# Patient Record
Sex: Female | Born: 1935
Health system: Southern US, Community
[De-identification: ages and names within clinical notes are randomized; demographics above are authoritative.]

## PROBLEM LIST (undated history)

## (undated) DIAGNOSIS — I219 Acute myocardial infarction, unspecified: Secondary | ICD-10-CM

## (undated) DIAGNOSIS — M810 Age-related osteoporosis without current pathological fracture: Secondary | ICD-10-CM

## (undated) DIAGNOSIS — J449 Chronic obstructive pulmonary disease, unspecified: Secondary | ICD-10-CM

## (undated) DIAGNOSIS — E550 Rickets, active: Secondary | ICD-10-CM

## (undated) DIAGNOSIS — R259 Unspecified abnormal involuntary movements: Secondary | ICD-10-CM

## (undated) DIAGNOSIS — G252 Other specified forms of tremor: Secondary | ICD-10-CM

## (undated) DIAGNOSIS — I10 Essential (primary) hypertension: Secondary | ICD-10-CM

## (undated) DIAGNOSIS — K222 Esophageal obstruction: Secondary | ICD-10-CM

## (undated) DIAGNOSIS — I2510 Atherosclerotic heart disease of native coronary artery without angina pectoris: Secondary | ICD-10-CM

## (undated) DIAGNOSIS — E782 Mixed hyperlipidemia: Secondary | ICD-10-CM

## (undated) DIAGNOSIS — S329XXA Fracture of unspecified parts of lumbosacral spine and pelvis, initial encounter for closed fracture: Secondary | ICD-10-CM

## (undated) DIAGNOSIS — F419 Anxiety disorder, unspecified: Secondary | ICD-10-CM

## (undated) DIAGNOSIS — K219 Gastro-esophageal reflux disease without esophagitis: Secondary | ICD-10-CM

## (undated) HISTORY — DX: Esophageal obstruction: K22.2

## (undated) HISTORY — PX: VEIN LIGATION AND STRIPPING: SHX2653

## (undated) HISTORY — DX: Chronic obstructive pulmonary disease, unspecified: J44.9

## (undated) HISTORY — PX: CORONARY ANGIOPLASTY WITH STENT PLACEMENT: SHX49

## (undated) HISTORY — DX: Essential (primary) hypertension: I10

## (undated) HISTORY — DX: Unspecified abnormal involuntary movements: R25.9

## (undated) HISTORY — DX: Fracture of unspecified parts of lumbosacral spine and pelvis, initial encounter for closed fracture: S32.9XXA

## (undated) HISTORY — DX: Atherosclerotic heart disease of native coronary artery without angina pectoris: I25.10

## (undated) HISTORY — PX: OTHER SURGICAL HISTORY: SHX169

## (undated) HISTORY — DX: Anxiety disorder, unspecified: F41.9

## (undated) HISTORY — PX: CARDIAC CATHETERIZATION: SHX172

## (undated) HISTORY — DX: Age-related osteoporosis without current pathological fracture: M81.0

## (undated) HISTORY — DX: Other specified forms of tremor: G25.2

## (undated) HISTORY — PX: APPENDECTOMY: SHX54

## (undated) HISTORY — DX: Gastro-esophageal reflux disease without esophagitis: K21.9

## (undated) HISTORY — DX: Mixed hyperlipidemia: E78.2

## (undated) HISTORY — PX: CHOLECYSTECTOMY: SHX55

---

## 1998-06-29 ENCOUNTER — Other Ambulatory Visit: Admission: RE | Admit: 1998-06-29 | Discharge: 1998-06-29 | Payer: Self-pay | Admitting: Family Medicine

## 1999-10-03 ENCOUNTER — Other Ambulatory Visit: Admission: RE | Admit: 1999-10-03 | Discharge: 1999-10-03 | Payer: Self-pay | Admitting: Family Medicine

## 1999-10-31 ENCOUNTER — Other Ambulatory Visit: Admission: RE | Admit: 1999-10-31 | Discharge: 1999-10-31 | Payer: Self-pay | Admitting: Gastroenterology

## 1999-10-31 ENCOUNTER — Encounter (INDEPENDENT_AMBULATORY_CARE_PROVIDER_SITE_OTHER): Payer: Self-pay | Admitting: Specialist

## 2001-02-16 ENCOUNTER — Other Ambulatory Visit: Admission: RE | Admit: 2001-02-16 | Discharge: 2001-02-16 | Payer: Self-pay | Admitting: Family Medicine

## 2002-02-22 ENCOUNTER — Other Ambulatory Visit: Admission: RE | Admit: 2002-02-22 | Discharge: 2002-02-22 | Payer: Self-pay | Admitting: Family Medicine

## 2002-05-09 LAB — HM MAMMOGRAPHY: HM Mammogram: 0

## 2003-03-14 ENCOUNTER — Other Ambulatory Visit: Admission: RE | Admit: 2003-03-14 | Discharge: 2003-03-14 | Payer: Self-pay | Admitting: Family Medicine

## 2004-08-13 ENCOUNTER — Other Ambulatory Visit: Admission: RE | Admit: 2004-08-13 | Discharge: 2004-08-13 | Payer: Self-pay | Admitting: Family Medicine

## 2005-02-10 ENCOUNTER — Ambulatory Visit: Payer: Self-pay | Admitting: Gastroenterology

## 2005-03-24 LAB — HM DEXA SCAN

## 2005-03-31 ENCOUNTER — Ambulatory Visit: Payer: Self-pay | Admitting: Gastroenterology

## 2005-04-22 ENCOUNTER — Ambulatory Visit: Payer: Self-pay | Admitting: Gastroenterology

## 2005-11-18 ENCOUNTER — Ambulatory Visit: Payer: Self-pay | Admitting: Gastroenterology

## 2005-12-08 ENCOUNTER — Ambulatory Visit (HOSPITAL_COMMUNITY): Admission: RE | Admit: 2005-12-08 | Discharge: 2005-12-08 | Payer: Self-pay | Admitting: Gastroenterology

## 2005-12-22 ENCOUNTER — Ambulatory Visit (HOSPITAL_COMMUNITY): Admission: RE | Admit: 2005-12-22 | Discharge: 2005-12-22 | Payer: Self-pay | Admitting: Gastroenterology

## 2006-01-12 ENCOUNTER — Ambulatory Visit: Payer: Self-pay | Admitting: Gastroenterology

## 2006-02-02 ENCOUNTER — Ambulatory Visit: Payer: Self-pay | Admitting: Gastroenterology

## 2006-09-01 DIAGNOSIS — I219 Acute myocardial infarction, unspecified: Secondary | ICD-10-CM

## 2006-09-01 HISTORY — DX: Acute myocardial infarction, unspecified: I21.9

## 2007-04-03 ENCOUNTER — Ambulatory Visit: Payer: Self-pay | Admitting: Cardiology

## 2007-04-04 ENCOUNTER — Ambulatory Visit: Payer: Self-pay | Admitting: Cardiology

## 2007-04-04 ENCOUNTER — Inpatient Hospital Stay (HOSPITAL_COMMUNITY): Admission: AD | Admit: 2007-04-04 | Discharge: 2007-04-06 | Payer: Self-pay | Admitting: Cardiology

## 2007-04-12 ENCOUNTER — Ambulatory Visit: Payer: Self-pay | Admitting: Cardiology

## 2007-04-19 ENCOUNTER — Ambulatory Visit: Payer: Self-pay | Admitting: Cardiology

## 2007-08-02 ENCOUNTER — Ambulatory Visit: Payer: Self-pay | Admitting: Cardiology

## 2007-12-08 ENCOUNTER — Ambulatory Visit: Payer: Self-pay | Admitting: Cardiology

## 2007-12-09 ENCOUNTER — Inpatient Hospital Stay (HOSPITAL_COMMUNITY): Admission: AD | Admit: 2007-12-09 | Discharge: 2007-12-10 | Payer: Self-pay | Admitting: Cardiology

## 2007-12-09 ENCOUNTER — Ambulatory Visit: Payer: Self-pay | Admitting: Cardiology

## 2007-12-31 ENCOUNTER — Ambulatory Visit: Payer: Self-pay | Admitting: Cardiology

## 2007-12-31 ENCOUNTER — Encounter: Payer: Self-pay | Admitting: Physician Assistant

## 2008-07-25 ENCOUNTER — Ambulatory Visit: Payer: Self-pay | Admitting: Cardiology

## 2008-07-25 DIAGNOSIS — I2510 Atherosclerotic heart disease of native coronary artery without angina pectoris: Secondary | ICD-10-CM

## 2008-07-25 DIAGNOSIS — E785 Hyperlipidemia, unspecified: Secondary | ICD-10-CM | POA: Insufficient documentation

## 2008-11-06 ENCOUNTER — Encounter: Payer: Self-pay | Admitting: Cardiology

## 2008-12-17 ENCOUNTER — Encounter: Payer: Self-pay | Admitting: Cardiology

## 2008-12-19 ENCOUNTER — Encounter: Payer: Self-pay | Admitting: Cardiology

## 2008-12-21 ENCOUNTER — Encounter: Payer: Self-pay | Admitting: Cardiology

## 2009-01-01 ENCOUNTER — Ambulatory Visit: Payer: Self-pay | Admitting: Cardiology

## 2009-01-12 ENCOUNTER — Encounter: Payer: Self-pay | Admitting: Cardiology

## 2009-01-21 ENCOUNTER — Encounter: Payer: Self-pay | Admitting: Cardiology

## 2009-01-22 ENCOUNTER — Encounter: Payer: Self-pay | Admitting: Cardiology

## 2009-01-24 ENCOUNTER — Encounter: Payer: Self-pay | Admitting: Cardiology

## 2009-05-26 ENCOUNTER — Encounter: Payer: Self-pay | Admitting: Cardiology

## 2009-05-27 ENCOUNTER — Encounter: Payer: Self-pay | Admitting: Cardiology

## 2009-05-28 ENCOUNTER — Encounter: Payer: Self-pay | Admitting: Cardiology

## 2009-05-30 ENCOUNTER — Encounter: Payer: Self-pay | Admitting: Cardiology

## 2009-07-12 ENCOUNTER — Ambulatory Visit: Payer: Self-pay | Admitting: Cardiology

## 2009-07-12 DIAGNOSIS — I10 Essential (primary) hypertension: Secondary | ICD-10-CM | POA: Insufficient documentation

## 2009-09-03 ENCOUNTER — Encounter: Payer: Self-pay | Admitting: Cardiology

## 2009-10-09 ENCOUNTER — Encounter (INDEPENDENT_AMBULATORY_CARE_PROVIDER_SITE_OTHER): Payer: Self-pay | Admitting: *Deleted

## 2009-11-05 ENCOUNTER — Encounter: Payer: Self-pay | Admitting: Cardiology

## 2009-11-09 ENCOUNTER — Encounter: Payer: Self-pay | Admitting: Cardiology

## 2010-01-03 ENCOUNTER — Encounter: Payer: Self-pay | Admitting: Cardiology

## 2010-01-04 ENCOUNTER — Encounter (INDEPENDENT_AMBULATORY_CARE_PROVIDER_SITE_OTHER): Payer: Self-pay | Admitting: *Deleted

## 2010-01-17 ENCOUNTER — Encounter (INDEPENDENT_AMBULATORY_CARE_PROVIDER_SITE_OTHER): Payer: Self-pay | Admitting: *Deleted

## 2010-01-17 ENCOUNTER — Ambulatory Visit: Payer: Self-pay | Admitting: Cardiology

## 2010-01-21 ENCOUNTER — Telehealth (INDEPENDENT_AMBULATORY_CARE_PROVIDER_SITE_OTHER): Payer: Self-pay | Admitting: *Deleted

## 2010-01-25 ENCOUNTER — Ambulatory Visit: Payer: Self-pay | Admitting: Cardiology

## 2010-02-01 ENCOUNTER — Encounter (INDEPENDENT_AMBULATORY_CARE_PROVIDER_SITE_OTHER): Payer: Self-pay | Admitting: *Deleted

## 2010-02-01 ENCOUNTER — Encounter: Payer: Self-pay | Admitting: Cardiology

## 2010-02-01 ENCOUNTER — Ambulatory Visit: Payer: Self-pay | Admitting: Physician Assistant

## 2010-02-01 DIAGNOSIS — R9430 Abnormal result of cardiovascular function study, unspecified: Secondary | ICD-10-CM | POA: Insufficient documentation

## 2010-02-05 ENCOUNTER — Encounter (INDEPENDENT_AMBULATORY_CARE_PROVIDER_SITE_OTHER): Payer: Self-pay | Admitting: *Deleted

## 2010-02-07 ENCOUNTER — Inpatient Hospital Stay (HOSPITAL_BASED_OUTPATIENT_CLINIC_OR_DEPARTMENT_OTHER): Admission: RE | Admit: 2010-02-07 | Discharge: 2010-02-07 | Payer: Self-pay | Admitting: Cardiovascular Disease

## 2010-02-07 ENCOUNTER — Telehealth (INDEPENDENT_AMBULATORY_CARE_PROVIDER_SITE_OTHER): Payer: Self-pay | Admitting: *Deleted

## 2010-02-07 ENCOUNTER — Ambulatory Visit: Payer: Self-pay | Admitting: Cardiovascular Disease

## 2010-02-27 ENCOUNTER — Ambulatory Visit: Payer: Self-pay | Admitting: Cardiology

## 2010-02-27 DIAGNOSIS — K219 Gastro-esophageal reflux disease without esophagitis: Secondary | ICD-10-CM

## 2010-04-03 ENCOUNTER — Telehealth (INDEPENDENT_AMBULATORY_CARE_PROVIDER_SITE_OTHER): Payer: Self-pay | Admitting: *Deleted

## 2010-05-16 ENCOUNTER — Encounter: Payer: Self-pay | Admitting: Cardiology

## 2010-05-17 ENCOUNTER — Encounter: Payer: Self-pay | Admitting: Cardiology

## 2010-05-20 ENCOUNTER — Ambulatory Visit: Payer: Self-pay | Admitting: Cardiology

## 2010-06-19 ENCOUNTER — Telehealth: Payer: Self-pay | Admitting: Gastroenterology

## 2010-06-24 ENCOUNTER — Encounter (INDEPENDENT_AMBULATORY_CARE_PROVIDER_SITE_OTHER): Payer: Self-pay | Admitting: *Deleted

## 2010-09-22 ENCOUNTER — Encounter: Payer: Self-pay | Admitting: Gastroenterology

## 2010-10-01 NOTE — Progress Notes (Signed)
Summary: PHONE:  BLOOD WORK  Phone Note Call from Patient Call back at Home Phone [phone]   Caller: self Summary of Call: Patient has appt. with Dr. Diona Browner on Sept. 19th and she is wanting to know if she needs blood work before exam. States that she normally gets it when she goes to [place] but they did not order any on her last visit. Initial call taken by: Zachary George,  April 03, 2010 2:23 PM  Follow-up for Phone Call        There is no mention in last OV note and no reminder regarding labs due before office visit. Will assess at ov with Dr. Diona Browner. Follow-up by: Cyril Loosen, RN, BSN,  April 03, 2010 4:20 PM

## 2010-10-01 NOTE — Letter (Signed)
Summary: [facility] D/C DR. VASIREDDY  [facility] D/C DR. VASIREDDY   Imported By: Zachary George 05/20/2010 10:54:23  _____________________________________________________________________  External Attachment:    Type:   Image     Comment:   External Document

## 2010-10-01 NOTE — Letter (Signed)
Summary: Cardiac Cath Instructions - [facility]  [facility] [address]   [place], [place] [address]   Phone: [phone]  Fax: [phone]     02/01/2010 MRN: [id number]  [facility] [address] [place], [place]  [address]  Dear [name],   You are scheduled for a Cardiac Catheterization on THURSDAY, February 07, 2010 with Dr. Clifton James.  Please arrive to the 1st floor of the Heart and Vascular Center at [facility] at 8:30 am on the day of your procedure. Please do not arrive before 6:30 a.m. Call the Heart and Vascular Center at [phone] if you are unable to make your appointmnet. The Code to get into the parking garage under the building is 0100. Take the elevators to the 1st floor. You must have someone to drive you home. Someone must be with you for the first 24 hours after you arrive home. Please wear clothes that are easy to get on and off and wear slip-on shoes. Do not eat or drink after midnight except water with your medications that morning. Bring all your medications and current insurance cards with you.  _X__ DO NOT take these medications before your procedure: Hold Lisinopril the morning of the test.  __X_ Make sure you take your aspirin and Plavix.  _X__ You may take all of your remaining medications with water that morning.  ___ DO NOT take ANY medications before your procedure.  ___ Pre-med instructions:  ________________________________________________________________________  The usual length of stay after your procedure is 2 to 3 hours. This can vary.  If you have any questions, please call the office at the number listed above.  Cyril Loosen, RN, BSN           Directions to the [facility]  Please Note : Park in [place] under the building not the parking deck.  From [employer]: Turn onto [address] Left onto [place] (1st stoplight) Right at the brick  entrance to the hospital (Main circle [address]) Bear to the right and you will see a blue sign "Heart and Vascular Center" Parking garage is a sharp right'to get through the gate out in the code _0100______. Once you park, take the elevator to the first floor. Please do not arrive before 0630am. The building will be dark before that time.   From [address] Turn onto [address] Turn left into the brick entrance to the hospital (Main circle drive) Bear to the right and you will see a blue sign "Heart and Vascular Center" Parking garage is a sharp right, to get thru the gate put in the code _0100___. Once you park, take the elevator to the first floor. Please do not arrive before 0630am. The building will be dark before that time

## 2010-10-01 NOTE — Letter (Signed)
Summary: External Correspondence/ FAXED PRE-CATH ORDER  External Correspondence/ FAXED PRE-CATH ORDER   Imported By: Dorise Hiss 02/11/2010 12:35:47  _____________________________________________________________________  External Attachment:    Type:   Image     Comment:   External Document

## 2010-10-01 NOTE — Assessment & Plan Note (Signed)
Summary: 6 MO FO PER MAY REMINDER-SRS   Visit Type:  Follow-up Primary Provider:  [facility]   History of Present Illness: 77 year old woman presents for followup.  She was hospitalized at [facility] back in March with a complicated urinary tract infection. She reports having problems with recurrent infection, and has been on at least 2 courses of antibiotics. She states that she is due to see her primary care physician later today.  Followup labs from March revealed BUN 15, creatinine 1.1, potassium 4.5, AST 16, ALT 8, total cholesterol 148, triglycerides 97, HDL of 61, LDL 68.  From a cardiac perspective, she does state that she has had to use a nitroglycerin recently. She describes a vague discomfort in her chest, not always exertional, but definitely more present for the last few months. Symptoms generally fairly mild, somewhat moderate at times. She reports NYHA class II dyspnea, no palpitations or syncope.  Current Medications (verified): 1)  Isosorbide Mononitrate Cr 30 Mg Xr24h-Tab (Isosorbide Mononitrate) .... Take 1 Tablet By Mouth Once A Day 2)  Lisinopril 10 Mg Tabs (Lisinopril) .... Take 1 Tablet By Mouth Once A Day 3)  Nitrostat 0.4 Mg Subl (Nitroglycerin) .... Place 1 Tablet Under Tongue As Directed 4)  Simvastatin 40 Mg Tabs (Simvastatin) .... Take 1 Tablet By Mouth At Bedtime 5)  Metoprolol Succinate 25 Mg Xr24h-Tab (Metoprolol Succinate) .... Take 1 Tablet By Mouth Once A Day 6)  Plavix 75 Mg Tabs (Clopidogrel Bisulfate) .... Take 1 Tablet By Mouth Once A Day 7)  Aspirin 325 Mg Tabs (Aspirin) .... Take 1 Tablet By Mouth Once A Day 8)  Alprazolam 0.25 Mg Tabs (Alprazolam) .... As Needed 9)  Prilosec 20 Mg Cpdr (Omeprazole) .... As Needed  Allergies (verified): 1)  ! Pcn 2)  ! Codeine  Comments:  Nurse/Medical Assistant: The patient's medications and allergies were reviewed with the patient and were updated in the Medication and Allergy Lists.  Bottle reviewed.  Past History:  Social History: Last updated: 01/17/2010 Widowed  Tobacco Use - Former Alcohol Use - no  Past Medical History: Coronary artery disease - DES LAD and BMS LAD 8/08 Dyslipidemia.  Gastroesophageal reflux disease Hypertension Anxiety disorder Resting tremor  Social History: Widowed  Tobacco Use - Former Alcohol Use - no  Review of Systems       The patient complains of chest pain.  The patient denies anorexia, fever, vision loss, syncope, dyspnea on exertion, peripheral edema, prolonged cough, headaches, hemoptysis, melena, and hematochezia.         Otherwise reviewed and negative except as outlined above.  Vital Signs:  Patient profile:   77 year old female Height:      63 inches Weight:      166 pounds Pulse rate:   52 / minute BP sitting:   150 / 72  (left arm) Cuff size:   regular  Vitals Entered By: Carlye Grippe (Jan 17, 2010 10:16 AM)  Physical Exam  Additional Exam:  Overweight woman in no acute distress. HEENT: Conjunctiva and lids normal, oropharynx clear. Neck: Supple, no elevated sugar venous pressure, no bruits. Lungs: Clear to auscultation, nonlabored. Cardiac: Regular rate and rhythm, no S3 gallop. Abdomen: Soft, nontender, no bruits. Skin: Warm and dry. Extremities: No pitting edema, distal pulses one plus. Musculoskeletal: No kyphosis. Neuropsychiatric: Alert and oriented x3, resting tremor, affect normal.   EKG  Procedure date:  01/17/2010  Findings:      Sinus bradycardia at 50 beats per minute.  Impression & Recommendations:  Problem # 1:  CORONARY ATHEROSCLEROSIS NATIVE CORONARY ARTERY (ICD-414.01)  Patient is reporting some new symptoms, specifically chest discomfort, and occasional use of nitroglycerin which is new over the last year. She is status post intervention to the LAD back in August 2008 as noted above. She was given refill for nitroglycerin, started on Imdur at 30 mg daily, and will be  scheduled for a Lexiscan Cardiolite on medical therapy to assess for residual ischemia. I will see her back in the office to discuss the results.  Her updated medication list for this problem includes:    Isosorbide Mononitrate Cr 30 Mg Xr24h-tab (Isosorbide mononitrate) .Marland Kitchen... Take 1 tablet by mouth once a day    Lisinopril 10 Mg Tabs (Lisinopril) .Marland Kitchen... Take 1 tablet by mouth once a day    Nitrostat 0.4 Mg Subl (Nitroglycerin) .Marland Kitchen... Place 1 tablet under tongue as directed    Metoprolol Succinate 25 Mg Xr24h-tab (Metoprolol succinate) .Marland Kitchen... Take 1 tablet by mouth once a day    Plavix 75 Mg Tabs (Clopidogrel bisulfate) .Marland Kitchen... Take 1 tablet by mouth once a day    Aspirin 325 Mg Tabs (Aspirin) .Marland Kitchen... Take 1 tablet by mouth once a day  Orders: EKG w/ Interpretation (93000) Nuclear Med (Nuc Med)  Problem # 2:  HYPERLIPIDEMIA (ICD-272.4)  Lipid numbers look good on present regimen.  Her updated medication list for this problem includes:    Simvastatin 40 Mg Tabs (Simvastatin) .Marland Kitchen... Take 1 tablet by mouth at bedtime  Problem # 3:  ESSENTIAL HYPERTENSION, BENIGN (ICD-401.1)  Blood pressure up some today. I asked her to keep an eye on sodium intake.  Her updated medication list for this problem includes:    Lisinopril 10 Mg Tabs (Lisinopril) .Marland Kitchen... Take 1 tablet by mouth once a day    Metoprolol Succinate 25 Mg Xr24h-tab (Metoprolol succinate) .Marland Kitchen... Take 1 tablet by mouth once a day    Aspirin 325 Mg Tabs (Aspirin) .Marland Kitchen... Take 1 tablet by mouth once a day  Patient Instructions: 1)  Follow up appt with Dr. Diona Browner on Wednesday, February 27, 2010 at 9:10am. 2)  Start Imdur (isosorbide) 30mg  by mouth once daily. 3)  Your physician has requested that you have an Best boy.  For further information please visit [email or web address]  Please follow instruction sheet, as given. Prescriptions: NITROSTAT 0.4 MG SUBL (NITROGLYCERIN) Place 1 tablet under tongue as directed  #25 x 3   Entered by:    Cyril Loosen, RN, BSN   Authorized by:   Loreli Slot, MD, [facility]   Signed by:   Cyril Loosen, RN, BSN on 01/17/2010   Method used:   Electronically to        [facility]* (retail)       [address]       [place], [place]  [address]       Ph: [phone]       Fax: [phone]   RxID:   [id number] ISOSORBIDE MONONITRATE CR 30 MG XR24H-TAB (ISOSORBIDE MONONITRATE) Take 1 tablet by mouth once a day  #30 x 6   Entered by:   Cyril Loosen, RN, BSN   Authorized by:   Loreli Slot, MD, [facility]   Signed by:   Cyril Loosen, RN, BSN on 01/17/2010   Method used:   Electronically to        [facility]* (retail)       [address]  [place], [place]  [address]       Ph: [phone]       Fax: [phone]   RxID:   [id number]

## 2010-10-01 NOTE — Progress Notes (Signed)
Summary: Left message for nurse to call  Phone Note Call from Patient Call back at Home Phone [phone]   Summary of Call: Pt left message on my voicemail at [phone] to call regarding a question for Dr. Diona Browner. Attempted to reach pt. Left message to call back on machine. Initial call taken by: Cyril Loosen, RN, BSN,  Jan 21, 2010 4:29 PM  Follow-up for Phone Call        Pt states she's on Cipro for a UTI and wants to know if this is okay to take with the stress test. Notified pt it should not interfer with her test. Pt verbalized understanding.  Follow-up by: Cyril Loosen, RN, BSN,  Jan 22, 2010 8:12 AM

## 2010-10-01 NOTE — Letter (Signed)
Summary: Lexiscan or Dobutamine Pharmacist, community at [facility]  [address]   [place], [place] [address]   Phone: [phone]  Fax: [phone]      [facility] Cardi[facility] Services  Lexiscan or Dobutamine Cardiolite Strss Test    [name]  Appointment Date:_  Appointment Time:_  Your doctor has ordered a CARDIOLITE STRESS TEST using a medication to stimulate exercise so that you will not have to walk on the treadmill to determine the condition of your heart during stress. If you take blood pressure medication, ask your doctor if you should take it the day of your test. You should not have anything to eat or drink at least 4 hours before your test is scheduled, and no caffeine, including decaffeinated tea and coffee, chocolate, and soft drinks for 24 hours before your test.  You will need to register at the Outpatient/Main Entrance at the hospital 15 minutes before your appointment time. It is a good idea to bring a copy of your order with you. They will direct you to the Diagnostic Imaging (Radiology) Department.  You will be asked to undress from the waist up and given a hospital gown to wear, so dress comfortably from the waist down for example: Sweat pants, shorts, or skirt Rubber soled lace up shoes (tennis shoes)  Plan on about three hours from registration to release from the hospital  You may take all of your medications with a sip of water the morning of your test.

## 2010-10-01 NOTE — Letter (Signed)
Summary: Colonoscopy Letter  [facility]  [address] [place], [place] [address]   Phone: [phone]  Fax: [phone]      October 09, 2009 MRN: [id number]   [facility] [address] [place], [place]  [address]   Dear Ms. Dinger,   According to your medical record, it is time for you to schedule a Colonoscopy. The American Cancer Society recommends this procedure as a method to detect early colon cancer. Patients with a family history of colon cancer, or a personal history of colon polyps or inflammatory bowel disease are at increased risk.  This letter has beeen generated based on the recommendations made at the time of your procedure. If you feel that in your particular situation this may no longer apply, please contact our office.  Please call our office at [phone] to schedule this appointment or to update your records at your earliest convenience.  Thank you for cooperating with [place] to provide you with the very best care possible.   Sincerely,  Judie Petit T. Russella Dar, M.D.  [facility] Gastroenterology Division [phone]  Appended Document: Colonoscopy Letter mailed.

## 2010-10-01 NOTE — Assessment & Plan Note (Signed)
Summary: 1 MO F/U PER 5/19 OV-JM   Visit Type:  Follow-up Primary Provider:  [facility]   History of Present Illness: 77 year old woman presents for followup. She was recently referred for a followup cardiac catheterization secondary to chest discomfort concerning for angina. Results are outlined below, and fortunately were reassuring, demonstrating no major obstructive disease and continued stent patency. Reviewed this with her today.  She continues to have occasional chest "tightness", sometimes after walking in the heat, and sometimes at night. She also has concurrent reflux symptoms. We discussed some medication adjustments to see if we can get a better handle on her symptoms.  She states that she was very pleased with her experience an outpatient cardiac catheterization lab.  Preventive Screening-Counseling & Management  Alcohol-Tobacco     Smoking Status: quit     Year Quit: 1990  Current Medications (verified): 1)  Isosorbide Mononitrate Cr 30 Mg Xr24h-Tab (Isosorbide Mononitrate) .... Take 1 Tablet By Mouth Once A Day 2)  Lisinopril 10 Mg Tabs (Lisinopril) .... Take 1 Tablet By Mouth Once A Day 3)  Nitrostat 0.4 Mg Subl (Nitroglycerin) .... Place 1 Tablet Under Tongue As Directed 4)  Simvastatin 40 Mg Tabs (Simvastatin) .... Take 1 Tablet By Mouth At Bedtime 5)  Metoprolol Succinate 25 Mg Xr24h-Tab (Metoprolol Succinate) .... Take 1 Tablet By Mouth Once A Day 6)  Plavix 75 Mg Tabs (Clopidogrel Bisulfate) .... Take 1 Tablet By Mouth Once A Day 7)  Aspirin 325 Mg Tabs (Aspirin) .... Take 1 Tablet By Mouth Once A Day 8)  Alprazolam 0.25 Mg Tabs (Alprazolam) .... As Needed 9)  Prilosec 20 Mg Cpdr (Omeprazole) .... As Needed  Allergies (verified): 1)  ! Pcn 2)  ! Codeine  Comments:  Nurse/Medical Assistant: The patient's medication list and allergies were reviewed with the patient and were updated in the Medication and Allergy Lists.  Past  History:  Past Medical History: Last updated: 01/17/2010 Coronary artery disease - DES LAD and BMS LAD 8/08 Dyslipidemia.  Gastroesophageal reflux disease Hypertension Anxiety disorder Resting tremor  Social History: Last updated: 01/17/2010 Widowed  Tobacco Use - Former Alcohol Use - no  Review of Systems       The patient complains of chest pain.  The patient denies anorexia, fever, syncope, dyspnea on exertion, prolonged cough, hemoptysis, abdominal pain, melena, hematochezia, and severe indigestion/heartburn.         Otherwise reviewed and negative except as outlined.  Vital Signs:  Patient profile:   77 year old female Height:      63 inches Weight:      169 pounds Pulse rate:   64 / minute BP sitting:   125 / 74  (left arm) Cuff size:   large  Vitals Entered By: Carlye Grippe (February 27, 2010 8:52 AM)  Physical Exam  Additional Exam:  Overweight woman in no acute distress. HEENT: Conjunctiva and lids normal, oropharynx clear. Neck: Supple, no elevated sugar venous pressure, no bruits. Lungs: Clear to auscultation, nonlabored. Cardiac: Regular rate and rhythm, no S3 gallop. Abdomen: Soft, nontender, no bruits. Skin: Warm and dry. Extremities: No pitting edema, distal pulses one plus. Musculoskeletal: No kyphosis. Neuropsychiatric: Alert and oriented x3, resting tremor, affect normal.   Cardiac Cath  Procedure date:  02/07/2010  Findings:      HEMODYNAMIC FINDINGS:  Central aortic pressure 140/55.  Left ventricular   pressure 136/8.  Left ventricular end-diastolic pressure 19.      ANGIOGRAPHIC FINDINGS:   1. The  left main coronary artery had no evidence of disease.   2. The left anterior descending was a large vessel that coursed to the       apex.  There was a stent in the proximal to midportion of the       vessel that was widely patent and the distal portion of the vessel       that was widely patent.  There was a moderate-sized diagonal branch        that appears to have 40% stenosis.  There were no flow-limiting       lesions in this vessel.   3. The circumflex artery was a moderate-sized vessel that gives off 2       very small obtuse marginal branches and a moderate-sized third       obtuse marginal branch.  There are several areas in the mid vessel       that have 40% stenosis.   4. The right coronary artery was a large dominant vessel that appears       to have 40% stenosis in the midportion of vessel.  There were no       flow-limiting lesions in this vessel.   5. Left ventricular angiogram was performed in the RAO projection.  It       showed normal left ventricular systolic function with ejection       fraction of 55-60%.  Impression & Recommendations:  Problem # 1:  CORONARY ATHEROSCLEROSIS NATIVE CORONARY ARTERY (ICD-414.01)  Recent cardiac catheterization reviewed, and reassuring, with stent patency, and no recurrent major obstructive stenoses. No indication for revascularization. In light of symptoms of probable angina, will continue to modify medical therapy. Imdur will be increased to b.i.d. dosing. She will continue on dual antiplatelet therapy. I will see her back over the next 3 months.  Her updated medication list for this problem includes:    Isosorbide Mononitrate Cr 30 Mg Xr24h-tab (Isosorbide mononitrate) .Marland Kitchen... Take 1 tablet by mouth two times a day    Lisinopril 10 Mg Tabs (Lisinopril) .Marland Kitchen... Take 1 tablet by mouth once a day    Nitrostat 0.4 Mg Subl (Nitroglycerin) .Marland Kitchen... Place 1 tablet under tongue as directed    Metoprolol Succinate 25 Mg Xr24h-tab (Metoprolol succinate) .Marland Kitchen... Take 1 tablet by mouth once a day    Plavix 75 Mg Tabs (Clopidogrel bisulfate) .Marland Kitchen... Take 1 tablet by mouth once a day    Aspirin 325 Mg Tabs (Aspirin) .Marland Kitchen... Take 1 tablet by mouth once a day  Problem # 2:  ESSENTIAL HYPERTENSION, BENIGN (ICD-401.1)  Blood pressure well controlled.  Her updated medication list for this problem  includes:    Lisinopril 10 Mg Tabs (Lisinopril) .Marland Kitchen... Take 1 tablet by mouth once a day    Metoprolol Succinate 25 Mg Xr24h-tab (Metoprolol succinate) .Marland Kitchen... Take 1 tablet by mouth once a day    Aspirin 325 Mg Tabs (Aspirin) .Marland Kitchen... Take 1 tablet by mouth once a day  Problem # 3:  GERD (ICD-530.81)  May also be contributing to some of her symptoms. I encouraged her to try her proton pump inhibitor more regularly.  Her updated medication list for this problem includes:    Prilosec 20 Mg Cpdr (Omeprazole) .Marland Kitchen... As needed  Patient Instructions: 1)  Your physician wants you to follow-up in: 3 months. You will receive a reminder letter in the mail one-two months in advance. If you don't receive a letter, please call our office to schedule the follow-up appointment.  2)  Increase Imdur (isosorbide mononitrate) to 30mg  by mouth two times a day. A new prescription was sen to your pharmacy to reflect this change.  Prescriptions: ISOSORBIDE MONONITRATE CR 30 MG XR24H-TAB (ISOSORBIDE MONONITRATE) Take 1 tablet by mouth two times a day  #60 x 6   Entered by:   Cyril Loosen, RN, BSN   Authorized by:   Loreli Slot, MD, [facility]   Signed by:   Cyril Loosen, RN, BSN on 02/27/2010   Method used:   Electronically to        [facility]* (retail)       [address]       [place], [place]  [address]       Ph: [phone]       Fax: [phone]   RxID:   [id number]

## 2010-10-01 NOTE — Letter (Signed)
Summary: New Patient letter  [facility]  [address] [place], [place] [address]   Phone: [phone]  Fax: [phone]       06/24/2010 MRN: [id number]  [facility] [address] [place], [place]  [address]  Dear Ms. Ohnemus,  Welcome to the Gastroenterology Division at [facility].    You are scheduled to see Dr. Russella Dar on 08/05/2010 at 2:30PM on the 3rd floor at [facility], [address]  We ask that you try to arrive at our office 15 minutes prior to your appointment time to allow for check-in.  We would like you to complete the enclosed self-administered evaluation form prior to your visit and bring it with you on the day of your appointment.  We will review it with you.  Also, please bring a complete list of all your medications or, if you prefer, bring the medication bottles and we will list them.  Please bring your insurance card so that we may make a copy of it.  If your insurance requires a referral to see a specialist, please bring your referral form from your primary care physician.  Co-payments are due at the time of your visit and may be paid by cash, check or credit card.     Your office visit will consist of a consult with your physician (includes a physical exam), any laboratory testing he/she may order, scheduling of any necessary diagnostic testing (e.g. x-ray, ultrasound, CT-scan), and scheduling of a procedure (e.g. Endoscopy, Colonoscopy) if required.  Please allow enough time on your schedule to allow for any/all of these possibilities.    If you cannot keep your appointment, please call [phone] to cancel or reschedule prior to your appointment date.  This allows [place] the opportunity to schedule an appointment for another patient in need of care.  If you do not cancel or reschedule by 5 p.m. the business day prior to your appointment date, you will be charged a $50.00 late cancellation/no-show fee.    Thank you for choosing   Gastroenterology for your medical needs.  We appreciate the opportunity to care for you.  Please visit [place] at our website  to learn more about our practice.                     Sincerely,                                                             The Gastroenterology Division

## 2010-10-01 NOTE — Assessment & Plan Note (Signed)
Summary: FOLLOW UP CARDIOLITE, CHEST TIGHTNESS-JM   Visit Type:  Follow-up Primary Provider:  [facility]   History of Present Illness: 77 year old woman presents for followup. I referred her for a Cardiolite study following our last visit in mid May secondary to increased anginal chest pain. She was placed on empiric Imdur in addition to her regular medications. The Cardiolite study is reviewed below.  She denies any improvement in her chest pain symptoms.  She does use sublingual nitroglycerin with improvement. Her blood pressure and heart rate are well controlled on the present antianginal regimen, not leaving much room for further up titration.  I reviewed the stress test results with the patient and her granddaughter present. The implication is that she has had progressive atherosclerosis within the LAD distribution most likely, based on her history and prior stent placement in the LAD back in 2008.  We discussed options for management, including a repeat diagnostic cardiac catheterization to assess for potential revascularization options. After reviewing the risks and benefits, she is in agreement to proceed with this next week.  Preventive Screening-Counseling & Management  Alcohol-Tobacco     Smoking Status: quit     Year Quit: 1990  Current Medications (verified): 1)  Isosorbide Mononitrate Cr 30 Mg Xr24h-Tab (Isosorbide Mononitrate) .... Take 1 Tablet By Mouth Once A Day 2)  Lisinopril 10 Mg Tabs (Lisinopril) .... Take 1 Tablet By Mouth Once A Day 3)  Nitrostat 0.4 Mg Subl (Nitroglycerin) .... Place 1 Tablet Under Tongue As Directed 4)  Simvastatin 40 Mg Tabs (Simvastatin) .... Take 1 Tablet By Mouth At Bedtime 5)  Metoprolol Succinate 25 Mg Xr24h-Tab (Metoprolol Succinate) .... Take 1 Tablet By Mouth Once A Day 6)  Plavix 75 Mg Tabs (Clopidogrel Bisulfate) .... Take 1 Tablet By Mouth Once A Day 7)  Aspirin 325 Mg Tabs (Aspirin) .... Take 1 Tablet By  Mouth Once A Day 8)  Alprazolam 0.25 Mg Tabs (Alprazolam) .... As Needed 9)  Prilosec 20 Mg Cpdr (Omeprazole) .... As Needed  Allergies (verified): 1)  ! Pcn 2)  ! Codeine  Comments:  Nurse/Medical Assistant: The patient's medications list and allergies were reviewed with the patient and were updated in the Medication and Allergy Lists.  Past History:  Past Medical History: Last updated: 01/17/2010 Coronary artery disease - DES LAD and BMS LAD 8/08 Dyslipidemia.  Gastroesophageal reflux disease Hypertension Anxiety disorder Resting tremor  Past Surgical History: Last updated: 07/25/2008 Appendectomy Cholecystectomy Vein stripping  Family History: Last updated: 07/25/2008 No premature CAD  Social History: Last updated: 01/17/2010 Widowed  Tobacco Use - Former Alcohol Use - no   Review of Systems       The patient complains of chest pain and dyspnea on exertion.  The patient denies anorexia, fever, weight loss, syncope, peripheral edema, prolonged cough, headaches, melena, hematochezia, and severe indigestion/heartburn.         Otherwise reviewed and negative except as outlined.  Vital Signs:  Patient profile:   77 year old female Height:      63 inches Weight:      170 pounds Pulse rate:   53 / minute BP sitting:   114 / 59  (left arm) Cuff size:   large  Vitals Entered By: Carlye Grippe (February 01, 2010 2:34 PM)  Physical Exam  Additional Exam:  Overweight woman in no acute distress. HEENT: Conjunctiva and lids normal, oropharynx clear. Neck: Supple, no elevated sugar venous pressure, no bruits. Lungs: Clear to auscultation, nonlabored.  Cardiac: Regular rate and rhythm, no S3 gallop. Abdomen: Soft, nontender, no bruits. Skin: Warm and dry. Extremities: No pitting edema, distal pulses one plus. Musculoskeletal: No kyphosis. Neuropsychiatric: Alert and oriented x3, resting tremor, affect normal.   Nuclear Study  Procedure date:   01/25/2010  Findings:      Lexa scan Cardiolite with no diagnostic ST segment changes or chest pain. Mild anteroapical ischemia is possible in the setting of variable breast attenuation. LVEF 68% with TID ratio 1.12.  Impression & Recommendations:  Problem # 1:  CORONARY ATHEROSCLEROSIS NATIVE CORONARY ARTERY (ICD-414.01)  History of CAD status post drug-eluting stent placement and bare-metal stent placement to the LAD back in August 2008. Patient is describing progressive angina on good medical therapy, with good control of heart rate and blood pressure. Recent followup stress testing was abnormal, demonstrating possible anteroapical ischemia, and therefore potential progression of atherosclerosis or in-stent restenosis within the LAD. After discussing the risks and benefits, we are planning a diagnostic cardiac catheterization to best define her coronary anatomy, and to assess for potential revascularization options. This will be scheduled next week on Thursday at her request, preceded by baseline labs. If symptoms escalate in the meanwhile, she should be evaluated more urgently.  Her updated medication list for this problem includes:    Isosorbide Mononitrate Cr 30 Mg Xr24h-tab (Isosorbide mononitrate) .Marland Kitchen... Take 1 tablet by mouth once a day    Lisinopril 10 Mg Tabs (Lisinopril) .Marland Kitchen... Take 1 tablet by mouth once a day    Nitrostat 0.4 Mg Subl (Nitroglycerin) .Marland Kitchen... Place 1 tablet under tongue as directed    Metoprolol Succinate 25 Mg Xr24h-tab (Metoprolol succinate) .Marland Kitchen... Take 1 tablet by mouth once a day    Plavix 75 Mg Tabs (Clopidogrel bisulfate) .Marland Kitchen... Take 1 tablet by mouth once a day    Aspirin 325 Mg Tabs (Aspirin) .Marland Kitchen... Take 1 tablet by mouth once a day  Orders: Cardiac Catheterization (Cardiac Cath) T-Basic Metabolic Panel ([id number]) T-CBC No Diff ([id number]) T-Protime, Auto ([id number]) T-PTT ([id number])  Problem # 2:  CARDIOVASCULAR FUNCTION STUDY, ABNORMAL  (ICD-794.30)  As noted above, recent Lexiscan Cardiolite was abnormal in the setting of variable breast attenuation, revealing possible anteroapical ischemia, although preserved LVEF. This will be assessed further as described.  Problem # 3:  ESSENTIAL HYPERTENSION, BENIGN (ICD-401.1)  Blood pressure well controlled.  Her updated medication list for this problem includes:    Lisinopril 10 Mg Tabs (Lisinopril) .Marland Kitchen... Take 1 tablet by mouth once a day    Metoprolol Succinate 25 Mg Xr24h-tab (Metoprolol succinate) .Marland Kitchen... Take 1 tablet by mouth once a day    Aspirin 325 Mg Tabs (Aspirin) .Marland Kitchen... Take 1 tablet by mouth once a day  Orders: T-Basic Metabolic Panel [id number]) T-CBC No Diff ([id number]) T-Protime, Auto ([id number]) T-PTT ([id number])  Problem # 4:  HYPERLIPIDEMIA (ICD-272.4)  Patient continues on statin therapy.  Her updated medication list for this problem includes:    Simvastatin 40 Mg Tabs (Simvastatin) .Marland Kitchen... Take 1 tablet by mouth at bedtime  Patient Instructions: 1)  Your physician has requested that you have a cardiac catheterization.  Cardiac catheterization is used to diagnose and/or treat various heart conditions. Doctors may recommend this procedure for a number of different reasons. The most common reason is to evaluate chest pain. Chest pain can be a symptom of coronary artery disease (CAD), and cardiac catheterization can show whether plaque is narrowing or blocking your heart's arteries. This procedure is also used to evaluate the valves, as well  as measure the blood flow and oxygen levels in different parts of your heart.  For further information please visit [email or web address]  Please follow instruction sheet, as given. 2)  Your physician recommends that you go to the [facility] for lab work: DO TODAY, IF POSSIBLE.

## 2010-10-01 NOTE — Progress Notes (Signed)
Summary: Schedule Colonoscopy  Phone Note Outgoing Call Call back at Home Phone [phone]   Call placed by: Harlow Mares CMA Duncan Dull),  June 19, 2010 2:29 PM Call placed to: Patient Summary of Call: patient is aware that she is due for her colonoscopy but she would like our office to remail her the recall letter from feb and she will call back to schedule later. I have remailed the letter. Initial call taken by: Harlow Mares CMA (AAMA),  June 19, 2010 2:30 PM

## 2010-10-01 NOTE — Letter (Signed)
Summary: Engineer, materials at [facility]  [address]   [place], [place] [address]   Phone: [phone]  Fax: [phone]        February 05, 2010 MRN: [id number]    [facility] [address] [place], [place]  [address]    Dear [name],  Your test ordered by Selena Batten has been reviewed by your physician (or physician assistant) and was found to be normal or stable. Your physician (or physician assistant) felt no changes were needed at this time.  ____ Echocardiogram  ____ Cardiac Stress Test  __X__ Lab Work  ____ Peripheral vascular study of arms, legs or neck  ____ CT scan or X-ray  ____ Lung or Breathing test  ____ Other:   Thank you.   Cyril Loosen, RN, BSN    Duane Boston, M.D., F.A.C.C. Thressa Sheller, M.D., F.A.C.C. Oneal Grout, M.D., F.A.C.C. Cheree Ditto, M.D., F.A.C.C. Daiva Nakayama, M.D., F.A.C.C. Kenney Houseman, M.D., F.A.C.C. Jeanne Ivan, PA-C

## 2010-10-01 NOTE — Progress Notes (Signed)
Summary: question regarding med  Phone Note Call from Patient Call back at [facility] Phone [phone]   Summary of Call: Pt states she had cath done today and they didn't find anything wrong. She states they were the nicest people in the cath lab. Pt wants to know if she can take Lisinopril that she was told to hold the day of cath. Notified pt she can take this medication. Initial call taken by: Cyril Loosen, RN, BSN,  February 07, 2010 5:00 PM

## 2010-10-01 NOTE — Assessment & Plan Note (Signed)
Summary: 3 month fu-recv reminder   Visit Type:  Follow-up Primary [name]:  [name]   History of Present Illness: 77 year old woman presents for followup. She was seen back in June in followup of a cardiac catheterization.  Records indicate that the patient was admitted to [facility] last week with atypical chest pain. She ruled out for myocardial infarction with peak troponin I level of 0.01. Discharge summary indicates possible musculoskeletal etiology and also gastroesophageal reflux disease. She was treated with a combination of Protonix, Naprosyn, and Flexeril.  She describes feelings of "bloating" as well as "burning" in her throat at times associated with "belching" and left to posterior thoracic discomfort. Very atypical for angina. She has been on the new medicines for only a few days.  Reports no scheduled followup with her primary care [name].    Clinical Review Panels:  Cardiac Imaging Cardiac Cath Findings HEMODYNAMIC FINDINGS:  Central aortic pressure 140/55.  Left ventricular   pressure 136/8.  Left ventricular end-diastolic pressure 19.      ANGIOGRAPHIC FINDINGS:   1. The left main coronary artery had no evidence of disease.   2. The left anterior descending was a large vessel that coursed to the       apex.  There was a stent in the proximal to midportion of the       vessel that was widely patent and the distal portion of the vessel       that was widely patent.  There was a moderate-sized diagonal branch       that appears to have 40% stenosis.  There were no flow-limiting       lesions in this vessel.   3. The circumflex artery was a moderate-sized vessel that gives off 2       very small obtuse marginal branches and a moderate-sized third       obtuse marginal branch.  There are several areas in the mid vessel       that have 40% stenosis.   4. The right coronary artery was a large dominant vessel that appears       to have 40% stenosis in the midportion of vessel.  There were no       flow-limiting lesions in this vessel.   5. Left ventricular angiogram was performed in the RAO projection.  It       showed normal left ventricular systolic function with ejection       fraction of 55-60%. (02/07/2010)    Preventive Screening-Counseling & Management  Alcohol-Tobacco     Smoking Status: quit     Year Quit: 1980  Current Medications (verified): 1)  Isosorbide Mononitrate Cr 30 Mg Xr24h-Tab (Isosorbide Mononitrate) .... Take 1 Tablet By Mouth Two Times A Day 2)  Lisinopril 10 Mg Tabs (Lisinopril) .... Take 1 Tablet By Mouth Once A Day 3)  Nitrostat 0.4 Mg Subl (Nitroglycerin) .... Place 1 Tablet Under Tongue As Directed 4)  Simvastatin 40 Mg Tabs (Simvastatin) .... Take 1 Tablet By Mouth At Bedtime 5)  Metoprolol Succinate 25 Mg Xr24h-Tab (Metoprolol Succinate) .... Take 1 Tablet By Mouth Once A Day 6)  Plavix 75 Mg Tabs (Clopidogrel Bisulfate) .... Take 1 Tablet By Mouth Once A Day 7)  Aspirin 325 Mg Tabs (Aspirin) .... Take 1 Tablet By Mouth Once A Day 8)  Alprazolam 0.25 Mg Tabs (Alprazolam) .... As Needed 9)  Pantoprazole Sodium 40 Mg Tbec (Pantoprazole Sodium) .... Take 1 Tablet By Mouth  Two Times A Day X's 2 Weeks 10)  Naproxen 500 Mg Tabs (Naproxen) .... Take 1 Tablet By Mouth Two Times A Day For 5 Days 11)  Cyclobenzaprine Hcl 10 Mg Tabs (Cyclobenzaprine Hcl) .... Take 1 Tablet By Mouth Three Times A Day  Allergies: 1)  ! Pcn 2)  ! Codeine  Comments:  Nurse/Medical Assistant: The patient's medication bottles and allergies were reviewed with the patient and were updated in the Medication and Allergy Lists.  Past History:  Past Medical History: Last updated: 01/17/2010 Coronary artery disease - DES LAD and BMS LAD 8/08 Dyslipidemia.  Gastroesophageal reflux disease Hypertension Anxiety disorder Resting tremor  Past Surgical History: Last updated:  07/25/2008 Appendectomy Cholecystectomy Vein stripping  Social History: Last updated: 01/17/2010 Widowed  Tobacco Use - Former Alcohol Use - no  Review of Systems  The patient denies anorexia, fever, syncope, dyspnea on exertion, peripheral edema, prolonged cough, melena, and hematochezia.         Otherwise reviewed and negative except as outlined above.  Vital Signs:  Patient profile:   77 year old female Height:      63 inches Weight:      171 pounds BMI:     30.40 Pulse rate:   85 / minute BP sitting:   104 / 68  (left arm) Cuff size:   regular  Vitals Entered By: Carlye Grippe (May 20, 2010 1:00 PM)  Nutrition Counseling: Patient's BMI is greater than 25 and therefore counseled on weight management options.  Physical Exam  Additional Exam:  Overweight woman in no acute distress. HEENT: Conjunctiva and lids normal, oropharynx clear. Neck: Supple, no elevated jugular venous pressure, no bruits. Lungs: Clear to auscultation, nonlabored. Cardiac: Regular rate and rhythm, no S3 gallop. Abdomen: Soft, nontender, no bruits. Skin: Warm and dry. Extremities: No pitting edema, distal pulses one plus. Musculoskeletal: No kyphosis. Neuropsychiatric: Alert and oriented x3, resting tremor, affect normal.   EKG  Procedure date:  05/16/2010  Findings:      Normal sinus rhythm at 64 beats per minute without acute ST-T wave changes.  Impression & Recommendations:  Problem # 1:  CORONARY ATHEROSCLEROSIS NATIVE CORONARY ARTERY (ICD-414.01)  Stable status post recent cardiac catheterization as detailed above. Anticipate continued medical therapy and observation. Followup visit in 6 months.  Her updated medication list for this problem includes:    Isosorbide Mononitrate Cr 30 Mg Xr24h-tab (Isosorbide mononitrate) .Marland Kitchen... Take 1 tablet by mouth two times a day    Lisinopril 10 Mg Tabs (Lisinopril) .Marland Kitchen... Take 1 tablet by mouth once a day    Nitrostat 0.4 Mg Subl  (Nitroglycerin) .Marland Kitchen... Place 1 tablet under tongue as directed    Metoprolol Succinate 25 Mg Xr24h-tab (Metoprolol succinate) .Marland Kitchen... Take 1 tablet by mouth once a day    Plavix 75 Mg Tabs (Clopidogrel bisulfate) .Marland Kitchen... Take 1 tablet by mouth once a day    Aspirin 325 Mg Tabs (Aspirin) .Marland Kitchen... Take 1 tablet by mouth once a day  Problem # 2:  GERD (ICD-530.81)  Presumptive diagnosis based on recent hospital observation at [facility]. Records were reviewed. She was placed on a proton pump inhibitor. Also given medicines for possible musculoskeletal thoracic discomfort. I had our nursing contact [facility] to arrange a followup visit within the next 2 weeks. She may need further evaluation.  Her updated medication list for this problem includes:    Pantoprazole Sodium 40 Mg Tbec (Pantoprazole sodium) .Marland Kitchen... Take 1 tablet by mouth two times a  day x's 2 weeks  Problem # 3:  ESSENTIAL HYPERTENSION, BENIGN (ICD-401.1)  Blood pressure well controlled.  Her updated medication list for this problem includes:    Lisinopril 10 Mg Tabs (Lisinopril) .Marland Kitchen... Take 1 tablet by mouth once a day    Metoprolol Succinate 25 Mg Xr24h-tab (Metoprolol succinate) .Marland Kitchen... Take 1 tablet by mouth once a day    Aspirin 325 Mg Tabs (Aspirin) .Marland Kitchen... Take 1 tablet by mouth once a day  Patient Instructions: 1)  Your physician recommends that you continue on your current medications as directed. Please refer to the Current Medication list given to you today. 2)  PMD - Wednesday, 9/21 at 9:30 Helene Kelp, PA) 3)  Follow up in  6 months

## 2010-10-01 NOTE — Letter (Signed)
Summary: Engineer, materials at [facility]  [address]   [place], [place] [address]   Phone: [phone]  Fax: [phone]        Jan 04, 2010 MRN: [id number]    [facility] [address] [place], [place]  [address]    Dear Ms. Sitton,  Your test ordered by Selena Batten has been reviewed by your physician (or physician assistant) and was found to be normal or stable. Your physician (or physician assistant) felt no changes were needed at this time.  ____ Echocardiogram  ____ Cardiac Stress Test  __X__ Lab Work Per Dr. Diona Browner- Lipids (cholesterol) and liver function look good overall. Continue current regimen.  ____ Peripheral vascular study of arms, legs or neck  ____ CT scan or X-ray  ____ Lung or Breathing test  ____ Other:   Thank you.   Cyril Loosen, RN, BSN    Duane Boston, M.D., F.A.C.C. Thressa Sheller, M.D., F.A.C.C. Oneal Grout, M.D., F.A.C.C. Cheree Ditto, M.D., F.A.C.C. Daiva Nakayama, M.D., F.A.C.C. Kenney Houseman, M.D., F.A.C.C. Jeanne Ivan, PA-C

## 2010-10-01 NOTE — Letter (Signed)
Summary: [facility] D/C DR. Orvan Falconer  [facility] D[facility]C DR. Orvan Falconer   Imported By: Zachary George 01/16/2010 16:55:22  _____________________________________________________________________  External Attachment:    Type:   Image     Comment:   External Document

## 2010-11-05 ENCOUNTER — Encounter: Payer: Self-pay | Admitting: Cardiology

## 2010-11-06 ENCOUNTER — Encounter: Payer: Self-pay | Admitting: Cardiology

## 2010-11-08 ENCOUNTER — Encounter: Payer: Self-pay | Admitting: Cardiology

## 2010-11-08 ENCOUNTER — Ambulatory Visit: Payer: Self-pay | Admitting: Cardiology

## 2010-11-18 ENCOUNTER — Encounter: Payer: Self-pay | Admitting: Cardiology

## 2010-11-18 ENCOUNTER — Ambulatory Visit (INDEPENDENT_AMBULATORY_CARE_PROVIDER_SITE_OTHER): Payer: MEDICARE | Admitting: Cardiology

## 2010-11-18 DIAGNOSIS — K219 Gastro-esophageal reflux disease without esophagitis: Secondary | ICD-10-CM

## 2010-11-18 DIAGNOSIS — I2510 Atherosclerotic heart disease of native coronary artery without angina pectoris: Secondary | ICD-10-CM

## 2010-11-19 NOTE — Medication Information (Signed)
Summary: [facility] D/C MEDICATION SHEET ORDER  [facility] D/C MEDICATION SHEET ORDER   Imported By: Zachary George 11/15/2010 16:53:32  _____________________________________________________________________  External Attachment:    Type:   Image     Comment:   External Document

## 2010-11-25 ENCOUNTER — Telehealth: Payer: Self-pay | Admitting: *Deleted

## 2010-11-25 ENCOUNTER — Encounter: Payer: Self-pay | Admitting: Cardiology

## 2010-11-25 NOTE — Telephone Encounter (Signed)
Pt called stating she was d/c'd from [facility] on Pantoprazole. She feels this medication is making her really shaky. She wants to know if she should continue to take this med. She states she has an appt w/her primary MD today. She will hold this medication until she can discuss this further w/him. Pt verbalized understanding.

## 2010-11-28 NOTE — Assessment & Plan Note (Signed)
Summary: 6 MO FU [facility] 3/9   Visit Type:  Follow-up Primary Provider:  [facility]   History of Present Illness: 77 year old woman presents for followup. She was seen in September 2011. Recently she was admitted to [facility] with pyelonephritis. She states that she still feels "drained" and weak at times, also occasional "indigestion." She described a "burning" in her chest, has taken nitroglycerin for this on occasion however. She is not on any antacids.  Fax copy of ECG from 7 March showed sinus rhythm, no acute ST segment changes. Recent lab work shows BUN 14, creatinine 0.9, sodium 139, potassium 3.7, normal troponin I levels.  She reports a followup visit pending later in the week with her primary care provider.  Preventive Screening-Counseling & Management  Alcohol-Tobacco     Smoking Status: quit     Year Quit: 1980  Current Medications (verified): 1)  Isosorbide Mononitrate Cr 30 Mg Xr24h-Tab (Isosorbide Mononitrate) .... Take 1 Tablet By Mouth Two Times A Day 2)  Lisinopril 10 Mg Tabs (Lisinopril) .... Take 1 Tablet By Mouth Once A Day 3)  Nitrostat 0.4 Mg Subl (Nitroglycerin) .... Place 1 Tablet Under Tongue As Directed 4)  Simvastatin 40 Mg Tabs (Simvastatin) .... Take 1 Tablet By Mouth At Bedtime 5)  Metoprolol Succinate 25 Mg Xr24h-Tab (Metoprolol Succinate) .... Take 1 Tablet By Mouth Once A Day 6)  Plavix 75 Mg Tabs (Clopidogrel Bisulfate) .... Take 1 Tablet By Mouth Once A Day 7)  Aspirin 325 Mg Tabs (Aspirin) .... Take 1 Tablet By Mouth Once A Day 8)  Alprazolam 0.25 Mg Tabs (Alprazolam) .... As Needed  Allergies (verified): 1)  ! Pcn 2)  ! Codeine  Comments:  Nurse/Medical Assistant: The patient's medication bottles and allergies were reviewed with the patient and were updated in the Medication and Allergy Lists.  Past History:  Past Medical History: Last updated: 01/17/2010 Coronary artery disease - DES LAD and BMS LAD  8/08 Dyslipidemia.  Gastroesophageal reflux disease Hypertension Anxiety disorder Resting tremor  Social History: Last updated: 01/17/2010 Widowed  Tobacco Use - Former Alcohol Use - no  Review of Systems       The patient complains of chest pain.  The patient denies anorexia, fever, weight gain, syncope, dyspnea on exertion, peripheral edema, abdominal pain, melena, and hematochezia.         Otherwise reviewed and negative except as outlined.  Vital Signs:  Patient profile:   76 year old female Height:      63 inches Weight:      158 pounds O2 Sat:      98 % on Room air Pulse rate:   56 / minute BP sitting:   120 / 76  (left arm) Cuff size:   regular  Vitals Entered By: Carlye Grippe (November 18, 2010 10:22 AM)  O2 Flow:  Room air  Physical Exam  Additional Exam:  Overweight woman in no acute distress. HEENT: Conjunctiva and lids normal, oropharynx clear. Neck: Supple, no elevated jugular venous pressure, no bruits. Lungs: Clear to auscultation, nonlabored. Cardiac: Regular rate and rhythm, no S3 gallop. Abdomen: Soft, nontender, no bruits. Skin: Warm and dry. Extremities: No pitting edema, distal pulses one plus. Musculoskeletal: No kyphosis. Neuropsychiatric: Alert and oriented x3, resting tremor, affect normal.   Impression & Recommendations:  Problem # 1:  GERD (ICD-530.81)  Patient reporting chest pain, some features suggestive of indigestion. This has been a problem for her in the past. We will provide a prescription  for a proton pump inhibitor to see if this helps with her symptoms.  Her updated medication list for this problem includes:    Protonix 40 Mg Tbec (Pantoprazole sodium) .Marland Kitchen... Take 1 tablet by mouth once a day  Problem # 2:  CORONARY ATHEROSCLEROSIS NATIVE CORONARY ARTERY (ICD-414.01)  Plan to continue medical therapy and observation at this point, unless symptoms worsen. Recent hospitalization with pyelonephritis was reviewed, including  reassuring ECG and cardiac markers. Visit scheduled for the next 6 weeks.  Her updated medication list for this problem includes:    Isosorbide Mononitrate Cr 30 Mg Xr24h-tab (Isosorbide mononitrate) .Marland Kitchen... Take 1 tablet by mouth two times a day    Lisinopril 10 Mg Tabs (Lisinopril) .Marland Kitchen... Take 1 tablet by mouth once a day    Nitrostat 0.4 Mg Subl (Nitroglycerin) .Marland Kitchen... Place 1 tablet under tongue as directed    Metoprolol Succinate 25 Mg Xr24h-tab (Metoprolol succinate) .Marland Kitchen... Take 1 tablet by mouth once a day    Plavix 75 Mg Tabs (Clopidogrel bisulfate) .Marland Kitchen... Take 1 tablet by mouth once a day    Aspirin 325 Mg Tabs (Aspirin) .Marland Kitchen... Take 1 tablet by mouth once a day  Patient Instructions: 1)  Follow up with Dr. Diona Browner on  Tuesday, Dec 31, 2010 at 9:20am. 2)  Start Protonix 40mg  by mouth once daily. Prescriptions: NITROSTAT 0.4 MG SUBL (NITROGLYCERIN) Place 1 tablet under tongue as directed  #25 x 3   Entered by:   Cyril Loosen, RN, BSN   Authorized by:   Loreli Slot, MD, [facility]   Signed by:   Cyril Loosen, RN, BSN on 11/18/2010   Method used:   Electronically to        [facility]* (retail)       [address]       [place], [place]  [address]       Ph: [phone]       Fax: [phone]   RxID:   [id number] PROTONIX 40 MG TBEC (PANTOPRAZOLE SODIUM) Take 1 tablet by mouth once a day  #30 x 3   Entered by:   Cyril Loosen, RN, BSN   Authorized by:   Loreli Slot, MD, [facility]   Signed by:   Cyril Loosen, RN, BSN on 11/18/2010   Method used:   Electronically to        [facility]* (retail)       [address]       [place], [place]  [address]       Ph: [phone]       Fax: [phone]   RxID:   [id number]   Handout requested.

## 2010-11-28 NOTE — Letter (Signed)
Summary: [facility] D/C   [facility] D/C   Imported By: Zachary George 11/18/2010 08:34:41  _____________________________________________________________________  External Attachment:    Type:   Image     Comment:   External Document

## 2010-12-02 ENCOUNTER — Other Ambulatory Visit: Payer: Self-pay | Admitting: *Deleted

## 2010-12-02 MED ORDER — ISOSORBIDE MONONITRATE ER 30 MG PO TB24: 30.0000 mg | ORAL_TABLET | Freq: Two times a day (BID) | ORAL | Status: DC

## 2010-12-19 ENCOUNTER — Other Ambulatory Visit: Payer: Self-pay | Admitting: Cardiology

## 2010-12-19 NOTE — Telephone Encounter (Signed)
[place] pt. 

## 2010-12-31 ENCOUNTER — Encounter: Payer: Self-pay | Admitting: *Deleted

## 2010-12-31 ENCOUNTER — Ambulatory Visit (INDEPENDENT_AMBULATORY_CARE_PROVIDER_SITE_OTHER): Payer: Medicaid Other | Admitting: Cardiology

## 2010-12-31 ENCOUNTER — Encounter: Payer: Self-pay | Admitting: Cardiology

## 2010-12-31 VITALS — BP 115/76 | HR 69 | Ht 63.0 in | Wt 156.0 lb

## 2010-12-31 DIAGNOSIS — E785 Hyperlipidemia, unspecified: Secondary | ICD-10-CM

## 2010-12-31 DIAGNOSIS — I2510 Atherosclerotic heart disease of native coronary artery without angina pectoris: Secondary | ICD-10-CM

## 2010-12-31 DIAGNOSIS — I10 Essential (primary) hypertension: Secondary | ICD-10-CM

## 2010-12-31 NOTE — Assessment & Plan Note (Signed)
Blood pressure well controlled. No medication changes made 

## 2010-12-31 NOTE — Assessment & Plan Note (Signed)
Symptomatically stable on medical therapy. Medications reviewed. Continue observation and followup at this point.

## 2010-12-31 NOTE — Assessment & Plan Note (Signed)
Continue present therapy and followup fasting lipid profile with liver function tests for her next visit.
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
## 2010-12-31 NOTE — Patient Instructions (Signed)
Your physician wants you to follow-up in: 4 months. You will receive a reminder letter in the mail one-two months in advance. If you don't receive a letter, please call our office to schedule the follow-up appointment. Your physician recommends that you go to the [facility] for a FASTING lipid profile and liver function labs. Do not eat or drink after midnight. Do in 4 months before your office visit. Your physician recommends that you continue on your current medications as directed. Please refer to the Current Medication list given to you today.

## 2011-01-06 ENCOUNTER — Encounter: Payer: Self-pay | Admitting: Physician Assistant

## 2011-01-14 NOTE — Assessment & Plan Note (Signed)
[facility]                          [facility] OFFICE NOTE   NAME:[name], Suzanne NAWABI                    MRN:          [id number]  DATE:08/02/2007                            DOB:          [birth year]    PRIMARY CARE:  Suzanne Picket Long, [name].   REASON FOR VISIT:  Routine followup.   HISTORY OF PRESENT ILLNESS:  [name] is doing well without any active  angina.  She states that she has been walking approximately a mile a day  with her dog.  I reviewed her medications, which are outlined below.  She does report some improvement in previous nausea on a proton pump  inhibitor.  Although she has lost some weight since her last visit.  She  does report eating, some days better than others.  No emesis or  abdominal pain noted.  She states that she is to undergo cataract  surgery and it was requested that she stop her Plavix.  We talked about  continuing this at least for 6 months given her drug-eluting stent  placement back in early August of this year.  Otherwise, no bleeding  problems.   ALLERGIES:  PENICILLIN, CODEINE.   PRESENT MEDICATIONS:  1. Aspirin 325 mg p.o. daily.  2. Lipitor 10 mg p.o. daily.  3. Metoprolol XL 25 mg p.o. daily.  4. Plavix 75 mg p.o. daily.  5. Lisinopril 10 mg p.o. daily.  6. Hydrochlorothiazide 25 mg p.o. daily.  7. Imdur 30 mg p.o. daily.  8. Protonix 40 mg p.o. daily.   REVIEW OF SYSTEMS:  As described in the history of present illness.   PHYSICAL EXAMINATION:  Blood pressure 121/74, heart rate 60, weight 159  pounds.  The patient is comfortable in no acute distress.  Examination of the neck reveals no elevated jugular venous pressure.  No  loud bruits.  LUNGS:  Clear without labored breathing.  CARDIAC:  Reveals a regular rate and rhythm.  No S3 gallop.  EXTREMITIES:  No pitting edema.   IMPRESSION/RECOMMENDATIONS:  1. Coronary artery disease status post interventions to the left      anterior descending  including most recently a drug-eluting stent in      August of 2006.  Otherwise, she has moderate residual disease in      the circumflex and right coronary arteries.  I have recommended      that she continue her present regimen and not to discontinue      Plavix, at least until following a total of 6 months from the time      that a drug-eluting stent was placed in August.  Otherwise, she is      symptomatically stable, and I would plan to see her back in 6      months.  2. Continue to follow up with [name].  The patient reports      she is due for blood work.  Would suggest a followup lipid profile      with liver function tests.  Her last LDL was 84 in August.  Goal      should be  around 70.     Suzanne Sidle, MD  Electronically Signed    SGM/MedQ  DD: 08/02/2007  DT: 08/02/2007  Job #: [id number]   cc:   Suzanne Pascal, [name]

## 2011-01-14 NOTE — Assessment & Plan Note (Signed)
[facility]                                 ON-CALL NOTE   NAME:BIBEEChareese, Sergent                    MRN:          [id number]  DATE:04/09/2007                            DOB:          [birth year]    PRIMARY CARDIOLOGIST:  Dr. Diona Browner in [place].   [name] is a 77 year old female that was recently hospitalized from  August 3 to April 06, 2007, at [facility] after she was transferred  down from [facility] with chest discomfort.  Catheterization was  performed and she underwent bare metal and drug eluting stents to the  LAD without difficulty.  She states that she felt fine yesterday but  this morning her legs feel cold.  She is not having any problems with  chest discomfort.  She checked her temperature and she is afebrile.  She  also checked her blood pressure and she states it was 100-something over  80s.  She cannot remember specifically what it was.  She is not sure  what to do.   On counseling her and reviewing E-chart and reviewing her symptoms, I  explained to her that I did not feel she was having a cardiac event.  I  suggested that she contact her other medical physician.  If she did not  want to do that she could present to [facility] where the  emergency room physician could check her over and call him if  appropriate to treat her chill.  She states her  catheterization site is  intact and her feet are warm.  She states that she will do this.      Joellyn Rued, PA-C  Electronically Signed      Madolyn Frieze. Jens Som, MD, [facility]  Electronically Signed   EW/MedQ  DD: 04/10/2007  DT: 04/10/2007  Job #: [id number]

## 2011-01-14 NOTE — Assessment & Plan Note (Signed)
[facility]                          [facility] NOTE   NAME:[name], [name]                MRN:          [id number]  DATE:01/01/2009                            DOB:          1931-05-15    PRIMARY CARE Iyari Hagner:  Lindaann Pascal, PAC at [facility].   REASON FOR VISIT:  Scheduled followup.   HISTORY OF PRESENT ILLNESS:  I saw Ms. Donovan back in November prior to  planned cataract surgery.  She states that she underwent bilateral  cataract surgery without any specific problems and overall has done well  without any angina or dyspnea.  I note that she was recently admitted to  [facility] with dizziness resulting in a fall.  She was  ultimately diagnosed with acute labyrinthitis and a urinary tract  infection and was found to have a fracture of her right 9th rib.  Cardiac monitoring did not reveal any specific dysrhythmias and her  electrocardiogram showed sinus bradycardia with no acute ST-T wave  changes.  Labs obtained during her hospital stay showed an LDL of 56,  HDL 50, total cholesterol 409, and triglycerides of 94.  We were not  asked to see her in consultation at that time.   ALLERGIES:  PENICILLIN and CODEINE.   PRESENT MEDICATIONS:  1. Aspirin 325 mg p.o. daily.  2. Metoprolol XL 25 mg p.o. daily.  3. Plavix 75 mg p.o. daily.  4. Lisinopril 10 mg p.o. daily.  5. Imdur 30 mg p.o. daily.  6. Simvastatin 40 mg p.o. nightly.  7. Sublingual nitroglycerin 0.4 mg p.r.n.  8. Xanax p.r.n.  9. Prilosec OTC p.r.n.   REVIEW OF SYSTEMS:  Outlined above.  She still has some residual right-  sided thoracic pain and is due to see Lorin Picket Long tomorrow in followup of  her rib fracture.  She states that Tylenol has been the most helpful for  this.  Otherwise reviewed negative.   PHYSICAL EXAMINATION:  VITAL SIGNS:  Blood pressure 150/76, heart rate  is 60, weight is 165 pounds, respirations 18  nonlabored.  GENERAL:  The patient is comfortable in no acute distress.  HEENT:  Conjunctiva is normal.  Oropharynx clear.  NECK:  Supple.  No elevated jugular venous pressure.  No loud bruits.  No thyromegaly is noted.  LUNGS:  Clear without labored breathing at rest.  CARDIAC:  Regular rate and rhythm.  No S3 gallop or pericardial rub.  ABDOMEN:  Soft, nontender.  Normoactive bowel sounds.  EXTREMITIES:  Exhibit no frank pitting edema.  Distal pulse are 2+.  SKIN:  Warm and dry.  MUSCULOSKELETAL:  No kyphosis noted.   IMPRESSION AND RECOMMENDATIONS:  1. Cardiovascular disease, status post stent placement of the proximal      and distal left anterior descending in August 2008 using a      combination of drug-eluting and bare metal stents.  These were      noted to be patent at angiography in April of last year and she has      been stable without any active angina symptoms.  Recent  electrocardiogram is stable.  We will plan to continue medical      therapy and I will see her back over the next 6 months.  2. Hyperlipidemia, well-controlled on simvastatin at present dose.     Jonelle Sidle, MD  Electronically Signed    SGM/MedQ  DD: 01/01/2009  DT: 01/02/2009  Job #: [id number]   cc:   Lindaann Pascal, PA

## 2011-01-14 NOTE — Assessment & Plan Note (Signed)
[facility]                          [facility]   NAME:[name], [name]                    MRN:          [id number]  DATE:04/12/2007                            DOB:          09/12/1930    PRIMARY CARE PHYSICIAN:  Lindaann Pascal, PA-C.[name].   REASON FOR VISIT:  Post-hospitalization followup.   HISTORY OF PRESENT ILLNESS:  [name] is a pleasant 77 year old woman  seen in consultation by the cardiology fellow here at [facility] on the third of August.  She presented at that time with chest  discomfort and was subsequently evaluated with a Myoview, showing an  ejection fraction of 59% with moderate anteroapical ischemia.  She was  subsequently transferred to [facility] and underwent a cardiac  catheterization by Dr. Riley Kill on August 4th.  The study revealed  significant left anterior descending disease, up to 70% to 80%  proximally, followed by 95% in the mid-portion.  She underwent placement  of a non-drug eluting and drug-eluting stent within the left anterior  descending and tolerated this well.   MEDICATIONS:  Are outlined below.   Symptomatically, [name] is not having any significant chest pain.  She has been trying to do some walking around her home and does plan [place]  [place].  She has complained of a  general feeling of occasional cold that she describes as a sensation  of cold fluid in her veins that occur sporadically and very briefly.  She has also noticed nausea and some change in her appetite, and I  wonder if she has had some gastritis on her recent aspirin and Plavix  regimen.  She is not reporting any changes in her stools.  Her  electrocardiogram today shows sinus bradycardia at 58 beats per minute.   ALLERGIES:  PENICILLIN AND CODEINE.   PRESENT MEDICATIONS:  1. Aspirin 325 mg p.o. q.d.  2. Lipitor 10 mg p.o. q.d.  3. Metoprolol ER 25 mg p.o.  q.d.  4. Plavix 75 mg p.o. q.d.  5. Lisinopril 10 mg p.o. q.d.  6. Hydrochlorothiazide 25 mg p.o. q.d.  7. Imdur 30 mg p.o. q.d.  8. Nitroglycerin p.r.n.  9. Sublingual nitroglycerin p.r.n.   REVIEW OF SYSTEMS:  As described in history of present illness.   EXAMINATION:  VITAL SIGNS:  Blood pressure is 113/71, heart rate is 71,  weight is 170.4 pounds.  GENERAL:  The patient is comfortable and in no acute distress.  HEENT:  Conjunctivae are normal.  Oropharynx clear.  NECK:  Supple.  No elevated jugular venous pressure.  No loud bruits.  LUNGS:  Clear, non-labored, beating at rest.  CARDIAC:  Reveals a regular rate and rhythm.  No loud murmur or S3  gallop.  ABDOMEN:  Soft, nontender.  Right groin site is stable.  There is a very  small area of resolving ecchymosis.  The patient had left her dressing  on from Angio-Seal placement, and I took this off today.  No loud bruit  or discomfort noted.  The site looks to be healing well.  EXTREMITIES:  Exhibit no pitting edema.  Distal pulses are 2+.  SKIN:  Warm and dry.   IMPRESSION:  1. Coronary artery disease, predominantly involving the left anterior      descending, with mild to moderate residual leads in the circumflex      and right coronary artery, based on recent cardiac catheterization.      Ejection fraction was 65%.  The patient is status post placement of      both non-drug eluting and drug-eluting stents in the left anterior      descending and has tolerated this well.  I plan to continue her      present medications, with the addition of a proton pump inhibitor,      in case she is experiencing some gastritis.  I have encouraged her      to proceed on to enroll in cardiac rehabilitation, and also we will      see her back in approximately 3 months.  2. Continue regular followup with Suzanne Long, PA-C.     Jonelle Sidle, MD  Electronically Signed    SGM/MedQ  DD: 04/12/2007  DT: 04/13/2007  Job #: [id number]   cc:    Lindaann Pascal, PA-C

## 2011-01-14 NOTE — Discharge Summary (Signed)
[name], [name]             ACCOUNT NO.:  [id number]   MEDICAL RECORD NO.:  [id number]          PATIENT TYPE:  INP   LOCATION:  6529                         FACILITY:  [facility]   PHYSICIAN:  Arturo Morton. Riley Kill, MD, FACCDATE OF BIRTH:  Jun 17, 1931   DATE OF ADMISSION:  04/04/2007  DATE OF DISCHARGE:                         DISCHARGE SUMMARY - REFERRING   DISCHARGE DIAGNOSES:  1. Unstable angina with transfer from [facility].  2. Coronary artery disease with bare metal and drug-eluting stents to      the left anterior descending.  3. Hyperlipidemia with continued elevated triglycerides.  4. Hyperglycemia without history of diabetes.  5. History as noted per past medical history.   PROCEDURE PERFORMED:  Cardiac catheterization with bare metal and drug-  eluting stents performed to the LAD by Dr. Bonnee Quin on April 05, 2007.  Noted transcriptions please.   SUMMARY OF HISTORY:  [name] is a 77 year old white female who was  admitted to [facility] on April 03, 2007, with a one week  history of exertional chest tightness that she described as a quivering  feeling.  This would particularly occur when she was walking to and from  the mailbox.  It is associated with CT and generalized weakness and  would resolve within less than 10 minutes of rest.  She denied any  associated symptoms or radiation.   PAST MEDICAL HISTORY:  1. Hypertension.  2. Hyperlipidemia.  3. GERD.  4. Cholecystectomy.  5. Appendectomy.  6. Bilateral vein stripping in the 60s.  7. Chronic back pain.   LABORATORY:  H&H on April 06, 2007, prior to discharge, was 12.3 and  35.8, normal indices, platelets 240, WBCs 8.7.  Prior to discharge,  sodium was 141, potassium 42, BUN 14, creatinine 1.02, glucose 107.  Fasting lipids showed a total cholesterol 162, triglycerides 159, HDL  46, LDL 84.  PTT on admission was 33, PT 13.3.   EKG showed baseline artifact, sinus bradycardia, normal sinus  rhythm,  normal axis, nonspecific ST-T wave changes.   HOSPITAL COURSE:  The patient was transferred from [facility] for  cardiac catheterization.  She did not have any further chest discomfort.  Dr. Diona Browner spoke with the patient's family as well as reviewing the  procedure risks and benefits of a cardiac catheterization.  Catheterization performed on April 05, 2007, by Dr. Riley Kill, showed  normal ejection fraction.  It showed 30-40% proximal/mid RCA, 40-50% mid  circumflex, showed a proximal LAD lesion of 60-70% and another LAD  lesion of 99%.  A drug-eluting stent was placed to the proximal LAD and  the bare metal stent was placed to the distal LAD by Dr. Riley Kill.  He  did note a residual 70-80% very small diagonal and recommended continued  medical treatment.  Post procedure, catheterization site was intact and  she was ambulating without difficulty.  By April 06, 2007, it was felt  that the patient could be discharged home.  Cardiac rehab assisted with  ambulation and education.   DISPOSITION:  The patient is discharged home, asked to maintain low-  sodium heart-healthy diet.  Wound care  and activities are per  supplemental sheet.   DISCHARGE MEDICATIONS:  She received new prescriptions for  1. Plavix 75 mg daily for at least one year.  2. Toprol XL 25 mg daily.  3. Nitroglycerin 0.4 as needed.  4. Imdur 30 mg nightly.  She is asked to continue  1. Aspirin 325 mg daily.  2. Lisinopril 10 mg daily.  3. Lipitor 10 mg nightly.  4. Omeprazole 20 mg daily.  5. Hydrochlorothiazide 25 mg daily.  She was asked to bring all medications to all appointments.   She will follow up with Dr. Diona Browner in the [place], [place], office  on April 12, 2007, at 12:30 and Dr. Loretha Stapler as needed.   DISCHARGE TIME:  35 minutes.      Joellyn Rued, PA-C      Arturo Morton. Riley Kill, MD, [facility]  Electronically Signed    EW/MEDQ  D:  04/06/2007  T:  04/06/2007  Job:  [id number]   cc:    Dr. Loretha Stapler

## 2011-01-14 NOTE — Assessment & Plan Note (Signed)
[facility]                          [facility]ICE NOTE   NAME:[name], [name]                MRN:          [id number]  DATE:07/25/2008                            DOB:          [birth year]    PRIMARY CARE PHYSICIAN:  Lindaann Pascal, PA-C with [facility].   REASON FOR VISIT:  Preoperative followup.   HISTORY OF PRESENT ILLNESS:  [name] was seen most recently in May of  2009.  She has known coronary artery disease status post previous stent  placement to the proximal and distal left anterior descending in August  2008 using both drug-eluting and bare-metal stents.  These areas were  noted to be widely patent at angiography in April of 2009, and  symptomatically [name] has been stable without progressive angina or  limiting breathlessness.  She states that she was walking up to 2 miles  a day before she had a fall at night while ambulating to the bathroom.  She has had pain in her right leg and foot since then.  She is pending  both cataract surgery and also what sounds like a epidural injection, in  the near future.  She continues on dual antiplatelet therapy at this  point.   ALLERGIES:  PENICILLIN and CODEINE.   MEDICATIONS:  1. Aspirin 325 mg p.o. daily.  2. Metoprolol 25 mg p.o. daily.  3. Plavix 75 mg p.o. daily.  4. Lisinopril 10 mg p.o. daily.  5. Hydrochlorothiazide 25 mg p.o. daily.  6. Imdur 30 mg p.o. daily.  7. Pantoprazole 40 mg p.o. daily.  8. Potassium 10 mEq p.o. daily.  9. Simvastatin 40 mg p.o. nightly.  10.Sublingual nitroglycerin 0.4 mg p.r.n.   REVIEW OF SYSTEMS:  As outlined above.  Otherwise negative.   PHYSICAL EXAMINATION:  VITAL SIGNS:  Blood pressure is 131/71, heart  rate is 59, and weight is 160 pounds.  GENERAL:  The patient is comfortable, and in no acute distress.  NECK:  Reveals no elevated jugular venous pressure, no loud bruits, no  thyromegaly is noted.  LUNGS:   Clear without labored breathing at rest.  CARDIAC:  Reveals a regular rate and rhythm.  No loud murmur or S3  gallop.  No pericardial rub.  EXTREMITIES:  Exhibit no significant pitting edema.   IMPRESSION AND RECOMMENDATIONS:  1. Known cardiovascular disease status post previous stent placement      to the proximal and distal left anterior descending in August 2008      using a combination of drug-eluting and bare-metal stents.  These      areas were patent at angiography in April 2009 and otherwise there      was nonobstructive atherosclerosis noted within the left circumflex      and right coronary arteries, with normal left ventricular systolic      function.  She is symptomatically stable and I think that she      should be able to proceed both with planned cataract surgery and      ultimately an epidural injection.  She should be able to      temporarily  discontinue aspirin and Plavix as needed for these      procedures.  We will plan to see her back in followup over the next      6 months.  2. Hyperlipidemia, on simvastatin 40 mg daily.  She is due for      followup lipid profile and liver function tests, which will be      arranged.     Jonelle Sidle, MD  Electronically Signed    SGM/MedQ  DD: 07/25/2008  DT: 07/25/2008  Job #: [id number]   cc:   Lindaann Pascal, PA-C  Richard D. Ethelene Hal, M.D.  Susa Simmonds, M.D.

## 2011-01-14 NOTE — Discharge Summary (Signed)
[name], [name]             ACCOUNT NO.:  [id number]   MEDICAL RECORD NO.:  [id number]          PATIENT TYPE:  INP   LOCATION:  3735                         FACILITY:  [facility]   PHYSICIAN:  Bettey Mare. [place], NPDATE OF BIRTH:  08-Mar-1931   DATE OF ADMISSION:  12/09/2007  DATE OF DISCHARGE:  12/10/2007                               DISCHARGE SUMMARY   PROCEDURE PERFORMED DURING HOSPITALIZATION:  Cardiac catheterization  performed by Dr. Arvilla Meres on December 10, 2007.   FINAL DISCHARGE DIAGNOSES:  1. Coronary artery disease with patent LAD stents and nonobstructive      disease with normal left ventricular function.  2. Hypertension.  3. Hypercholesterolemia.   HOSPITAL COURSE:  This is a 77 year old female patient of Dr. Simona Huh who resides in [place] who presented to [facility]  secondary to chest pain intermittently over the last 2 days prior to  admission.  She described this as tight hurting feeling.  Nitroglycerin did give relief.  The patient was seen and examined in  [place] by Dr. Peyton Bottoms and the patient was transferred to [facility] for cardiac catheterization with known history of coronary  artery disease.  She was diagnosed with unstable angina and had a  history of LAD disease with bare metal stent placed in 2008.  Her  troponins were found to be negative.  She was started on Lovenox and  heparin and the patient was transferred to [facility] on December 09, 2007.   The patient was seen and examined by Dr. Arvilla Meres and cardiac  catheterization was planned for definitive evaluation of coronary artery  disease and need for reintervention.  Catheterization was completed  revealing patent LAD stents, otherwise nonobstructive disease with a  normal LV function of 70%.  The patient tolerated the procedure well  without any further difficulties.  Right groin site was found to be  clean and dry without evidence of hematoma,  bleeding, or infection.  The  patient will be discharged back to home in [place] where she will  follow up with Dr. Simona Huh in the [place] office.  She will continue  current medication regimen she was on prior to admission, and a new  prescription for nitroglycerin has been provided.  The patient is to  follow up with her primary care physician for continued medical  management, and we will make further recommendations based upon the  patient's symptoms post recovery.   Vital signs on discharge, blood pressure 105/61, heart rate 68,  respirations 18, temperature 97.7.   Cholesterol 149, triglycerides 106, HDL 50, LDL 78.  Sodium 138,  potassium 3.5, chloride 102, CO2 27, glucose 100, BUN 13, and creatinine  1.12.   DISCHARGE MEDICATIONS:  1. Potassium 10 mEq daily.  2. Lipitor 10 mg daily.  3. Aspirin 325 mg daily.  4. Metoprolol 25 mg daily.  5. Plavix 75 mg daily.  6. Protonix 40 mg daily.  7. Isosorbide mono ER 30 mg daily.  8. Lisinopril 10 mg daily.  9. HCTZ 25 mg daily.  10.Xanax 0.5 mg 1/2 tablet p.o. at bedtime  p.r.n.   ALLERGIES:  PENICILLIN, CODEINE and CIPRO.   FOLLOWUP PLANS AND APPOINTMENT:  1. The patient will follow up with Dr. Simona Huh in the [place]      office.  Our office will call as this is a holiday weekend.  2. The patient is to follow up with her primary care physician for      continued medical management.  3. The patient has been given post cardiac catheterization      instructions with particular emphasis on the right groin site for      evidence of bleeding,  hematoma, or signs of infection.  No new      prescriptions were provided.   Time spent with the patient to include physician time 30 minutes.      Bettey Mare. Lyman Bishop, NP     KML/MEDQ  D:  12/10/2007  T:  12/11/2007  Job:  [id number]

## 2011-01-14 NOTE — Cardiovascular Report (Signed)
NAMEJENNAFER, [name]             ACCOUNT NO.:  [id number]   MEDICAL RECORD NO.:  [id number]          PATIENT TYPE:  INP   LOCATION:  6529                         FACILITY:  [facility]   PHYSICIAN:  Arturo Morton. Riley Kill, MD, FACCDATE OF BIRTH:  06-03-1931   DATE OF PROCEDURE:  04/05/2007  DATE OF DISCHARGE:  04/06/2007                            CARDIAC CATHETERIZATION   INDICATIONS:  [name] is a 77 year old who presents with Class II to  III angina pectoris.  The current study was done to assess coronary  anatomy.   PROCEDURES:  1. Left heart catheterization.  2. Selective coronary arteriography.  3. Selective left ventriculography.  4. Percutaneous transluminal cardiac angioplasty and stenting of the      left anterior descending artery with a 2 mm non-drug-eluting-stent      platform.  5. Stenting of the proximal left anterior descending artery with a      2.75 x 12 TAXUS drug-eluting platform.  6. Femoral closure with Angio-Seal.   DESCRIPTION OF PROCEDURE:  The patient was brought to the  catheterization laboratory, prepped and draped in usual fashion. Through  an anterior puncture, the femoral artery was entered.  A 6-French sheath  was placed.  Views of left and right coronary arteries obtained in  multiple angiographic projections.  Central aortic and left ventricular  pressures were measured with a pigtail. Ventriculography was performed  in the RAO projection.  Following this, we discussed the various items  given the patient's history.  We elected to recommend percutaneous  stenting of the left anterior descending artery.  The patient did have  diagonal disease, but the mid LAD appeared to be the culprit.  There was  also a proximal stenosis prior to the bifurcation of the diagonal.  We  discussed the options and elected to recommend proceeding on with  percutaneous intervention.   Following this, the patient was given bivalirudin, according to  protocol, and 600 mg of  oral clopidogrel.  A 6.6 French 3.5 runway guide  was then utilized.  Following this, the vessel was initially dilated  using a 1.5 and subsequently 2 mm Maverick balloon.  Following this, a  2.5 x 15 Multilink Vision stent was placed.  A 2 mm Quantum Maverick was  then placed in the lesion and deployed at 13 atmospheres throughout the  course of the stent for appropriate post dilatation.  We were concerned  about the lesion leading into the LAD-diagonal bifurcation, and is a  result, this was then dilated using a 2.5 x 9 mm monorail Maverick 2  balloon.  Following this, 2.75 x 12 TAXUS drug-eluting Express II stent  was deployed at 14 atmospheres.  Post dilatation was done with a 3.25 mm  Quantum Maverick balloon up to 14 atmospheres.  There was marked  improvement in the appearance of the artery.  We considered treating the  diagonal, but the diagonal is relatively small in size and would require  a small, non-drug-eluting platform and did not appear to be critical.   Following this, the patient was reprepped carefully and gloves  exchanged. An Angio-Seal closure device was  utilized to close the  femoral artery without complication.  She was then taken to the holding  area in satisfactory clinical condition.   HEMODYNAMIC DATA.:  1. Central aortic pressure was 127/53, mean 82.  2. Left ventricular pressure 131/6.  3. There is no gradient on pullback across aortic valve.   ANGIOGRAPHIC DATA.:  1. Ventriculography in the RAO projection revealed vigorous global      systolic function on a post PVC beat.  The EF appeared to be in      excess of 65%.  2. On plain fluoroscopy, there was pretty extensive calcification of      the left anterior descending artery particularly.  The LAD is a      diffusely diseased vessel.  3. The left main coronary artery is free of critical narrowing despite      some modest calcification.  4. The LAD courses to the apex.  It is a heavily calcified  artery.      The LAD has about a 70% area of hypodense narrowing just prior to      the bifurcation of the diagonal and the LAD proper.  The diagonal      itself has probably 70-80% disease; one view appears to be worse      than others, but the majority of views appear to be about 75-80%      with reasonable runoff into an otherwise 2 mm vessel.  The LAD      proper has 95% stenosis in the mid portion in what is otherwise a      heavily calcified vessel.  There is TIMI-3 flow, but the entire      lumen of the lesion is not completely seen.  Following stenting, a      heavily calcified distal lesion was reduced to 0% residual luminal      narrowing with an excellent angiographic response.  The vessel, as      mentioned, is diffusely diseased.  The proximal lesion was about      70% prior to the bifurcation of the diagonal and LAD and following      the procedure is 0% residual luminal narrowing with an excellent      angiographic appearance. The diagonal appears to be unchanged at      the completion of the procedure.  It potentially could be treatable      but is small in caliber, as noted, but not nearly as tight as the      LAD.  5. The circumflex has some mild irregularity proximally with perhaps      20-30% narrowing and then has a 40-50% area in the obtuse marginal      branch that is irregular.  It does not appear to be critical.  It      does provide a large marginal branch.  6. The right coronary artery has diffuse 30-40% segmental plaquing      throughout the proximal and distal portions of the mid vessel.  The      vessel then opens up into a fairly large-caliber distal right      coronary that provides a posterior descending and a somewhat      smaller posterolateral system.  Both of these are intact.   CONCLUSIONS:  1. Preserved left ventricular function.  2. Critical stenosis of the left anterior descending artery distally      with successful stenting using a small  non-drug-eluting stent  platform of 2 mm.  3. Moderately high-grade stenosis prior to the left anterior      descending-diagonal bifurcation with successful stenting using a      TAXUS drug-eluting platform.  4. Other residual disease as noted above.   DISPOSITION:  The patient will be treated medically.  She will need  aspirin and Plavix. Aspirin and Plavix should be given for minimum of 1  year, and continuation of Plavix will be at the discretion of the  primary cardiologist.      Arturo Morton. Riley Kill, MD, [facility]  Electronically Signed     TDS/MEDQ  D:  04/10/2007  T:  04/11/2007  Job:  [id number]   cc:   CV Laboratory  Jonelle Sidle, MD

## 2011-01-14 NOTE — Cardiovascular Report (Signed)
NAMEGWENEVERE, GOGA             ACCOUNT NO.:  [id number]   MEDICAL RECORD NO.:  [id number]          PATIENT TYPE:  INP   LOCATION:  3735                         FACILITY:  [facility]   PHYSICIAN:  Bevelyn Buckles. Bensimhon, MDDATE OF BIRTH:  08-25-1931   DATE OF PROCEDURE:  DATE OF DISCHARGE:  12/10/2007                            CARDIAC CATHETERIZATION   CARDIOLOGIST:  Dr. Jonelle Sidle.   INDICATIONS:  [name] is a very pleasant 77 year old woman who  underwent PTCA and stenting of the LAD in August of 2008 with a 2-mm non-  drug-eluting stent to the mid distal LAD and a 2.75 x 12 mm Taxus drug-  eluting stent to the proximal LAD.  She has been experiencing some  recurrent chest pain that is somewhat reminiscent of her previous angina  but not completely so.  She was admitted.  She ruled out from myocardial  infarction on serial cardiac markers.  She is transferred for  catheterization.   PROCEDURES PERFORMED:  1. Selective coronary angiography.  2. Left heart cath.  3. Left ventriculogram.   DESCRIPTION OF PROCEDURE:  The risks and indication of the  catheterization were explained.  Consent was signed and placed on the  chart.  A 5-French arterial sheath was placed in the right femoral  artery using a modified Seldinger technique.  Standard catheters  including JL-4, JR-4, and angled pigtail were used for the procedure.  During the procedure,  she did have some oozing around the sheath and  developed somewhat of a hematoma.  Pressure was held throughout the  case.  The groin was soft at the end of the case.  There is no evidence  of ongoing bleeding.  During the ventriculogram,  there was minimal  amount of staining of the myocardium.  With the pigtail catheter, this  resolved promptly.  On followup,  there is no evidence of complication.   Central aortic pressure was 119/50 with mean of 77.  LV pressure was  141/10 with no aortic stenosis.   Left main was normal.   LAD  was a long vessel coursing to the apex.  Gave off 2 diagonals.  There was a stent in the proximal portion of the LAD which was widely  patent.  At the mid section of the LAD, there was a 30% to 40% stenosis.  Then the stent in the mid section of the LAD was also widely patent.  There is some mild diffuse irregularities throughout the distal LAD.  In  the moderate-sized diagonal, there was 70% to 80% focal lesion.   Left circumflex made up mostly of a marginal branch.  There was a 30%  proximal lesion in the left circumflex and a 40% to 50% lesion in the  marginal branch.   Right coronary artery was a moderate-sized dominant vessel and gave off  PDA and posterolateral.  There is a 20% lesion in the proximal RCA and a  30% lesion in the mid section.   Left ventriculogram done in the RAO position showed an EF of 70%.  No  regional wall motion abnormalities.  No mitral regurgitation.  ASSESSMENT:  1. Coronary artery disease with patent left anterior descending      stents.  2. Nonobstructive disease in the left circumflex and right coronary      artery.  3. Normal left ventricular function.   PLAN/DISCUSSION:  Ms. Hitsman stents were widely patent.  She may have  some ischemia from the diagonal branch but this is a very narrow caliber  vessel and nonamenable to angioplasty.  She will need medical therapy.  She will be possibly discharged home later today if groin remains  stable.      Bevelyn Buckles. Bensimhon, MD  Electronically Signed     DRB/MEDQ  D:  12/10/2007  T:  12/11/2007  Job:  [id number]

## 2011-01-14 NOTE — Assessment & Plan Note (Signed)
[facility]                          [facility] OFFICE NOTE   NAME:BIBEEAllisha, Harter                MRN:          [id number]  DATE:12/31/2007                            DOB:          05-18-1931    PRIMARY CARDIOLOGIST:  Dr. Simona Huh   REASON FOR VISIT:  Post hospital follow-up.   HISTORY OF PRESENT ILLNESS:  [name] returns to our clinic today,  following recent transfer from [facility] to [facility] for further  evaluation of symptoms worrisome for unstable angina pectoris.  The  patient was referred to [place] in consultation, ruled out for myocardial  function with all serial cardiac markers within normal limits, but  presented with chest pain relieved by nitroglycerin.  She had known CAD  with a prior stenting of the LAD in August 2008, employing both drug and  non-drug-eluting stents.   Cardiac catheterization revealed widely patent stents of the LAD, with  nonobstructive CAD  of the CFX and RCA.  Left ventricular function was  normal.  Dr. Gala Romney did refer to the possibility of ischemia arising  from the diagonal branch.  However, he noted that this was a very narrow  vessel and, thus, not amenable to percutaneous intervention.  Continued  medical therapy was recommended.  The patient was discharged on same  home medication regimen.   The patient presents today reporting only one single episode of chest  pain since her recent catheterization.  Of note, she has resumed walking  a mile a day every morning, with no associated chest discomfort.   The patient reports no significant complications of the right groin  incision site.   Electrocardiogram today reveals sinus bradycardia 50 bpm with normal  axis and no ischemic changes.   RECENT LIPID PROFILE:  Total cholesterol 149, triglyceride 109, HDL 15,  LDL 78.   MEDICATIONS:  1. Plavix.  2. Full dose aspirin.  3. Lipitor 10 daily.  4. Lisinopril 10 daily.  5.  Hydrochlorothiazide 25 daily.  6. Imdur 30 daily.  7. KCl 10 daily.  8. Pantoprazole.   PHYSICAL EXAMINATION:  VITAL SIGNS:  Blood pressure 123/67, pulse 55,  regular, weight 158.  GENERAL:  A 77 year old female, sitting upright, no distress.  HEENT:  Normocephalic, atraumatic.  NECK:  Palpable carotid pulses without bruits; no JVD.  LUNGS:  Clear to auscultation in all fields.  HEART:  Regular rate and rhythm (S1, S2).  No significant murmurs.  No  rubs.  ABDOMEN:  Soft, nontender, intact bowel sounds.  EXTREMITIES:  Palpable right femoral pulse, no bruit auscultation, no  hematoma or ecchymosis.  Minimally palpable peripheral pulses.  No  significant edema.  NEUROLOGICAL:  No focal deficit.   IMPRESSION:  1. Noncardiac chest pain.      a.     Widely patent LAD stents by recent cardiac catheterization.      b.     Residual nonobstructive CAD.      c.     Hyperdynamic LV (EF 70%); no WMAs .      d.     Status post Taxus stenting proximal LAD/bare metal stenting  distal LAD, August 2008.  2. Dyslipidemia.  3. Gastroesophageal reflux disease.  4. Remote tobacco.  5. Hypertension, well-controlled.  6. Anxiety disorder.   PLAN:  1. Up titrate Lipitor to 20 mg daily for more aggressive lipid      management.  Most recent lipid profile reveals an LDL of 78.  We      will try to obtain a level of 70, or less, if at all possible.  2. Follow-up lipid profile in 12 weeks.  3. Return clinic follow-up with myself and Dr. Diona Browner in 6 months.      Rozell Searing, PA-C  Electronically Signed      Jonelle Sidle, MD  Electronically Signed   GS/MedQ  DD: 12/31/2007  DT: 12/31/2007  Job #: [id number]   cc:   [facility] [place], [place]

## 2011-01-17 NOTE — Op Note (Signed)
[name], [name]             ACCOUNT NO.:  [id number]   MEDICAL RECORD NO.:  [id number]          PATIENT TYPE:  AMB   LOCATION:  ENDO                         FACILITY:  [facility]   PHYSICIAN:  Malcolm T. Russella Dar, M.D. [facility] OF BIRTH:  Nov 06, 1930   DATE OF PROCEDURE:  12/22/2005  DATE OF DISCHARGE:  12/22/2005                                 OPERATIVE REPORT   INDICATIONS FOR PROCEDURE:  A 77 year old female with regurgitation,  dysphagia and coughing.  The patient was nervous and the examination was  difficult.   RESULTS:  Upper esophageal sphincter showed normal pressures and normal  relaxation.  There was a slightly elevated and nonspecific increase in the  duration of the relaxation.  Overall the upper esophageal sphincter study  appeared to be normal.   Esophageal body 73% peristaltic contractions with 9% nontransmitted and 18%  retrograde low peristaltic  wave amplitude was noted in the two proximal  channels, the two distal channels appeared to have normal amplitudes.   Lower esophageal sphincter study showed a low resting pressure at 8.5 mmHg  with normal relaxation to 1 mmHg measuring at 84% relaxation.   IMPRESSION:  Abnormal manometry with a hypotensive lower esophageal  sphincter and nonspecific low amplitude waves in the proximal portion of the  lower esophageal body.   PLAN:  Reviewed chart.  Maintain treatment for GERD and mildly abnormal and  nonspecific motility disturbance.      Venita Lick. Russella Dar, M.D. [facility]  Electronically Signed     MTS/MEDQ  D:  01/02/2006  T:  01/02/2006  Job:  [id number]

## 2011-01-20 ENCOUNTER — Other Ambulatory Visit: Payer: Self-pay | Admitting: *Deleted

## 2011-01-20 MED ORDER — METOPROLOL SUCCINATE ER 25 MG PO TB24: 25.0000 mg | ORAL_TABLET | Freq: Every day | ORAL | Status: DC

## 2011-02-17 ENCOUNTER — Emergency Department (HOSPITAL_COMMUNITY)
Admission: EM | Admit: 2011-02-17 | Discharge: 2011-02-18 | Disposition: A | Discharge status: Home / Self Care | Payer: Medicare Other | Attending: Emergency Medicine | Admitting: Emergency Medicine

## 2011-02-17 DIAGNOSIS — Z79899 Other long term (current) drug therapy: Secondary | ICD-10-CM | POA: Insufficient documentation

## 2011-02-17 DIAGNOSIS — M549 Dorsalgia, unspecified: Secondary | ICD-10-CM | POA: Insufficient documentation

## 2011-02-17 DIAGNOSIS — Z7902 Long term (current) use of antithrombotics/antiplatelets: Secondary | ICD-10-CM | POA: Insufficient documentation

## 2011-02-17 DIAGNOSIS — N390 Urinary tract infection, site not specified: Secondary | ICD-10-CM | POA: Insufficient documentation

## 2011-02-17 DIAGNOSIS — Z7982 Long term (current) use of aspirin: Secondary | ICD-10-CM | POA: Insufficient documentation

## 2011-02-17 DIAGNOSIS — I10 Essential (primary) hypertension: Secondary | ICD-10-CM | POA: Insufficient documentation

## 2011-02-18 ENCOUNTER — Emergency Department (HOSPITAL_COMMUNITY): Payer: Medicare Other

## 2011-02-18 LAB — BASIC METABOLIC PANEL
CO2: 27 mEq/L (ref 19–32)
Calcium: 10.6 mg/dL — ABNORMAL HIGH (ref 8.4–10.5)
Chloride: 101 mEq/L (ref 96–112)
Glucose, Bld: 126 mg/dL — ABNORMAL HIGH (ref 70–99)
Sodium: 137 mEq/L (ref 135–145)

## 2011-02-18 LAB — CBC
Hemoglobin: 12.6 g/dL (ref 12.0–15.0)
MCH: 31 pg (ref 26.0–34.0)
RBC: 4.07 MIL/uL (ref 3.87–5.11)
WBC: 8.6 10*3/uL (ref 4.0–10.5)

## 2011-02-18 LAB — DIFFERENTIAL
Basophils Relative: 0 % (ref 0–1)
Lymphs Abs: 0.4 10*3/uL — ABNORMAL LOW (ref 0.7–4.0)
Monocytes Relative: 5 % (ref 3–12)
Neutro Abs: 7.8 10*3/uL — ABNORMAL HIGH (ref 1.7–7.7)
Neutrophils Relative %: 90 % — ABNORMAL HIGH (ref 43–77)

## 2011-03-13 ENCOUNTER — Other Ambulatory Visit: Payer: Self-pay | Admitting: *Deleted

## 2011-03-13 DIAGNOSIS — E782 Mixed hyperlipidemia: Secondary | ICD-10-CM

## 2011-03-13 DIAGNOSIS — Z79899 Other long term (current) drug therapy: Secondary | ICD-10-CM

## 2011-04-23 ENCOUNTER — Telehealth: Payer: Self-pay | Admitting: *Deleted

## 2011-04-23 NOTE — Telephone Encounter (Signed)
Pt called regarding FLP/LFT that needs to be done before appt next week with Dr. Diona Browner. Pt would like to have labs done when she goes to [facility]. Pt notified she this is okay. She will have them send [place] a copy of results.

## 2011-05-01 ENCOUNTER — Encounter: Payer: Self-pay | Admitting: Cardiology

## 2011-05-02 ENCOUNTER — Encounter: Payer: Self-pay | Admitting: Cardiology

## 2011-05-02 ENCOUNTER — Ambulatory Visit (INDEPENDENT_AMBULATORY_CARE_PROVIDER_SITE_OTHER): Payer: Medicare Other | Admitting: Cardiology

## 2011-05-02 VITALS — BP 107/72 | HR 60 | Ht 63.0 in | Wt 145.0 lb

## 2011-05-02 DIAGNOSIS — E785 Hyperlipidemia, unspecified: Secondary | ICD-10-CM

## 2011-05-02 DIAGNOSIS — I2510 Atherosclerotic heart disease of native coronary artery without angina pectoris: Secondary | ICD-10-CM

## 2011-05-02 DIAGNOSIS — I10 Essential (primary) hypertension: Secondary | ICD-10-CM

## 2011-05-02 NOTE — Progress Notes (Signed)
Clinical Summary [name] is a 77 y.o.female presenting for followup. She was seen in May. Records indicate hospital admission in late July following a prolonged episode of chest pain. Cardiac markers were normal with nonspecific ECG, and she had no recurrent symptoms during observation. We were not asked to see the patient formally in consultation, however recommended office followup to Dr. Doyne Keel.  She reports no subsequent chest pain on present medications. ECG today is normal. She continues to be limited by back pain. Also has had trouble with recurrent urinary tract infections.  Recent lab work shows AST 15, ALT 7, cholesterol 174, triglycerides 159, HDL 58, LDL 74.  Allergies  Allergen Reactions  . Codeine     REACTION: shakiness  . Penicillins     REACTION: shakiness    Medication list reviewed.  Past Medical History  Diagnosis Date  . Coronary atherosclerosis of native coronary artery     DES LAD and BMS LAD 8/08  . Mixed hyperlipidemia   . GERD (gastroesophageal reflux disease)   . Essential hypertension, benign   . Anxiety   . Resting tremor   . COPD (chronic obstructive pulmonary disease)     Past Surgical History  Procedure Date  . Appendectomy   . Cholecystectomy   . Bilateral cataract surgery   . Vein ligation and stripping     Family History  Problem Relation Age of Onset  . Hypertension      Social History [name] reports that she quit smoking about 32 years ago. Her smoking use included Cigarettes. She has a 6.9 pack-year smoking history. She has never used smokeless tobacco. [name] reports that she does not drink alcohol.  Review of Systems As outlined above, otherwise negative.  Physical Examination Filed Vitals:   05/02/11 1538  BP: 107/72  Pulse: 60  Overweight woman in no acute distress.  HEENT: Conjunctiva and lids normal, oropharynx clear.  Neck: Supple, no elevated jugular venous pressure, no bruits.  Lungs: Clear to  auscultation, nonlabored.  Cardiac: Regular rate and rhythm, no S3 gallop.  Abdomen: Soft, nontender, no bruits.  Skin: Warm and dry.  Extremities: No pitting edema, distal pulses one plus.  Musculoskeletal: No kyphosis.  Neuropsychiatric: Alert and oriented x3, resting tremor, affect normal.    ECG Reviewed in EMR.   Problem List and Plan

## 2011-05-02 NOTE — Assessment & Plan Note (Signed)
LDL at goal 

## 2011-05-02 NOTE — Patient Instructions (Signed)
Follow up as scheduled. Your physician recommends that you continue on your current medications as directed. Please refer to the Current Medication list given to you today. 

## 2011-05-02 NOTE — Assessment & Plan Note (Signed)
Blood pressure is well-controlled today. 

## 2011-05-02 NOTE — Assessment & Plan Note (Signed)
No recurrent chest pain since hospital observation in July. For now continue observation on medical therapy. Followup ECG is normal.

## 2011-05-21 DIAGNOSIS — R0602 Shortness of breath: Secondary | ICD-10-CM

## 2011-05-21 DIAGNOSIS — R079 Chest pain, unspecified: Secondary | ICD-10-CM

## 2011-05-27 LAB — BASIC METABOLIC PANEL
CO2: 27
Calcium: 9.1
Chloride: 102
Glucose, Bld: 100 — ABNORMAL HIGH
Potassium: 3.5
Sodium: 138

## 2011-05-27 LAB — LIPID PANEL
HDL: 50
LDL Cholesterol: 78
Total CHOL/HDL Ratio: 3
Triglycerides: 106
VLDL: 21

## 2011-05-28 ENCOUNTER — Encounter (INDEPENDENT_AMBULATORY_CARE_PROVIDER_SITE_OTHER): Payer: Medicare Other | Admitting: *Deleted

## 2011-05-28 DIAGNOSIS — I4589 Other specified conduction disorders: Secondary | ICD-10-CM

## 2011-06-03 ENCOUNTER — Other Ambulatory Visit: Payer: Self-pay | Admitting: Cardiology

## 2011-06-12 ENCOUNTER — Encounter: Payer: Self-pay | Admitting: Cardiology

## 2011-06-13 ENCOUNTER — Encounter: Payer: Self-pay | Admitting: Cardiology

## 2011-06-13 ENCOUNTER — Ambulatory Visit (INDEPENDENT_AMBULATORY_CARE_PROVIDER_SITE_OTHER): Payer: Medicare Other | Admitting: Cardiology

## 2011-06-13 VITALS — BP 121/69 | HR 70 | Resp 16 | Ht 64.0 in | Wt 146.0 lb

## 2011-06-13 DIAGNOSIS — R5381 Other malaise: Secondary | ICD-10-CM

## 2011-06-13 DIAGNOSIS — E785 Hyperlipidemia, unspecified: Secondary | ICD-10-CM

## 2011-06-13 DIAGNOSIS — R5383 Other fatigue: Secondary | ICD-10-CM

## 2011-06-13 DIAGNOSIS — R531 Weakness: Secondary | ICD-10-CM | POA: Insufficient documentation

## 2011-06-13 DIAGNOSIS — I10 Essential (primary) hypertension: Secondary | ICD-10-CM

## 2011-06-13 DIAGNOSIS — I2510 Atherosclerotic heart disease of native coronary artery without angina pectoris: Secondary | ICD-10-CM

## 2011-06-13 NOTE — Assessment & Plan Note (Signed)
Blood pressure well-controlled today. 

## 2011-06-13 NOTE — Assessment & Plan Note (Signed)
Recent stress echocardiogram did not demonstrate any active ischemia. Continue medical therapy and observation for CAD. Patient is on dual antiplatelet therapy with previous history of stent placement in 2008.

## 2011-06-13 NOTE — Assessment & Plan Note (Signed)
Most likely multifactorial. She is now off beta blocker therapy, resting heart rate in the 70s. Her Holter monitor did not demonstrate any pauses, symptomatic bradycardia, or sustained arrhythmias. Recent ischemic testing was also reassuring. We will continue observation from a cardiac perspective.

## 2011-06-13 NOTE — Patient Instructions (Signed)
Your physician recommends that you continue on your current medications as directed. Please refer to the Current Medication list given to you today.  Your physician recommends that you schedule a follow-up appointment in: 3 months  

## 2011-06-13 NOTE — Assessment & Plan Note (Signed)
Continue on statin therapy. 

## 2011-06-13 NOTE — Progress Notes (Signed)
Clinical Summary [name] is a 77 y.o.female presenting for followup. She was seen in August. Interval history includes admission to [facility] in September with chest pain and weakness. She was seen in consultation by Dr. Andee [name], had an echocardiogram showing LVEF of 55-60%, and also underwent a stress echocardiogram that was overall negative for ischemia. No definite evidence of chronotropic incompetence off beta blocker, and she was scheduled also for a 48-hour Holter monitor with followup today in the office.  Holter monitor showed sinus rhythm with PVCs and PACs, no pauses, no prolonged episodes of bradycardia with a mean heart rate of 62 beats per minute. She reports no angina. She still complains of being fatigued, but mainly complains of lower back pain and leg pain. She has follow up with Dr. Channing [name], and also is apparently to see Dr. Teena [name] with history of GI bleeding, for a colonoscopy. It could be that anemia is also playing a role in her symptoms.   Allergies  Allergen Reactions  . Codeine     REACTION: shakiness  . Penicillins     REACTION: shakiness    Medication list reviewed.  Past Medical History  Diagnosis Date  . Coronary atherosclerosis of native coronary artery     DES LAD and BMS LAD 8/08  . Mixed hyperlipidemia   . GERD (gastroesophageal reflux disease)   . Essential hypertension, benign   . Anxiety   . Resting tremor   . COPD (chronic obstructive pulmonary disease)     Past Surgical History  Procedure Date  . Appendectomy   . Cholecystectomy   . Bilateral cataract surgery   . Vein ligation and stripping     Family History  Problem Relation Age of Onset  . Hypertension      Social History [name] reports that she quit smoking about 32 years ago. Her smoking use included Cigarettes. She has a 6.9 pack-year smoking history. She has never used smokeless tobacco. [name] reports that she does not drink alcohol.  Review of Systems As outlined above. No  palpitations or syncope. Otherwise negative.  Physical Examination Filed Vitals:   06/13/11 1515  BP: 121/69  Pulse: 70  Resp: 16    Overweight woman in no acute distress.  HEENT: Conjunctiva and lids normal, oropharynx clear.  Neck: Supple, no elevated jugular venous pressure, no bruits.  Lungs: Clear to auscultation, nonlabored.  Cardiac: Regular rate and rhythm, no S3 gallop.  Abdomen: Soft, nontender, no bruits.  Skin: Warm and dry.  Extremities: No pitting edema, distal pulses one plus.  Musculoskeletal: No kyphosis.  Neuropsychiatric: Alert and oriented x3, resting tremor, affect normal.    Problem List and Plan

## 2011-06-16 LAB — BASIC METABOLIC PANEL
BUN: 20
BUN: 22
CO2: 25
Calcium: 9
Chloride: 103
Chloride: 106
Creatinine, Ser: 0.8
Creatinine, Ser: 1.02
GFR calc Af Amer: >60
GFR calc Af Amer: >60
GFR calc Af Amer: >60
GFR calc non Af Amer: >60
GFR calc non Af Amer: >60
Potassium: 4
Potassium: 4.2

## 2011-06-16 LAB — CBC
HCT: 35.2 — ABNORMAL LOW
HCT: 35.8 — ABNORMAL LOW
MCHC: 34.4
MCV: 92
MCV: 93.2
Platelets: 219
RBC: 3.78 — ABNORMAL LOW
RBC: 3.89
WBC: 6.7
WBC: 8.7

## 2011-06-16 LAB — PROTIME-INR
INR: 1
Prothrombin Time: 13.3

## 2011-06-16 LAB — LIPID PANEL
Total CHOL/HDL Ratio: 3.5
VLDL: 32

## 2011-06-19 ENCOUNTER — Inpatient Hospital Stay (HOSPITAL_COMMUNITY)
Admission: RE | Admit: 2011-06-19 | Discharge: 2011-06-20 | DRG: 287 | Disposition: A | Discharge status: Home / Self Care | Payer: Medicare Other | Source: Other Acute Inpatient Hospital | Attending: Cardiovascular Disease | Admitting: Cardiovascular Disease

## 2011-06-19 DIAGNOSIS — R072 Precordial pain: Secondary | ICD-10-CM

## 2011-06-19 DIAGNOSIS — E785 Hyperlipidemia, unspecified: Secondary | ICD-10-CM | POA: Diagnosis present

## 2011-06-19 DIAGNOSIS — K219 Gastro-esophageal reflux disease without esophagitis: Secondary | ICD-10-CM | POA: Diagnosis present

## 2011-06-19 DIAGNOSIS — Z7982 Long term (current) use of aspirin: Secondary | ICD-10-CM

## 2011-06-19 DIAGNOSIS — Z79899 Other long term (current) drug therapy: Secondary | ICD-10-CM

## 2011-06-19 DIAGNOSIS — Z9861 Coronary angioplasty status: Secondary | ICD-10-CM

## 2011-06-19 DIAGNOSIS — I10 Essential (primary) hypertension: Secondary | ICD-10-CM | POA: Diagnosis present

## 2011-06-19 DIAGNOSIS — I252 Old myocardial infarction: Secondary | ICD-10-CM

## 2011-06-19 DIAGNOSIS — I2510 Atherosclerotic heart disease of native coronary artery without angina pectoris: Principal | ICD-10-CM | POA: Diagnosis present

## 2011-06-20 DIAGNOSIS — I2510 Atherosclerotic heart disease of native coronary artery without angina pectoris: Secondary | ICD-10-CM

## 2011-06-20 LAB — CBC
HCT: 35.3 % — ABNORMAL LOW (ref 36.0–46.0)
MCH: 31.2 pg (ref 26.0–34.0)
MCHC: 33.7 g/dL (ref 30.0–36.0)
MCV: 92.4 fL (ref 78.0–100.0)
Platelets: 213 10*3/uL (ref 150–400)
RDW: 13 % (ref 11.5–15.5)

## 2011-06-20 LAB — BASIC METABOLIC PANEL
CO2: 23 mEq/L (ref 19–32)
Calcium: 9.1 mg/dL (ref 8.4–10.5)
Creatinine, Ser: 0.76 mg/dL (ref 0.50–1.10)
GFR calc non Af Amer: 79 mL/min — ABNORMAL LOW (ref >90)

## 2011-06-20 LAB — LIPID PANEL
Cholesterol: 184 mg/dL (ref 0–200)
LDL Cholesterol: 101 mg/dL — ABNORMAL HIGH (ref 0–99)
VLDL: 17 mg/dL (ref 0–40)

## 2011-06-22 ENCOUNTER — Encounter (HOSPITAL_COMMUNITY): Payer: Self-pay | Admitting: Emergency Medicine

## 2011-06-22 ENCOUNTER — Other Ambulatory Visit: Payer: Self-pay

## 2011-06-22 ENCOUNTER — Emergency Department (HOSPITAL_COMMUNITY)
Admission: EM | Admit: 2011-06-22 | Discharge: 2011-06-22 | Disposition: A | Discharge status: Home / Self Care | Payer: Medicare Other | Attending: Emergency Medicine | Admitting: Emergency Medicine

## 2011-06-22 DIAGNOSIS — J449 Chronic obstructive pulmonary disease, unspecified: Secondary | ICD-10-CM | POA: Insufficient documentation

## 2011-06-22 DIAGNOSIS — F419 Anxiety disorder, unspecified: Secondary | ICD-10-CM

## 2011-06-22 DIAGNOSIS — X58XXXA Exposure to other specified factors, initial encounter: Secondary | ICD-10-CM | POA: Insufficient documentation

## 2011-06-22 DIAGNOSIS — S60219A Contusion of unspecified wrist, initial encounter: Secondary | ICD-10-CM | POA: Insufficient documentation

## 2011-06-22 DIAGNOSIS — J4489 Other specified chronic obstructive pulmonary disease: Secondary | ICD-10-CM | POA: Insufficient documentation

## 2011-06-22 DIAGNOSIS — Z7982 Long term (current) use of aspirin: Secondary | ICD-10-CM | POA: Insufficient documentation

## 2011-06-22 DIAGNOSIS — I2510 Atherosclerotic heart disease of native coronary artery without angina pectoris: Secondary | ICD-10-CM | POA: Insufficient documentation

## 2011-06-22 DIAGNOSIS — K219 Gastro-esophageal reflux disease without esophagitis: Secondary | ICD-10-CM | POA: Insufficient documentation

## 2011-06-22 DIAGNOSIS — R259 Unspecified abnormal involuntary movements: Secondary | ICD-10-CM | POA: Insufficient documentation

## 2011-06-22 DIAGNOSIS — Z87891 Personal history of nicotine dependence: Secondary | ICD-10-CM | POA: Insufficient documentation

## 2011-06-22 DIAGNOSIS — R251 Tremor, unspecified: Secondary | ICD-10-CM

## 2011-06-22 DIAGNOSIS — E782 Mixed hyperlipidemia: Secondary | ICD-10-CM | POA: Insufficient documentation

## 2011-06-22 DIAGNOSIS — I10 Essential (primary) hypertension: Secondary | ICD-10-CM | POA: Insufficient documentation

## 2011-06-22 DIAGNOSIS — F411 Generalized anxiety disorder: Secondary | ICD-10-CM | POA: Insufficient documentation

## 2011-06-22 HISTORY — DX: Rickets, active: E55.0

## 2011-06-22 LAB — URINE MICROSCOPIC-ADD ON

## 2011-06-22 LAB — URINALYSIS, ROUTINE W REFLEX MICROSCOPIC
Nitrite: NEGATIVE
Specific Gravity, Urine: 1.015 (ref 1.005–1.030)
Urobilinogen, UA: 0.2 mg/dL (ref 0.0–1.0)
pH: 5.5 (ref 5.0–8.0)

## 2011-06-22 MED ORDER — ALPRAZOLAM 0.25 MG PO TABS: 0.2500 mg | ORAL_TABLET | Freq: Three times a day (TID) | ORAL | Status: AC | PRN

## 2011-06-22 NOTE — ED Provider Notes (Addendum)
History     CSN: [id number] Arrival date & time: 06/22/2011  5:46 PM   First MD Initiated Contact with Patient 06/22/11 1806      Chief Complaint  Patient presents with  . Tremors  . Weakness    (Consider location/radiation/quality/duration/timing/severity/associated sxs/prior treatment) The history is provided by the patient.   patient states that she has had a tremor in both of her hands for a while now. She states it comes and goes. She states that she has had this previously she was on Xanax for. She states she is out of Xanax now. She was recently admitted to [facility] and then transferred to [facility] for heart catheterization. This showed no acute coronary disease. No fevers no localizing weakness no cough no dysuria. No pain in her cath site. Nothing worsens or tremors. Tremors are only in her hands.  Past Medical History  Diagnosis Date  . Coronary atherosclerosis of native coronary artery     DES LAD and BMS LAD 8/08  . Mixed hyperlipidemia   . GERD (gastroesophageal reflux disease)   . Essential hypertension, benign   . Anxiety   . Resting tremor   . COPD (chronic obstructive pulmonary disease)   . Rickets     Past Surgical History  Procedure Date  . Appendectomy   . Cholecystectomy   . Bilateral cataract surgery   . Vein ligation and stripping   . Cardiac catheterization   . Coronary angioplasty with stent placement     Family History  Problem Relation Age of Onset  . Hypertension      History  Substance Use Topics  . Smoking status: Former Smoker -- 0.3 packs/day for 23 years    Types: Cigarettes    Quit date: 09/01/1973  . Smokeless tobacco: Never Used  . Alcohol Use: No    OB History    Grav Para Term Preterm Abortions TAB SAB Ect Mult Living   4 4 4       4       Review of Systems  Constitutional: Negative for activity change and appetite change.  HENT: Negative for neck stiffness.   Eyes: Negative for pain.  Respiratory: Positive for  chest tightness. Negative for shortness of breath.   Cardiovascular: Negative for chest pain and leg swelling.  Gastrointestinal: Negative for nausea, vomiting, abdominal pain and diarrhea.  Genitourinary: Negative for flank pain.  Musculoskeletal: Negative for back pain.  Skin: Negative for rash.  Neurological: Positive for tremors. Negative for weakness, numbness and headaches.  Psychiatric/Behavioral: Negative for behavioral problems.       Anxiety    Allergies  Codeine and Penicillins  Home Medications   Current Outpatient Rx  Name Route Sig Dispense Refill  . ALPRAZOLAM 0.25 MG PO TABS Oral Take 0.25 mg by mouth 3 (three) times daily as needed.     . ASPIRIN 81 MG PO TABS Oral Take 81 mg by mouth daily.      Marland Kitchen CLOPIDOGREL BISULFATE 75 MG PO TABS  TAKE ONE TABLET BY MOUTH DAILY EMERGENCY REFILL FAXED DR 30 tablet 6    Generic NWG:NFAOZH    75MG  (TAB) patient needs ref ...  . ISOSORBIDE MONONITRATE ER 30 MG PO TB24 Oral Take 30 mg by mouth daily.      Marland Kitchen LISINOPRIL 10 MG PO TABS Oral Take 1 tablet (10 mg total) by mouth daily. 30 tablet 6    Generic YQM:VHQIONG   10MG  NEED REFILLS GAVE ERMER ...  . NITROGLYCERIN 0.4  MG SL SUBL Sublingual Place 0.4 mg under the tongue every 5 (five) minutes as needed.      Marland Kitchen SIMVASTATIN 40 MG PO TABS  TAKE 1 TABLET BY MOUTH AT BEDTIME EMERGENCY REFILL FAXED DR 30 tablet 6    Generic ZOX:WRUEA    40MG  patient needs refills, t ...    BP 150/42  Pulse 65  Temp(Src) 98.6 F (37 C) (Oral)  Resp 19  SpO2 99%  Physical Exam  Nursing note and vitals reviewed. Constitutional: She is oriented to person, place, and time. She appears well-developed and well-nourished.  HENT:  Head: Normocephalic and atraumatic.  Eyes: EOM are normal. Pupils are equal, round, and reactive to light.  Neck: Normal range of motion. Neck supple.  Cardiovascular: Normal rate, regular rhythm and normal heart sounds.   No murmur heard. Pulmonary/Chest: Effort normal and  breath sounds normal. No respiratory distress. She has no wheezes. She has no rales.  Abdominal: Soft. Bowel sounds are normal. She exhibits no distension. There is no tenderness. There is no rebound and no guarding.  Musculoskeletal: Normal range of motion.       Bruising at right radial artery at wrist  Neurological: She is alert and oriented to person, place, and time. No cranial nerve deficit.  Skin: Skin is warm and dry.  Psychiatric: She has a normal mood and affect. Her speech is normal.    ED Course  Procedures (including critical care time)   Labs Reviewed  URINALYSIS, ROUTINE W REFLEX MICROSCOPIC   No results found.   No diagnosis found.   Date: 06/22/2011  Rate: 64  Rhythm: normal sinus rhythm  QRS Axis: normal  Intervals: normal  ST/T Wave abnormalities: normal  Conduction Disutrbances:none  Narrative Interpretation:   Old EKG Reviewed: unchanged    MDM  Patient came in after tremors to her hands. States it's been coming on and off. She's had episodes of this before were treated with Xanax. She's recently admitted hospital and had heart catheterization done. 2 no new intervention. She's been attempting to get the Xanax refilled, but wouldn't be refilled without a visit to Dr. Her EKG is normal. Recent blood work yesterday. Urinalysis is pending at this point.   U/A showed WBC only. Culture sent. Will not treat.     Juliet Rude. Rubin Payor, MD 06/22/11 2128  Juliet Rude. Rubin Payor, MD 06/22/11 2133

## 2011-06-22 NOTE — ED Notes (Addendum)
Patient c/o chest pain and tremors since Wednesday. Patient seen at [facility] on Wednesday and admitted, sent to [facility] on Thursday by cardiologist, had Cath done on Friday. Nothing found with cardiac cath per son. Per son could be anxiety related. Patient denies any chest pain at this time but reports having weakness.

## 2011-06-22 NOTE — ED Notes (Signed)
Pt ambulatory with steady gait discharged with family. No complaints noted

## 2011-06-23 NOTE — Discharge Summary (Addendum)
[name], Suzanne NO.:  [id number]  MEDICAL RECORD NO.:  [id number]  LOCATION:  3741                         FACILITY:  [facility]  PHYSICIAN:  Peter M. Swaziland, M.D.  DATE OF BIRTH:  1931-04-29  DATE OF ADMISSION:  06/19/2011 DATE OF DISCHARGE:  06/20/2011                              DISCHARGE SUMMARY   DISCHARGE DIAGNOSES: 1. Coronary artery disease. 2. a.  Cardiac catheterization on June 20, 2011 showing single-     vessel obstructive coronary artery disease with moderate disease     and involving a small diagonal branch, with patent prior stent, no     significant change since prior cardiac catheterization and     continued medical therapy recommended. 3. b.  Prior stenting in the proximal and mid to distal LAD in 2008. 4. c.  Normal left ventricular function by catheterization June 20, 2011. 5. Gastroesophageal reflux disease. 6. Hypertension. 7. Hyperlipidemia. 8. Anxiety.  HOSPITAL COURSE:  [name] is a 77 year old lady with a history of prior stenting in 2008.  She presented with symptoms of acute chest pain considering for unstable angina initially to [facility].  She was seen in consultation by Cardiology and her symptoms felt to warrant cardiac catheterization, so she was sent over to [facility] for further evaluation.  Cardiac catheterization this morning demonstrated the above findings, stable with no significant change from prior catheterization.  Medical therapy was recommended.  She was initiated on statin.  She has baseline bradycardia with heart rates in the 60s at rest, so no beta-blocker was initiated this admission.  Dr. Swaziland has seen and examined her today and felt she is stable for discharge following an observation period from her catheterization.  LABORATORY DATA:  WBC 5.9, hemoglobin 11.9, hematocrit 35.3, platelet count 213.  INR 1.03.  Sodium 135, potassium 3.9, chloride 101 CO2 of 23, glucose  147, BUN 18, creatinine 0.76.  Total cholesterol 184, LDL 101, HDL 66, triglycerides 84.  STUDIES:  Cardiac catheterization on June 20, 2011, please see full report for details as well as HPI for summary.  DISCHARGE MEDICATIONS: 1. Nitroglycerin 0.4 mg sublingual every 5 minutes as needed up to 3     doses for chest pain. 2. Zocor 20 mg at bedtime. 3. Aspirin 81 mg daily. 4. Clopidogrel 75 mg daily. 5. Imdur 30 mg daily. 6. Lisinopril 10 mg daily.  DISPOSITION:  [name] will be discharged in stable condition to home. She is not to lift anything over 5 pounds for 1 week, drive for 2 days, or participate in sexual activity for 1 week.  She is to follow a low- sodium, heart- healthy diet and to call or return for pain, swelling, bleeding or pus at her cath site.  She will follow up with Dr. Diona Browner in our [place] office and our office will call her and schedule this appointment.  DURATION OF DISCHARGE ENCOUNTER:  Greater than 30 minutes including physician and PA time.     Ronie Spies, P.A.C.   ______________________________ Peter M. Swaziland, M.D.    DD/MEDQ  D:  06/20/2011  T:  06/21/2011  Job:  [id number]  cc:  Jonelle Sidle, MD  Electronically Signed by PETER Swaziland M.D. on 06/23/2011 02:57:08 PM Electronically Signed by Ronie Spies  on 07/01/2011 01:57:37 PM

## 2011-06-23 NOTE — Cardiovascular Report (Signed)
  [name], BRAZEL NO.:  [id number]  MEDICAL RECORD NO.:  [id number]  LOCATION:  3741                         FACILITY:  [facility]  PHYSICIAN:  Peter M. Swaziland, M.D.  DATE OF BIRTH:  03-Oct-1930  DATE OF PROCEDURE:  06/20/2011 DATE OF DISCHARGE:                           CARDIAC CATHETERIZATION   INDICATION FOR PROCEDURE:  A 77 year old white female with history of coronary artery disease with prior stenting of the proximal and mid to distal LAD in 2008.  She presents with symptoms of acute chest pain concerning for unstable angina.  She has ruled out for myocardial infarction.  She had a previous normal stress echo in August of this year.  Her last cardiac catheterization in 2011 showed stent patency.  PROCEDURE:  Left heart catheterization, coronary and left ventricular angiography.  ACCESS:  We initially attempted a right radial access.  We were able to access the artery with a needle but were unable to pass wire.  With removal of the needle, there was persistent pulsatile bleeding, so a TR band was applied.  We then accessed the right femoral artery using standard Seldinger technique without complications.  EQUIPMENT:  A 5-French 4 cm right and left Judkins catheter, 5-French pigtail catheter, 5-French arterial sheath.  MEDICATIONS:  Local anesthesia, 1% Xylocaine, Versed 1 mg IV, contrast 90 mL of Omnipaque.  HEMODYNAMIC DATA:  Aortic pressure is 139/60 with a mean of 91 mmHg. Left ventricular pressure is 137 with EDP of 10 mmHg.  ANGIOGRAPHIC DATA:  The left coronary artery arises and distributes normally.  The left main coronary artery is normal.  The left anterior descending artery demonstrates that the proximal and mid to distal vessel stents are widely patent.  Between the stents in the mid vessel, there is diffuse 30% narrowing.  There is a small first diagonal branch that has a focal 70% stenosis.  This is less than 1.5 mm in  diameter.  Left circumflex coronary artery distributes normally.  It gives rise to 3 marginal branches.  In the midvessel, there is diffuse 40% disease in a segmental fashion.  The right coronary arises and distributes normally.  There is segmental 30% disease in the midvessel.  The left ventricular angiography performed in the RAO view demonstrates normal left ventricular size and contractility with normal ejection fraction of 60%.  FINAL IMPRESSION: 1. Single-vessel obstructive coronary artery disease with a modest     lesion involving a small diagonal branch.  The stents in the     proximal and mid to distal left anterior descending are still     patent.  Compared to her prior cardiac     catheterization, there is no significant change 2. Normal left ventricular function.  PLAN:  We would recommend continued medical therapy.          ______________________________ Peter M. Swaziland, M.D.     PMJ/MEDQ  D:  06/20/2011  T:  06/20/2011  Job:  [id number]  cc:   Jonelle Sidle, MD Ernestina Penna, M.D.  Electronically Signed by PETER Swaziland M.D. on 06/23/2011 02:57:03 PM

## 2011-06-24 LAB — URINE CULTURE

## 2011-07-01 ENCOUNTER — Encounter: Payer: Self-pay | Admitting: *Deleted

## 2011-07-17 ENCOUNTER — Encounter: Payer: Self-pay | Admitting: Cardiology

## 2011-07-23 ENCOUNTER — Telehealth: Payer: Self-pay | Admitting: *Deleted

## 2011-07-23 ENCOUNTER — Ambulatory Visit (INDEPENDENT_AMBULATORY_CARE_PROVIDER_SITE_OTHER): Payer: Medicare Other | Admitting: Physician Assistant

## 2011-07-23 ENCOUNTER — Encounter: Payer: Self-pay | Admitting: Cardiology

## 2011-07-23 DIAGNOSIS — J984 Other disorders of lung: Secondary | ICD-10-CM

## 2011-07-23 DIAGNOSIS — E785 Hyperlipidemia, unspecified: Secondary | ICD-10-CM

## 2011-07-23 DIAGNOSIS — I2510 Atherosclerotic heart disease of native coronary artery without angina pectoris: Secondary | ICD-10-CM

## 2011-07-23 DIAGNOSIS — R918 Other nonspecific abnormal finding of lung field: Secondary | ICD-10-CM

## 2011-07-23 DIAGNOSIS — R911 Solitary pulmonary nodule: Secondary | ICD-10-CM

## 2011-07-23 DIAGNOSIS — I10 Essential (primary) hypertension: Secondary | ICD-10-CM

## 2011-07-23 NOTE — Assessment & Plan Note (Signed)
We'll order a followup chest CT scan, as recommended, in light of recent abnormal CXR suggestive of possible RUL nodule. She reports remote history of tobacco smoking.

## 2011-07-23 NOTE — Assessment & Plan Note (Signed)
No further workup currently indicated. Patient had recent diagnostic coronary angiogram revealing moderate, nonobstructive CAD with normal LVF. She has not had any recurrent CP. Therefore, continue current medication regimen, and schedule return followup with Dr. Diona Browner 6 months.

## 2011-07-23 NOTE — Assessment & Plan Note (Signed)
Followed by primary care provider, with recent down titration of Lipitor. Recommend LDL target 70 or less, if feasible.

## 2011-07-23 NOTE — Progress Notes (Signed)
Clinical Summary Ms. Hodgkin is a 77 y.o.female presenting for followup. I saw her in the office back in October. Following this she presented with acute chest pain requiring hospitalization at [facility]. She ruled out for myocardial infarction and underwent a cardiac catheterization demonstrating moderate disease within a small first diagonal branch, patent stents sites in the LAD, otherwise nonobstructive disease. Of note, she had had a negative stress echocardiogram in September at [place].  Clinically, patient reports no recurrent upper chest discomfort, which precipitated her most recent hospitalization. She does have some abdominal "tightness". She also had recent downtitration of her statin, by her primary care provider.  She denies any complications of the right groin incision site.   Allergies  Allergen Reactions  . Anti-Inflammatory Enzyme   . Ciprofloxacin   . Codeine     REACTION: shakiness  . Contrast Media (Iodinated Diagnostic Agents)   . Nsaids   . Penicillins     REACTION: shakiness    Medication list reviewed.  Past Medical History  Diagnosis Date  . Coronary atherosclerosis of native coronary artery     DES LAD and BMS LAD 8/08  . Mixed hyperlipidemia   . GERD (gastroesophageal reflux disease)   . Essential hypertension, benign   . Anxiety   . Resting tremor   . COPD (chronic obstructive pulmonary disease)   . Rickets     Past Surgical History  Procedure Date  . Appendectomy   . Cholecystectomy   . Bilateral cataract surgery   . Vein ligation and stripping   . Cardiac catheterization   . Coronary angioplasty with stent placement     Family History  Problem Relation Age of Onset  . Hypertension      Social History Ms. Gauthier reports that she quit smoking about 37 years ago. Her smoking use included Cigarettes. She has a 6.9 pack-year smoking history. She has never used smokeless tobacco. Ms. Reichow reports that she does not drink alcohol.  Review  of Systems Negative for exertional chest pain, DOE, orthopnea, PND, lower extremity edema, palpitations, presyncope/syncope, claudication, reflux, hematuria, hematochezia, or melena. Remaining systems reviewed, and are negative.  Physical Examination Filed Vitals:   07/23/11 1509  BP: 128/80  Pulse: 42    A 77 year old female, overweight, sitting upright, in no acute distress.  HEENT: Conjunctiva and lids normal, oropharynx clear.  Neck: Supple, no elevated jugular venous pressure, no bruits.  Lungs: Clear to auscultation, nonlabored.  Cardiac: Regular rate and rhythm, no S3 gallop.  Abdomen: Soft, nontender, no bruits.  Skin: Warm and dry.  Extremities: Right groin stable, palpable pulse with no hematoma or bruit; no significant peripheral edema Musculoskeletal: No kyphosis.  Neuropsychiatric: Alert and oriented x3, resting tremor, affect normal.   ECG   Studies   Problem List and Plan

## 2011-07-23 NOTE — Patient Instructions (Signed)
Your physician wants you to follow-up in: 6 months. You will receive a reminder letter in the mail one-two months in advance. If you don't receive a letter, please call our office to schedule the follow-up appointment. Your physician recommends that you continue on your current medications as directed. Please refer to the Current Medication list given to you today. CT of Chest. If the results of your test are normal or stable, you will receive a letter. If they are abnormal, the nurse will contact you by phone.

## 2011-07-23 NOTE — Telephone Encounter (Signed)
CT Chest Wo Contrast  Scheduled for 08-01-2011 @ [facility] Checking percert

## 2011-07-23 NOTE — Assessment & Plan Note (Signed)
Well-controlled on current medication regimen 

## 2011-07-25 NOTE — Telephone Encounter (Signed)
AUTH FOR ZO:XWRU MCR COMPLETE EAVW#[id number] EXP 09-08-11

## 2011-08-04 ENCOUNTER — Telehealth: Payer: Self-pay | Admitting: *Deleted

## 2011-08-04 DIAGNOSIS — R918 Other nonspecific abnormal finding of lung field: Secondary | ICD-10-CM

## 2011-08-04 NOTE — Telephone Encounter (Signed)
Called to Dr. Donnamarie Poag office to schedule appointment:Friday 08/29/11 @2 :15pm. Fax #[phone] ([address] They will mail you a packet to review and fill out before appointment. Patient informed. Office note, Ct scan faxed to their office.

## 2011-08-04 NOTE — Telephone Encounter (Signed)
Message copied by Eustace Moore on Mon Aug 04, 2011  1:44 PM ------      Message from: Eustace Moore      Created: Fri Aug 01, 2011  4:12 PM                   ----- Message -----         From: Rozell Searing, PA         Sent: 08/01/2011  11:30 AM           To: Hoover Brunette, LPN            CT scan results reviewed. Please refer pt to Dr Cherie Ouch for evaluation of pulmonary nodules.

## 2011-08-04 NOTE — Telephone Encounter (Signed)
Addended by: Eustace Moore on: 08/04/2011 02:08 PM   Modules accepted: Orders

## 2011-08-05 ENCOUNTER — Ambulatory Visit: Payer: Medicare Other | Admitting: Cardiology

## 2011-08-06 ENCOUNTER — Telehealth: Payer: Self-pay | Admitting: *Deleted

## 2011-08-06 NOTE — Telephone Encounter (Signed)
Left message for patient to call office.  

## 2011-08-06 NOTE — Telephone Encounter (Signed)
[name] called wanting to know why she is being referred to Dr. Orson Aloe. States that noone has called And gave her test results.

## 2011-08-06 NOTE — Telephone Encounter (Signed)
Patient informed again that she was seeing Pulmonologist for the pulmonary nodules found on her CT scan.

## 2011-08-07 ENCOUNTER — Encounter: Payer: Self-pay | Admitting: Cardiology

## 2011-08-11 ENCOUNTER — Telehealth: Payer: Self-pay | Admitting: *Deleted

## 2011-08-11 NOTE — Telephone Encounter (Signed)
Son informed again that the reason patient is going to Dr. Orson Aloe is to have pulmonary nodules evaluated that were found on her CT scan.

## 2011-08-13 ENCOUNTER — Other Ambulatory Visit: Payer: Self-pay | Admitting: Cardiology

## 2011-08-19 ENCOUNTER — Encounter: Payer: Self-pay | Admitting: Cardiology

## 2011-08-19 ENCOUNTER — Ambulatory Visit (INDEPENDENT_AMBULATORY_CARE_PROVIDER_SITE_OTHER): Payer: Medicare Other | Admitting: Cardiology

## 2011-08-19 DIAGNOSIS — I10 Essential (primary) hypertension: Secondary | ICD-10-CM

## 2011-08-19 DIAGNOSIS — R918 Other nonspecific abnormal finding of lung field: Secondary | ICD-10-CM

## 2011-08-19 DIAGNOSIS — I2510 Atherosclerotic heart disease of native coronary artery without angina pectoris: Secondary | ICD-10-CM

## 2011-08-19 NOTE — Assessment & Plan Note (Signed)
Noted by CT scan of the chest in followup of an abnormal chest x-ray. May simply reflect evidence of granulomatous disease, however cannot exclude lymphadenopathy that might require further evaluation. She is already scheduled to see Dr. Orson Aloe next week. I reviewed this with her.

## 2011-08-19 NOTE — Progress Notes (Signed)
Clinical Summary [name] is a 77 y.o.female presenting for followup. She was seen in November Following recent cardiac catheterization that demonstrated patent stent sites, and plan for medical therapy. At that time she was scheduled to have a followup CT scan of the chest to investigate possibility of pulmonary nodules in light of abnormal chest x-ray.   Chest CT from 11/29 reported small bilateral pulmonary nodules, some consistent with calcified granulomas, some possibly noncalcified granulomas or small intrapulmonary lymph nodes. This was reviewed by Mr. Serpe, and the patient is scheduled to see Dr. Orson Aloe for further evaluation.  She presents to the office today, presumably to discuss the chest CT results, as she was to see me in 6 months. She denies any angina. Does have intermittent cough, no fevers or chills. I reviewed the results of the chest CT scan with her in detail. Findings may simply reflect evidence of prior granulomatous disease, however formal evaluation by Pulmonary Medicine is indicated. She states that she will keep her visit with Dr. Orson Aloe.   Allergies  Allergen Reactions  . Anti-Inflammatory Enzyme   . Ciprofloxacin   . Codeine     REACTION: shakiness  . Contrast Media (Iodinated Diagnostic Agents)   . Nsaids   . Penicillins     REACTION: shakiness    Medication list reviewed.  Past Medical History  Diagnosis Date  . Coronary atherosclerosis of native coronary artery     DES LAD and BMS LAD 8/08  . Mixed hyperlipidemia   . GERD (gastroesophageal reflux disease)   . Essential hypertension, benign   . Anxiety   . Resting tremor   . COPD (chronic obstructive pulmonary disease)   . Rickets     Social History [name] reports that she quit smoking about 37 years ago. Her smoking use included Cigarettes. She has a 6.9 pack-year smoking history. She has never used smokeless tobacco. [name] reports that she does not drink alcohol.  Review of  Systems Chronic problems with back pain. No palpitations. No syncope. Otherwise as outlined above.  Physical Examination Filed Vitals:   08/19/11 1312  BP: 106/69  Pulse: 64    HEENT: Conjunctiva and lids normal, oropharynx clear.  Neck: Supple, no elevated jugular venous pressure, no bruits.  Lungs: Clear to auscultation, nonlabored.  Cardiac: Regular rate and rhythm, no S3 gallop.  Abdomen: Soft, nontender, no bruits.  Skin: Warm and dry.  Extremities: No significant peripheral edema     Problem List and Plan

## 2011-08-19 NOTE — Assessment & Plan Note (Signed)
Blood pressure well-controlled today. 

## 2011-08-19 NOTE — Patient Instructions (Signed)
Your physician wants you to follow-up in: 6 months. You will receive a reminder letter in the mail one-two months in advance. If you don't receive a letter, please call our office to schedule the follow-up appointment. Your physician recommends that you continue on your current medications as directed. Please refer to the Current Medication list given to you today. Keep appointment with Dr. Orson Aloe.

## 2011-08-19 NOTE — Assessment & Plan Note (Signed)
Symptomatically stable on medical therapy. Recent cardiac catheterization demonstrated patent stent sites. Continue observation.

## 2011-10-15 ENCOUNTER — Other Ambulatory Visit: Payer: Self-pay | Admitting: *Deleted

## 2011-10-15 MED ORDER — ISOSORBIDE MONONITRATE ER 30 MG PO TB24: 30.0000 mg | ORAL_TABLET | Freq: Every day | ORAL | Status: DC

## 2011-11-27 HISTORY — PX: OTHER SURGICAL HISTORY: SHX169

## 2011-12-17 ENCOUNTER — Encounter: Payer: Self-pay | Admitting: Cardiology

## 2012-01-02 ENCOUNTER — Other Ambulatory Visit: Payer: Self-pay | Admitting: *Deleted

## 2012-01-02 MED ORDER — CLOPIDOGREL BISULFATE 75 MG PO TABS: 75.0000 mg | ORAL_TABLET | Freq: Every day | ORAL | Status: DC

## 2012-01-07 ENCOUNTER — Encounter: Payer: Self-pay | Admitting: Emergency Medicine

## 2012-01-07 ENCOUNTER — Other Ambulatory Visit: Payer: Self-pay | Admitting: Emergency Medicine

## 2012-01-27 ENCOUNTER — Encounter: Payer: Self-pay | Admitting: Emergency Medicine

## 2012-01-28 ENCOUNTER — Other Ambulatory Visit: Payer: Self-pay | Admitting: *Deleted

## 2012-01-28 ENCOUNTER — Encounter: Payer: Self-pay | Admitting: Cardiology

## 2012-01-28 ENCOUNTER — Telehealth: Payer: Self-pay | Admitting: *Deleted

## 2012-01-28 ENCOUNTER — Other Ambulatory Visit: Payer: Self-pay | Admitting: Cardiology

## 2012-01-28 ENCOUNTER — Ambulatory Visit (INDEPENDENT_AMBULATORY_CARE_PROVIDER_SITE_OTHER): Payer: Medicare Other | Admitting: Cardiology

## 2012-01-28 VITALS — BP 161/66 | HR 66 | Ht 64.0 in | Wt 144.0 lb

## 2012-01-28 DIAGNOSIS — E785 Hyperlipidemia, unspecified: Secondary | ICD-10-CM

## 2012-01-28 DIAGNOSIS — I10 Essential (primary) hypertension: Secondary | ICD-10-CM

## 2012-01-28 DIAGNOSIS — I2510 Atherosclerotic heart disease of native coronary artery without angina pectoris: Secondary | ICD-10-CM

## 2012-01-28 DIAGNOSIS — R918 Other nonspecific abnormal finding of lung field: Secondary | ICD-10-CM

## 2012-01-28 DIAGNOSIS — Z79899 Other long term (current) drug therapy: Secondary | ICD-10-CM

## 2012-01-28 MED ORDER — CLOPIDOGREL BISULFATE 75 MG PO TABS: 75.0000 mg | ORAL_TABLET | Freq: Every day | ORAL | Status: DC

## 2012-01-28 NOTE — Assessment & Plan Note (Signed)
Symptomatically stable on medical therapy. No changes made today. Continue observation. 

## 2012-01-28 NOTE — Assessment & Plan Note (Signed)
Likely granulomatous disease. This is being followed by Dr. Orson Aloe.

## 2012-01-28 NOTE — Progress Notes (Signed)
Clinical Summary Ms. Jolley is a 78 y.o.female presenting for followup. She was seen in December 2012. She reports no progressive angina symptoms, has had intermittent chest pain that is atypical in description. She has had no progressive shortness of breath.  She reports compliance with her medications, has assistance from her son. She indicates interval problems with an ear infection, also continued anxiety. She states that she is on an anxiolytic. She has also had followup with Dr. Orson Aloe for pulmonary nodules, reports that she has been cut back to a one-year followup with stable findings.  She has not had recent lipid profile.   Allergies  Allergen Reactions  . Ciprofloxacin   . Codeine     REACTION: shakiness  . Contrast Media (Iodinated Diagnostic Agents)   . Nsaids   . Nutritional Supplements   . Penicillins     REACTION: shakiness    Current Outpatient Prescriptions  Medication Sig Dispense Refill  . acetaminophen (TYLENOL) 500 MG tablet Take 500 mg by mouth as needed.        Marland Kitchen aspirin 81 MG tablet Take 81 mg by mouth daily.        Marland Kitchen atorvastatin (LIPITOR) 20 MG tablet Take 20 mg by mouth at bedtime.        . isosorbide mononitrate (IMDUR) 30 MG 24 hr tablet Take 1 tablet (30 mg total) by mouth daily.  30 tablet  6  . lisinopril (PRINIVIL,ZESTRIL) 10 MG tablet Take 1 tablet (10 mg total) by mouth daily.  30 tablet  6  . nitroGLYCERIN (NITROSTAT) 0.4 MG SL tablet Place 0.4 mg under the tongue every 5 (five) minutes as needed.        . clopidogrel (PLAVIX) 75 MG tablet Take 1 tablet (75 mg total) by mouth daily.  30 tablet  6    Past Medical History  Diagnosis Date  . Coronary atherosclerosis of native coronary artery     DES LAD and BMS LAD 8/08  . Mixed hyperlipidemia   . GERD (gastroesophageal reflux disease)   . Essential hypertension, benign   . Anxiety   . Resting tremor   . COPD (chronic obstructive pulmonary disease)   . Rickets     Social History Ms.  Toda reports that she quit smoking about 38 years ago. Her smoking use included Cigarettes. She has a 6.9 pack-year smoking history. She has never used smokeless tobacco. Ms. Latif reports that she does not drink alcohol.  Review of Systems No palpitations. No falls. Reports reasonable sleep, no change in appetite. No reported bleeding problems. Otherwise negative.  Physical Examination Filed Vitals:   01/28/12 1253  BP: 161/66  Pulse: 66    HEENT: Conjunctiva and lids normal, oropharynx clear.  Neck: Supple, no elevated jugular venous pressure, no bruits.  Lungs: Clear to auscultation, nonlabored.  Cardiac: Regular rate and rhythm, no S3 gallop.  Abdomen: Soft, nontender, no bruits.  Skin: Warm and dry.  Extremities: No significant peripheral edema  Musculoskeletal: Mild kyphosis. Neuropsychiatric: Alert and oriented x3, resting tremor.   Problem List and Plan   CORONARY ATHEROSCLEROSIS NATIVE CORONARY ARTERY Symptomatically stable on medical therapy. No changes made today. Continue observation.  ESSENTIAL HYPERTENSION, BENIGN Blood pressure elevated today. Reinforced compliance with sodium restriction and diet. She states that she is taking her medications as directed.  HYPERLIPIDEMIA Due for followup FLP and LFT on statin therapy. This will be arranged.  Pulmonary nodules Likely granulomatous disease. This is being followed by Dr. Orson Aloe.  Satira Sark, M.D., F.A.C.C.

## 2012-01-28 NOTE — Assessment & Plan Note (Signed)
Due for followup FLP and LFT on statin therapy. This will be arranged.

## 2012-01-28 NOTE — Telephone Encounter (Signed)
Medication list updated per patient request.

## 2012-01-28 NOTE — Assessment & Plan Note (Signed)
Blood pressure elevated today. Reinforced compliance with sodium restriction and diet. She states that she is taking her medications as directed.

## 2012-01-28 NOTE — Patient Instructions (Signed)
Your physician recommends that you schedule a follow-up appointment in: 6 months. You will receive a reminder letter in the mail in about 4 months reminding you to call and schedule your appointment. If you don't receive this letter, please contact our office.   Your physician recommends that you continue on your current medications as directed. Please refer to the Current Medication list given to you today.   Your physician recommends that you return for a FASTING lipid/liver profile: on 01/29/12 at [facility].

## 2012-01-29 ENCOUNTER — Telehealth: Payer: Self-pay | Admitting: *Deleted

## 2012-01-29 NOTE — Telephone Encounter (Signed)
Message copied by Eustace Moore on Thu Jan 29, 2012  4:45 PM ------      Message from: MCDOWELL, Illene Bolus      Created: Thu Jan 29, 2012 11:23 AM       Reviewed report. LFTs are normal. LDL looks quite good at 61.

## 2012-01-29 NOTE — Telephone Encounter (Signed)
Patient informed. 

## 2012-03-15 ENCOUNTER — Other Ambulatory Visit: Payer: Self-pay | Admitting: Cardiology

## 2012-03-15 MED ORDER — LISINOPRIL 10 MG PO TABS: 10.0000 mg | ORAL_TABLET | Freq: Every day | ORAL | Status: DC

## 2012-05-19 ENCOUNTER — Telehealth: Payer: Self-pay | Admitting: Cardiology

## 2012-05-19 NOTE — Telephone Encounter (Signed)
Would like refills called into [facility] for [facility] 5mg 

## 2012-05-20 NOTE — Telephone Encounter (Signed)
Patient informed that she would need to get this prescription from her PCP.

## 2012-05-30 DIAGNOSIS — M6281 Muscle weakness (generalized): Secondary | ICD-10-CM

## 2012-06-05 ENCOUNTER — Observation Stay (HOSPITAL_COMMUNITY)
Admission: EM | Admit: 2012-06-05 | Discharge: 2012-06-07 | Disposition: A | Discharge status: Home / Self Care | Payer: Medicare Other | Attending: Internal Medicine | Admitting: Internal Medicine

## 2012-06-05 ENCOUNTER — Emergency Department (HOSPITAL_COMMUNITY): Payer: Medicare Other

## 2012-06-05 ENCOUNTER — Encounter (HOSPITAL_COMMUNITY): Payer: Self-pay | Admitting: *Deleted

## 2012-06-05 DIAGNOSIS — J449 Chronic obstructive pulmonary disease, unspecified: Secondary | ICD-10-CM | POA: Insufficient documentation

## 2012-06-05 DIAGNOSIS — R001 Bradycardia, unspecified: Secondary | ICD-10-CM | POA: Diagnosis present

## 2012-06-05 DIAGNOSIS — E785 Hyperlipidemia, unspecified: Secondary | ICD-10-CM | POA: Diagnosis present

## 2012-06-05 DIAGNOSIS — F419 Anxiety disorder, unspecified: Secondary | ICD-10-CM

## 2012-06-05 DIAGNOSIS — J4489 Other specified chronic obstructive pulmonary disease: Secondary | ICD-10-CM | POA: Insufficient documentation

## 2012-06-05 DIAGNOSIS — R5383 Other fatigue: Secondary | ICD-10-CM

## 2012-06-05 DIAGNOSIS — I498 Other specified cardiac arrhythmias: Secondary | ICD-10-CM | POA: Insufficient documentation

## 2012-06-05 DIAGNOSIS — I2510 Atherosclerotic heart disease of native coronary artery without angina pectoris: Secondary | ICD-10-CM | POA: Diagnosis present

## 2012-06-05 DIAGNOSIS — R079 Chest pain, unspecified: Principal | ICD-10-CM | POA: Insufficient documentation

## 2012-06-05 DIAGNOSIS — R918 Other nonspecific abnormal finding of lung field: Secondary | ICD-10-CM

## 2012-06-05 DIAGNOSIS — I200 Unstable angina: Secondary | ICD-10-CM | POA: Diagnosis present

## 2012-06-05 DIAGNOSIS — D649 Anemia, unspecified: Secondary | ICD-10-CM | POA: Diagnosis not present

## 2012-06-05 DIAGNOSIS — K219 Gastro-esophageal reflux disease without esophagitis: Secondary | ICD-10-CM | POA: Diagnosis present

## 2012-06-05 DIAGNOSIS — I10 Essential (primary) hypertension: Secondary | ICD-10-CM | POA: Diagnosis present

## 2012-06-05 HISTORY — DX: Acute myocardial infarction, unspecified: I21.9

## 2012-06-05 LAB — COMPREHENSIVE METABOLIC PANEL
Albumin: 3.7 g/dL (ref 3.5–5.2)
Alkaline Phosphatase: 66 U/L (ref 39–117)
BUN: 14 mg/dL (ref 6–23)
Chloride: 101 mEq/L (ref 96–112)
GFR calc Af Amer: >90 mL/min (ref >90)
Glucose, Bld: 115 mg/dL — ABNORMAL HIGH (ref 70–99)
Potassium: 3.8 mEq/L (ref 3.5–5.1)
Total Bilirubin: 0.4 mg/dL (ref 0.3–1.2)

## 2012-06-05 LAB — CBC WITH DIFFERENTIAL/PLATELET
Hemoglobin: 12.4 g/dL (ref 12.0–15.0)
Lymphocytes Relative: 29 % (ref 12–46)
Lymphs Abs: 2.1 10*3/uL (ref 0.7–4.0)
Monocytes Relative: 6 % (ref 3–12)
Neutro Abs: 4.4 10*3/uL (ref 1.7–7.7)
Neutrophils Relative %: 62 % (ref 43–77)
RBC: 3.93 MIL/uL (ref 3.87–5.11)

## 2012-06-05 LAB — TROPONIN I: Troponin I: <0.3 ng/mL (ref <0.30)

## 2012-06-05 NOTE — ED Provider Notes (Signed)
History     CSN: [id number]  Arrival date & time 06/05/12  2203   First MD Initiated Contact with Patient 06/05/12 2259      Chief Complaint  Patient presents with  . Chest Pain    (Consider location/radiation/quality/duration/timing/severity/associated sxs/prior treatment) HPI Comments: 78 year old female with a history of coronary artery disease status post stenting in the past who presents with a complaint of recurrent chest pain. The patient states that she had 2 discrete episodes of a heaviness and pressure in her chest this evening which was just left of the sternum, lasted 10 minutes for the first episode, 20 minutes the second episode and resolved after taking nitroglycerin. She does get short of breath with these painful episodes but denies nausea vomiting diaphoresis. There is no radiation of the pain. She has been seen by cardiology in the past, had a heart catheterization approximately 1 year ago showing patent stents and no other significant obstructive coronary disease. The patient admits that she has been compliant with her medications. She is chest pain-free at this time  Patient is a 78 y.o. female presenting with chest pain. The history is provided by the patient, a relative and medical records.  Chest Pain     Past Medical History  Diagnosis Date  . Coronary atherosclerosis of native coronary artery     DES LAD and BMS LAD 8/08  . Mixed hyperlipidemia   . GERD (gastroesophageal reflux disease)   . Essential hypertension, benign   . Anxiety   . Resting tremor   . COPD (chronic obstructive pulmonary disease)   . Rickets   . MI (myocardial infarction) 2008    Past Surgical History  Procedure Date  . Appendectomy   . Cholecystectomy   . Bilateral cataract surgery   . Vein ligation and stripping   . Cardiac catheterization   . Coronary angioplasty with stent placement   . Left trochanteric bursa injection 11/27/11    DR. O'TOOLE  . Left sacroiliac joint  injection 11/27/11    DR. O'TOOLE    Family History  Problem Relation Age of Onset  . Hypertension      History  Substance Use Topics  . Smoking status: Former Smoker -- 0.3 packs/day for 23 years    Types: Cigarettes    Quit date: 09/01/1973  . Smokeless tobacco: Never Used  . Alcohol Use: No    OB History    Grav Para Term Preterm Abortions TAB SAB Ect Mult Living   4 4 4       4       Review of Systems  Cardiovascular: Positive for chest pain.  All other systems reviewed and are negative.    Allergies  Ciprofloxacin; Codeine; Contrast media; Nsaids; Nutritional supplements; and Penicillins  Home Medications   Current Outpatient Rx  Name Route Sig Dispense Refill  . ACETAMINOPHEN 500 MG PO TABS Oral Take 500 mg by mouth every 6 (six) hours as needed. pain    . ATORVASTATIN CALCIUM 20 MG PO TABS Oral Take 20 mg by mouth at bedtime.      . CLOPIDOGREL BISULFATE 75 MG PO TABS Oral Take 1 tablet (75 mg total) by mouth daily. 30 tablet 6    Generic WUJ:WJXBJY    75MG  (TAB)  01/28/2012 9:54: ...  . ISOSORBIDE MONONITRATE ER 30 MG PO TB24 Oral Take 1 tablet (30 mg total) by mouth daily. 30 tablet 6  . LISINOPRIL 10 MG PO TABS Oral Take 1 tablet (10 mg  total) by mouth daily. 30 tablet 6    Generic GNF:AOZHYQM   10MG   . NITROGLYCERIN 0.4 MG SL SUBL Sublingual Place 0.4 mg under the tongue every 5 (five) minutes as needed.        BP 149/59  Pulse 67  Temp 98.1 F (36.7 C) (Oral)  Resp 20  SpO2 96%  Physical Exam  Nursing note and vitals reviewed. Constitutional: She appears well-developed and well-nourished. No distress.  HENT:  Head: Normocephalic and atraumatic.  Mouth/Throat: Oropharynx is clear and moist. No oropharyngeal exudate.  Eyes: Conjunctivae normal and EOM are normal. Pupils are equal, round, and reactive to light. Right eye exhibits no discharge. Left eye exhibits no discharge. No scleral icterus.  Neck: Normal range of motion. Neck supple. No JVD  present. No thyromegaly present.  Cardiovascular: Normal rate, regular rhythm, normal heart sounds and intact distal pulses.  Exam reveals no gallop and no friction rub.   No murmur heard. Pulmonary/Chest: Effort normal and breath sounds normal. No respiratory distress. She has no wheezes. She has no rales.  Abdominal: Soft. Bowel sounds are normal. She exhibits no distension and no mass. There is no tenderness.  Musculoskeletal: Normal range of motion. She exhibits no edema and no tenderness.  Lymphadenopathy:    She has no cervical adenopathy.  Neurological: She is alert. Coordination normal.  Skin: Skin is warm and dry. No rash noted. No erythema.  Psychiatric: She has a normal mood and affect. Her behavior is normal.    ED Course  Procedures (including critical care time)  Labs Reviewed  COMPREHENSIVE METABOLIC PANEL - Abnormal; Notable for the following:    Glucose, Bld 115 (*)     GFR calc non Af Amer 78 (*)     All other components within normal limits  CBC WITH DIFFERENTIAL  TROPONIN I  URINALYSIS, ROUTINE W REFLEX MICROSCOPIC  URINE CULTURE   Dg Chest Port 1 View  06/05/2012  *RADIOLOGY REPORT*  Clinical Data: Chest tightness.  PORTABLE CHEST - 1 VIEW  Comparison: 01/07/2012.  Findings: Normal sized heart.  Stable linear scarring at the right lung base.  Otherwise, clear lungs.  Diffuse osteopenia and mild scoliosis.  IMPRESSION: No acute abnormality.   Original Report Authenticated By: Darrol Angel, M.D.      1. Unstable angina       MDM  At this time the patient is well-appearing with no symptoms, no abnormal signs on her exam, clear heart and lung sounds and a normal EKG without any signs of ischemia, will rule out acute coronary syndrome with a troponin, chest x-ray, laboratory data.  ED ECG REPORT  I personally interpreted this EKG   Date: 06/06/2012   Rate: 68  Rhythm: normal sinus rhythm  QRS Axis: normal  Intervals: normal  ST/T Wave abnormalities:  normal  Conduction Disutrbances:none  Narrative Interpretation:   Old EKG Reviewed: compared with 06/22/2011, no significant changes   I believe that since the patient has had increased frequency and intensity of her chest pain this evening it qualifies as unstable angina. Despite having no acute findings on her EKG she could have coronary lesions requiring further evaluation thus I have consult the hospitalist Dr. Orvan Falconer to admit the patient to the hospital. He is in agreement and has requested holding orders.  CRITICAL CARE Performed by: Vida Roller   Total critical care time: 30  Critical care time was exclusive of separately billable procedures and treating other patients.  Critical care was necessary to  treat or prevent imminent or life-threatening deterioration.  Critical care was time spent personally by me on the following activities: development of treatment plan with patient and/or surrogate as well as nursing, discussions with consultants, evaluation of patient's response to treatment, examination of patient, obtaining history from patient or surrogate, ordering and performing treatments and interventions, ordering and review of laboratory studies, ordering and review of radiographic studies, pulse oximetry and re-evaluation of patient's condition.   Vida Roller, MD 06/06/12 [phone]

## 2012-06-05 NOTE — ED Notes (Addendum)
Pt c/o chest tightness. Pt states she was getting ready for bed and sx started. Pt took 1 nitro at home with relief.

## 2012-06-06 ENCOUNTER — Encounter (HOSPITAL_COMMUNITY): Payer: Self-pay

## 2012-06-06 DIAGNOSIS — I200 Unstable angina: Secondary | ICD-10-CM

## 2012-06-06 DIAGNOSIS — F411 Generalized anxiety disorder: Secondary | ICD-10-CM

## 2012-06-06 DIAGNOSIS — D649 Anemia, unspecified: Secondary | ICD-10-CM | POA: Diagnosis not present

## 2012-06-06 DIAGNOSIS — I2510 Atherosclerotic heart disease of native coronary artery without angina pectoris: Secondary | ICD-10-CM

## 2012-06-06 DIAGNOSIS — R001 Bradycardia, unspecified: Secondary | ICD-10-CM | POA: Diagnosis present

## 2012-06-06 DIAGNOSIS — I10 Essential (primary) hypertension: Secondary | ICD-10-CM

## 2012-06-06 DIAGNOSIS — F419 Anxiety disorder, unspecified: Secondary | ICD-10-CM | POA: Diagnosis present

## 2012-06-06 LAB — COMPREHENSIVE METABOLIC PANEL
ALT: 8 U/L (ref 0–35)
BUN: 11 mg/dL (ref 6–23)
Calcium: 8.5 mg/dL (ref 8.4–10.5)
GFR calc Af Amer: >90 mL/min (ref >90)
Glucose, Bld: 90 mg/dL (ref 70–99)
Sodium: 139 mEq/L (ref 135–145)
Total Protein: 5.7 g/dL — ABNORMAL LOW (ref 6.0–8.3)

## 2012-06-06 LAB — TROPONIN I
Troponin I: <0.3 ng/mL (ref <0.30)
Troponin I: <0.3 ng/mL (ref <0.30)

## 2012-06-06 LAB — URINALYSIS, ROUTINE W REFLEX MICROSCOPIC
Bilirubin Urine: NEGATIVE
Ketones, ur: NEGATIVE mg/dL
Nitrite: NEGATIVE
pH: 6.5 (ref 5.0–8.0)

## 2012-06-06 LAB — HEPARIN LEVEL (UNFRACTIONATED): Heparin Unfractionated: 1 IU/mL — ABNORMAL HIGH (ref 0.30–0.70)

## 2012-06-06 LAB — CBC
Hemoglobin: 11.6 g/dL — ABNORMAL LOW (ref 12.0–15.0)
MCH: 31.1 pg (ref 26.0–34.0)
MCHC: 32.7 g/dL (ref 30.0–36.0)

## 2012-06-06 LAB — HEMOGLOBIN A1C: Hgb A1c MFr Bld: 5.8 % — ABNORMAL HIGH (ref <5.7)

## 2012-06-06 MED ORDER — ENOXAPARIN SODIUM 30 MG/0.3ML ~~LOC~~ SOLN
30.0000 mg | SUBCUTANEOUS | Status: DC
  Administered 2012-06-06: 30 mg via SUBCUTANEOUS
  Filled 2012-06-06: qty 0.3

## 2012-06-06 MED ORDER — LISINOPRIL 10 MG PO TABS
10.0000 mg | ORAL_TABLET | Freq: Every day | ORAL | Status: DC
  Administered 2012-06-06 – 2012-06-07 (×2): 10 mg via ORAL
  Filled 2012-06-06 (×2): qty 1

## 2012-06-06 MED ORDER — ASPIRIN 81 MG PO CHEW
324.0000 mg | CHEWABLE_TABLET | Freq: Once | ORAL | Status: AC
  Administered 2012-06-06: 324 mg via ORAL
  Filled 2012-06-06: qty 4

## 2012-06-06 MED ORDER — ALPRAZOLAM 0.25 MG PO TABS
0.2500 mg | ORAL_TABLET | Freq: Three times a day (TID) | ORAL | Status: DC | PRN
  Administered 2012-06-06: 0.25 mg via ORAL
  Filled 2012-06-06: qty 1

## 2012-06-06 MED ORDER — POLYETHYLENE GLYCOL 3350 17 G PO PACK: 17.0000 g | PACK | Freq: Every day | ORAL | Status: DC | PRN

## 2012-06-06 MED ORDER — SODIUM CHLORIDE 0.9 % IJ SOLN
INTRAMUSCULAR | Status: AC
  Filled 2012-06-06: qty 3

## 2012-06-06 MED ORDER — PANTOPRAZOLE SODIUM 40 MG PO TBEC
40.0000 mg | DELAYED_RELEASE_TABLET | Freq: Every day | ORAL | Status: DC
  Administered 2012-06-06: 40 mg via ORAL
  Filled 2012-06-06: qty 1

## 2012-06-06 MED ORDER — POTASSIUM CHLORIDE IN NACL 20-0.9 MEQ/L-% IV SOLN
INTRAVENOUS | Status: DC
  Administered 2012-06-06: 03:00:00 via INTRAVENOUS

## 2012-06-06 MED ORDER — ONDANSETRON HCL 4 MG/2ML IJ SOLN: 4.0000 mg | Freq: Four times a day (QID) | INTRAMUSCULAR | Status: DC | PRN

## 2012-06-06 MED ORDER — TRAZODONE HCL 50 MG PO TABS: 25.0000 mg | ORAL_TABLET | Freq: Every evening | ORAL | Status: DC | PRN

## 2012-06-06 MED ORDER — HYDROMORPHONE HCL PF 1 MG/ML IJ SOLN: 0.5000 mg | INTRAMUSCULAR | Status: DC | PRN

## 2012-06-06 MED ORDER — BISACODYL 10 MG RE SUPP: 10.0000 mg | Freq: Every day | RECTAL | Status: DC | PRN

## 2012-06-06 MED ORDER — HEPARIN BOLUS VIA INFUSION
3000.0000 [IU] | Freq: Once | INTRAVENOUS | Status: AC
  Administered 2012-06-06: 3000 [IU] via INTRAVENOUS
  Filled 2012-06-06: qty 3000

## 2012-06-06 MED ORDER — SODIUM CHLORIDE 0.9 % IJ SOLN
3.0000 mL | Freq: Two times a day (BID) | INTRAMUSCULAR | Status: DC
  Administered 2012-06-06 – 2012-06-07 (×2): 3 mL via INTRAVENOUS
  Filled 2012-06-06 (×2): qty 3

## 2012-06-06 MED ORDER — HEPARIN (PORCINE) IN NACL 100-0.45 UNIT/ML-% IJ SOLN
550.0000 [IU]/h | INTRAMUSCULAR | Status: DC
  Administered 2012-06-06: 800 [IU]/h via INTRAVENOUS
  Filled 2012-06-06: qty 250

## 2012-06-06 MED ORDER — ACETAMINOPHEN 325 MG PO TABS: 650.0000 mg | ORAL_TABLET | ORAL | Status: DC | PRN

## 2012-06-06 MED ORDER — CLOPIDOGREL BISULFATE 75 MG PO TABS
75.0000 mg | ORAL_TABLET | Freq: Every day | ORAL | Status: DC
  Administered 2012-06-06 – 2012-06-07 (×2): 75 mg via ORAL
  Filled 2012-06-06 (×2): qty 1

## 2012-06-06 MED ORDER — ISOSORBIDE MONONITRATE ER 60 MG PO TB24
30.0000 mg | ORAL_TABLET | Freq: Every day | ORAL | Status: DC
  Administered 2012-06-06 – 2012-06-07 (×2): 30 mg via ORAL
  Filled 2012-06-06 (×2): qty 1

## 2012-06-06 MED ORDER — ONDANSETRON HCL 4 MG/2ML IJ SOLN: 4.0000 mg | Freq: Three times a day (TID) | INTRAMUSCULAR | Status: DC | PRN

## 2012-06-06 MED ORDER — NITROGLYCERIN 0.4 MG SL SUBL: 0.4000 mg | SUBLINGUAL_TABLET | SUBLINGUAL | Status: DC | PRN

## 2012-06-06 MED ORDER — ALUM & MAG HYDROXIDE-SIMETH 200-200-20 MG/5ML PO SUSP: 15.0000 mL | Freq: Four times a day (QID) | ORAL | Status: DC | PRN

## 2012-06-06 MED ORDER — ATORVASTATIN CALCIUM 20 MG PO TABS
20.0000 mg | ORAL_TABLET | Freq: Every day | ORAL | Status: DC
  Administered 2012-06-06: 20 mg via ORAL
  Filled 2012-06-06: qty 1

## 2012-06-06 MED ORDER — FLEET ENEMA 7-19 GM/118ML RE ENEM: 1.0000 | ENEMA | Freq: Once | RECTAL | Status: AC | PRN

## 2012-06-06 NOTE — H&P (Signed)
[facility] History and Physical  KETTY BITTON  AVW:[id number]  DOB: Jan 28, 1931   DOA: 06/06/2012   PCP:   Rudi Heap, MD   Chief Complaint:  Episode of chest pain this evening  HPI: [name] is an 78 y.o. female.   Caucasian lady with a history of coronary artery disease, and infrequent episodic chest discomfort, was brought to the emergency room by her son with whom she lives, complaining of sudden onset of severe central chest tightness while at rest in bed, relieved by nitroglycerin.. pain was apparently at a 7/10 and after nitroglycerin became a 3/10.; By the time she arrived to the emergency room the pain subsided subsided substantially but she was still aware of its presence.  The pain was not associated with nausea or vomiting or diaphoresis; she felt weak but not lightheaded; she feels she may have been slightly short of breath. She denies any similar pains but she became very panicky with the onset of the  In the emergency room patient's a cardiac enzymes and EKG were unremarkable, but because of her cardiac history the hospitalist service was called to assist with management  She denies fever cough or cold, denies passage of bloody or black stool  Rewiew of Systems:   All systems negative except as marked bold or noted in the HPI;  Constitutional: Negative for malaise, fever and chills. ;  Eyes: Negative for eye pain, redness and discharge. ;  ENMT: Negative for ear pain, hoarseness, nasal congestion, sinus pressure and sore throat. ;  Cardiovascular: Negative for chest pain, palpitations, diaphoresis, dyspnea and peripheral edema. ;  Respiratory: Negative for cough, hemoptysis, wheezing and stridor. ;  Gastrointestinal: Negative for nausea, vomiting, diarrhea, constipation, abdominal pain, melena, blood in stool, hematemesis, jaundice and rectal bleeding. unusual weight loss..   Genitourinary: Negative for frequency, dysuria, incontinence,flank pain and  hematuria; Musculoskeletal: Negative for back pain and neck pain. Negative for swelling and trauma.;  Skin: . Negative for pruritus, rash, abrasions, bruising and skin lesion.; ulcerations Neuro: Negative for headache, lightheadedness and neck stiffness. Negative for weakness, altered level of consciousness , altered mental status, extremity weakness, burning feet, involuntary movement, seizure and syncope.  Psych: negative for  depression, insomnia, tearfulness, panic attacks, hallucinations, paranoia, suicidal or homicidal ideation    Past Medical History  Diagnosis Date  . Coronary atherosclerosis of native coronary artery     DES LAD and BMS LAD 8/08  . Mixed hyperlipidemia   . GERD (gastroesophageal reflux disease)   . Essential hypertension, benign   . Anxiety   . Resting tremor   . COPD (chronic obstructive pulmonary disease)   . Rickets   . MI (myocardial infarction) 2008    Past Surgical History  Procedure Date  . Appendectomy   . Cholecystectomy   . Bilateral cataract surgery   . Vein ligation and stripping   . Cardiac catheterization   . Coronary angioplasty with stent placement   . Left trochanteric bursa injection 11/27/11    DR. O'TOOLE  . Left sacroiliac joint injection 11/27/11    DR. O'TOOLE    Medications:  HOME MEDS: Prior to Admission medications   Medication Sig Start Date End Date Taking? Authorizing Provider  acetaminophen (TYLENOL) 500 MG tablet Take 500 mg by mouth every 6 (six) hours as needed. pain   Yes Historical Provider, MD  atorvastatin (LIPITOR) 20 MG tablet Take 20 mg by mouth at bedtime.     Yes Historical Provider, MD  clopidogrel (PLAVIX)  75 MG tablet Take 1 tablet (75 mg total) by mouth daily. 01/28/12  Yes Jonelle Sidle, MD  isosorbide mononitrate (IMDUR) 30 MG 24 hr tablet Take 1 tablet (30 mg total) by mouth daily. 10/15/11  Yes Jonelle Sidle, MD  lisinopril (PRINIVIL,ZESTRIL) 10 MG tablet Take 1 tablet (10 mg total) by mouth  daily. 03/15/12  Yes Jonelle Sidle, MD  nitroGLYCERIN (NITROSTAT) 0.4 MG SL tablet Place 0.4 mg under the tongue every 5 (five) minutes as needed.     Yes Historical Provider, MD     Allergies:  Allergies  Allergen Reactions  . Ciprofloxacin   . Codeine     REACTION: shakiness  . Contrast Media (Iodinated Diagnostic Agents)   . Nsaids   . Nutritional Supplements   . Penicillins     REACTION: shakiness    Social History:   reports that she quit smoking about 38 years ago. Her smoking use included Cigarettes. She has a 6.9 pack-year smoking history. She has never used smokeless tobacco. She reports that she does not drink alcohol or use illicit drugs.  Family History: Family History  Problem Relation Age of Onset  . Hypertension       Physical Exam: Filed Vitals:   06/06/12 0014 06/06/12 0100 06/06/12 0155 06/06/12 0156  BP: 131/55 144/59  156/81  Pulse: 64 62  57  Temp:    97.9 F (36.6 C)  TempSrc:    Oral  Resp: 18 15  16   Height:   5\' 4"  (1.626 m)   Weight:   65.5 kg (144 lb 6.4 oz)   SpO2: 97% 96%  97%   Blood pressure 156/81, pulse 57, temperature 97.9 F (36.6 C), temperature source Oral, resp. rate 16, height 5\' 4"  (1.626 m), weight 65.5 kg (144 lb 6.4 oz), SpO2 97.00%.  GEN:  Pleasant but apprehensive elderly Caucasian lady lying in the stretcher in no acute distress; cooperative with exam PSYCH:  alert and oriented x4;; appears very anxious; affect is appropriate. HEENT: Mucous membranes pink and anicteric; PERRLA; EOM intact; no cervical lymphadenopathy nor thyromegaly or carotid bruit; no JVD; Breasts:: Not examined CHEST WALL: No tenderness CHEST: Normal respiration, clear to auscultation bilaterally HEART: Regular rate and rhythm; no murmurs rubs or gallops BACK: Moderate kyphosis or scoliosis; no CVA tenderness ABDOMEN: Obese, soft non-tender; no masses, no organomegaly, normal abdominal bowel sounds;  no intertriginous candida. Rectal Exam: Not  done EXTREMITIES: ; age-appropriate arthropathy of the hands and knees; no edema; no ulcerations. Genitalia: not examined PULSES: 2+ and symmetric SKIN: Normal hydration no rash or ulceration CNS: Cranial nerves 2-12 grossly intact no focal lateralizing neurologic deficit   Labs on Admission:  Basic Metabolic Panel:  Lab 06/05/12 1610  NA 136  K 3.8  CL 101  CO2 26  GLUCOSE 115*  BUN 14  CREATININE 0.76  CALCIUM 9.2  MG --  PHOS --   Liver Function Tests:  Lab 06/05/12 2221  AST 14  ALT 9  ALKPHOS 66  BILITOT 0.4  PROT 6.6  ALBUMIN 3.7   No results found for this basename: LIPASE:5,AMYLASE:5 in the last 168 hours No results found for this basename: AMMONIA:5 in the last 168 hours CBC:  Lab 06/05/12 2221  WBC 7.1  NEUTROABS 4.4  HGB 12.4  HCT 37.1  MCV 94.4  PLT 215   Cardiac Enzymes:  Lab 06/05/12 2221  CKTOTAL --  CKMB --  CKMBINDEX --  TROPONINI <0.30   BNP: No components  found with this basename: POCBNP:5 D-dimer: No components found with this basename: D-DIMER:5 CBG: No results found for this basename: GLUCAP:5 in the last 168 hours  Radiological Exams on Admission: Dg Chest Port 1 View  06/05/2012  *RADIOLOGY REPORT*  Clinical Data: Chest tightness.  PORTABLE CHEST - 1 VIEW  Comparison: 01/07/2012.  Findings: Normal sized heart.  Stable linear scarring at the right lung base.  Otherwise, clear lungs.  Diffuse osteopenia and mild scoliosis.  IMPRESSION: No acute abnormality.   Original Report Authenticated By: Darrol Angel, M.D.     EKG: Independently reviewed. Normal sinus rhythm with no ST segment abnormalities   Assessment/Plan Present on Admission:  .Unstable angina .Anxiety .HYPERLIPIDEMIA .Essential hypertension, benign .CORONARY ATHEROSCLEROSIS NATIVE CORONARY ARTERY .GERD    PLAN:  Bring this lady in on observation to rule out acute coronary syndrome; if she continues to remain symptom free EKG remains normal and cardiac  enzymes remain negative, she will likely benefit from an outpatient cardiology followup Other plans as per orders.  Code Status: FULL CODE  Family Communication: Care discussed with patient; her son is the same home number is her; Disposition Plan: Home within 24-48 hours if cardiac evaluation remains   Sophy Mesler Nocturnist [facility] Pager [phone]   06/06/2012, 2:16 AM

## 2012-06-06 NOTE — ED Notes (Signed)
Patient walk to bathroom with assist.

## 2012-06-06 NOTE — Progress Notes (Signed)
The patient is a 78 year old woman with a history significant for coronary artery disease, status post stenting in 2012, who was admitted this morning by Dr. Orvan Falconer for chest pain. She was diagnosed with unstable angina. Currently, she is chest pain-free. She was briefly seen. Her chart, vitals signs, laboratory studies were reviewed. In the setting of possible unstable angina, will add intravenous heparin. We'll continue her chronic medications. We'll hold off on beta blocker therapy due to to her bradycardia. We'll add Protonix empirically. We'll continue with cardiac markers as ordered. We will order a followup EKG in the morning. We'll order fasting lipid profile in the morning. Will consult cardiology in the morning.

## 2012-06-06 NOTE — Progress Notes (Signed)
ANTICOAGULATION CONSULT NOTE - Initial Consult  Pharmacy Consult for Heparin Indication: Unstable angina  Allergies  Allergen Reactions  . Ciprofloxacin   . Codeine     REACTION: shakiness  . Contrast Media (Iodinated Diagnostic Agents)   . Nsaids   . Nutritional Supplements   . Penicillins     REACTION: shakiness    Patient Measurements: Height: 5\' 4"  (162.6 cm) Weight: 147 lb 8 oz (66.906 kg) IBW/kg (Calculated) : 54.7  Heparin Dosing Weight: 66.9 kg  Vital Signs: Temp: 98 F (36.7 C) (10/06 1331) Temp src: Oral (10/06 1331) BP: 133/67 mmHg (10/06 1331) Pulse Rate: 65  (10/06 1331)  Labs:  Basename 06/06/12 1946 06/06/12 1750 06/06/12 1021 06/06/12 0450 06/05/12 2221  HGB -- -- -- 11.6* 12.4  HCT -- -- -- 35.5* 37.1  PLT -- -- -- 195 215  APTT -- -- -- -- --  LABPROT -- -- -- -- --  INR -- -- -- -- --  HEPARINUNFRC 1.00* -- -- -- --  CREATININE -- -- -- 0.71 0.76  CKTOTAL -- -- -- -- --  CKMB -- -- -- -- --  TROPONINI -- <0.30 <0.30 <0.30 --    Estimated Creatinine Clearance: 53.7 ml/min (by C-G formula based on Cr of 0.71).   Medical History: Past Medical History  Diagnosis Date  . Coronary atherosclerosis of native coronary artery     DES LAD and BMS LAD 8/08  . Mixed hyperlipidemia   . GERD (gastroesophageal reflux disease)   . Essential hypertension, benign   . Anxiety   . Resting tremor   . COPD (chronic obstructive pulmonary disease)   . Rickets   . MI (myocardial infarction) 2008    Medications:  Scheduled:     . aspirin  324 mg Oral Once  . atorvastatin  20 mg Oral QHS  . clopidogrel  75 mg Oral Daily  . enoxaparin (LOVENOX) injection  30 mg Subcutaneous Q24H  . heparin  3,000 Units Intravenous Once  . isosorbide mononitrate  30 mg Oral Daily  . lisinopril  10 mg Oral Daily  . pantoprazole  40 mg Oral Q1200  . sodium chloride  3 mL Intravenous Q12H  . sodium chloride      . sodium chloride        Assessment: Heparin level  above goal History of coronary artery disease Status post stent 2012 Unstable angina Cardiac markers ordered  Goal of Therapy:  Heparin level 0.3-0.7 units/ml Monitor platelets by anticoagulation protocol: Yes   Plan: Reduce Heparin rate to 550 units/hr Heparin level at 5 AM and then daily Labs per protocol Monitor platelets  [name], [name] 06/06/2012,8:33 PM

## 2012-06-06 NOTE — Progress Notes (Signed)
ANTICOAGULATION CONSULT NOTE - Initial Consult  Pharmacy Consult for Heparin Indication: Unstable angina  Allergies  Allergen Reactions  . Ciprofloxacin   . Codeine     REACTION: shakiness  . Contrast Media (Iodinated Diagnostic Agents)   . Nsaids   . Nutritional Supplements   . Penicillins     REACTION: shakiness    Patient Measurements: Height: 5\' 4"  (162.6 cm) Weight: 147 lb 8 oz (66.906 kg) IBW/kg (Calculated) : 54.7  Heparin Dosing Weight: 66.9 kg  Vital Signs: Temp: 97.9 F (36.6 C) (10/06 0455) Temp src: Oral (10/06 0455) BP: 132/77 mmHg (10/06 0455) Pulse Rate: 55  (10/06 0455)  Labs:  Basename 06/06/12 0450 06/05/12 2221  HGB 11.6* 12.4  HCT 35.5* 37.1  PLT 195 215  APTT -- --  LABPROT -- --  INR -- --  HEPARINUNFRC -- --  CREATININE 0.71 0.76  CKTOTAL -- --  CKMB -- --  TROPONINI <0.30 <0.30    Estimated Creatinine Clearance: 53.7 ml/min (by C-G formula based on Cr of 0.71).   Medical History: Past Medical History  Diagnosis Date  . Coronary atherosclerosis of native coronary artery     DES LAD and BMS LAD 8/08  . Mixed hyperlipidemia   . GERD (gastroesophageal reflux disease)   . Essential hypertension, benign   . Anxiety   . Resting tremor   . COPD (chronic obstructive pulmonary disease)   . Rickets   . MI (myocardial infarction) 2008    Medications:  Scheduled:    . aspirin  324 mg Oral Once  . atorvastatin  20 mg Oral QHS  . clopidogrel  75 mg Oral Daily  . enoxaparin (LOVENOX) injection  30 mg Subcutaneous Q24H  . heparin  3,000 Units Intravenous Once  . isosorbide mononitrate  30 mg Oral Daily  . lisinopril  10 mg Oral Daily  . pantoprazole  40 mg Oral Q1200  . sodium chloride  3 mL Intravenous Q12H  . sodium chloride      . sodium chloride        Assessment: History of coronary artery disease Status post stent 2012 Unstable angina Cardiac markers ordered  Goal of Therapy:  Heparin level 0.3-0.7 units/ml Monitor  platelets by anticoagulation protocol: Yes   Plan:  Heparin 3000 Unit IV bolus, then 12 units/kg/hr (800 Units/hr) infusion Heparin level 630 PM today and then daily Labs per protocol Monitor platelets  Raquel James, Srinivas Lippman Bennett 06/06/2012,9:39 AM

## 2012-06-07 DIAGNOSIS — I498 Other specified cardiac arrhythmias: Secondary | ICD-10-CM

## 2012-06-07 DIAGNOSIS — D649 Anemia, unspecified: Secondary | ICD-10-CM

## 2012-06-07 LAB — CBC
HCT: 36.1 % (ref 36.0–46.0)
Hemoglobin: 12 g/dL (ref 12.0–15.0)
MCH: 31.8 pg (ref 26.0–34.0)
MCHC: 33.2 g/dL (ref 30.0–36.0)
RDW: 13.8 % (ref 11.5–15.5)

## 2012-06-07 LAB — BASIC METABOLIC PANEL
BUN: 11 mg/dL (ref 6–23)
Calcium: 8.5 mg/dL (ref 8.4–10.5)
GFR calc non Af Amer: 79 mL/min — ABNORMAL LOW (ref >90)
Glucose, Bld: 89 mg/dL (ref 70–99)

## 2012-06-07 MED ORDER — PANTOPRAZOLE SODIUM 40 MG PO TBEC: 40.0000 mg | DELAYED_RELEASE_TABLET | Freq: Every day | ORAL | Status: DC

## 2012-06-07 NOTE — Discharge Summary (Signed)
Physician Discharge Summary  [name]:[id number] DOB: April 19, 1931 DOA: 06/05/2012  PCP: Rudi Heap, MD  Admit date: 06/05/2012 Discharge date: 06/07/2012  Recommendations for Outpatient Follow-up:  1. The patient was discharged to home in improved and stable condition. She will followup with her primary care provider Ms. Steadman and cardiologist, Dr. Diona Browner as scheduled.  Discharge Diagnoses:  1. Chest pain, possibly secondary to unstable angina. 2. Underlying coronary artery disease. 3. Mild bradycardia with a heart rate ranging from 55 beats per minute to 60 beats per minute. The patient's TSH was within normal limits at 2.7. 4. Gastroesophageal reflux disease. 5. Anemia, likely dilutional. 6. Chronic anxiety. 7. Hypertension. 8. Hyperlipidemia. The patient's fasting lipid profile revealed total cholesterol 135, triglycerides of 87, HDL cholesterol 63, and LDL cholesterol of 85.  Discharge Condition:  Improved.  Diet recommendation: Heart healthy.  Filed Weights   06/06/12 0155 06/06/12 0455  Weight: 65.5 kg (144 lb 6.4 oz) 66.906 kg (147 lb 8 oz)    History of present illness:  The patient is a 78 year old woman with a history significant for hypertension and coronary artery disease, who presented to the emergency department on 06/05/2012 with a chief complaint of chest pain. Her chest pain was relieved with one sublingual nitroglycerin at home. In the emergency department, she was afebrile and hemodynamically stable. Her lab data were significant for a negative troponin I., mildly elevated glucose of 115, and normal LFTs. Her EKG revealed normal sinus rhythm without ST or T wave abnormalities. Her chest x-ray revealed no acute abnormalities. She was admitted for further evaluation and management.  Hospital Course:  The patient was started on treatment for presumed unstable angina with IV heparin, when necessary sublingual nitroglycerin, and all of her other chronic  cardiac medications. Gentle IV fluids were started. Additional analgesic medications were ordered as needed. Antiemetic therapy was ordered as needed. Proton pump inhibitor therapy was added given her history of gastroesophageal reflux disease. Because of her chronic anxiety, as needed Xanax was added. Beta blockade therapy was considered but was not started because the patient was bradycardic with a heart rate ranging in low to mid 50s.    For further evaluation, a number of studies were ordered. Her troponin I was negative x4. Her hemoglobin A1c was within normal limits at 5.8. Her TSH was within normal limits at 2.7. Her urinalysis was essentially negative. The results of her fasting lipid profile were dictated above.  Cardiology was consulted. Dr. Tenny Craw provided the cardiology assessment and recommendations. Per her assessment, she was not convinced that the patient's chest pain was representative of unstable angina. She noted that the patient ruled out for a myocardial infarction. She questioned if the patient's chest pain was  GI in origin given that the patient had recently been started on antibiotic for treatment of a urinary tract infection. She recommended discontinuing the heparin and IV fluids. She instructed the patient to ambulate  with the nursing staff. If she did not have any chest pain, she could be discharged to home with close followup with cardiology. The patient did ambulate in the hallway and had no complaints of chest pain.  The patient remained afebrile and hemodynamically stable. She was chest pain-free during the hospitalization. She was discharged in improved condition.   Procedures:  None   Consultations:  Cardiologist, Huston Foley M.D.   Discharge Exam: Filed Vitals:   06/06/12 1331 06/06/12 2146 06/07/12 0245 06/07/12 0450  BP: 133/67 126/74 126/71 153/73  Pulse: 65  62 60 59  Temp: 98 F (36.7 C) 97.3 F (36.3 C) 97.6 F (36.4 C) 97.8 F (36.6 C)  TempSrc: Oral  Oral Oral Oral  Resp: 18 19 17 18   Height:      Weight:      SpO2: 97% 96% 95% 96%    General: Pleasant 78 year old Caucasian woman sitting up in bed, in no acute distress.  Cardiovascular:S1, S2, with no murmurs rubs or gallops.  Respiratory: Clear to auscultation bilaterally.   Discharge Instructions  Discharge Orders    Future Appointments: Provider: Department: Dept Phone: Center:   07/26/2012 11:00 AM Jonelle Sidle, MD [facility] [phone] [facility]     Future Orders Please Complete By Expires   Diet - low sodium heart healthy      Increase activity slowly          Medication List     As of 06/07/2012 12:46 PM    TAKE these medications         acetaminophen 500 MG tablet   Commonly known as: TYLENOL   Take 500 mg by mouth every 6 (six) hours as needed. pain      atorvastatin 20 MG tablet   Commonly known as: LIPITOR   Take 20 mg by mouth at bedtime.      clopidogrel 75 MG tablet   Commonly known as: PLAVIX   Take 1 tablet (75 mg total) by mouth daily.      isosorbide mononitrate 30 MG 24 hr tablet   Commonly known as: IMDUR   Take 1 tablet (30 mg total) by mouth daily.      lisinopril 10 MG tablet   Commonly known as: PRINIVIL,ZESTRIL   Take 1 tablet (10 mg total) by mouth daily.      nitroGLYCERIN 0.4 MG SL tablet   Commonly known as: NITROSTAT   Place 0.4 mg under the tongue every 5 (five) minutes as needed.      pantoprazole 40 MG tablet   Commonly known as: PROTONIX   Take 1 tablet (40 mg total) by mouth daily at 12 noon.           Follow-up Information    Follow up with Allie Dimmer, OTR. (Followup as scheduled.)    Contact information:   [address] [place] [address] [phone]       Follow up with Nona Dell, MD. (Followup as scheduled.)    Contact information:   I           The results of significant diagnostics from this hospitalization (including imaging, microbiology,  ancillary and laboratory) are listed below for reference.    Significant Diagnostic Studies: Dg Chest Port 1 View  06/05/2012  *RADIOLOGY REPORT*  Clinical Data: Chest tightness.  PORTABLE CHEST - 1 VIEW  Comparison: 01/07/2012.  Findings: Normal sized heart.  Stable linear scarring at the right lung base.  Otherwise, clear lungs.  Diffuse osteopenia and mild scoliosis.  IMPRESSION: No acute abnormality.   Original Report Authenticated By: Darrol Angel, M.D.     Microbiology: No results found for this or any previous visit (from the past 240 hour(s)).   Labs: Basic Metabolic Panel:  Lab 06/07/12 2355 06/06/12 0450 06/05/12 2221  NA 140 139 136  K 3.9 3.7 3.8  CL 108 107 101  CO2 23 25 26   GLUCOSE 89 90 115*  BUN 11 11 14   CREATININE 0.74 0.71 0.76  CALCIUM 8.5 8.5 9.2  MG -- -- --  PHOS -- -- --   Liver Function Tests:  Lab 06/06/12 0450 06/05/12 2221  AST 12 14  ALT 8 9  ALKPHOS 54 66  BILITOT 0.4 0.4  PROT 5.7* 6.6  ALBUMIN 3.1* 3.7   No results found for this basename: LIPASE:5,AMYLASE:5 in the last 168 hours No results found for this basename: AMMONIA:5 in the last 168 hours CBC:  Lab 06/07/12 0528 06/06/12 0450 06/05/12 2221  WBC 5.7 6.0 7.1  NEUTROABS -- -- 4.4  HGB 12.0 11.6* 12.4  HCT 36.1 35.5* 37.1  MCV 95.8 95.2 94.4  PLT 198 195 215   Cardiac Enzymes:  Lab 06/06/12 1750 06/06/12 1021 06/06/12 0450 06/05/12 2221  CKTOTAL -- -- -- --  CKMB -- -- -- --  CKMBINDEX -- -- -- --  TROPONINI <0.30 <0.30 <0.30 <0.30   BNP: BNP (last 3 results) No results found for this basename: PROBNP:3 in the last 8760 hours CBG: No results found for this basename: GLUCAP:5 in the last 168 hours  Time coordinating discharge:  greater than 30 minutes  Signed:  Carissa Musick  [facility] 06/07/2012, 12:46 PM

## 2012-06-07 NOTE — Progress Notes (Signed)
UR Chart Review Completed  

## 2012-06-07 NOTE — Progress Notes (Signed)
D/c instructions reviewed with patient and son.  Verbalized understanding.  Pt dc'd to home with son.  Schonewitz, Candelaria Stagers 06/07/2012

## 2012-06-07 NOTE — Consult Note (Signed)
Patient ID: KAILEE GARNO MRN: [id number], DOB/AGE: 04-04-1931   Admit date: 06/05/2012 Date of Consult: @TODAY @  Primary Physician: Rudi Heap, MD Primary Cardiologist: Diona Browner   Problem List: Past Medical History  Diagnosis Date  . Coronary atherosclerosis of native coronary artery     DES LAD and BMS LAD 8/08  . Mixed hyperlipidemia   . GERD (gastroesophageal reflux disease)   . Essential hypertension, benign   . Anxiety   . Resting tremor   . COPD (chronic obstructive pulmonary disease)   . Rickets   . MI (myocardial infarction) 2008    Past Surgical History  Procedure Date  . Appendectomy   . Cholecystectomy   . Bilateral cataract surgery   . Vein ligation and stripping   . Cardiac catheterization   . Coronary angioplasty with stent placement   . Left trochanteric bursa injection 11/27/11    DR. O'TOOLE  . Left sacroiliac joint injection 11/27/11    DR. O'TOOLE     Allergies:  Allergies  Allergen Reactions  . Ciprofloxacin   . Codeine     REACTION: shakiness  . Contrast Media (Iodinated Diagnostic Agents)   . Nsaids   . Nutritional Supplements   . Penicillins     REACTION: shakiness    HPI:  Patient is a 41 yar old with a history of HTN, CAD, Hyperlipidemia.  She is followed by [facility] in clinic, last seen in May 2013.  At that time had only occasional chest pains.  Last cath was in October 2012.  Found to have moderate disease of small D1, patent stents to LAD.  Otherwise nonobstructive CAD  Negative stress echo in Sept 2012. She was admitted on 10.6 with CP.  At home while in bed developped 7/10 PC  Took NTG  Went to 3/10.   Almost gone when arrived at ER.   On talking to patient pain is somewhat different than previous but somewhat alike.  No SOB when had spell.  Patient notes not change in her intermittent chest pains before this.   Does note some fatgiue recently but no SOB  NO PND Just went to [place] on Sat.  Put on new ABX for UTI  Took  [place].    Inpatient Medications:    . atorvastatin  20 mg Oral QHS  . clopidogrel  75 mg Oral Daily  . heparin  3,000 Units Intravenous Once  . isosorbide mononitrate  30 mg Oral Daily  . lisinopril  10 mg Oral Daily  . pantoprazole  40 mg Oral Q1200  . sodium chloride  3 mL Intravenous Q12H  . sodium chloride      . sodium chloride      . DISCONTD: enoxaparin (LOVENOX) injection  30 mg Subcutaneous Q24H    Family History  Problem Relation Age of Onset  . Hypertension       History   Social History  . Marital Status: Widowed    Spouse Name: N/A    Number of Children: N/A  . Years of Education: N/A   Occupational History  . Not on file.   Social History Main Topics  . Smoking status: Former Smoker -- 0.3 packs/day for 23 years    Types: Cigarettes    Quit date: 09/01/1973  . Smokeless tobacco: Never Used  . Alcohol Use: No  . Drug Use: No  . Sexually Active: No   Other Topics Concern  . Not on file   Social History Narrative  . No  narrative on file     Review of Systems: All other systems reviewed and are otherwise negative except as noted above.  Physical Exam: Filed Vitals:   06/07/12 0450  BP: 153/73  Pulse: 59  Temp: 97.8 F (36.6 C)  Resp: 18    Intake/Output Summary (Last 24 hours) at 06/07/12 1610 Last data filed at 06/07/12 0600  Gross per 24 hour  Intake 1681.36 ml  Output    800 ml  Net 881.36 ml    General:  Patient is a thin 78 year old  in no acute distress. Head: Normocephalic, atraumatic, sclera non-icteric Neck: Negative for carotid bruits. JVP not elevated. Lungs: Clear bilaterally to auscultation without wheezes, rales, or rhonchi. Breathing is unlabored. Heart: RRR with S1 S2. No murmurs, rubs, or gallops appreciated. Abdomen: Soft, non-tender, non-distended with normoactive bowel sounds. No hepatomegaly. No rebound/guarding. No obvious abdominal masses. Msk:  Strength and tone appears normal for age. Extremities: No  clubbing, cyanosis or edema.  Distal pedal pulses are 2+ and equal bilaterally. Neuro: Alert and oriented X 3. Moves all extremities spontaneously. Psych:  Responds to questions appropriately with a normal affect.  Labs: Results for orders placed during the hospital encounter of 06/05/12 (from the past 24 hour(s))  TROPONIN I     Status: Normal   Collection Time   06/06/12 10:21 AM      Component Value Range   Troponin I <0.30  <0.30 ng/mL  TROPONIN I     Status: Normal   Collection Time   06/06/12  5:50 PM      Component Value Range   Troponin I <0.30  <0.30 ng/mL  HEPARIN LEVEL (UNFRACTIONATED)     Status: Abnormal   Collection Time   06/06/12  7:46 PM      Component Value Range   Heparin Unfractionated 1.00 (*) 0.30 - 0.70 IU/mL  LIPID PANEL     Status: Normal   Collection Time   06/07/12  5:28 AM      Component Value Range   Cholesterol 135  0 - 200 mg/dL   Triglycerides 87  <960 mg/dL   HDL 63  >45 mg/dL   Total CHOL/HDL Ratio 2.1     VLDL 17  0 - 40 mg/dL   LDL Cholesterol 55  0 - 99 mg/dL  CBC     Status: Abnormal   Collection Time   06/07/12  5:28 AM      Component Value Range   WBC 5.7  4.0 - 10.5 K/uL   RBC 3.77 (*) 3.87 - 5.11 MIL/uL   Hemoglobin 12.0  12.0 - 15.0 g/dL   HCT 40.9  81.1 - 91.4 %   MCV 95.8  78.0 - 100.0 fL   MCH 31.8  26.0 - 34.0 pg   MCHC 33.2  30.0 - 36.0 g/dL   RDW 78.2  95.6 - 21.3 %   Platelets 198  150 - 400 K/uL  BASIC METABOLIC PANEL     Status: Abnormal   Collection Time   06/07/12  5:28 AM      Component Value Range   Sodium 140  135 - 145 mEq/L   Potassium 3.9  3.5 - 5.1 mEq/L   Chloride 108  96 - 112 mEq/L   CO2 23  19 - 32 mEq/L   Glucose, Bld 89  70 - 99 mg/dL   BUN 11  6 - 23 mg/dL   Creatinine, Ser 0.86  0.50 - 1.10 mg/dL  Calcium 8.5  8.4 - 10.5 mg/dL   GFR calc non Af Amer 79 (*) >90 mL/min   GFR calc Af Amer >90  >90 mL/min  HEPARIN LEVEL (UNFRACTIONATED)     Status: Normal   Collection Time   06/07/12  5:28 AM       Component Value Range   Heparin Unfractionated 0.57  0.30 - 0.70 IU/mL    Radiology/Studies: Dg Chest Port 1 View  06/05/2012  *RADIOLOGY REPORT*  Clinical Data: Chest tightness.  PORTABLE CHEST - 1 VIEW  Comparison: 01/07/2012.  Findings: Normal sized heart.  Stable linear scarring at the right lung base.  Otherwise, clear lungs.  Diffuse osteopenia and mild scoliosis.  IMPRESSION: No acute abnormality.   Original Report Authenticated By: Darrol Angel, M.D.     EKG:  SR  64 bpm  ASSESSMENT AND PLAN:   Patient is a 78 year old with known CAD.  PResents with 1 episode of chest pressure.  Relieved with NTG  Occurred at rest.  None since.  Patient vague but says it is somewhat different than angina in past. ON exam, no evidence of CHF.  EKG negative  She has r/o for MI I am not convinced current spell represents unstable angina.  She just started a PO abx for UTI on Sat.  Quesiton if related and more GI. Rec:  I would d/c heparin and IVF.  Ambulate.  If OK I would d/c with close outpatient f/u.   Patient notes no increase in intermitt chest pain spells at home.  Does note some fatigue which may be associated with UTI.  2.  HL  Very good control  3.  UTI  Rx   Signed, Dietrich Pates 06/07/2012, 9:23 AM

## 2012-06-07 NOTE — Progress Notes (Signed)
ANTICOAGULATION CONSULT NOTE   Pharmacy Consult for Heparin Indication: Unstable angina  Allergies  Allergen Reactions  . Ciprofloxacin   . Codeine     REACTION: shakiness  . Contrast Media (Iodinated Diagnostic Agents)   . Nsaids   . Nutritional Supplements   . Penicillins     REACTION: shakiness   Patient Measurements: Height: 5\' 4"  (162.6 cm) Weight: 147 lb 8 oz (66.906 kg) IBW/kg (Calculated) : 54.7  Heparin Dosing Weight: 66.9 kg  Vital Signs: Temp: 97.8 F (36.6 C) (10/07 0450) Temp src: Oral (10/07 0450) BP: 153/73 mmHg (10/07 0450) Pulse Rate: 59  (10/07 0450)  Labs:  Basename 06/07/12 0528 06/06/12 1946 06/06/12 1750 06/06/12 1021 06/06/12 0450 06/05/12 2221  HGB 12.0 -- -- -- 11.6* --  HCT 36.1 -- -- -- 35.5* 37.1  PLT 198 -- -- -- 195 215  APTT -- -- -- -- -- --  LABPROT -- -- -- -- -- --  INR -- -- -- -- -- --  HEPARINUNFRC 0.57 1.00* -- -- -- --  CREATININE 0.74 -- -- -- 0.71 0.76  CKTOTAL -- -- -- -- -- --  CKMB -- -- -- -- -- --  TROPONINI -- -- <0.30 <0.30 <0.30 --   Estimated Creatinine Clearance: 53.7 ml/min (by C-G formula based on Cr of 0.74).  Medical History: Past Medical History  Diagnosis Date  . Coronary atherosclerosis of native coronary artery     DES LAD and BMS LAD 8/08  . Mixed hyperlipidemia   . GERD (gastroesophageal reflux disease)   . Essential hypertension, benign   . Anxiety   . Resting tremor   . COPD (chronic obstructive pulmonary disease)   . Rickets   . MI (myocardial infarction) 2008   Medications:  Scheduled:     . atorvastatin  20 mg Oral QHS  . clopidogrel  75 mg Oral Daily  . heparin  3,000 Units Intravenous Once  . isosorbide mononitrate  30 mg Oral Daily  . lisinopril  10 mg Oral Daily  . pantoprazole  40 mg Oral Q1200  . sodium chloride  3 mL Intravenous Q12H  . sodium chloride      . sodium chloride      . DISCONTD: enoxaparin (LOVENOX) injection  30 mg Subcutaneous Q24H   Assessment: Heparin  level therapeutic  History of coronary artery disease Status post stent 2012 Unstable angina  Goal of Therapy:  Heparin level 0.3-0.7 units/ml Monitor platelets by anticoagulation protocol: Yes   Plan: continue Heparin at 550 units/hr Heparin level daily Labs per protocol Monitor platelets  Margo Aye, Bradie Sangiovanni A 06/07/2012,8:05 AM

## 2012-06-08 LAB — URINE CULTURE
Colony Count: NO GROWTH
Culture: NO GROWTH

## 2012-06-22 ENCOUNTER — Encounter (HOSPITAL_COMMUNITY): Payer: Self-pay | Admitting: *Deleted

## 2012-06-22 ENCOUNTER — Emergency Department (HOSPITAL_COMMUNITY): Payer: Medicare Other

## 2012-06-22 ENCOUNTER — Emergency Department (HOSPITAL_COMMUNITY)
Admission: EM | Admit: 2012-06-22 | Discharge: 2012-06-22 | Disposition: A | Discharge status: Home / Self Care | Payer: Medicare Other | Attending: Emergency Medicine | Admitting: Emergency Medicine

## 2012-06-22 DIAGNOSIS — J4489 Other specified chronic obstructive pulmonary disease: Secondary | ICD-10-CM | POA: Insufficient documentation

## 2012-06-22 DIAGNOSIS — J449 Chronic obstructive pulmonary disease, unspecified: Secondary | ICD-10-CM | POA: Insufficient documentation

## 2012-06-22 DIAGNOSIS — Z79899 Other long term (current) drug therapy: Secondary | ICD-10-CM | POA: Insufficient documentation

## 2012-06-22 DIAGNOSIS — E782 Mixed hyperlipidemia: Secondary | ICD-10-CM | POA: Insufficient documentation

## 2012-06-22 DIAGNOSIS — I252 Old myocardial infarction: Secondary | ICD-10-CM | POA: Insufficient documentation

## 2012-06-22 DIAGNOSIS — Z87891 Personal history of nicotine dependence: Secondary | ICD-10-CM | POA: Insufficient documentation

## 2012-06-22 DIAGNOSIS — I10 Essential (primary) hypertension: Secondary | ICD-10-CM | POA: Insufficient documentation

## 2012-06-22 DIAGNOSIS — Z7982 Long term (current) use of aspirin: Secondary | ICD-10-CM | POA: Insufficient documentation

## 2012-06-22 DIAGNOSIS — K219 Gastro-esophageal reflux disease without esophagitis: Secondary | ICD-10-CM | POA: Insufficient documentation

## 2012-06-22 DIAGNOSIS — I2510 Atherosclerotic heart disease of native coronary artery without angina pectoris: Secondary | ICD-10-CM | POA: Insufficient documentation

## 2012-06-22 DIAGNOSIS — Z7901 Long term (current) use of anticoagulants: Secondary | ICD-10-CM | POA: Insufficient documentation

## 2012-06-22 DIAGNOSIS — R531 Weakness: Secondary | ICD-10-CM

## 2012-06-22 DIAGNOSIS — R5381 Other malaise: Secondary | ICD-10-CM | POA: Insufficient documentation

## 2012-06-22 LAB — URINE MICROSCOPIC-ADD ON

## 2012-06-22 LAB — CBC WITH DIFFERENTIAL/PLATELET
Basophils Relative: 1 % (ref 0–1)
Eosinophils Absolute: 0.1 10*3/uL (ref 0.0–0.7)
Hemoglobin: 13.1 g/dL (ref 12.0–15.0)
MCH: 31.8 pg (ref 26.0–34.0)
MCHC: 33.6 g/dL (ref 30.0–36.0)
Monocytes Relative: 5 % (ref 3–12)
Neutrophils Relative %: 78 % — ABNORMAL HIGH (ref 43–77)

## 2012-06-22 LAB — URINALYSIS, ROUTINE W REFLEX MICROSCOPIC
Bilirubin Urine: NEGATIVE
Glucose, UA: NEGATIVE mg/dL
Ketones, ur: NEGATIVE mg/dL
Protein, ur: NEGATIVE mg/dL

## 2012-06-22 LAB — COMPREHENSIVE METABOLIC PANEL
Albumin: 3.8 g/dL (ref 3.5–5.2)
Alkaline Phosphatase: 60 U/L (ref 39–117)
BUN: 9 mg/dL (ref 6–23)
Creatinine, Ser: 0.78 mg/dL (ref 0.50–1.10)
Potassium: 4.3 mEq/L (ref 3.5–5.1)
Total Protein: 6.6 g/dL (ref 6.0–8.3)

## 2012-06-22 LAB — TROPONIN I: Troponin I: <0.3 ng/mL (ref <0.30)

## 2012-06-22 LAB — PROTIME-INR
INR: 0.96 (ref 0.00–1.49)
Prothrombin Time: 12.7 seconds (ref 11.6–15.2)

## 2012-06-22 NOTE — ED Notes (Signed)
Pt states she is unable to void at present 

## 2012-06-22 NOTE — ED Notes (Signed)
Pt reporting frequency and urgency, also reporting some burning with urination.  Reports symptoms began about 3 days ago.  Reporting generalized weakness as well.

## 2012-06-22 NOTE — ED Provider Notes (Signed)
History  This chart was scribed for Shelda Jakes, MD by Ladona Ridgel Day. This patient was seen in room APA12/APA12 and the patient's care was started at 0609.  CSN: [id number]  Arrival date & time 06/22/12  0609   First MD Initiated Contact with Patient 06/22/12 [phone]      Chief Complaint  Patient presents with  . Dysuria   Patient is a 78 y.o. female presenting with weakness. The history is provided by the patient. No language interpreter was used.  Weakness Primary symptoms do not include headaches, syncope, loss of consciousness, fever, nausea or vomiting. The symptoms began 3 to 5 days ago. The symptoms are unchanged. The neurological symptoms are diffuse. Context: no specific time or cause.  Additional symptoms include weakness.   [name] is a 78 y.o. female who presents to the Emergency Department complaining of dysuria and generalized weakness for the past 3 days. She states had episode of incontinence this AM but she did drink moderate amt of water before bed and maybe had a full bladder. She states that she felt fine yesterday and denies frequency for urgency. She denies abdominal pain, CP, SOB, HA, fever.   She was also admitted to hospital a couple weeks ago for unstable angina but denies any similar symptoms or any CP today.  Her PCP is Dr. Christell Constant  Past Medical History  Diagnosis Date  . Coronary atherosclerosis of native coronary artery     DES LAD and BMS LAD 8/08  . Mixed hyperlipidemia   . GERD (gastroesophageal reflux disease)   . Essential hypertension, benign   . Anxiety   . Resting tremor   . COPD (chronic obstructive pulmonary disease)   . Rickets   . MI (myocardial infarction) 2008    Past Surgical History  Procedure Date  . Appendectomy   . Cholecystectomy   . Bilateral cataract surgery   . Vein ligation and stripping   . Cardiac catheterization   . Coronary angioplasty with stent placement   . Left trochanteric bursa injection 11/27/11   DR. O'TOOLE  . Left sacroiliac joint injection 11/27/11    DR. O'TOOLE    Family History  Problem Relation Age of Onset  . Hypertension      History  Substance Use Topics  . Smoking status: Former Smoker -- 0.3 packs/day for 23 years    Types: Cigarettes    Quit date: 09/01/1973  . Smokeless tobacco: Never Used  . Alcohol Use: No    OB History    Grav Para Term Preterm Abortions TAB SAB Ect Mult Living   4 4 4       4       Review of Systems  Constitutional: Negative for fever and chills.  Respiratory: Negative for shortness of breath.   Cardiovascular: Negative for chest pain and syncope.  Gastrointestinal: Negative for nausea, vomiting and abdominal pain.  Genitourinary: Positive for dysuria.       Incontinence   Neurological: Positive for weakness. Negative for loss of consciousness and headaches.  All other systems reviewed and are negative.    Allergies  Ciprofloxacin; Codeine; Contrast media; Nsaids; Nutritional supplements; and Penicillins  Home Medications   Current Outpatient Rx  Name Route Sig Dispense Refill  . ASPIRIN 81 MG PO TABS Oral Take 81 mg by mouth daily.    Marland Kitchen LORAZEPAM 0.5 MG PO TABS Oral Take 0.5 mg by mouth every 8 (eight) hours.    . ACETAMINOPHEN 500 MG PO TABS Oral  Take 500 mg by mouth every 6 (six) hours as needed. pain    . ATORVASTATIN CALCIUM 20 MG PO TABS Oral Take 20 mg by mouth at bedtime.      . CLOPIDOGREL BISULFATE 75 MG PO TABS Oral Take 1 tablet (75 mg total) by mouth daily. 30 tablet 6    Generic GEX:BMWUXL    75MG  (TAB)  01/28/2012 9:54: ...  . ISOSORBIDE MONONITRATE ER 30 MG PO TB24 Oral Take 1 tablet (30 mg total) by mouth daily. 30 tablet 6  . LISINOPRIL 10 MG PO TABS Oral Take 1 tablet (10 mg total) by mouth daily. 30 tablet 6    Generic KGM:WNUUVOZ   10MG   . NITROGLYCERIN 0.4 MG SL SUBL Sublingual Place 0.4 mg under the tongue every 5 (five) minutes as needed.      Marland Kitchen PANTOPRAZOLE SODIUM 40 MG PO TBEC Oral Take 1 tablet  (40 mg total) by mouth daily at 12 noon. 30 tablet 2    Triage Vitals: BP 109/65  Pulse 91  Temp 98.1 F (36.7 C) (Oral)  Resp 14  Ht 5\' 4"  (1.626 m)  Wt 135 lb (61.236 kg)  BMI 23.17 kg/m2  SpO2 100%  Physical Exam  Nursing note and vitals reviewed. Constitutional: She is oriented to person, place, and time. She appears well-developed and well-nourished. No distress.  HENT:  Head: Normocephalic and atraumatic.  Eyes: EOM are normal.  Neck: Neck supple. No tracheal deviation present.  Cardiovascular: Normal rate, regular rhythm and normal heart sounds.   No murmur heard. Pulmonary/Chest: Effort normal and breath sounds normal. No respiratory distress. She has no wheezes. She has no rales.  Abdominal: Soft. Bowel sounds are normal. She exhibits no distension. There is no tenderness. There is no rebound and no guarding.  Musculoskeletal: Normal range of motion. She exhibits no edema and no tenderness.  Lymphadenopathy:    She has no cervical adenopathy.  Neurological: She is alert and oriented to person, place, and time. No cranial nerve deficit. Coordination normal.       Cranial nerves 2-12 intact,   Skin: Skin is warm and dry.  Psychiatric: She has a normal mood and affect. Her behavior is normal.    ED Course  Procedures (including critical care time) DIAGNOSTIC STUDIES: Oxygen Saturation is 98% on room air, normal by my interpretation.    COORDINATION OF CARE: At 725 AM Discussed treatment plan with patient which includes UA, blood work, CXR, cardiac marker. Patient agrees.   Labs Reviewed  URINALYSIS, ROUTINE W REFLEX MICROSCOPIC - Abnormal; Notable for the following:    Hgb urine dipstick TRACE (*)     Leukocytes, UA SMALL (*)     All other components within normal limits  CBC WITH DIFFERENTIAL - Abnormal; Notable for the following:    Neutrophils Relative 78 (*)     All other components within normal limits  COMPREHENSIVE METABOLIC PANEL - Abnormal; Notable for  the following:    Glucose, Bld 110 (*)     GFR calc non Af Amer 77 (*)     GFR calc Af Amer 90 (*)     All other components within normal limits  URINE MICROSCOPIC-ADD ON  TROPONIN I  PROTIME-INR  URINE CULTURE   Dg Chest 2 View  06/22/2012  *RADIOLOGY REPORT*  Clinical Data: Dysuria  CHEST - 2 VIEW  Comparison: Chest radiograph 06/05/2012  Findings: Normal cardiac silhouette.  Lungs are hyperinflated.  No effusion, infiltrate, or pneumothorax.  There is  a compression fracture of L2 vertebral body which is unchanged from comparison CT of 11/07/2011. Irregularity of the right lateral sixth rib is unchanged from comparison exams.  IMPRESSION:  1.  No acute cardiopulmonary findings. 2.  Hyperinflated lungs.  3.  Compression fracture of the L1 body is chronic.   Original Report Authenticated By: Genevive Bi, M.D.      Date: 06/22/2012  Rate: 58  Rhythm: sinus bradycardia  QRS Axis: normal  Intervals: normal  ST/T Wave abnormalities: normal  Conduction Disutrbances:none  Narrative Interpretation:   Old EKG Reviewed: unchanged From 06/06/12   Results for orders placed during the hospital encounter of 06/22/12  URINALYSIS, ROUTINE W REFLEX MICROSCOPIC      Component Value Range   Color, Urine YELLOW  YELLOW   APPearance CLEAR  CLEAR   Specific Gravity, Urine 1.010  1.005 - 1.030   pH 6.0  5.0 - 8.0   Glucose, UA NEGATIVE  NEGATIVE mg/dL   Hgb urine dipstick TRACE (*) NEGATIVE   Bilirubin Urine NEGATIVE  NEGATIVE   Ketones, ur NEGATIVE  NEGATIVE mg/dL   Protein, ur NEGATIVE  NEGATIVE mg/dL   Urobilinogen, UA 0.2  0.0 - 1.0 mg/dL   Nitrite NEGATIVE  NEGATIVE   Leukocytes, UA SMALL (*) NEGATIVE  URINE MICROSCOPIC-ADD ON      Component Value Range   Squamous Epithelial / LPF RARE  RARE   WBC, UA 0-2  <3 WBC/hpf   RBC / HPF 0-2  <3 RBC/hpf  CBC WITH DIFFERENTIAL      Component Value Range   WBC 6.8  4.0 - 10.5 K/uL   RBC 4.12  3.87 - 5.11 MIL/uL   Hemoglobin 13.1  12.0 -  15.0 g/dL   HCT 16.1  09.6 - 04.5 %   MCV 94.7  78.0 - 100.0 fL   MCH 31.8  26.0 - 34.0 pg   MCHC 33.6  30.0 - 36.0 g/dL   RDW 40.9  81.1 - 91.4 %   Platelets 177  150 - 400 K/uL   Neutrophils Relative 78 (*) 43 - 77 %   Neutro Abs 5.3  1.7 - 7.7 K/uL   Lymphocytes Relative 15  12 - 46 %   Lymphs Abs 1.1  0.7 - 4.0 K/uL   Monocytes Relative 5  3 - 12 %   Monocytes Absolute 0.3  0.1 - 1.0 K/uL   Eosinophils Relative 1  0 - 5 %   Eosinophils Absolute 0.1  0.0 - 0.7 K/uL   Basophils Relative 1  0 - 1 %   Basophils Absolute 0.0  0.0 - 0.1 K/uL  COMPREHENSIVE METABOLIC PANEL      Component Value Range   Sodium 137  135 - 145 mEq/L   Potassium 4.3  3.5 - 5.1 mEq/L   Chloride 102  96 - 112 mEq/L   CO2 24  19 - 32 mEq/L   Glucose, Bld 110 (*) 70 - 99 mg/dL   BUN 9  6 - 23 mg/dL   Creatinine, Ser 7.82  0.50 - 1.10 mg/dL   Calcium 9.1  8.4 - 95.6 mg/dL   Total Protein 6.6  6.0 - 8.3 g/dL   Albumin 3.8  3.5 - 5.2 g/dL   AST 18  0 - 37 U/L   ALT 9  0 - 35 U/L   Alkaline Phosphatase 60  39 - 117 U/L   Total Bilirubin 0.8  0.3 - 1.2 mg/dL   GFR calc non Af  Amer 77 (*) >90 mL/min   GFR calc Af Amer 90 (*) >90 mL/min  TROPONIN I      Component Value Range   Troponin I <0.30  <0.30 ng/mL  PROTIME-INR      Component Value Range   Prothrombin Time 12.7  11.6 - 15.2 seconds   INR 0.96  0.00 - 1.49       1. Weakness       MDM  Workup in the emergency department negative for any specific cause of the weakness. Cardiac markers negative not consistent with a acute coronary event. EKG without arrhythmias or abnormalities. Basic labs without abnormalities. Patient states she is on Coumadin but her last several INRs have been normal range so and she does not have Coumadin listed as one of her medications but she is confused about that. Patient spinal signs are very stable patient is nontoxic no acute distress can be discharged home and followup with her primary care Dr. In the next few  days. In addition definitely no evidence of urinary tract infection.        I personally performed the services described in this documentation, which was scribed in my presence. The recorded information has been reviewed and considered.           Shelda Jakes, MD 06/22/12 [phone]

## 2012-06-23 LAB — URINE CULTURE
Colony Count: NO GROWTH
Culture: NO GROWTH

## 2012-07-14 ENCOUNTER — Other Ambulatory Visit: Payer: Self-pay | Admitting: Cardiology

## 2012-07-26 ENCOUNTER — Ambulatory Visit: Payer: Medicare Other | Admitting: Cardiology

## 2012-08-23 ENCOUNTER — Encounter (HOSPITAL_COMMUNITY): Payer: Self-pay | Admitting: Pharmacy Technician

## 2012-09-06 ENCOUNTER — Encounter (HOSPITAL_COMMUNITY): Payer: Self-pay | Admitting: *Deleted

## 2012-09-06 ENCOUNTER — Ambulatory Visit (HOSPITAL_COMMUNITY)
Admission: RE | Admit: 2012-09-06 | Discharge: 2012-09-06 | Disposition: A | Discharge status: Home / Self Care | Payer: Medicare Other | Source: Ambulatory Visit | Attending: Ophthalmology | Admitting: Ophthalmology

## 2012-09-06 ENCOUNTER — Encounter (HOSPITAL_COMMUNITY)
Admission: RE | Disposition: A | Discharge status: Home / Self Care | Payer: Self-pay | Source: Ambulatory Visit | Attending: Ophthalmology

## 2012-09-06 DIAGNOSIS — J449 Chronic obstructive pulmonary disease, unspecified: Secondary | ICD-10-CM | POA: Insufficient documentation

## 2012-09-06 DIAGNOSIS — H26499 Other secondary cataract, unspecified eye: Secondary | ICD-10-CM | POA: Insufficient documentation

## 2012-09-06 DIAGNOSIS — I10 Essential (primary) hypertension: Secondary | ICD-10-CM | POA: Insufficient documentation

## 2012-09-06 DIAGNOSIS — J4489 Other specified chronic obstructive pulmonary disease: Secondary | ICD-10-CM | POA: Insufficient documentation

## 2012-09-06 HISTORY — PX: YAG LASER APPLICATION: SHX6189

## 2012-09-06 SURGERY — TREATMENT, USING YAG LASER
Anesthesia: LOCAL | Laterality: Left

## 2012-09-06 SURGERY — TREATMENT, USING YAG LASER
Anesthesia: General | Laterality: Left

## 2012-09-06 MED ORDER — CYCLOPENTOLATE-PHENYLEPHRINE 0.2-1 % OP SOLN
1.0000 [drp] | OPHTHALMIC | Status: AC
  Administered 2012-09-06 (×3): 1 [drp] via OPHTHALMIC

## 2012-09-06 MED ORDER — TETRACAINE HCL 0.5 % OP SOLN
1.0000 [drp] | OPHTHALMIC | Status: AC
  Administered 2012-09-06 (×2): 1 [drp] via OPHTHALMIC

## 2012-09-06 MED ORDER — APRACLONIDINE HCL 1 % OP SOLN
OPHTHALMIC | Status: AC
  Filled 2012-09-06: qty 0.1

## 2012-09-06 MED ORDER — APRACLONIDINE HCL 1 % OP SOLN
1.0000 [drp] | OPHTHALMIC | Status: AC
  Administered 2012-09-06 (×3): 1 [drp] via OPHTHALMIC

## 2012-09-06 MED ORDER — CYCLOPENTOLATE-PHENYLEPHRINE 0.2-1 % OP SOLN
OPHTHALMIC | Status: AC
  Filled 2012-09-06: qty 2

## 2012-09-06 MED ORDER — TETRACAINE HCL 0.5 % OP SOLN
OPHTHALMIC | Status: AC
  Filled 2012-09-06: qty 2

## 2012-09-06 NOTE — H&P (Signed)
I have reviewed the pre printed H&P, the patient was re-examined, and I have identified no significant interval changes in the patient's medical condition.  There is no change in the plan of care since the history and physical of record. 

## 2012-09-06 NOTE — Procedures (Signed)
RUBYANN LINGLE 09/06/2012  Susa Simmonds, MD  Yag Laser Self Test Completedyes. Procedure: Posterior Capsulotomy, left eye.  Eye Protection Worn by Staff yes. Laser In Use Sign on Door yes.  Laser: Nd:YAG Spot Size: Fixed Burst Mode: III Power Setting: 1.8 mJ/burst Position treated:  Posterior capsule Number of shots: 27 Total energy delivered: 48.2 mJ  The patient tolerated the procedure without difficulty. No complications were encountered.  Tenometer reading immediately after procedure: 9 mmHg.  Patient verbalizes understanding of discharge instructions yes.   Notes:partially thickened posterior capsule noted

## 2012-09-06 NOTE — Op Note (Signed)
This was a procedure, no op note is required.  A procedure note was done and signed

## 2012-09-07 ENCOUNTER — Encounter (HOSPITAL_COMMUNITY): Payer: Self-pay | Admitting: Ophthalmology

## 2012-09-10 ENCOUNTER — Ambulatory Visit (INDEPENDENT_AMBULATORY_CARE_PROVIDER_SITE_OTHER): Payer: Medicare Other | Admitting: Cardiology

## 2012-09-10 ENCOUNTER — Encounter: Payer: Self-pay | Admitting: Cardiology

## 2012-09-10 VITALS — BP 123/71 | HR 69 | Ht 60.0 in | Wt 139.0 lb

## 2012-09-10 DIAGNOSIS — I10 Essential (primary) hypertension: Secondary | ICD-10-CM

## 2012-09-10 DIAGNOSIS — K219 Gastro-esophageal reflux disease without esophagitis: Secondary | ICD-10-CM

## 2012-09-10 DIAGNOSIS — E785 Hyperlipidemia, unspecified: Secondary | ICD-10-CM

## 2012-09-10 DIAGNOSIS — I2510 Atherosclerotic heart disease of native coronary artery without angina pectoris: Secondary | ICD-10-CM

## 2012-09-10 MED ORDER — NITROGLYCERIN 0.4 MG SL SUBL: 0.4000 mg | SUBLINGUAL_TABLET | SUBLINGUAL | Status: DC | PRN

## 2012-09-10 MED ORDER — PANTOPRAZOLE SODIUM 40 MG PO TBEC: 40.0000 mg | DELAYED_RELEASE_TABLET | Freq: Every day | ORAL | Status: DC

## 2012-09-10 NOTE — Assessment & Plan Note (Signed)
Continue statin therapy.

## 2012-09-10 NOTE — Assessment & Plan Note (Signed)
No change in current blood pressure control regimen.

## 2012-09-10 NOTE — Progress Notes (Signed)
Clinical Summary [name] is a 79 y.o.female presenting for followup. She was seen in May 2013. Interval history reviewed including hospitalization at [facility] in October 2013 with chest pain/angina symptoms. She ruled out for myocardial infarction and was treated medically. Last cardiac catheterization in October 2012 demonstrated patent stent sites in the LAD as well as a 70% stenosis in a small first diagonal branch less than 1.5 mm that was managed medically. Interestingly, she states that she has done much better on protonix.  She reports no recurrent chest pain symptoms. Denies any bleeding problems on DAPT. Just recently underwent eye surgery.   Allergies  Allergen Reactions  . Ciprofloxacin   . Codeine     REACTION: shakiness  . Contrast Media (Iodinated Diagnostic Agents)   . Nsaids   . Nutritional Supplements   . Penicillins     REACTION: shakiness    Current Outpatient Prescriptions  Medication Sig Dispense Refill  . acetaminophen (TYLENOL) 500 MG tablet Take 500 mg by mouth every 6 (six) hours as needed. pain      . aspirin 81 MG tablet Take 81 mg by mouth daily.      Marland Kitchen atorvastatin (LIPITOR) 20 MG tablet Take 20 mg by mouth at bedtime.        . clopidogrel (PLAVIX) 75 MG tablet Take 1 tablet (75 mg total) by mouth daily.  30 tablet  6  . isosorbide mononitrate (IMDUR) 30 MG 24 hr tablet TAKE 1 TABLET BY MOUTH EVERY DAY  30 tablet  6  . lisinopril (PRINIVIL,ZESTRIL) 10 MG tablet Take 1 tablet (10 mg total) by mouth daily.  30 tablet  6  . LORazepam (ATIVAN) 0.5 MG tablet Take 0.5 mg by mouth every 8 (eight) hours.      . nitroGLYCERIN (NITROSTAT) 0.4 MG SL tablet Place 1 tablet (0.4 mg total) under the tongue every 5 (five) minutes as needed.  25 tablet  3  . pantoprazole (PROTONIX) 40 MG tablet Take 1 tablet (40 mg total) by mouth daily at 12 noon.  30 tablet  6    Past Medical History  Diagnosis Date  . Coronary atherosclerosis of native coronary artery     DES  LAD and BMS LAD 8/08  . Mixed hyperlipidemia   . GERD (gastroesophageal reflux disease)   . Essential hypertension, benign   . Anxiety   . Resting tremor   . COPD (chronic obstructive pulmonary disease)   . Rickets   . MI (myocardial infarction) 2008    Social History Ms. Miltenberger reports that she quit smoking about 39 years ago. Her smoking use included Cigarettes. She has a 6.9 pack-year smoking history. She has never used smokeless tobacco. Ms. Renk reports that she does not drink alcohol.  Review of Systems Negative except as outlined above.  Physical Examination Filed Vitals:   09/10/12 1446  BP: 123/71  Pulse: 69   Filed Weights   09/10/12 1446  Weight: 139 lb (63.05 kg)    HEENT: Conjunctiva and lids normal, oropharynx clear.  Neck: Supple, no elevated jugular venous pressure, no bruits.  Lungs: Clear to auscultation, nonlabored.  Cardiac: Regular rate and rhythm, no S3 gallop.  Abdomen: Soft, nontender, no bruits.  Skin: Warm and dry.  Extremities: No significant peripheral edema  Musculoskeletal: Mild kyphosis.  Neuropsychiatric: Alert and oriented x3, resting tremor.   Problem List and Plan   CORONARY ATHEROSCLEROSIS NATIVE CORONARY ARTERY Symptomatically stable on medical therapy. Last catheterization in October 2012 demonstrated patent  LAD stent sites and a medically managed diagonal lesion, relatively small vessel. No change in current regimen. Refill given for nitroglycerin.  Essential hypertension, benign No change in current blood pressure control regimen.  GERD She is out of protonix, refill given.  HYPERLIPIDEMIA Continue statin therapy.    Jonelle Sidle, M.D., F.A.C.C.

## 2012-09-10 NOTE — Assessment & Plan Note (Signed)
She is out of protonix, refill given.

## 2012-09-10 NOTE — Assessment & Plan Note (Signed)
Symptomatically stable on medical therapy. Last catheterization in October 2012 demonstrated patent LAD stent sites and a medically managed diagonal lesion, relatively small vessel. No change in current regimen. Refill given for nitroglycerin.

## 2012-09-10 NOTE — Patient Instructions (Addendum)

## 2012-10-29 ENCOUNTER — Other Ambulatory Visit: Payer: Self-pay | Admitting: Cardiology

## 2012-11-16 ENCOUNTER — Other Ambulatory Visit: Payer: Self-pay | Admitting: *Deleted

## 2012-11-16 ENCOUNTER — Telehealth: Payer: Self-pay | Admitting: Family Medicine

## 2012-11-16 DIAGNOSIS — Z780 Asymptomatic menopausal state: Secondary | ICD-10-CM

## 2012-11-16 NOTE — Telephone Encounter (Signed)
PLEASE CALL ABOUT BLADDER Spoke with patient --not taking antibiotics because no more symptoms but c/o frequency.pt using otc azo -standard.  Call [facility] if new meds

## 2012-11-16 NOTE — Telephone Encounter (Signed)
Please call has a question about what talked about this morning

## 2012-11-16 NOTE — Telephone Encounter (Signed)
PLEASE CALL ABOUT BLADDER.

## 2012-11-16 NOTE — Telephone Encounter (Signed)
Per dr Modesto Charon stop the azo standard and come in 1 week for repeat urine. Pt verbalized understanding

## 2012-11-26 ENCOUNTER — Telehealth: Payer: Self-pay | Admitting: Family Medicine

## 2012-11-26 NOTE — Telephone Encounter (Signed)
Pt already has appt on mon- will address then per son

## 2012-11-29 ENCOUNTER — Encounter: Payer: Self-pay | Admitting: Family Medicine

## 2012-11-29 ENCOUNTER — Ambulatory Visit (INDEPENDENT_AMBULATORY_CARE_PROVIDER_SITE_OTHER): Payer: Medicare Other | Admitting: Family Medicine

## 2012-11-29 VITALS — BP 119/54 | HR 60 | Temp 97.6°F | Ht 60.0 in | Wt 146.8 lb

## 2012-11-29 DIAGNOSIS — N8111 Cystocele, midline: Secondary | ICD-10-CM

## 2012-11-29 DIAGNOSIS — IMO0002 Reserved for concepts with insufficient information to code with codable children: Secondary | ICD-10-CM

## 2012-11-29 DIAGNOSIS — R32 Unspecified urinary incontinence: Secondary | ICD-10-CM

## 2012-11-29 DIAGNOSIS — E785 Hyperlipidemia, unspecified: Secondary | ICD-10-CM

## 2012-11-29 DIAGNOSIS — R300 Dysuria: Secondary | ICD-10-CM

## 2012-11-29 DIAGNOSIS — I10 Essential (primary) hypertension: Secondary | ICD-10-CM

## 2012-11-29 DIAGNOSIS — D72829 Elevated white blood cell count, unspecified: Secondary | ICD-10-CM

## 2012-11-29 DIAGNOSIS — I2510 Atherosclerotic heart disease of native coronary artery without angina pectoris: Secondary | ICD-10-CM

## 2012-11-29 LAB — POCT CBC
Granulocyte percent: 67.3 %G (ref 37–80)
HCT, POC: 33.4 % — AB (ref 37.7–47.9)
Hemoglobin: 12.1 g/dL — AB (ref 12.2–16.2)
Lymph, poc: 1.7 (ref 0.6–3.4)
MCH, POC: 33.3 pg — AB (ref 27–31.2)
MCHC: 36.3 g/dL — AB (ref 31.8–35.4)
MCV: 91.7 fL (ref 80–97)
MPV: 6.9 fL (ref 0–99.8)
POC Granulocyte: 67.3 — AB (ref 2–6.9)
POC LYMPH PERCENT: 28.1 %L (ref 10–50)
Platelet Count, POC: 222 10*3/uL (ref 142–424)
RBC: 3.6 M/uL — AB (ref 4.04–5.48)
RDW, POC: 14.2 %
WBC: 5.9 10*3/uL (ref 4.6–10.2)

## 2012-11-29 LAB — POCT UA - MICROSCOPIC ONLY
Bacteria, U Microscopic: NEGATIVE
Casts, Ur, LPF, POC: NEGATIVE
Crystals, Ur, HPF, POC: NEGATIVE
Mucus, UA: NEGATIVE
Yeast, UA: NEGATIVE

## 2012-11-29 NOTE — Progress Notes (Signed)
Patient ID: [name], female   DOB: 12-07-1930, 79 y.o.   MRN: [id number] SUBJECTIVE:   HPI: Patient has had persistent symptoms of burning with urination and persistent frequency and incontinence. She has to wear pads. Has been treated for UTI frequently for UTI but has problem really of incontinence and  Difficulty holding her urine. She was recently treated at [facility] for a drug reaction to her antibiotics. She was told to drinl\k a lot of fluids but that just makes her urinate more.  PMH/PSH:reviewed/updated in Epic SH/FH:reviewed/updated in Epic Meds:reviewed/updated in Epic Allergies:reviewed/updated in Epic Immunizations:reviewed/updated in Epic  ROS: as above in HPI. Other systems are negative or stable today.  OBJECTIVE:   Patient in no acute distress.elderly. Mild kyphosis. BP 119/54  Pulse 60  Temp(Src) 97.6 F (36.4 C) (Oral)  Ht 5' (1.524 m)  Wt 146 lb 12.8 oz (66.588 kg)  BMI 28.67 kg/m2  VS: SKIN: warm and  Dry without overt rashes. HEAD and Neck: without JVD, Normal LUNGS: Clear CVS: Regular rhythm, normal sounds, and absence of murmurs, rubs or gallops. ABDOMEN: Benign,, no organomegaly, no masses, no Abdominal Aortic enlargement. EXTREMETIES: nonedematous. NEUROLOGIC: oriented to time,place and person; nonfocal GYN: External.: Atrophic. Vagina: atrophic No intrapelvic organs felt. Mild cystocele. Stress incontinence with a strong cough.  ASSESSMENT:  Essential hypertension, benign Stable. Saw cardiologist. Doing well no symptoms. Plan no change in meds.  CORONARY ATHEROSCLEROSIS NATIVE CORONARY ARTERY Followed by Dr Diona Browner. Seen recently and is doing very well.  Dysuria Suspect due to atrophic vaginitis and a small cystocele on exam. And worsened by urinary incontinence. Will check another UA and UCx.  Urinary incontinence Will refer to Urology for evaluation   Leokocytosis: ? etiology  PLAN:  Handout on Urinary  incontinence in AVS. Discussed with patient and son that her main prolem is due to Atrophic vaginitis , menopause and a small cystocele Orders Placed This Encounter  Procedures  . Urine culture  . Ambulatory referral to Urology    Referral Priority:  Routine    Referral Type:  Consultation    Referral Reason:  Specialty Services Required    Requested Specialty:  Urology    Number of Visits Requested:  1  . POCT UA - Microscopic Only  . POCT CBC   Results for orders placed in visit on 11/29/12 (from the past 24 hour(s))  POCT UA - MICROSCOPIC ONLY     Status: Abnormal   Collection Time    11/29/12  9:31 AM      Result Value Range   WBC, Ur, HPF, POC 10-15     RBC, urine, microscopic 10-15     Bacteria, U Microscopic neg     Mucus, UA neg     Epithelial cells, urine per micros occ     Crystals, Ur, HPF, POC neg     Casts, Ur, LPF, POC neg     Yeast, UA neg    POCT CBC     Status: Abnormal   Collection Time    11/29/12  9:31 AM      Result Value Range   WBC 5.9  4.6 - 10.2 K/uL   Lymph, poc 1.7  0.6 - 3.4   POC LYMPH PERCENT 28.1  10 - 50 %L   MID (cbc)    0 - 0.9   POC MID %    0 - 12 %M   POC Granulocyte 67.3 (*) 2 - 6.9   Granulocyte percent 67.3  37 - 80 %G   RBC 3.6 (*) 4.04 - 5.48 M/uL   Hemoglobin 12.1 (*) 12.2 - 16.2 g/dL   HCT, POC 78.2 (*) 95.6 - 47.9 %   MCV 91.7  80 - 97 fL   MCH, POC 33.3 (*) 27 - 31.2 pg   MCHC 36.3 (*) 31.8 - 35.4 g/dL   RDW, POC 21.3     Platelet Count, POC 222.0  142 - 424 K/uL   MPV 6.9  0 - 99.8 fL    Faaris Arizpe P. Modesto Charon, M.D.

## 2012-11-29 NOTE — Assessment & Plan Note (Signed)
Followed by Dr Diona Browner. Seen recently and is doing very well.

## 2012-11-29 NOTE — Assessment & Plan Note (Signed)
Suspect due to atrophic vaginitis and a small cystocele on exam. And worsened by urinary incontinence. Will check another UA and UCx.

## 2012-11-29 NOTE — Assessment & Plan Note (Signed)
Stable. Saw cardiologist. Doing well no symptoms. Plan no change in meds.

## 2012-11-29 NOTE — Assessment & Plan Note (Signed)
Will refer to Urology for evaluation

## 2012-11-29 NOTE — Patient Instructions (Signed)
Urinary Incontinence Your doctor wants you to have this information about urinary incontinence. This is the inability to keep urine in your body until you decide to release it. CAUSES  Prostate gland enlargement is a common cause of urinary incontinence. But there are many different causes for losing urinary control. They include:  Medicines.  Infections.  Prostate problems.  Surgery.  Neurological diseases.  Emotional factors. DIAGNOSIS  Evaluating the cause of incontinence is important in choosing the best treatment. This may require:  An ultrasound exam.  Kidney and bladder X-rays.  Cystoscopy. This is an exam of the bladder using a narrow scope. TREATMENT  For incontinent patients, normal daily hygiene and using changing pads or adult diapers regularly will prevent offensive odors and skin damage from the moisture. Changing your medicines may help control incontinence. Your caregiver may prescribe some medicines to help you regain control. Avoid caffeine. It can over-stimulate the bladder. Use the bathroom regularly. Try about every 2 to 3 hours even if you do not feel the need. Take time to empty your bladder completely. After urinating, wait a minute. Then try to urinate again. External devices used to catch urine or an indwelling urine catheter (Foley catheter) may be needed as well. Some prostate gland problems require surgery to correct. Call your caregiver for more information. Document Released: 09/25/2004 Document Revised: 11/10/2011 Document Reviewed: 09/20/2008 Exit[employer] Patient Information 2013 [employer].  

## 2012-11-30 LAB — URINE CULTURE
Colony Count: NO GROWTH
Organism ID, Bacteria: NO GROWTH

## 2012-12-01 NOTE — Progress Notes (Signed)
Quick Note:  Call patient. Labs normal.no infection in urine. CBC is okay. No change in plan.referral to urology for urinary incontinence. ______

## 2012-12-06 ENCOUNTER — Telehealth: Payer: Self-pay | Admitting: Family Medicine

## 2012-12-06 NOTE — Telephone Encounter (Signed)
notiifed pt and appt times given to her   no further questions

## 2012-12-06 NOTE — Telephone Encounter (Signed)
Spoke with pt wanted results of urinalysis and wants to know if referral been sch states been since march 31

## 2012-12-27 ENCOUNTER — Telehealth: Payer: Self-pay | Admitting: Family Medicine

## 2012-12-27 NOTE — Telephone Encounter (Signed)
Pt was in hospital last week and is very weak today and son wants her to be seen today by Modesto Charon

## 2012-12-27 NOTE — Telephone Encounter (Signed)
Spoke with son thinks may have uti --appt 12-28-12   At 9;40

## 2012-12-28 ENCOUNTER — Ambulatory Visit (INDEPENDENT_AMBULATORY_CARE_PROVIDER_SITE_OTHER): Payer: Medicare Other | Admitting: Family Medicine

## 2012-12-28 ENCOUNTER — Encounter: Payer: Self-pay | Admitting: Family Medicine

## 2012-12-28 VITALS — BP 131/68 | HR 66 | Temp 97.1°F | Ht 62.0 in | Wt 147.8 lb

## 2012-12-28 DIAGNOSIS — I2510 Atherosclerotic heart disease of native coronary artery without angina pectoris: Secondary | ICD-10-CM

## 2012-12-28 DIAGNOSIS — R300 Dysuria: Secondary | ICD-10-CM | POA: Insufficient documentation

## 2012-12-28 DIAGNOSIS — E785 Hyperlipidemia, unspecified: Secondary | ICD-10-CM

## 2012-12-28 DIAGNOSIS — IMO0002 Reserved for concepts with insufficient information to code with codable children: Secondary | ICD-10-CM

## 2012-12-28 DIAGNOSIS — N8111 Cystocele, midline: Secondary | ICD-10-CM

## 2012-12-28 DIAGNOSIS — I10 Essential (primary) hypertension: Secondary | ICD-10-CM

## 2012-12-28 DIAGNOSIS — R32 Unspecified urinary incontinence: Secondary | ICD-10-CM

## 2012-12-28 LAB — POCT URINALYSIS DIPSTICK
Bilirubin, UA: NEGATIVE
Glucose, UA: NEGATIVE
Ketones, UA: NEGATIVE
Nitrite, UA: NEGATIVE
Protein, UA: NEGATIVE
Spec Grav, UA: 1.02
Urobilinogen, UA: NEGATIVE
pH, UA: 5

## 2012-12-28 LAB — POCT UA - MICROSCOPIC ONLY
Casts, Ur, LPF, POC: NEGATIVE
Crystals, Ur, HPF, POC: NEGATIVE
Yeast, UA: NEGATIVE

## 2012-12-28 MED ORDER — CEPHALEXIN 500 MG PO TABS: 500.0000 mg | ORAL_TABLET | Freq: Four times a day (QID) | ORAL | Status: DC

## 2012-12-28 NOTE — Progress Notes (Signed)
Patient ID: [name], female   DOB: 10-Jan-1931, 79 y.o.   MRN: [id number] SUBJECTIVE: HPI: Came with her son for follow up. Was hospitalized at [facility] because she wasn't feeling well and she was r/o MI. The problem she has is incontinence of urine and recurrent UTI. Has an appointment in early June with Dr Annabell Howells.for evaluation. Having urinary symptoms. Not having any other problems.   PMH/PSH: reviewed/updated in Epic  SH/FH: reviewed/updated in Epic  Allergies: reviewed/updated in Epic  Medications: reviewed/updated in Epic  Immunizations: reviewed/updated in Epic  ROS: As above in the HPI. All other systems are stable or negative.  OBJECTIVE: APPEARANCE:   Elderly White female.  Patient in no acute distress.The patient appeared well nourished and normally developed. Acyanotic. Waist: VITAL SIGNS:BP 131/68  Pulse 66  Temp(Src) 97.1 F (36.2 C) (Oral)  Ht 5\' 2"  (1.575 m)  Wt 147 lb 12.8 oz (67.042 kg)  BMI 27.03 kg/m2   SKIN: warm and  Dry without overt rashes, tattoos and scars  HEAD and Neck: without JVD, Head and scalp: normal Eyes:No scleral icterus. Fundi normal, eye movements normal. Ears: Auricle normal, canal normal, Tympanic membranes normal, insufflation normal. Nose: normal Throat: normal Neck & thyroid: normal  CHEST & LUNGS: Chest wall: normal Lungs: Clear  CVS: Reveals the PMI to be normally located. Regular rhythm, First and Second Heart sounds are normal,  absence of murmurs, rubs or gallops. Peripheral vasculature: Radial pulses: normal Dorsal pedis pulses: normal Posterior pulses: normal  ABDOMEN:  Appearance: normal Benign,, no organomegaly, no masses, no Abdominal Aortic enlargement. No Guarding , no rebound. No Bruits. Bowel sounds: normal    NEUROLOGIC: oriented to time,place and person; nonfocal.  ASSESSMENT: Burning with urination - Plan: POCT urinalysis dipstick, POCT UA - Microscopic Only, Urine  culture, Cephalexin 500 MG tablet  Cystocele  Essential hypertension, benign  CORONARY ATHEROSCLEROSIS NATIVE CORONARY ARTERY  HYPERLIPIDEMIA  Urinary incontinence  PLAN: Orders Placed This Encounter  Procedures  . Urine culture  . POCT urinalysis dipstick  . POCT UA - Microscopic Only   Meds ordered this encounter  Medications  . Vitamin D, Ergocalciferol, (DRISDOL) 50000 UNITS CAPS    Sig: Take 50,000 Units by mouth daily.  . Cephalexin 500 MG tablet    Sig: Take 1 tablet (500 mg total) by mouth 4 (four) times daily.    Dispense:  40 tablet    Refill:  0   incraese cranberry juice in diet .  Await Urology evaluation.  RTc 2 months. Await UCx. Consider suppressive therapy.   Ilithyia Titzer P. Modesto Charon, M.D.

## 2012-12-29 LAB — URINE CULTURE
Colony Count: NO GROWTH
Organism ID, Bacteria: NO GROWTH

## 2012-12-30 ENCOUNTER — Telehealth: Payer: Self-pay | Admitting: Family Medicine

## 2012-12-30 NOTE — Telephone Encounter (Signed)
Reduce antibiotic to twice daily with food If it still bothers her discontinue it and call us back

## 2012-12-30 NOTE — Telephone Encounter (Signed)
Spoke with son and verbalized understanding and instructed to call prn

## 2013-01-11 ENCOUNTER — Telehealth: Payer: Self-pay

## 2013-01-11 NOTE — Telephone Encounter (Signed)
Spoke to [name] and ;pt will need to wait the week before refilling the lorazepam . Too early for refill

## 2013-01-11 NOTE — Telephone Encounter (Signed)
[name] from [facility] called stating pt is requesting refill on lorazepam which is 9 days early. Please advise

## 2013-01-11 NOTE — Telephone Encounter (Signed)
Got to wait for 1 week for  Refill. Thanks.

## 2013-01-12 ENCOUNTER — Ambulatory Visit: Payer: Self-pay

## 2013-01-12 ENCOUNTER — Other Ambulatory Visit: Payer: Self-pay

## 2013-01-14 ENCOUNTER — Encounter: Payer: Self-pay | Admitting: Family Medicine

## 2013-01-14 ENCOUNTER — Ambulatory Visit (INDEPENDENT_AMBULATORY_CARE_PROVIDER_SITE_OTHER): Payer: Medicaid Other | Admitting: Family Medicine

## 2013-01-14 VITALS — BP 124/69 | HR 66 | Temp 98.0°F | Wt 146.0 lb

## 2013-01-14 DIAGNOSIS — R259 Unspecified abnormal involuntary movements: Secondary | ICD-10-CM

## 2013-01-14 DIAGNOSIS — IMO0002 Reserved for concepts with insufficient information to code with codable children: Secondary | ICD-10-CM

## 2013-01-14 DIAGNOSIS — R350 Frequency of micturition: Secondary | ICD-10-CM

## 2013-01-14 DIAGNOSIS — R251 Tremor, unspecified: Secondary | ICD-10-CM

## 2013-01-14 DIAGNOSIS — N8111 Cystocele, midline: Secondary | ICD-10-CM

## 2013-01-14 DIAGNOSIS — R32 Unspecified urinary incontinence: Secondary | ICD-10-CM

## 2013-01-14 DIAGNOSIS — R300 Dysuria: Secondary | ICD-10-CM

## 2013-01-14 LAB — POCT URINALYSIS DIPSTICK
Bilirubin, UA: NEGATIVE
Glucose, UA: NEGATIVE
Ketones, UA: NEGATIVE
Leukocytes, UA: NEGATIVE
Nitrite, UA: NEGATIVE
Spec Grav, UA: 1.01
Urobilinogen, UA: NEGATIVE
pH, UA: 5

## 2013-01-14 LAB — POCT UA - MICROSCOPIC ONLY
Casts, Ur, LPF, POC: NEGATIVE
Crystals, Ur, HPF, POC: NEGATIVE
Yeast, UA: NEGATIVE

## 2013-01-14 MED ORDER — ALPRAZOLAM 0.5 MG PO TBDP: 0.5000 mg | ORAL_TABLET | Freq: Every evening | ORAL | Status: DC | PRN

## 2013-01-14 MED ORDER — PHENAZOPYRIDINE HCL 100 MG PO TABS: 100.0000 mg | ORAL_TABLET | Freq: Three times a day (TID) | ORAL | Status: DC | PRN

## 2013-01-14 NOTE — Progress Notes (Signed)
Patient ID: [name], female   DOB: 1931-08-05, 79 y.o.   MRN: [id number] SUBJECTIVE: HPI: Follow up on Urinary symptoms.continue to have dysuria recurrently UCx has been negative. Has a cystocele and urinary incontinence. No fever, no back pain. Brought by son because she burns to urinate.  Had an appointment with Urology Dr Annabell Howells, but he had an Emergency and had to have her resceduled for July.   PMH/PSH: reviewed/updated in Epic  SH/FH: reviewed/updated in Epic  Allergies: reviewed/updated in Epic  Medications: reviewed/updated in Epic  Immunizations: reviewed/updated in Epic  ROS: As above in the HPI. All other systems are stable or negative.  OBJECTIVE: APPEARANCE:  Patient in no acute distress.The patient appeared well nourished and normally developed. Acyanotic. Waist: VITAL SIGNS:BP 124/69  Pulse 66  Temp(Src) 98 F (36.7 C) (Oral)  Wt 146 lb (66.225 kg)  BMI 26.7 kg/m2 Elderly WF   SKIN: warm and  Dry without overt rashes, tattoos and scars  HEAD and Neck: without JVD, Head and scalp: normal Eyes:No scleral icterus. Fundi normal, eye movements normal. Ears: Auricle normal, canal normal, Tympanic membranes normal, insufflation normal. Nose: normal Throat: normal Neck & thyroid: normal  CHEST & LUNGS: Chest wall: normal Lungs: Clear  CVS: Reveals the PMI to be normally located. Regular rhythm, First and Second Heart sounds are normal,  absence of murmurs, rubs or gallops. Peripheral vasculature: Radial pulses: normal Dorsal pedis pulses: normal Posterior pulses: normal  ABDOMEN:  Appearance:obese Benign,, no organomegaly, no masses, no Abdominal Aortic enlargement. No Guarding , no rebound. No Bruits. Bowel sounds: normal  RECTAL: N/A GU: N/A  EXTREMETIES: nonedematous.tremors: intentional.  ASSESSMENT: Urinary frequency - Plan: POCT urinalysis dipstick, POCT UA - Microscopic Only  Dysuria - Plan: Urine culture, phenazopyridine  (PYRIDIUM) 100 MG tablet  Cystocele - Plan: phenazopyridine (PYRIDIUM) 100 MG tablet  Urinary incontinence  Tremor - Plan: ALPRAZolam (NIRAVAM) 0.5 MG dissolvable tablet   PLAN:  Orders Placed This Encounter  Procedures  . Urine culture  . POCT urinalysis dipstick  . POCT UA - Microscopic Only   Meds ordered this encounter  Medications  . phenazopyridine (PYRIDIUM) 100 MG tablet    Sig: Take 1 tablet (100 mg total) by mouth 3 (three) times daily as needed for pain.    Dispense:  10 tablet    Refill:  0  . ALPRAZolam (NIRAVAM) 0.5 MG dissolvable tablet    Sig: Take 1 tablet (0.5 mg total) by mouth at bedtime as needed for anxiety.    Dispense:  20 tablet    Refill:  0   Discussed limited options and that we need to have patient evaluated by the Urologist.  RTc in  2 months as originally planned.  Kaiven Vester P. Modesto Charon, M.D.

## 2013-01-15 LAB — URINE CULTURE

## 2013-01-17 NOTE — Progress Notes (Signed)
Quick Note:  Labs abnormal.not a clean catch. Ask patient how is dysuria. Can she repeat urine and give A clean catch ? ______

## 2013-01-18 ENCOUNTER — Telehealth: Payer: Self-pay | Admitting: *Deleted

## 2013-01-18 NOTE — Telephone Encounter (Signed)
Patient states that she urinates much more at night and still having dysuria. Will try to get another specimen at home and drop it off here at office.

## 2013-01-24 ENCOUNTER — Other Ambulatory Visit: Payer: Self-pay | Admitting: Cardiology

## 2013-01-25 ENCOUNTER — Other Ambulatory Visit: Payer: Self-pay | Admitting: *Deleted

## 2013-01-25 MED ORDER — ISOSORBIDE MONONITRATE ER 30 MG PO TB24: 30.0000 mg | ORAL_TABLET | Freq: Every day | ORAL | Status: DC

## 2013-01-27 ENCOUNTER — Other Ambulatory Visit: Payer: Self-pay | Admitting: *Deleted

## 2013-01-27 MED ORDER — VITAMIN D (ERGOCALCIFEROL) 1.25 MG (50000 UNIT) PO CAPS: ORAL_CAPSULE | ORAL | Status: DC

## 2013-01-27 NOTE — Telephone Encounter (Signed)
LAST VIT D 3/12. VIT D 18.

## 2013-02-15 ENCOUNTER — Ambulatory Visit (INDEPENDENT_AMBULATORY_CARE_PROVIDER_SITE_OTHER): Payer: Medicare Other | Admitting: General Practice

## 2013-02-15 ENCOUNTER — Encounter: Payer: Self-pay | Admitting: General Practice

## 2013-02-15 ENCOUNTER — Ambulatory Visit: Payer: Self-pay | Admitting: Family Medicine

## 2013-02-15 VITALS — BP 139/64 | HR 66 | Temp 97.8°F | Ht 60.0 in | Wt 145.0 lb

## 2013-02-15 DIAGNOSIS — N23 Unspecified renal colic: Secondary | ICD-10-CM

## 2013-02-15 DIAGNOSIS — N390 Urinary tract infection, site not specified: Secondary | ICD-10-CM

## 2013-02-15 DIAGNOSIS — R309 Painful micturition, unspecified: Secondary | ICD-10-CM

## 2013-02-15 LAB — POCT UA - MICROSCOPIC ONLY: Casts, Ur, LPF, POC: NEGATIVE

## 2013-02-15 LAB — POCT URINALYSIS DIPSTICK
Bilirubin, UA: NEGATIVE
Glucose, UA: NEGATIVE
Ketones, UA: NEGATIVE
Nitrite, UA: NEGATIVE

## 2013-02-15 MED ORDER — AZITHROMYCIN 250 MG PO TABS: ORAL_TABLET | ORAL | Status: DC

## 2013-02-15 NOTE — Patient Instructions (Signed)
Urinary Tract Infection  Urinary tract infections (UTIs) can develop anywhere along your urinary tract. Your urinary tract is your body's drainage system for removing wastes and extra water. Your urinary tract includes two kidneys, two ureters, a bladder, and a urethra. Your kidneys are a pair of bean-shaped organs. Each kidney is about the size of your fist. They are located below your ribs, one on each side of your spine.  CAUSES  Infections are caused by microbes, which are microscopic organisms, including fungi, viruses, and bacteria. These organisms are so small that they can only be seen through a microscope. Bacteria are the microbes that most commonly cause UTIs.  SYMPTOMS   Symptoms of UTIs may vary by age and gender of the patient and by the location of the infection. Symptoms in young women typically include a frequent and intense urge to urinate and a painful, burning feeling in the bladder or urethra during urination. Older women and men are more likely to be tired, shaky, and weak and have muscle aches and abdominal pain. A fever may mean the infection is in your kidneys. Other symptoms of a kidney infection include pain in your back or sides below the ribs, nausea, and vomiting.  DIAGNOSIS  To diagnose a UTI, your caregiver will ask you about your symptoms. Your caregiver also will ask to provide a urine sample. The urine sample will be tested for bacteria and white blood cells. White blood cells are made by your body to help fight infection.  TREATMENT   Typically, UTIs can be treated with medication. Because most UTIs are caused by a bacterial infection, they usually can be treated with the use of antibiotics. The choice of antibiotic and length of treatment depend on your symptoms and the type of bacteria causing your infection.  HOME CARE INSTRUCTIONS   If you were prescribed antibiotics, take them exactly as your caregiver instructs you. Finish the medication even if you feel better after you  have only taken some of the medication.   Drink enough water and fluids to keep your urine clear or pale yellow.   Avoid caffeine, tea, and carbonated beverages. They tend to irritate your bladder.   Empty your bladder often. Avoid holding urine for long periods of time.   Empty your bladder before and after sexual intercourse.   After a bowel movement, women should cleanse from front to back. Use each tissue only once.  SEEK MEDICAL CARE IF:    You have back pain.   You develop a fever.   Your symptoms do not begin to resolve within 3 days.  SEEK IMMEDIATE MEDICAL CARE IF:    You have severe back pain or lower abdominal pain.   You develop chills.   You have nausea or vomiting.   You have continued burning or discomfort with urination.  MAKE SURE YOU:    Understand these instructions.   Will watch your condition.   Will get help right away if you are not doing well or get worse.  Document Released: 05/28/2005 Document Revised: 02/17/2012 Document Reviewed: 09/26/2011  Exit[employer] Patient Information 2014 [employer].

## 2013-02-15 NOTE — Progress Notes (Signed)
  Subjective:    Patient ID: [name], female    DOB: May 06, 1931, 79 y.o.   MRN: [id number]  HPI Patient presents today for complaints of a burning sensation. She was seen at [facility] (Sunday) for same symptoms and received keflex. Reports she is allergic to this medication and begins shaking. Reports CT scan was negative for kidney stones. Patient and son verbalized patient has multiple allergies (to antibiotics). Reports having an appointment with Dr. Annabell Howells on March 25, 2013.  Reports last bowel movement was two days ago and normal pattern is every couple days.     Review of Systems  Constitutional: Negative for fever and chills.  Respiratory: Negative for choking and shortness of breath.   Cardiovascular: Negative for chest pain and palpitations.  Gastrointestinal: Negative for abdominal pain and blood in stool.  Genitourinary: Positive for dysuria, urgency and frequency. Negative for hematuria and difficulty urinating.  Neurological: Negative for dizziness, weakness and headaches.       Objective:   Physical Exam  Constitutional: She is oriented to person, place, and time. She appears well-developed and well-nourished.  HENT:  Head: Normocephalic and atraumatic.  Cardiovascular: Normal rate, regular rhythm and normal heart sounds.   Pulmonary/Chest: Effort normal and breath sounds normal. No respiratory distress. She exhibits no tenderness.  Abdominal: Soft. Bowel sounds are normal. She exhibits no distension. There is tenderness in the suprapubic area. There is no CVA tenderness.  Neurological: She is alert and oriented to person, place, and time.  Skin: Skin is warm and dry.  Psychiatric: She has a normal mood and affect.   Results for orders placed in visit on 02/15/13  POCT URINALYSIS DIPSTICK      Result Value Range   Color, UA yellow     Clarity, UA clear     Glucose, UA neg     Bilirubin, UA neg     Ketones, UA neg     Spec Grav, UA 1.020     Blood,  UA mod     pH, UA 5.0     Protein, UA neg     Urobilinogen, UA negative     Nitrite, UA neg     Leukocytes, UA Trace    POCT UA - MICROSCOPIC ONLY      Result Value Range   WBC, Ur, HPF, POC 3-10     RBC, urine, microscopic 30-45     Bacteria, U Microscopic few     Mucus, UA mod     Epithelial cells, urine per micros mod     Crystals, Ur, HPF, POC neg     Casts, Ur, LPF, POC neg     Yeast, UA neg           Assessment & Plan:  1. Pain with urination - POCT urinalysis dipstick - POCT UA - Microscopic Only  2. UTI (urinary tract infection) - azithromycin (ZITHROMAX) 250 MG tablet; Take as directed  Dispense: 6 tablet; Refill: 0 -Increase fluid intake -Frequent voiding -Proper perineal hygiene -RTO if symptoms worsen and in one week -Culture pending -Patient and caregiver (sone) verbalized understanding -Coralie Keens, FNP-C

## 2013-02-24 ENCOUNTER — Other Ambulatory Visit: Payer: Self-pay

## 2013-02-24 MED ORDER — LORAZEPAM 0.5 MG PO TABS: 0.5000 mg | ORAL_TABLET | Freq: Every day | ORAL | Status: DC | PRN

## 2013-02-24 NOTE — Telephone Encounter (Signed)
Patient has been taking ativan 0.5mg  once daily, prn anxiety

## 2013-02-24 NOTE — Telephone Encounter (Signed)
Last seen 02/15/13   If approved call in and have nurse notify patient

## 2013-03-01 DIAGNOSIS — R079 Chest pain, unspecified: Secondary | ICD-10-CM

## 2013-03-04 ENCOUNTER — Encounter (HOSPITAL_COMMUNITY): Payer: Self-pay

## 2013-03-04 ENCOUNTER — Emergency Department (HOSPITAL_COMMUNITY)
Admission: EM | Admit: 2013-03-04 | Discharge: 2013-03-04 | Disposition: A | Discharge status: Home / Self Care | Payer: Medicare Other | Attending: Emergency Medicine | Admitting: Emergency Medicine

## 2013-03-04 DIAGNOSIS — J449 Chronic obstructive pulmonary disease, unspecified: Secondary | ICD-10-CM | POA: Insufficient documentation

## 2013-03-04 DIAGNOSIS — Z87891 Personal history of nicotine dependence: Secondary | ICD-10-CM | POA: Insufficient documentation

## 2013-03-04 DIAGNOSIS — I252 Old myocardial infarction: Secondary | ICD-10-CM | POA: Insufficient documentation

## 2013-03-04 DIAGNOSIS — J4489 Other specified chronic obstructive pulmonary disease: Secondary | ICD-10-CM | POA: Insufficient documentation

## 2013-03-04 DIAGNOSIS — E782 Mixed hyperlipidemia: Secondary | ICD-10-CM | POA: Insufficient documentation

## 2013-03-04 DIAGNOSIS — Z79899 Other long term (current) drug therapy: Secondary | ICD-10-CM | POA: Insufficient documentation

## 2013-03-04 DIAGNOSIS — Z9089 Acquired absence of other organs: Secondary | ICD-10-CM | POA: Insufficient documentation

## 2013-03-04 DIAGNOSIS — Z8669 Personal history of other diseases of the nervous system and sense organs: Secondary | ICD-10-CM | POA: Insufficient documentation

## 2013-03-04 DIAGNOSIS — R300 Dysuria: Secondary | ICD-10-CM | POA: Insufficient documentation

## 2013-03-04 DIAGNOSIS — K219 Gastro-esophageal reflux disease without esophagitis: Secondary | ICD-10-CM | POA: Insufficient documentation

## 2013-03-04 DIAGNOSIS — I10 Essential (primary) hypertension: Secondary | ICD-10-CM | POA: Insufficient documentation

## 2013-03-04 DIAGNOSIS — Z91040 Latex allergy status: Secondary | ICD-10-CM | POA: Insufficient documentation

## 2013-03-04 DIAGNOSIS — Z7982 Long term (current) use of aspirin: Secondary | ICD-10-CM | POA: Insufficient documentation

## 2013-03-04 DIAGNOSIS — F411 Generalized anxiety disorder: Secondary | ICD-10-CM | POA: Insufficient documentation

## 2013-03-04 DIAGNOSIS — Z880 Allergy status to penicillin: Secondary | ICD-10-CM | POA: Insufficient documentation

## 2013-03-04 DIAGNOSIS — I2510 Atherosclerotic heart disease of native coronary artery without angina pectoris: Secondary | ICD-10-CM | POA: Insufficient documentation

## 2013-03-04 DIAGNOSIS — Z9861 Coronary angioplasty status: Secondary | ICD-10-CM | POA: Insufficient documentation

## 2013-03-04 DIAGNOSIS — Z9889 Other specified postprocedural states: Secondary | ICD-10-CM | POA: Insufficient documentation

## 2013-03-04 LAB — URINALYSIS, ROUTINE W REFLEX MICROSCOPIC
Ketones, ur: NEGATIVE mg/dL
Leukocytes, UA: NEGATIVE
Nitrite: NEGATIVE
Protein, ur: NEGATIVE mg/dL
Urobilinogen, UA: 0.2 mg/dL (ref 0.0–1.0)
pH: 5.5 (ref 5.0–8.0)

## 2013-03-04 MED ORDER — PHENAZOPYRIDINE HCL 200 MG PO TABS: 200.0000 mg | ORAL_TABLET | Freq: Three times a day (TID) | ORAL | Status: DC | PRN

## 2013-03-04 NOTE — ED Notes (Signed)
Patient with no complaints at this time. Respirations even and unlabored. Skin warm/dry. Discharge instructions reviewed with patient at this time. Patient given opportunity to voice concerns/ask questions. Patient discharged at this time and left Emergency Department with steady gait.   

## 2013-03-04 NOTE — ED Provider Notes (Signed)
History    CSN: [id number] Arrival date & time 03/04/13  1726  First MD Initiated Contact with Patient 03/04/13 1735     Chief Complaint  Patient presents with  . Urinary Tract Infection   (Consider location/radiation/quality/duration/timing/severity/associated sxs/prior Treatment) Patient is a 79 y.o. female presenting with urinary tract infection. The history is provided by the patient (pt complains of burning with urination).  Urinary Tract Infection The current episode started 12 to 24 hours ago. The problem occurs constantly. The problem has not changed since onset.Pertinent negatives include no chest pain, no abdominal pain and no headaches. Nothing aggravates the symptoms. Nothing relieves the symptoms.   Past Medical History  Diagnosis Date  . Coronary atherosclerosis of native coronary artery     DES LAD and BMS LAD 8/08  . Mixed hyperlipidemia   . GERD (gastroesophageal reflux disease)   . Essential hypertension, benign   . Anxiety   . Resting tremor   . COPD (chronic obstructive pulmonary disease)   . Rickets   . MI (myocardial infarction) 2008   Past Surgical History  Procedure Laterality Date  . Appendectomy    . Cholecystectomy    . Bilateral cataract surgery    . Vein ligation and stripping    . Cardiac catheterization    . Coronary angioplasty with stent placement    . Left trochanteric bursa injection  11/27/11    DR. O'TOOLE  . Left sacroiliac joint injection  11/27/11    DR. O'TOOLE  . Yag laser application  09/06/2012    Procedure: YAG LASER APPLICATION;  Surgeon: Susa Simmonds, MD;  Location: [facility];  Service: Ophthalmology;  Laterality: Left;   Family History  Problem Relation Age of Onset  . Hypertension     History  Substance Use Topics  . Smoking status: Former Smoker -- 0.30 packs/day for 23 years    Types: Cigarettes    Quit date: 09/01/1973  . Smokeless tobacco: Never Used  . Alcohol Use: No   OB History   Grav Para Term Preterm  Abortions TAB SAB Ect Mult Living   4 4 4       4      Review of Systems  Constitutional: Negative for appetite change and fatigue.  HENT: Negative for congestion, sinus pressure and ear discharge.   Eyes: Negative for discharge.  Respiratory: Negative for cough.   Cardiovascular: Negative for chest pain.  Gastrointestinal: Negative for abdominal pain and diarrhea.  Genitourinary: Positive for dysuria. Negative for frequency and hematuria.  Musculoskeletal: Negative for back pain.  Skin: Negative for rash.  Neurological: Negative for seizures and headaches.  Psychiatric/Behavioral: Negative for hallucinations.    Allergies  Cephalexin; Ciprofloxacin; Codeine; Contrast media; Latex; Nsaids; Nutritional supplements; Penicillins; and Sulfa antibiotics  Home Medications   Current Outpatient Rx  Name  Route  Sig  Dispense  Refill  . acetaminophen (TYLENOL) 500 MG tablet   Oral   Take 500 mg by mouth every 6 (six) hours as needed. pain         . aspirin 81 MG tablet   Oral   Take 81 mg by mouth daily.         Marland Kitchen atorvastatin (LIPITOR) 20 MG tablet   Oral   Take 20 mg by mouth at bedtime.           Marland Kitchen azithromycin (ZITHROMAX) 250 MG tablet      Take as directed   6 tablet   0   .  cholecalciferol (VITAMIN D) 1000 UNITS tablet   Oral   Take 4,000 Units by mouth daily.         . clopidogrel (PLAVIX) 75 MG tablet   Oral   Take 1 tablet (75 mg total) by mouth daily.   30 tablet   6     Generic ZOX:WRUEAV    75MG  (TAB)  01/28/2012 9:54: ...   . isosorbide mononitrate (IMDUR) 30 MG 24 hr tablet   Oral   Take 1 tablet (30 mg total) by mouth daily.   30 tablet   6     Generic WUJ:WJXBJ 30 MG TABLET SA Generic YNW:GNFA ...   . lisinopril (PRINIVIL,ZESTRIL) 10 MG tablet      TAKE 1 TABLET BY MOUTH EVERY DAY   30 tablet   6     Generic OZH:YQMVHQI   10MG  Generic ONG:EXBMWUX   1 ...   . LORazepam (ATIVAN) 0.5 MG tablet   Oral   Take 1 tablet (0.5 mg total) by  mouth daily as needed for anxiety.   30 tablet   0   . nitroGLYCERIN (NITROSTAT) 0.4 MG SL tablet   Sublingual   Place 1 tablet (0.4 mg total) under the tongue every 5 (five) minutes as needed.   25 tablet   3   . pantoprazole (PROTONIX) 40 MG tablet   Oral   Take 1 tablet (40 mg total) by mouth daily at 12 noon.   30 tablet   6   . phenazopyridine (PYRIDIUM) 200 MG tablet   Oral   Take 1 tablet (200 mg total) by mouth 3 (three) times daily as needed for pain (burning with urination).   9 tablet   0    BP 152/96  Pulse 60  Temp(Src) 98.2 F (36.8 C)  Resp 20  Ht 5\' 2"  (1.575 m)  Wt 133 lb (60.328 kg)  BMI 24.32 kg/m2  SpO2 98% Physical Exam  Constitutional: She is oriented to person, place, and time. She appears well-developed.  HENT:  Head: Normocephalic.  Eyes: Conjunctivae and EOM are normal. No scleral icterus.  Neck: Neck supple. No thyromegaly present.  Cardiovascular: Normal rate and regular rhythm.  Exam reveals no gallop and no friction rub.   No murmur heard. Pulmonary/Chest: No stridor. She has no wheezes. She has no rales. She exhibits no tenderness.  Abdominal: She exhibits no distension. There is no tenderness. There is no rebound.  Musculoskeletal: Normal range of motion. She exhibits no edema.  Lymphadenopathy:    She has no cervical adenopathy.  Neurological: She is oriented to person, place, and time. Coordination normal.  Skin: No rash noted. No erythema.  Psychiatric: She has a normal mood and affect. Her behavior is normal.    ED Course  Procedures (including critical care time) Labs Reviewed  URINALYSIS, ROUTINE W REFLEX MICROSCOPIC - Abnormal; Notable for the following:    APPearance CLOUDY (*)    Specific Gravity, Urine >1.030 (*)    All other components within normal limits  URINE CULTURE   No results found. 1. Dysuria     MDM  The chart was scribed for me under my direct supervision.  I personally performed the history,  physical, and medical decision making and all procedures in the evaluation of this patient.[name], MD 03/04/13 [phone]

## 2013-03-04 NOTE — ED Notes (Signed)
Pt reports that she has been having to urinate more frequently for the last 2 days, denies any hematuria, no fever, +back pain, stated she has freq. Uti/bladder infections.

## 2013-03-06 LAB — URINE CULTURE

## 2013-03-08 ENCOUNTER — Telehealth: Payer: Self-pay | Admitting: Family Medicine

## 2013-03-08 ENCOUNTER — Ambulatory Visit (INDEPENDENT_AMBULATORY_CARE_PROVIDER_SITE_OTHER): Payer: Medicare Other | Admitting: Family Medicine

## 2013-03-08 DIAGNOSIS — N8111 Cystocele, midline: Secondary | ICD-10-CM

## 2013-03-08 DIAGNOSIS — N390 Urinary tract infection, site not specified: Secondary | ICD-10-CM

## 2013-03-10 ENCOUNTER — Encounter: Payer: Self-pay | Admitting: Family Medicine

## 2013-03-10 NOTE — Progress Notes (Signed)
Patient ID: [name], female   DOB: 1931/07/20, 79 y.o.   MRN: [id number] Not seen Maryla Morrow. Modesto Charon, M.D.

## 2013-03-11 ENCOUNTER — Ambulatory Visit (INDEPENDENT_AMBULATORY_CARE_PROVIDER_SITE_OTHER): Payer: Medicare Other | Admitting: Cardiology

## 2013-03-11 ENCOUNTER — Encounter: Payer: Self-pay | Admitting: Cardiology

## 2013-03-11 VITALS — BP 133/76 | HR 64 | Ht 63.0 in | Wt 141.4 lb

## 2013-03-11 DIAGNOSIS — E785 Hyperlipidemia, unspecified: Secondary | ICD-10-CM

## 2013-03-11 DIAGNOSIS — I2510 Atherosclerotic heart disease of native coronary artery without angina pectoris: Secondary | ICD-10-CM

## 2013-03-11 DIAGNOSIS — I10 Essential (primary) hypertension: Secondary | ICD-10-CM

## 2013-03-11 NOTE — Assessment & Plan Note (Signed)
Continue current regimen. Keep follow up with Dr. Modesto Charon.

## 2013-03-11 NOTE — Assessment & Plan Note (Signed)
She continues on Lipitor. Have typically been well controlled, last LDL 55.

## 2013-03-11 NOTE — Assessment & Plan Note (Signed)
Symptomatically stable. Plan to continue medical therapy at this point. She does have occasional chest pain symptoms, but states that these typically go away with one or 2 nitroglycerin.

## 2013-03-11 NOTE — Progress Notes (Signed)
Clinical Summary Ms. Ardito is an 79 y.o.female presenting for office followup. She was last seen in January. She continues to have relatively frequent ER visits. Also has had followup with Dr. Modesto Charon. She was seen in the [facility] ER in early July with chest pain - report reviewed. States that her symptoms were better with a single NTG. Troponin I level was negative.  She reports frequent UTIs, has followup with a urologist soon.  Also complains of tremor and "shakiness."  From cardiac perspective she does have occasional chest pain, uses nitroglycerin infrequently. We have been managing her medically. She reports compliance with her medications, states that her son assists with this.   Allergies  Allergen Reactions  . Azithromycin   . Cephalexin     Causes her to shake  . Ciprofloxacin   . Codeine     REACTION: shakiness  . Contrast Media (Iodinated Diagnostic Agents)   . Latex   . Nitrofuran Derivatives   . Nsaids   . Nutritional Supplements   . Penicillins     REACTION: shakiness  . Phenazopyridine   . Quinolones   . Sulfa Antibiotics   . Toviaz (Fesoterodine Fumarate Er)   . Trimethoprim     Current Outpatient Prescriptions  Medication Sig Dispense Refill  . acetaminophen (TYLENOL) 500 MG tablet Take 500 mg by mouth every 6 (six) hours as needed. pain      . atorvastatin (LIPITOR) 20 MG tablet Take 20 mg by mouth at bedtime.        . cholecalciferol (VITAMIN D) 1000 UNITS tablet Take 4,000 Units by mouth daily.      . clopidogrel (PLAVIX) 75 MG tablet Take 1 tablet (75 mg total) by mouth daily.  30 tablet  6  . CRANBERRY FRUIT PO Take 4,200 Units by mouth daily.      . isosorbide mononitrate (IMDUR) 30 MG 24 hr tablet Take 1 tablet (30 mg total) by mouth daily.  30 tablet  6  . lisinopril (PRINIVIL,ZESTRIL) 10 MG tablet TAKE 1 TABLET BY MOUTH EVERY DAY  30 tablet  6  . nitroGLYCERIN (NITROSTAT) 0.4 MG SL tablet Place 1 tablet (0.4 mg total) under the tongue every 5  (five) minutes as needed.  25 tablet  3  . pantoprazole (PROTONIX) 40 MG tablet Take 1 tablet (40 mg total) by mouth daily at 12 noon.  30 tablet  6  . phenazopyridine (PYRIDIUM) 200 MG tablet Take 1 tablet (200 mg total) by mouth 3 (three) times daily as needed for pain (burning with urination).  9 tablet  0   No current facility-administered medications for this visit.    Past Medical History  Diagnosis Date  . Coronary atherosclerosis of native coronary artery     DES LAD and BMS LAD 8/08  . Mixed hyperlipidemia   . GERD (gastroesophageal reflux disease)   . Essential hypertension, benign   . Anxiety   . Resting tremor   . COPD (chronic obstructive pulmonary disease)   . Rickets   . MI (myocardial infarction) 2008    Social History Ms. Stehr reports that she has never smoked. She has never used smokeless tobacco. Ms. Stanly reports that she does not drink alcohol.  Review of Systems As outlined above.  Physical Examination Filed Vitals:   03/11/13 1442  BP: 133/76  Pulse: 64   Filed Weights   03/11/13 1442  Weight: 141 lb 6.4 oz (64.139 kg)   Comfortable at rest. HEENT: Conjunctiva and lids normal,  oropharynx clear.  Neck: Supple, no elevated jugular venous pressure, no bruits.  Lungs: Clear to auscultation, nonlabored.  Cardiac: Regular rate and rhythm, no S3 gallop.  Abdomen: Soft, nontender, no bruits.  Skin: Warm and dry.  Extremities: No significant peripheral edema  Musculoskeletal: Mild kyphosis.  Neuropsychiatric: Alert and oriented x3, resting tremor. Somewhat anxious.   Problem List and Plan   CORONARY ATHEROSCLEROSIS NATIVE CORONARY ARTERY Symptomatically stable. Plan to continue medical therapy at this point. She does have occasional chest pain symptoms, but states that these typically go away with one or 2 nitroglycerin.  Essential hypertension, benign Continue current regimen. Keep follow up with Dr. Modesto Charon.  HYPERLIPIDEMIA She continues on  Lipitor. Have typically been well controlled, last LDL 55.    Jonelle Sidle, M.D., F.A.C.C.

## 2013-03-11 NOTE — Patient Instructions (Addendum)

## 2013-03-16 ENCOUNTER — Ambulatory Visit: Payer: Self-pay

## 2013-03-16 ENCOUNTER — Other Ambulatory Visit: Payer: Self-pay

## 2013-03-19 ENCOUNTER — Encounter (HOSPITAL_COMMUNITY): Payer: Self-pay | Admitting: *Deleted

## 2013-03-19 ENCOUNTER — Emergency Department (HOSPITAL_COMMUNITY)
Admission: EM | Admit: 2013-03-19 | Discharge: 2013-03-19 | Disposition: A | Discharge status: Home / Self Care | Payer: Medicare Other | Attending: Emergency Medicine | Admitting: Emergency Medicine

## 2013-03-19 ENCOUNTER — Emergency Department (HOSPITAL_COMMUNITY): Payer: Medicare Other

## 2013-03-19 DIAGNOSIS — R259 Unspecified abnormal involuntary movements: Secondary | ICD-10-CM | POA: Insufficient documentation

## 2013-03-19 DIAGNOSIS — R531 Weakness: Secondary | ICD-10-CM

## 2013-03-19 DIAGNOSIS — J4489 Other specified chronic obstructive pulmonary disease: Secondary | ICD-10-CM | POA: Insufficient documentation

## 2013-03-19 DIAGNOSIS — E550 Rickets, active: Secondary | ICD-10-CM | POA: Insufficient documentation

## 2013-03-19 DIAGNOSIS — Z7902 Long term (current) use of antithrombotics/antiplatelets: Secondary | ICD-10-CM | POA: Insufficient documentation

## 2013-03-19 DIAGNOSIS — J449 Chronic obstructive pulmonary disease, unspecified: Secondary | ICD-10-CM | POA: Insufficient documentation

## 2013-03-19 DIAGNOSIS — R5381 Other malaise: Secondary | ICD-10-CM | POA: Insufficient documentation

## 2013-03-19 DIAGNOSIS — K219 Gastro-esophageal reflux disease without esophagitis: Secondary | ICD-10-CM | POA: Insufficient documentation

## 2013-03-19 DIAGNOSIS — R300 Dysuria: Secondary | ICD-10-CM | POA: Insufficient documentation

## 2013-03-19 DIAGNOSIS — I252 Old myocardial infarction: Secondary | ICD-10-CM | POA: Insufficient documentation

## 2013-03-19 DIAGNOSIS — Z8659 Personal history of other mental and behavioral disorders: Secondary | ICD-10-CM | POA: Insufficient documentation

## 2013-03-19 DIAGNOSIS — E782 Mixed hyperlipidemia: Secondary | ICD-10-CM | POA: Insufficient documentation

## 2013-03-19 DIAGNOSIS — I2510 Atherosclerotic heart disease of native coronary artery without angina pectoris: Secondary | ICD-10-CM | POA: Insufficient documentation

## 2013-03-19 DIAGNOSIS — Z8619 Personal history of other infectious and parasitic diseases: Secondary | ICD-10-CM | POA: Insufficient documentation

## 2013-03-19 DIAGNOSIS — Z9861 Coronary angioplasty status: Secondary | ICD-10-CM | POA: Insufficient documentation

## 2013-03-19 DIAGNOSIS — Z880 Allergy status to penicillin: Secondary | ICD-10-CM | POA: Insufficient documentation

## 2013-03-19 DIAGNOSIS — I10 Essential (primary) hypertension: Secondary | ICD-10-CM | POA: Insufficient documentation

## 2013-03-19 DIAGNOSIS — Z79899 Other long term (current) drug therapy: Secondary | ICD-10-CM | POA: Insufficient documentation

## 2013-03-19 DIAGNOSIS — Z91040 Latex allergy status: Secondary | ICD-10-CM | POA: Insufficient documentation

## 2013-03-19 DIAGNOSIS — Z87440 Personal history of urinary (tract) infections: Secondary | ICD-10-CM | POA: Insufficient documentation

## 2013-03-19 LAB — CBC WITH DIFFERENTIAL/PLATELET
Eosinophils Relative: 1 % (ref 0–5)
HCT: 36.4 % (ref 36.0–46.0)
Hemoglobin: 12.2 g/dL (ref 12.0–15.0)
Lymphocytes Relative: 20 % (ref 12–46)
Lymphs Abs: 1.4 10*3/uL (ref 0.7–4.0)
MCV: 95.3 fL (ref 78.0–100.0)
Monocytes Absolute: 0.6 10*3/uL (ref 0.1–1.0)
Neutro Abs: 4.7 10*3/uL (ref 1.7–7.7)
RBC: 3.82 MIL/uL — ABNORMAL LOW (ref 3.87–5.11)
WBC: 6.8 10*3/uL (ref 4.0–10.5)

## 2013-03-19 LAB — BASIC METABOLIC PANEL
CO2: 28 mEq/L (ref 19–32)
Calcium: 9.4 mg/dL (ref 8.4–10.5)
Chloride: 102 mEq/L (ref 96–112)
Creatinine, Ser: 0.82 mg/dL (ref 0.50–1.10)
Glucose, Bld: 148 mg/dL — ABNORMAL HIGH (ref 70–99)

## 2013-03-19 LAB — URINALYSIS, ROUTINE W REFLEX MICROSCOPIC
Bilirubin Urine: NEGATIVE
Nitrite: NEGATIVE
Specific Gravity, Urine: 1.025 (ref 1.005–1.030)
Urobilinogen, UA: 0.2 mg/dL (ref 0.0–1.0)
pH: 6 (ref 5.0–8.0)

## 2013-03-19 LAB — URINE MICROSCOPIC-ADD ON

## 2013-03-19 LAB — TROPONIN I: Troponin I: <0.3 ng/mL (ref <0.30)

## 2013-03-19 LAB — LACTIC ACID, PLASMA: Lactic Acid, Venous: 0.8 mmol/L (ref 0.5–2.2)

## 2013-03-19 MED ORDER — SODIUM CHLORIDE 0.9 % IV SOLN
INTRAVENOUS | Status: DC
  Administered 2013-03-19: 14:00:00 via INTRAVENOUS

## 2013-03-19 NOTE — ED Provider Notes (Signed)
History    CSN: [id number] Arrival date & time 03/19/13  1217  First MD Initiated Contact with Patient 03/19/13 1310     Chief Complaint  Patient presents with  . Weakness   HPI Pt was seen at 1325.  Per pt, c/o gradual onset and persistence of constant generalized weakness for the past several days. Has been associated with "burning" with urination. Pt states she has been eval by her PMD and Uro MD for same, dx UTI, then "yeast infection."  Pt has been using pyridium and Monistat externally with "some" relief. Pt also states she "feels shakey" especially in the mornings. Denies fevers, no rash, no N/V/D, no abd pain, no back pain, no CP/SOB, no cough, no focal motor weakness, no tingling/numbness in extremities, no syncope.    Past Medical History  Diagnosis Date  . Coronary atherosclerosis of native coronary artery     DES LAD and BMS LAD 8/08  . Mixed hyperlipidemia   . GERD (gastroesophageal reflux disease)   . Essential hypertension, benign   . Anxiety   . Resting tremor   . COPD (chronic obstructive pulmonary disease)   . Rickets   . MI (myocardial infarction) 2008   Past Surgical History  Procedure Laterality Date  . Appendectomy    . Cholecystectomy    . Bilateral cataract surgery    . Vein ligation and stripping    . Cardiac catheterization    . Coronary angioplasty with stent placement    . Left trochanteric bursa injection  11/27/11    DR. O'TOOLE  . Left sacroiliac joint injection  11/27/11    DR. O'TOOLE  . Yag laser application  09/06/2012    Procedure: YAG LASER APPLICATION;  Surgeon: Susa Simmonds, MD;  Location: [facility];  Service: Ophthalmology;  Laterality: Left;   Family History  Problem Relation Age of Onset  . Hypertension     History  Substance Use Topics  . Smoking status: Never Smoker   . Smokeless tobacco: Never Used  . Alcohol Use: No   OB History   Grav Para Term Preterm Abortions TAB SAB Ect Mult Living   4 4 4       4      Review of  Systems ROS: Statement: All systems negative except as marked or noted in the HPI; Constitutional: Negative for fever and chills. +generalized weakness/fatigue ; ; Eyes: Negative for eye pain, redness and discharge. ; ; ENMT: Negative for ear pain, hoarseness, nasal congestion, sinus pressure and sore throat. ; ; Cardiovascular: Negative for chest pain, palpitations, diaphoresis, dyspnea and peripheral edema. ; ; Respiratory: Negative for cough, wheezing and stridor. ; ; Gastrointestinal: Negative for nausea, vomiting, diarrhea, abdominal pain, blood in stool, hematemesis, jaundice and rectal bleeding. . ; ; Genitourinary: +dysuria. Negative for flank pain and hematuria. ; ; GYN:  No vaginal bleeding, no vaginal discharge, no vulvar pain.;; Musculoskeletal: Negative for back pain and neck pain. Negative for swelling and trauma.; ; Skin: Negative for pruritus, rash, abrasions, blisters, bruising and skin lesion.; ; Neuro: Negative for headache, lightheadedness and neck stiffness. Negative for altered level of consciousness , altered mental status, extremity weakness, paresthesias, involuntary movement, seizure and syncope.       Allergies  Azithromycin; Cephalexin; Ciprofloxacin; Codeine; Contrast media; Latex; Nitrofuran derivatives; Nsaids; Nutritional supplements; Penicillins; Phenazopyridine; Quinolones; Sulfa antibiotics; Toviaz; and Trimethoprim  Home Medications   Current Outpatient Rx  Name  Route  Sig  Dispense  Refill  . acetaminophen (TYLENOL)  500 MG tablet   Oral   Take 500 mg by mouth every 6 (six) hours as needed. pain         . atorvastatin (LIPITOR) 20 MG tablet   Oral   Take 20 mg by mouth at bedtime.           . cholecalciferol (VITAMIN D) 1000 UNITS tablet   Oral   Take 4,000 Units by mouth daily.         . clopidogrel (PLAVIX) 75 MG tablet   Oral   Take 1 tablet (75 mg total) by mouth daily.   30 tablet   6     Generic YNW:GNFAOZ    75MG  (TAB)  01/28/2012 9:54:  ...   . CRANBERRY FRUIT PO   Oral   Take 4,200 Units by mouth daily.         . isosorbide mononitrate (IMDUR) 30 MG 24 hr tablet   Oral   Take 1 tablet (30 mg total) by mouth daily.   30 tablet   6     Generic HYQ:MVHQI 30 MG TABLET SA Generic ONG:EXBM ...   . lisinopril (PRINIVIL,ZESTRIL) 10 MG tablet      TAKE 1 TABLET BY MOUTH EVERY DAY   30 tablet   6     Generic WUX:LKGMWNU   10MG  Generic UVO:ZDGUYQI   1 ...   . nitroGLYCERIN (NITROSTAT) 0.4 MG SL tablet   Sublingual   Place 1 tablet (0.4 mg total) under the tongue every 5 (five) minutes as needed.   25 tablet   3   . pantoprazole (PROTONIX) 40 MG tablet   Oral   Take 1 tablet (40 mg total) by mouth daily at 12 noon.   30 tablet   6   . phenazopyridine (PYRIDIUM) 200 MG tablet   Oral   Take 1 tablet (200 mg total) by mouth 3 (three) times daily as needed for pain (burning with urination).   9 tablet   0    BP 119/53  Pulse 80  Temp(Src) 99.7 F (37.6 C) (Oral)  Resp 18  Ht 5\' 4"  (1.626 m)  Wt 135 lb (61.236 kg)  BMI 23.16 kg/m2  SpO2 98% Physical Exam 1330: Physical examination:  Nursing notes reviewed; Vital signs and O2 SAT reviewed;  Constitutional: Well developed, Well nourished, Well hydrated, In no acute distress; Head:  Normocephalic, atraumatic; Eyes: EOMI, PERRL, No scleral icterus; ENMT: Mouth and pharynx normal, Mucous membranes moist; Neck: Supple, Full range of motion, No lymphadenopathy; Cardiovascular: Regular rate and rhythm, No gallop; Respiratory: Breath sounds clear & equal bilaterally, No rales, rhonchi, wheezes.  Speaking full sentences with ease, Normal respiratory effort/excursion; Chest: Nontender, Movement normal; Abdomen: Soft, Nontender, Nondistended, Normal bowel sounds; Genitourinary: No CVA tenderness. No labial, vaginal or perineal rash, no vesicles, no swelling, no soft tissue crepitus. No vaginal discharge.; Extremities: Pulses normal, No tenderness, No edema, No calf edema  or asymmetry.; Neuro: AA&Ox3, Major CN grossly intact. No facial droop. Speech clear. No gross focal motor or sensory deficits in extremities.; Skin: Color normal, Warm, Dry.   ED Course  Procedures    MDM  MDM Reviewed: previous chart, nursing note and vitals Reviewed previous: labs and ECG Interpretation: labs, ECG and x-ray    Date: 03/19/2013  Rate: 73  Rhythm: normal sinus rhythm  QRS Axis: normal  Intervals: normal  ST/T Wave abnormalities: normal  Conduction Disutrbances:none  Narrative Interpretation:   Old EKG Reviewed: unchanged; no significant changes from previous  EKG dated 06/22/2012.  Results for orders placed during the hospital encounter of 03/19/13  CBC WITH DIFFERENTIAL      Result Value Range   WBC 6.8  4.0 - 10.5 K/uL   RBC 3.82 (*) 3.87 - 5.11 MIL/uL   Hemoglobin 12.2  12.0 - 15.0 g/dL   HCT 16.1  09.6 - 04.5 %   MCV 95.3  78.0 - 100.0 fL   MCH 31.9  26.0 - 34.0 pg   MCHC 33.5  30.0 - 36.0 g/dL   RDW 40.9  81.1 - 91.4 %   Platelets 193  150 - 400 K/uL   Neutrophils Relative % 69  43 - 77 %   Neutro Abs 4.7  1.7 - 7.7 K/uL   Lymphocytes Relative 20  12 - 46 %   Lymphs Abs 1.4  0.7 - 4.0 K/uL   Monocytes Relative 9  3 - 12 %   Monocytes Absolute 0.6  0.1 - 1.0 K/uL   Eosinophils Relative 1  0 - 5 %   Eosinophils Absolute 0.1  0.0 - 0.7 K/uL   Basophils Relative 1  0 - 1 %   Basophils Absolute 0.1  0.0 - 0.1 K/uL  BASIC METABOLIC PANEL      Result Value Range   Sodium 138  135 - 145 mEq/L   Potassium 3.7  3.5 - 5.1 mEq/L   Chloride 102  96 - 112 mEq/L   CO2 28  19 - 32 mEq/L   Glucose, Bld 148 (*) 70 - 99 mg/dL   BUN 14  6 - 23 mg/dL   Creatinine, Ser 7.82  0.50 - 1.10 mg/dL   Calcium 9.4  8.4 - 95.6 mg/dL   GFR calc non Af Amer 66 (*) >90 mL/min   GFR calc Af Amer 76 (*) >90 mL/min  LACTIC ACID, PLASMA      Result Value Range   Lactic Acid, Venous 0.8  0.5 - 2.2 mmol/L  TROPONIN I      Result Value Range   Troponin I <0.30  <0.30 ng/mL   URINALYSIS, ROUTINE W REFLEX MICROSCOPIC      Result Value Range   Color, Urine YELLOW  YELLOW   APPearance HAZY (*) CLEAR   Specific Gravity, Urine 1.025  1.005 - 1.030   pH 6.0  5.0 - 8.0   Glucose, UA NEGATIVE  NEGATIVE mg/dL   Hgb urine dipstick SMALL (*) NEGATIVE   Bilirubin Urine NEGATIVE  NEGATIVE   Ketones, ur NEGATIVE  NEGATIVE mg/dL   Protein, ur NEGATIVE  NEGATIVE mg/dL   Urobilinogen, UA 0.2  0.0 - 1.0 mg/dL   Nitrite NEGATIVE  NEGATIVE   Leukocytes, UA NEGATIVE  NEGATIVE  URINE MICROSCOPIC-ADD ON      Result Value Range   Squamous Epithelial / LPF MANY (*) RARE   WBC, UA 7-10  <3 WBC/hpf   RBC / HPF 3-6  <3 RBC/hpf   Bacteria, UA RARE  RARE   Dg Chest 2 View 03/19/2013   *RADIOLOGY REPORT*  Clinical Data: Weakness  CHEST - 2 VIEW  Comparison: 03/01/2013  Findings: Hyperinflation.  Lungs are essentially clear.  No pleural effusion or pneumothorax.  Cardiomediastinal silhouette is within normal limits.  Mild degenerative changes of the visualized thoracolumbar spine.  IMPRESSION: No evidence of acute cardiopulmonary disease.   Original Report Authenticated By: Charline Bills, M.D.    1630:  No clear UTI on Udip; UC pending. Not orthostatic. Ambulatory with steady gait. No clear  admission criteria at this time. Pt wants to go home. Family wants to take pt home. Pt states she "wants something for my nerves" but does not want to take (or has allergies to) ativan, valium and xanax. Pt states she's "too nervous to take anything."  Long d/w pt and family regarding need to f/u with PMD regarding this issue. Can try to take one tab of OTC benadryl prn for anxiety. Pt states she is "still nervous about that."  She will f/u with her PMD regarding this issue. States she already has an appt with Uro MD for this week regarding her dysuria. Dx and testing d/w pt and family.  Questions answered.  Verb understanding, agreeable to d/c home with outpt f/u.          Laray Anger,  DO 03/22/13 2226

## 2013-03-19 NOTE — ED Notes (Signed)
Pt presents to er with c/o generalized weakness, shaking that started a few days ago, denies any pain.

## 2013-03-19 NOTE — ED Notes (Signed)
Family at bedside. Ambulated patient around nurses's desk in the ED. Patient was a little wobbly when walking but gait is steady. Assisted patient to restroom, she states that it burns like fire down there after urinating. Put patient aback on monitor upon returning to the  Room.

## 2013-03-23 ENCOUNTER — Encounter: Payer: Self-pay | Admitting: Family Medicine

## 2013-03-23 ENCOUNTER — Ambulatory Visit (INDEPENDENT_AMBULATORY_CARE_PROVIDER_SITE_OTHER): Payer: Medicare Other | Admitting: Family Medicine

## 2013-03-23 VITALS — BP 145/66 | HR 67 | Temp 97.2°F | Ht 64.0 in | Wt 140.0 lb

## 2013-03-23 DIAGNOSIS — R251 Tremor, unspecified: Secondary | ICD-10-CM

## 2013-03-23 DIAGNOSIS — R259 Unspecified abnormal involuntary movements: Secondary | ICD-10-CM

## 2013-03-23 DIAGNOSIS — R350 Frequency of micturition: Secondary | ICD-10-CM

## 2013-03-23 LAB — POCT URINALYSIS DIPSTICK
Bilirubin, UA: NEGATIVE
Glucose, UA: NEGATIVE
Ketones, UA: NEGATIVE
Leukocytes, UA: NEGATIVE
Nitrite, UA: NEGATIVE
Protein, UA: NEGATIVE
Spec Grav, UA: 1.02
Urobilinogen, UA: NEGATIVE
pH, UA: 5

## 2013-03-23 LAB — POCT UA - MICROSCOPIC ONLY
Crystals, Ur, HPF, POC: NEGATIVE
Yeast, UA: NEGATIVE

## 2013-03-23 MED ORDER — LORAZEPAM 0.5 MG PO TABS: 0.5000 mg | ORAL_TABLET | Freq: Two times a day (BID) | ORAL | Status: DC | PRN

## 2013-03-23 NOTE — Progress Notes (Signed)
  Subjective:    Patient ID: [name], female    DOB: 30-Apr-1931, 79 y.o.   MRN: [id number]  HPI This 79 y.o. female presents for evaluation of weakness.  She was recently seen in the ED at [facility] and she c/o weakness.  She underwent labs, UA, cardiac monitoring, Troponin, cxr, and all were normal.  She was discharged and told to follow up with her PCP. She usually sees Dr. Modesto Charon.  She states she is bothered by her tremors she has had all her life. She is accompanied by her son who states he took her off some medicine the pulmonologist started her On and dc'd her xanax that Dr. Modesto Charon started her on for her tremors.  The son states the ED  Recommended she may benefit from ativan.  She is seeing urology for urinary frequency.   Review of Systems No chest pain, SOB, HA, dizziness, vision change, N/V, diarrhea, constipation, dysuria, urinary urgency or frequency, myalgias, arthralgias or rash.     Objective:   Physical Exam Vital signs noted  Well developed well nourished elderly female in NAD.  HEENT - Head atraumatic Normocephalic                Eyes - PERRLA, Conjuctiva - clear Sclera- Clear EOMI                Ears - EAC's Wnl TM's Wnl Gross Hearing WNL                Nose - Nares patent                 Throat - oropharanx wnl Respiratory - Lungs CTA bilateral Cardiac - RRR S1 and S2 without murmur GI - Abdomen soft Nontender and bowel sounds active x 4 Neuro - Tremors of the hands and neck.   Results for orders placed in visit on 03/23/13  POCT URINALYSIS DIPSTICK      Result Value Range   Color, UA yellow     Clarity, UA clear     Glucose, UA neg     Bilirubin, UA neg     Ketones, UA neg     Spec Grav, UA 1.020     Blood, UA trace     pH, UA 5.0     Protein, UA neg     Urobilinogen, UA negative     Nitrite, UA neg     Leukocytes, UA Negative    POCT UA - MICROSCOPIC ONLY      Result Value Range   WBC, Ur, HPF, POC 15-20     RBC, urine,  microscopic 10-25     Bacteria, U Microscopic mod     Mucus, UA large     Epithelial cells, urine per micros many     Crystals, Ur, HPF, POC neg     Casts, Ur, LPF, POC occ     Yeast, UA neg        Assessment & Plan:  Frequent urination - Plan: POCT urinalysis dipstick, POCT UA - Microscopic Only, Urine culture Ua without UTI, follow up with urology.  Tremors of nervous system - Plan: LORazepam (ATIVAN) 0.5 MG tablet one half to one po bid for tremors. See if this helps.  Discussed possible neurology referral and son states that she does not need one because She has had this all her life.  Follow up prn.

## 2013-03-23 NOTE — Patient Instructions (Signed)
Tremor  Tremor is a rhythmic, involuntary muscular contraction characterized by oscillations (to-and-fro movements) of a part of the body. The most common of all involuntary movements, tremor can affect various body parts such as the hands, head, facial structures, vocal cords, trunk, and legs; most tremors, however, occur in the hands. Tremor often accompanies neurological disorders associated with aging. Although the disorder is not life-threatening, it can be responsible for functional disability and social embarrassment.  TREATMENT   There are many types of tremor and several ways in which tremor is classified. The most common classification is by behavioral context or position. There are five categories of tremor within this classification: resting, postural, kinetic, task-specific, and psychogenic. Resting or static tremor occurs when the muscle is at rest, for example when the hands are lying on the lap. This type of tremor is often seen in patients with Parkinson's disease. Postural tremor occurs when a patient attempts to maintain posture, such as holding the hands outstretched. Postural tremors include physiological tremor, essential tremor, tremor with basal ganglia disease (also seen in patients with Parkinson's disease), cerebellar postural tremor, tremor with peripheral neuropathy, post-traumatic tremor, and alcoholic tremor. Kinetic or intention (action) tremor occurs during purposeful movement, for example during finger-to-nose testing. Task-specific tremor appears when performing goal-oriented tasks such as handwriting, speaking, or standing. This group consists of primary writing tremor, vocal tremor, and orthostatic tremor. Psychogenic tremor occurs in both older and younger patients. The key feature of this tremor is that it dramatically lessens or disappears when the patient is distracted.  PROGNOSIS  There are some treatment options available for tremor; the appropriate treatment depends on  accurate diagnosis of the cause. Some tremors respond to treatment of the underlying condition, for example in some cases of psychogenic tremor treating the patient's underlying mental problem may cause the tremor to disappear. Also, patients with tremor due to Parkinson's disease may be treated with Levodopa drug therapy. Symptomatic drug therapy is available for several other tremors as well. For those cases of tremor in which there is no effective drug treatment, physical measures such as teaching the patient to brace the affected limb during the tremor are sometimes useful. Surgical intervention such as thalamotomy or deep brain stimulation may be useful in certain cases.  Document Released: 08/08/2002 Document Revised: 11/10/2011 Document Reviewed: 08/18/2005  ExitCare® Patient Information ©2014 [employer].

## 2013-03-24 ENCOUNTER — Other Ambulatory Visit: Payer: Self-pay | Admitting: Cardiology

## 2013-03-24 MED ORDER — CLOPIDOGREL BISULFATE 75 MG PO TABS: 75.0000 mg | ORAL_TABLET | Freq: Every day | ORAL | Status: DC

## 2013-03-25 ENCOUNTER — Ambulatory Visit: Payer: Medicare Other | Admitting: Urology

## 2013-03-26 LAB — URINE CULTURE: Colony Count: 80000

## 2013-03-30 ENCOUNTER — Telehealth: Payer: Self-pay | Admitting: Family Medicine

## 2013-03-30 NOTE — Telephone Encounter (Signed)
Patient aware.

## 2013-04-05 ENCOUNTER — Telehealth: Payer: Self-pay | Admitting: Family Medicine

## 2013-04-05 NOTE — Telephone Encounter (Signed)
appt made

## 2013-04-06 ENCOUNTER — Encounter: Payer: Self-pay | Admitting: General Practice

## 2013-04-06 ENCOUNTER — Ambulatory Visit (INDEPENDENT_AMBULATORY_CARE_PROVIDER_SITE_OTHER): Payer: Medicare Other | Admitting: General Practice

## 2013-04-06 VITALS — BP 124/88 | HR 69 | Temp 97.7°F | Ht 64.0 in | Wt 141.0 lb

## 2013-04-06 DIAGNOSIS — R350 Frequency of micturition: Secondary | ICD-10-CM

## 2013-04-06 LAB — POCT URINALYSIS DIPSTICK
Bilirubin, UA: NEGATIVE
Nitrite, UA: NEGATIVE
Protein, UA: NEGATIVE
pH, UA: 5

## 2013-04-06 LAB — POCT UA - MICROSCOPIC ONLY
Crystals, Ur, HPF, POC: NEGATIVE
Yeast, UA: NEGATIVE

## 2013-04-06 NOTE — Progress Notes (Signed)
  Subjective:    Patient ID: [name], female    DOB: 06/23/1931, 79 y.o.   MRN: [id number]  Urinary Tract Infection  This is a chronic problem. The problem has been gradually worsening. The quality of the pain is described as aching and burning. The pain is at a severity of 4/10. The pain is mild. There has been no fever. She is not sexually active. There is no history of pyelonephritis. Associated symptoms include flank pain, frequency and urgency. Pertinent negatives include no chills, hematuria or nausea. Her past medical history is significant for recurrent UTIs.  Reports she has urinary tract infections frequently and has an appointment with a urologist in [place] next week.      Review of Systems  Constitutional: Negative for fever and chills.  Respiratory: Negative for chest tightness and shortness of breath.   Cardiovascular: Negative for chest pain and palpitations.  Gastrointestinal: Negative for nausea.  Genitourinary: Positive for dysuria, urgency, frequency and flank pain. Negative for hematuria.  Skin: Negative for rash.  Neurological: Negative for dizziness, weakness and headaches.       Objective:   Physical Exam  Constitutional: She is oriented to person, place, and time. She appears well-developed and well-nourished.  Cardiovascular: Normal rate, regular rhythm and normal heart sounds.   Pulmonary/Chest: Effort normal and breath sounds normal. No respiratory distress. She exhibits no tenderness.  Neurological: She is alert and oriented to person, place, and time.  Skin: Skin is warm and dry.  Psychiatric: She has a normal mood and affect.   Results for orders placed in visit on 04/06/13  POCT UA - MICROSCOPIC ONLY      Result Value Range   WBC, Ur, HPF, POC occ     RBC, urine, microscopic 30-40     Bacteria, U Microscopic few     Mucus, UA mod     Epithelial cells, urine per micros few     Crystals, Ur, HPF, POC neg     Casts, Ur, LPF, POC occ     Yeast,  UA neg    POCT URINALYSIS DIPSTICK      Result Value Range   Color, UA yellow     Clarity, UA clear     Glucose, UA neg     Bilirubin, UA neg     Ketones, UA neg     Spec Grav, UA 1.020     Blood, UA mod     pH, UA 5.0     Protein, UA neg     Urobilinogen, UA negative     Nitrite, UA neg     Leukocytes, UA Negative            Assessment & Plan:  1. Frequency of urination - POCT UA - Microscopic Only - POCT urinalysis dipstick -Discussed proper perineal hygiene -maintain appointment with urologist -RTO if symptoms worsen -Patient verbalized understanding -Coralie Keens, FNP-C

## 2013-04-25 ENCOUNTER — Other Ambulatory Visit: Payer: Self-pay | Admitting: Cardiology

## 2013-04-25 MED ORDER — PANTOPRAZOLE SODIUM 40 MG PO TBEC: 40.0000 mg | DELAYED_RELEASE_TABLET | Freq: Every day | ORAL | Status: DC

## 2013-05-10 ENCOUNTER — Encounter: Payer: PRIVATE HEALTH INSURANCE | Admitting: Obstetrics & Gynecology

## 2013-06-23 ENCOUNTER — Other Ambulatory Visit: Payer: Self-pay | Admitting: Cardiology

## 2013-07-22 ENCOUNTER — Other Ambulatory Visit: Payer: Self-pay | Admitting: Family Medicine

## 2013-07-25 ENCOUNTER — Other Ambulatory Visit: Payer: Self-pay

## 2013-07-25 DIAGNOSIS — R251 Tremor, unspecified: Secondary | ICD-10-CM

## 2013-07-25 NOTE — Telephone Encounter (Signed)
Last lipids 3/14    Dr Modesto Charon

## 2013-07-25 NOTE — Telephone Encounter (Signed)
Last seen 04/06/13  Mae  If approved route to nurse to phone into [facility]  [phone]

## 2013-07-26 ENCOUNTER — Other Ambulatory Visit: Payer: Self-pay | Admitting: General Practice

## 2013-07-26 MED ORDER — LORAZEPAM 0.5 MG PO TABS: 0.5000 mg | ORAL_TABLET | Freq: Two times a day (BID) | ORAL | Status: DC | PRN

## 2013-07-26 NOTE — Telephone Encounter (Signed)
Please phone in

## 2013-07-27 ENCOUNTER — Other Ambulatory Visit: Payer: Self-pay | Admitting: Family Medicine

## 2013-07-27 NOTE — Telephone Encounter (Signed)
[name] notifed of refill

## 2013-07-27 NOTE — Telephone Encounter (Signed)
Spoke with [place] at [facility] --ok to refill lorazepam

## 2013-07-27 NOTE — Telephone Encounter (Signed)
Patient needs to be seen. Has exceeded time since last visit. Limited quantity refilled. Needs to bring all medications to next appointment.   

## 2013-08-16 ENCOUNTER — Encounter: Payer: Self-pay | Admitting: General Practice

## 2013-08-16 ENCOUNTER — Ambulatory Visit (INDEPENDENT_AMBULATORY_CARE_PROVIDER_SITE_OTHER): Payer: Medicare Other | Admitting: General Practice

## 2013-08-16 VITALS — BP 133/64 | HR 69 | Temp 97.4°F | Ht 64.0 in | Wt 144.0 lb

## 2013-08-16 DIAGNOSIS — J309 Allergic rhinitis, unspecified: Secondary | ICD-10-CM

## 2013-08-16 NOTE — Patient Instructions (Signed)
Allergic Rhinitis Allergic rhinitis is when the mucous membranes in the nose respond to allergens. Allergens are particles in the air that cause your body to have an allergic reaction. This causes you to release allergic antibodies. Through a chain of events, these eventually cause you to release histamine into the blood stream (hence the use of antihistamines). Although meant to be protective to the body, it is this release that causes your discomfort, such as frequent sneezing, congestion and an itchy runny nose.  CAUSES  The pollen allergens may come from grasses, trees, and weeds. This is seasonal allergic rhinitis, or "hay fever." Other allergens cause year-round allergic rhinitis (perennial allergic rhinitis) such as house dust mite allergen, pet dander and mold spores.  SYMPTOMS   Nasal stuffiness (congestion).  Runny, itchy nose with sneezing and tearing of the eyes.  There is often an itching of the mouth, eyes and ears. It cannot be cured, but it can be controlled with medications. DIAGNOSIS  If you are unable to determine the offending allergen, skin or blood testing may find it. TREATMENT   Avoid the allergen.  Medications and allergy shots (immunotherapy) can help.  Hay fever may often be treated with antihistamines in pill or nasal spray forms. Antihistamines block the effects of histamine. There are over-the-counter medicines that may help with nasal congestion and swelling around the eyes. Check with your caregiver before taking or giving this medicine. If the treatment above does not work, there are many new medications your caregiver can prescribe. Stronger medications may be used if initial measures are ineffective. Desensitizing injections can be used if medications and avoidance fails. Desensitization is when a patient is given ongoing shots until the body becomes less sensitive to the allergen. Make sure you follow up with your caregiver if problems continue. SEEK MEDICAL  CARE IF:   You develop fever (more than 100.5 F (38.1 C).  You develop a cough that does not stop easily (persistent).  You have shortness of breath.  You start wheezing.  Symptoms interfere with normal daily activities. Document Released: 05/13/2001 Document Revised: 11/10/2011 Document Reviewed: 11/22/2008 ExitCare Patient Information 2014 [employer].  

## 2013-08-16 NOTE — Progress Notes (Signed)
   Subjective:    Patient ID: [name], female    DOB: 1931-06-12, 79 y.o.   MRN: [id number]  Sore Throat  This is a new problem. The current episode started in the past 7 days (only sore upon awakening in am). The problem has been unchanged. Neither side of throat is experiencing more pain than the other. There has been no fever. The pain is at a severity of 2/10. Associated symptoms include congestion. Pertinent negatives include no coughing, headaches, plugged ear sensation, shortness of breath, swollen glands, trouble swallowing or vomiting. She has tried nothing for the symptoms.      Review of Systems  Constitutional: Negative for fever and chills.  HENT: Positive for congestion and sore throat. Negative for postnasal drip, sinus pressure, sneezing and trouble swallowing.   Respiratory: Negative for cough, chest tightness and shortness of breath.   Cardiovascular: Negative for chest pain and palpitations.  Gastrointestinal: Negative for vomiting.  Neurological: Negative for headaches.       Objective:   Physical Exam  Constitutional: She is oriented to person, place, and time. She appears well-developed and well-nourished.  HENT:  Head: Normocephalic and atraumatic.  Right Ear: External ear normal.  Left Ear: External ear normal.  Mouth/Throat: Posterior oropharyngeal erythema present.  Cardiovascular: Normal rate, regular rhythm and normal heart sounds.   Pulmonary/Chest: Effort normal and breath sounds normal. No respiratory distress. She exhibits no tenderness.  Neurological: She is alert and oriented to person, place, and time.  Skin: Skin is warm and dry.  Psychiatric: She has a normal mood and affect.          Assessment & Plan:  1. Allergic rhinitis -may continue OTC antihistamine as directed -RTO prn -Patient verbalized understanding Coralie Keens, FNP-C

## 2013-08-25 ENCOUNTER — Emergency Department (HOSPITAL_COMMUNITY)
Admission: EM | Admit: 2013-08-25 | Discharge: 2013-08-25 | Disposition: A | Discharge status: Home / Self Care | Payer: Medicare Other | Attending: Emergency Medicine | Admitting: Emergency Medicine

## 2013-08-25 ENCOUNTER — Encounter (HOSPITAL_COMMUNITY): Payer: Self-pay | Admitting: Emergency Medicine

## 2013-08-25 DIAGNOSIS — Z91040 Latex allergy status: Secondary | ICD-10-CM | POA: Insufficient documentation

## 2013-08-25 DIAGNOSIS — Z7902 Long term (current) use of antithrombotics/antiplatelets: Secondary | ICD-10-CM | POA: Insufficient documentation

## 2013-08-25 DIAGNOSIS — R5381 Other malaise: Secondary | ICD-10-CM | POA: Diagnosis not present

## 2013-08-25 DIAGNOSIS — I2510 Atherosclerotic heart disease of native coronary artery without angina pectoris: Secondary | ICD-10-CM | POA: Diagnosis not present

## 2013-08-25 DIAGNOSIS — I252 Old myocardial infarction: Secondary | ICD-10-CM | POA: Diagnosis not present

## 2013-08-25 DIAGNOSIS — Z79899 Other long term (current) drug therapy: Secondary | ICD-10-CM | POA: Insufficient documentation

## 2013-08-25 DIAGNOSIS — E782 Mixed hyperlipidemia: Secondary | ICD-10-CM | POA: Insufficient documentation

## 2013-08-25 DIAGNOSIS — F411 Generalized anxiety disorder: Secondary | ICD-10-CM | POA: Diagnosis not present

## 2013-08-25 DIAGNOSIS — K219 Gastro-esophageal reflux disease without esophagitis: Secondary | ICD-10-CM | POA: Insufficient documentation

## 2013-08-25 DIAGNOSIS — J449 Chronic obstructive pulmonary disease, unspecified: Secondary | ICD-10-CM | POA: Insufficient documentation

## 2013-08-25 DIAGNOSIS — R259 Unspecified abnormal involuntary movements: Secondary | ICD-10-CM | POA: Diagnosis present

## 2013-08-25 DIAGNOSIS — R5383 Other fatigue: Secondary | ICD-10-CM

## 2013-08-25 DIAGNOSIS — I10 Essential (primary) hypertension: Secondary | ICD-10-CM | POA: Diagnosis not present

## 2013-08-25 DIAGNOSIS — Z880 Allergy status to penicillin: Secondary | ICD-10-CM | POA: Insufficient documentation

## 2013-08-25 DIAGNOSIS — R682 Dry mouth, unspecified: Secondary | ICD-10-CM

## 2013-08-25 DIAGNOSIS — K117 Disturbances of salivary secretion: Secondary | ICD-10-CM | POA: Insufficient documentation

## 2013-08-25 DIAGNOSIS — Z9861 Coronary angioplasty status: Secondary | ICD-10-CM | POA: Insufficient documentation

## 2013-08-25 DIAGNOSIS — J4489 Other specified chronic obstructive pulmonary disease: Secondary | ICD-10-CM | POA: Insufficient documentation

## 2013-08-25 LAB — URINALYSIS, ROUTINE W REFLEX MICROSCOPIC
Bilirubin Urine: NEGATIVE
Ketones, ur: NEGATIVE mg/dL
Nitrite: NEGATIVE
Protein, ur: NEGATIVE mg/dL
Specific Gravity, Urine: 1.02 (ref 1.005–1.030)
Urobilinogen, UA: 0.2 mg/dL (ref 0.0–1.0)

## 2013-08-25 LAB — URINE MICROSCOPIC-ADD ON

## 2013-08-25 LAB — CBC WITH DIFFERENTIAL/PLATELET
Basophils Relative: 1 % (ref 0–1)
Eosinophils Absolute: 0.2 10*3/uL (ref 0.0–0.7)
Eosinophils Relative: 3 % (ref 0–5)
HCT: 36.5 % (ref 36.0–46.0)
Hemoglobin: 11.9 g/dL — ABNORMAL LOW (ref 12.0–15.0)
MCH: 31.1 pg (ref 26.0–34.0)
MCHC: 32.6 g/dL (ref 30.0–36.0)
MCV: 95.3 fL (ref 78.0–100.0)
Monocytes Relative: 8 % (ref 3–12)
Neutrophils Relative %: 64 % (ref 43–77)
Platelets: 169 10*3/uL (ref 150–400)
RBC: 3.83 MIL/uL — ABNORMAL LOW (ref 3.87–5.11)
WBC: 6 10*3/uL (ref 4.0–10.5)

## 2013-08-25 LAB — BASIC METABOLIC PANEL
BUN: 18 mg/dL (ref 6–23)
Calcium: 8.9 mg/dL (ref 8.4–10.5)
GFR calc Af Amer: 66 mL/min — ABNORMAL LOW (ref >90)
GFR calc non Af Amer: 57 mL/min — ABNORMAL LOW (ref >90)
Potassium: 3.6 mEq/L (ref 3.5–5.1)

## 2013-08-25 NOTE — ED Notes (Signed)
Describes having a "spell" where she gets "shakey" and then has a dry mouth.  Tried to drink something but did not help. Denies any pain.  Has a history of anxiety attacks.  States it makes her feel weak when she gets [employer]

## 2013-08-25 NOTE — ED Provider Notes (Signed)
CSN: [id number]     Arrival date & time 08/25/13  0140 History   First MD Initiated Contact with Patient 08/25/13 [phone]     Chief Complaint  Patient presents with  . Shaking   (Consider location/radiation/quality/duration/timing/severity/associated sxs/prior Treatment) HPI Comments: 79 year old female who lives with her son, she has had several discrete episodes of feeling like her mouth is very dry, she has had 2 or 3 days of generalized fatigue and weakness, she has had no trouble walking, no focal weakness, no fevers chills nausea or vomiting. She does have difficulty with chronic dysuria and has been seen by multiple different urologist without a definite etiology. Her symptoms are intermittent, nothing seems to make it better or worse, the symptoms are mild to moderate. When she was seen by her family doctor earlier this week she was told that she had allergic rhinitis and told to take an over-the-counter antihistamine per the patient's report.  She denies taking any other medications other than her prescribed medications.  The history is provided by the patient and a relative.    Past Medical History  Diagnosis Date  . Coronary atherosclerosis of native coronary artery     DES LAD and BMS LAD 8/08  . Mixed hyperlipidemia   . GERD (gastroesophageal reflux disease)   . Essential hypertension, benign   . Anxiety   . Resting tremor   . COPD (chronic obstructive pulmonary disease)   . Rickets   . MI (myocardial infarction) 2008   Past Surgical History  Procedure Laterality Date  . Appendectomy    . Cholecystectomy    . Bilateral cataract surgery    . Vein ligation and stripping    . Cardiac catheterization    . Coronary angioplasty with stent placement    . Left trochanteric bursa injection  11/27/11    DR. O'TOOLE  . Left sacroiliac joint injection  11/27/11    DR. O'TOOLE  . Yag laser application  09/06/2012    Procedure: YAG LASER APPLICATION;  Surgeon: Susa Simmonds, MD;   Location: [facility];  Service: Ophthalmology;  Laterality: Left;   Family History  Problem Relation Age of Onset  . Hypertension     History  Substance Use Topics  . Smoking status: Never Smoker   . Smokeless tobacco: Never Used  . Alcohol Use: No   OB History   Grav Para Term Preterm Abortions TAB SAB Ect Mult Living   4 4 4       4      Review of Systems  All other systems reviewed and are negative.    Allergies  Azithromycin; Cephalexin; Ciprofloxacin; Codeine; Contrast media; Latex; Lorazepam; Nitrofuran derivatives; Nsaids; Nutritional supplements; Penicillins; Phenazopyridine; Quinolones; Sulfa antibiotics; Toviaz; and Trimethoprim  Home Medications   Current Outpatient Rx  Name  Route  Sig  Dispense  Refill  . acetaminophen (TYLENOL) 500 MG tablet   Oral   Take 500 mg by mouth every 6 (six) hours as needed. pain         . atorvastatin (LIPITOR) 20 MG tablet      TAKE 1 TABLET BY MOUTH AT BEDTIME - EMERGENCY REFILL FAXED DR   30 tablet   0     Needs to be seen. Past due for labwork and visit.  ...   . cholecalciferol (VITAMIN D) 1000 UNITS tablet   Oral   Take 4,000 Units by mouth daily.         . clopidogrel (PLAVIX) 75 MG tablet  Oral   Take 1 tablet (75 mg total) by mouth daily.   30 tablet   6     Generic ZOX:WRUEAV    75MG  (TAB)  01/28/2012 9:54: ...   . CRANBERRY FRUIT PO   Oral   Take 4,200 Units by mouth daily.         . isosorbide mononitrate (IMDUR) 30 MG 24 hr tablet   Oral   Take 1 tablet (30 mg total) by mouth daily.   30 tablet   6     Generic WUJ:WJXBJ 30 MG TABLET SA Generic YNW:GNFA ...   . lisinopril (PRINIVIL,ZESTRIL) 10 MG tablet   Oral   Take 10 mg by mouth daily.         Marland Kitchen lisinopril (PRINIVIL,ZESTRIL) 10 MG tablet      TAKE 1 TABLET BY MOUTH EVERY DAY   30 tablet   2     Generic OZH:YQMVHQI   10MG    . LORazepam (ATIVAN) 0.5 MG tablet   Oral   Take 1 tablet (0.5 mg total) by mouth 2 (two) times daily as  needed for anxiety.   60 tablet   0   . nitroGLYCERIN (NITROSTAT) 0.4 MG SL tablet   Sublingual   Place 1 tablet (0.4 mg total) under the tongue every 5 (five) minutes as needed.   25 tablet   3   . pantoprazole (PROTONIX) 40 MG tablet   Oral   Take 1 tablet (40 mg total) by mouth daily at 12 noon.   30 tablet   6    BP 142/68  Pulse 63  Temp(Src) 97.6 F (36.4 C) (Oral)  Resp 18  Ht 5\' 4"  (1.626 m)  Wt 135 lb (61.236 kg)  BMI 23.16 kg/m2  SpO2 97% Physical Exam  Nursing note and vitals reviewed. Constitutional: She appears well-developed and well-nourished. No distress.  HENT:  Head: Normocephalic and atraumatic.  Mouth/Throat: Oropharynx is clear and moist. No oropharyngeal exudate.  Mucous membranes are moist, oropharynx is clear without asymmetry exudate hypertrophy or erythema  Eyes: Conjunctivae and EOM are normal. Pupils are equal, round, and reactive to light. Right eye exhibits no discharge. Left eye exhibits no discharge. No scleral icterus.  Neck: Normal range of motion. Neck supple. No JVD present. No thyromegaly present.  Cardiovascular: Normal rate, regular rhythm, normal heart sounds and intact distal pulses.  Exam reveals no gallop and no friction rub.   No murmur heard. Pulmonary/Chest: Effort normal and breath sounds normal. No respiratory distress. She has no wheezes. She has no rales.  Abdominal: Soft. Bowel sounds are normal. She exhibits no distension and no mass. There is no tenderness.  Musculoskeletal: Normal range of motion. She exhibits no edema and no tenderness.  Lymphadenopathy:    She has no cervical adenopathy.  Neurological: She is alert. Coordination normal.  Normal strength sensation and reflexes of the bilateral lower extremities  Skin: Skin is warm and dry. No rash noted. No erythema.  Psychiatric: She has a normal mood and affect. Her behavior is normal.    ED Course  Procedures (including critical care time) Labs Review Labs  Reviewed  CBC WITH DIFFERENTIAL - Abnormal; Notable for the following:    RBC 3.83 (*)    Hemoglobin 11.9 (*)    All other components within normal limits  BASIC METABOLIC PANEL - Abnormal; Notable for the following:    Glucose, Bld 106 (*)    GFR calc non Af Amer 57 (*)  GFR calc Af Amer 66 (*)    All other components within normal limits  URINALYSIS, ROUTINE W REFLEX MICROSCOPIC - Abnormal; Notable for the following:    Hgb urine dipstick TRACE (*)    Leukocytes, UA TRACE (*)    All other components within normal limits  URINE MICROSCOPIC-ADD ON - Abnormal; Notable for the following:    Squamous Epithelial / LPF MANY (*)    Bacteria, UA MANY (*)    All other components within normal limits  URINE CULTURE   Imaging Review No results found.  EKG Interpretation    Date/Time:  Thursday August 25 2013 02:04:41 EST Ventricular Rate:  59 PR Interval:  128 QRS Duration: 76 QT Interval:  402 QTC Calculation: 397 R Axis:   49 Text Interpretation:  Sinus bradycardia Otherwise normal ECG When compared with ECG of 19-Mar-2013 12:26, No significant change was found Confirmed by Kaysa Roulhac  MD, Benancio Osmundson ([id number]) on 08/25/2013 2:45:17 AM            MDM   1. Fatigue   2. Dry mouth    The patient has no focal weakness numbness, speech is normal, cranial nerves III through XII are normal, she has no edema, soft abdomen, clear heart and lung sounds and has no acute distress. Her vital signs are totally normal given her age, we'll check electrolytes, urinalysis, anticipate discharge is normal. Reassurance given. The patient does have some anxiety, she appears mildly anxious but is not having any focal findings on exam suggestive neurologic source of her weakness or to suggest a pathologic source of her dry mouth.  The patient and family member informed of results, blood work unremarkable, urinalysis without any heart signs of infection, urine culture sent.  Patient stable for  discharge  Filed Vitals:   08/25/13 0154  BP: 142/68  Pulse: 63  Temp: 97.6 F (36.4 C)  TempSrc: Oral  Resp: 18  Height: 5\' 4"  (1.626 m)  Weight: 135 lb (61.236 kg)  SpO2: 97%     Vida Roller, MD 08/25/13 [phone]

## 2013-08-25 NOTE — ED Notes (Signed)
Generalized complaint of intermittent spells of shaking, weakness and dry mouth.  Denies pain.  Has eaten today without difficulty.

## 2013-08-26 LAB — URINE CULTURE
Colony Count: NO GROWTH
Culture: NO GROWTH

## 2013-08-30 ENCOUNTER — Telehealth: Payer: Self-pay | Admitting: Family Medicine

## 2013-09-05 ENCOUNTER — Encounter: Payer: Self-pay | Admitting: Cardiology

## 2013-09-05 ENCOUNTER — Ambulatory Visit (INDEPENDENT_AMBULATORY_CARE_PROVIDER_SITE_OTHER): Payer: Medicare HMO | Admitting: Cardiology

## 2013-09-05 VITALS — BP 144/82 | HR 57 | Ht 59.0 in | Wt 145.0 lb

## 2013-09-05 DIAGNOSIS — K219 Gastro-esophageal reflux disease without esophagitis: Secondary | ICD-10-CM

## 2013-09-05 DIAGNOSIS — I2510 Atherosclerotic heart disease of native coronary artery without angina pectoris: Secondary | ICD-10-CM

## 2013-09-05 DIAGNOSIS — E785 Hyperlipidemia, unspecified: Secondary | ICD-10-CM

## 2013-09-05 DIAGNOSIS — I10 Essential (primary) hypertension: Secondary | ICD-10-CM

## 2013-09-05 MED ORDER — ISOSORBIDE MONONITRATE ER 30 MG PO TB24: 30.0000 mg | ORAL_TABLET | Freq: Two times a day (BID) | ORAL | Status: DC

## 2013-09-05 NOTE — Progress Notes (Signed)
Clinical Summary [name] is an 80 y.o.female last seen in July 2014. She comes in today for a regular visit. She reports intermittent chest pain symptoms, no clear precipitant, although does suffer with anxiety. She reports improvement with nitroglycerin at times. We reviewed her medications.  Cardiac catheterization in October 2012 showed patent stents within the mid to distal LAD, moderate disease within the diagonal that has been managed medically.   Recent ECG from December 25 showed sinus bradycardia, otherwise normal.    Allergies  Allergen Reactions  . Azithromycin   . Cephalexin     Causes her to shake  . Ciprofloxacin   . Codeine     REACTION: shakiness  . Contrast Media [Iodinated Diagnostic Agents]   . Latex   . Lorazepam Other (See Comments)    Patient states she can not take  . Nitrofuran Derivatives   . Nsaids   . Nutritional Supplements   . Penicillins     REACTION: shakiness  . Phenazopyridine   . Quinolones   . Sulfa Antibiotics   . Toviaz [Fesoterodine Fumarate Er]   . Trimethoprim     Current Outpatient Prescriptions  Medication Sig Dispense Refill  . acetaminophen (TYLENOL) 500 MG tablet Take 500 mg by mouth every 6 (six) hours as needed. pain      . atorvastatin (LIPITOR) 20 MG tablet TAKE 1 TABLET BY MOUTH AT BEDTIME - EMERGENCY REFILL FAXED DR  30 tablet  0  . calcium citrate-vitamin D (CITRACAL+D) 315-200 MG-UNIT per tablet Take 1 tablet by mouth daily.      . cholecalciferol (VITAMIN D) 1000 UNITS tablet Take 4,000 Units by mouth daily.      . clopidogrel (PLAVIX) 75 MG tablet Take 1 tablet (75 mg total) by mouth daily.  30 tablet  6  . ESTRACE VAGINAL 0.1 MG/GM vaginal cream Place 1 Applicatorful vaginally 3 (three) times a week.       . isosorbide mononitrate (IMDUR) 30 MG 24 hr tablet Take 1 tablet (30 mg total) by mouth 2 (two) times daily.  60 tablet  6  . lisinopril (PRINIVIL,ZESTRIL) 10 MG tablet TAKE 1 TABLET BY MOUTH EVERY DAY  30  tablet  2  . LORazepam (ATIVAN) 0.5 MG tablet Take 1 tablet (0.5 mg total) by mouth 2 (two) times daily as needed for anxiety.  60 tablet  0  . nitroGLYCERIN (NITROSTAT) 0.4 MG SL tablet Place 1 tablet (0.4 mg total) under the tongue every 5 (five) minutes as needed.  25 tablet  3  . pantoprazole (PROTONIX) 40 MG tablet Take 1 tablet (40 mg total) by mouth daily at 12 noon.  30 tablet  6   No current facility-administered medications for this visit.    Past Medical History  Diagnosis Date  . Coronary atherosclerosis of native coronary artery     DES LAD and BMS LAD 8/08  . Mixed hyperlipidemia   . GERD (gastroesophageal reflux disease)   . Essential hypertension, benign   . Anxiety   . Resting tremor   . COPD (chronic obstructive pulmonary disease)   . Rickets   . MI (myocardial infarction) 2008    Social History Ms. Durrell reports that she has never smoked. She has never used smokeless tobacco. Ms. Russman reports that she does not drink alcohol.  Review of Systems  No palpitations. Chronic tremor. Stable appetite. Reported falls or bleeding problems.   Physical Examination Filed Vitals:   09/05/13 1334  BP: 144/82  Pulse:  57   Filed Weights   09/05/13 1334  Weight: 145 lb (65.772 kg)    Comfortable at rest.  HEENT: Conjunctiva and lids normal, oropharynx clear.  Neck: Supple, no elevated jugular venous pressure, no bruits.  Lungs: Clear to auscultation, nonlabored.  Cardiac: Regular rate and rhythm, no S3 gallop.  Abdomen: Soft, nontender, no bruits.  Skin: Warm and dry.  Extremities: No significant peripheral edema  Musculoskeletal: Mild kyphosis.  Neuropsychiatric: Alert and oriented x3, resting tremor. Somewhat anxious.   Problem List and Plan   CORONARY ATHEROSCLEROSIS NATIVE CORONARY ARTERY History reviewed above. She is reporting chest pain symptoms which could be anginal, although also has a component of anxiety that seems to be related. We will continue  medical therapy, increase Imdur to 30 mg twice daily. Followup arranged.  Essential hypertension, benign Continue medical therapy, keep followup with Dr. Modesto CharonWong.  HYPERLIPIDEMIA She continues on Lipitor.  GERD On PPI. In case she has any component of esophageal spasm, increasing her long-acting nitrates may also help.    Jonelle SidleSamuel G. Autumn Gunn, M.D., F.A.C.C.

## 2013-09-05 NOTE — Assessment & Plan Note (Signed)
She continues on Lipitor. 

## 2013-09-05 NOTE — Assessment & Plan Note (Signed)
On PPI. In case she has any component of esophageal spasm, increasing her long-acting nitrates may also help.

## 2013-09-05 NOTE — Patient Instructions (Signed)
Your physician recommends that you schedule a follow-up appointment in: 6 months. You will receive a reminder letter in the mail in about 4 months reminding you to call and schedule your appointment. If you don't receive this letter, please contact our office. Your physician has recommended you make the following change in your medication: Increase isosorbide mononitrate 30 mg to twice daily. Your new prescription has been sent to your pharmacy. All other medications will remain the same.

## 2013-09-05 NOTE — Assessment & Plan Note (Signed)
Continue medical therapy, keep followup with Dr. Modesto CharonWong.

## 2013-09-05 NOTE — Telephone Encounter (Signed)
No return call from patient.

## 2013-09-05 NOTE — Assessment & Plan Note (Signed)
History reviewed above. She is reporting chest pain symptoms which could be anginal, although also has a component of anxiety that seems to be related. We will continue medical therapy, increase Imdur to 30 mg twice daily. Followup arranged.

## 2013-09-06 ENCOUNTER — Other Ambulatory Visit: Payer: Self-pay

## 2013-09-09 ENCOUNTER — Encounter: Payer: Self-pay | Admitting: Family Medicine

## 2013-09-09 ENCOUNTER — Ambulatory Visit (INDEPENDENT_AMBULATORY_CARE_PROVIDER_SITE_OTHER): Payer: Medicare HMO | Admitting: Family Medicine

## 2013-09-09 ENCOUNTER — Ambulatory Visit: Payer: PRIVATE HEALTH INSURANCE | Admitting: Cardiology

## 2013-09-09 VITALS — BP 142/82 | HR 62 | Temp 98.6°F | Ht 59.0 in | Wt 145.0 lb

## 2013-09-09 DIAGNOSIS — J069 Acute upper respiratory infection, unspecified: Secondary | ICD-10-CM

## 2013-09-09 DIAGNOSIS — Z1211 Encounter for screening for malignant neoplasm of colon: Secondary | ICD-10-CM

## 2013-09-09 MED ORDER — DOXYCYCLINE HYCLATE 100 MG PO TABS: 100.0000 mg | ORAL_TABLET | Freq: Two times a day (BID) | ORAL | Status: DC

## 2013-09-09 NOTE — Progress Notes (Signed)
   Subjective:    Patient ID: [name], female    DOB: June 08, 1931, 80 y.o.   MRN: [id number]  HPI This 80 y.o. female presents for evaluation of sore throat for 2 days. She has been ill for the last few days.   Review of Systems No chest pain, SOB, HA, dizziness, vision change, N/V, diarrhea, constipation, dysuria, urinary urgency or frequency, myalgias, arthralgias or rash.     Objective:   Physical Exam  Vital signs noted  Well developed well nourished female.  HEENT - Head atraumatic Normocephalic                Eyes - PERRLA, Conjuctiva - clear Sclera- Clear EOMI                Ears - EAC's Wnl TM's Wnl Gross Hearing WNL                Throat - oropharanx wnl Respiratory - Lungs CTA bilateral Cardiac - RRR S1 and S2 without murmur GI - Abdomen soft Nontender and bowel sounds active x 4 Extremities - No edema. Neuro - Grossly intact.      Assessment & Plan:  URI (upper respiratory infection) - Plan: doxycycline (VIBRA-TABS) 100 MG tablet Push po fluids, rest, tylenol and motrin otc prn as directed for fever, arthralgias, and myalgias.  Follow up prn if sx's continue or persist.  [name]  [name]

## 2013-09-17 ENCOUNTER — Emergency Department (HOSPITAL_COMMUNITY)
Admission: EM | Admit: 2013-09-17 | Discharge: 2013-09-17 | Disposition: A | Discharge status: Home / Self Care | Payer: Medicare HMO | Attending: Emergency Medicine | Admitting: Emergency Medicine

## 2013-09-17 ENCOUNTER — Encounter (HOSPITAL_COMMUNITY): Payer: Self-pay | Admitting: Emergency Medicine

## 2013-09-17 ENCOUNTER — Emergency Department (HOSPITAL_COMMUNITY): Payer: Medicare HMO

## 2013-09-17 DIAGNOSIS — R5381 Other malaise: Secondary | ICD-10-CM | POA: Insufficient documentation

## 2013-09-17 DIAGNOSIS — J4489 Other specified chronic obstructive pulmonary disease: Secondary | ICD-10-CM | POA: Insufficient documentation

## 2013-09-17 DIAGNOSIS — F411 Generalized anxiety disorder: Secondary | ICD-10-CM | POA: Insufficient documentation

## 2013-09-17 DIAGNOSIS — R5383 Other fatigue: Principal | ICD-10-CM

## 2013-09-17 DIAGNOSIS — Z792 Long term (current) use of antibiotics: Secondary | ICD-10-CM | POA: Insufficient documentation

## 2013-09-17 DIAGNOSIS — Z91040 Latex allergy status: Secondary | ICD-10-CM | POA: Insufficient documentation

## 2013-09-17 DIAGNOSIS — I2510 Atherosclerotic heart disease of native coronary artery without angina pectoris: Secondary | ICD-10-CM | POA: Insufficient documentation

## 2013-09-17 DIAGNOSIS — J449 Chronic obstructive pulmonary disease, unspecified: Secondary | ICD-10-CM | POA: Insufficient documentation

## 2013-09-17 DIAGNOSIS — Z9861 Coronary angioplasty status: Secondary | ICD-10-CM | POA: Insufficient documentation

## 2013-09-17 DIAGNOSIS — K219 Gastro-esophageal reflux disease without esophagitis: Secondary | ICD-10-CM | POA: Insufficient documentation

## 2013-09-17 DIAGNOSIS — I10 Essential (primary) hypertension: Secondary | ICD-10-CM | POA: Insufficient documentation

## 2013-09-17 DIAGNOSIS — E782 Mixed hyperlipidemia: Secondary | ICD-10-CM | POA: Insufficient documentation

## 2013-09-17 DIAGNOSIS — Z880 Allergy status to penicillin: Secondary | ICD-10-CM | POA: Insufficient documentation

## 2013-09-17 DIAGNOSIS — N390 Urinary tract infection, site not specified: Secondary | ICD-10-CM | POA: Diagnosis present

## 2013-09-17 DIAGNOSIS — Z7902 Long term (current) use of antithrombotics/antiplatelets: Secondary | ICD-10-CM | POA: Insufficient documentation

## 2013-09-17 DIAGNOSIS — Z9089 Acquired absence of other organs: Secondary | ICD-10-CM | POA: Insufficient documentation

## 2013-09-17 DIAGNOSIS — R531 Weakness: Secondary | ICD-10-CM

## 2013-09-17 DIAGNOSIS — Z79899 Other long term (current) drug therapy: Secondary | ICD-10-CM | POA: Insufficient documentation

## 2013-09-17 DIAGNOSIS — I252 Old myocardial infarction: Secondary | ICD-10-CM | POA: Insufficient documentation

## 2013-09-17 LAB — CBC WITH DIFFERENTIAL/PLATELET
Basophils Absolute: 0 10*3/uL (ref 0.0–0.1)
Basophils Relative: 1 % (ref 0–1)
EOS PCT: 1 % (ref 0–5)
Eosinophils Absolute: 0.1 10*3/uL (ref 0.0–0.7)
HEMATOCRIT: 37.4 % (ref 36.0–46.0)
HEMOGLOBIN: 12.9 g/dL (ref 12.0–15.0)
LYMPHS ABS: 1.6 10*3/uL (ref 0.7–4.0)
LYMPHS PCT: 23 % (ref 12–46)
MCH: 32.8 pg (ref 26.0–34.0)
MCHC: 34.5 g/dL (ref 30.0–36.0)
MCV: 95.2 fL (ref 78.0–100.0)
MONO ABS: 0.6 10*3/uL (ref 0.1–1.0)
Monocytes Relative: 8 % (ref 3–12)
Neutro Abs: 4.8 10*3/uL (ref 1.7–7.7)
Neutrophils Relative %: 68 % (ref 43–77)
PLATELETS: 192 10*3/uL (ref 150–400)
RBC: 3.93 MIL/uL (ref 3.87–5.11)
RDW: 13.6 % (ref 11.5–15.5)
WBC: 7 10*3/uL (ref 4.0–10.5)

## 2013-09-17 LAB — URINALYSIS, ROUTINE W REFLEX MICROSCOPIC
Bilirubin Urine: NEGATIVE
Glucose, UA: NEGATIVE mg/dL
KETONES UR: NEGATIVE mg/dL
LEUKOCYTES UA: NEGATIVE
Nitrite: NEGATIVE
PH: 5.5 (ref 5.0–8.0)
Protein, ur: NEGATIVE mg/dL
Specific Gravity, Urine: 1.015 (ref 1.005–1.030)
Urobilinogen, UA: 0.2 mg/dL (ref 0.0–1.0)

## 2013-09-17 LAB — COMPREHENSIVE METABOLIC PANEL
ALK PHOS: 62 U/L (ref 39–117)
ALT: 6 U/L (ref 0–35)
AST: 13 U/L (ref 0–37)
Albumin: 3.8 g/dL (ref 3.5–5.2)
BILIRUBIN TOTAL: 1 mg/dL (ref 0.3–1.2)
BUN: 15 mg/dL (ref 6–23)
CHLORIDE: 104 meq/L (ref 96–112)
CO2: 27 meq/L (ref 19–32)
CREATININE: 0.94 mg/dL (ref 0.50–1.10)
Calcium: 9.1 mg/dL (ref 8.4–10.5)
GFR calc Af Amer: 65 mL/min — ABNORMAL LOW (ref >90)
GFR, EST NON AFRICAN AMERICAN: 56 mL/min — AB (ref >90)
Glucose, Bld: 101 mg/dL — ABNORMAL HIGH (ref 70–99)
Potassium: 3.8 mEq/L (ref 3.7–5.3)
Sodium: 141 mEq/L (ref 137–147)
Total Protein: 6.6 g/dL (ref 6.0–8.3)

## 2013-09-17 LAB — URINE MICROSCOPIC-ADD ON

## 2013-09-17 MED ORDER — ONDANSETRON HCL 8 MG PO TABS: 8.0000 mg | ORAL_TABLET | ORAL | Status: DC | PRN

## 2013-09-17 NOTE — Discharge Instructions (Signed)
Tests were good.. Increase fluids. Antibiotic. Followup your primary care Dr.

## 2013-09-17 NOTE — ED Notes (Signed)
Reports generalized weakness, cough, and hoarseness x 1 week with no improvement.  Has seen family MD for same and Rx doxycycline.  States cannot take this med due to "makes me sick".

## 2013-09-17 NOTE — ED Provider Notes (Signed)
CSN: [id number]     Arrival date & time 09/17/13  4098 History  This chart was scribed for Donnetta Hutching, MD by Joaquin Music, ED Scribe. This patient was seen in room [facility] and the patient's care was started at 7:33 AM.   Chief Complaint  Patient presents with  . Fatigue   The history is provided by the patient. No language interpreter was used.   HPI Comments: [name] is a 80 y.o. female who presents to the Emergency Department complaining of increased weakness, ongoing cough and phlegm producation that began approximately 2 weeks ago. She states she "feels something is in her throat and is unable to cough up sputum." Pt states she has been able to walk around her home and complete her normal activities without any complications. Pts son states she was initially seen by another provider whom prescribed her Doxycycline for uncertain reasons. Pt states she was recently prescribed a vaginal ointment by her urologist due to a UTI she states she was being treated for. Pt states her PCP is Dr. Lars Mage. Pt denies fever and SOB.  Past Medical History  Diagnosis Date  . Coronary atherosclerosis of native coronary artery     DES LAD and BMS LAD 8/08  . Mixed hyperlipidemia   . GERD (gastroesophageal reflux disease)   . Essential hypertension, benign   . Anxiety   . Resting tremor   . COPD (chronic obstructive pulmonary disease)   . Rickets   . MI (myocardial infarction) 2008   Past Surgical History  Procedure Laterality Date  . Appendectomy    . Cholecystectomy    . Bilateral cataract surgery    . Vein ligation and stripping    . Cardiac catheterization    . Coronary angioplasty with stent placement    . Left trochanteric bursa injection  11/27/11    DR. O'TOOLE  . Left sacroiliac joint injection  11/27/11    DR. O'TOOLE  . Yag laser application  09/06/2012    Procedure: YAG LASER APPLICATION;  Surgeon: Susa Simmonds, MD;  Location: [facility];  Service: Ophthalmology;   Laterality: Left;   Family History  Problem Relation Age of Onset  . Hypertension     History  Substance Use Topics  . Smoking status: Never Smoker   . Smokeless tobacco: Never Used  . Alcohol Use: No   OB History   Grav Para Term Preterm Abortions TAB SAB Ect Mult Living   4 4 4       4      Review of Systems A complete 10 system review of systems was obtained and all systems are negative except as noted in the HPI and PMH.   Allergies  Azithromycin; Cephalexin; Ciprofloxacin; Codeine; Contrast media; Latex; Lorazepam; Nitrofuran derivatives; Nsaids; Nutritional supplements; Penicillins; Phenazopyridine; Quinolones; Sulfa antibiotics; Toviaz; and Trimethoprim  Home Medications   Current Outpatient Rx  Name  Route  Sig  Dispense  Refill  . acetaminophen (TYLENOL) 500 MG tablet   Oral   Take 500 mg by mouth every 6 (six) hours as needed. pain         . atorvastatin (LIPITOR) 20 MG tablet      TAKE 1 TABLET BY MOUTH AT BEDTIME - EMERGENCY REFILL FAXED DR   30 tablet   0     Needs to be seen. Past due for labwork and visit.  ...   . calcium citrate-vitamin D (CITRACAL+D) 315-200 MG-UNIT per tablet   Oral   Take  1 tablet by mouth daily.         . cholecalciferol (VITAMIN D) 1000 UNITS tablet   Oral   Take 4,000 Units by mouth daily.         . clopidogrel (PLAVIX) 75 MG tablet   Oral   Take 1 tablet (75 mg total) by mouth daily.   30 tablet   6     Generic ZOX:WRUEAVFor:PLAVIX    75MG  (TAB)  01/28/2012 9:54: ...   . doxycycline (VIBRA-TABS) 100 MG tablet   Oral   Take 1 tablet (100 mg total) by mouth 2 (two) times daily.   20 tablet   0   . ESTRACE VAGINAL 0.1 MG/GM vaginal cream   Vaginal   Place 1 Applicatorful vaginally 3 (three) times a week.          . isosorbide mononitrate (IMDUR) 30 MG 24 hr tablet   Oral   Take 1 tablet (30 mg total) by mouth 2 (two) times daily.   60 tablet   6     Dose increase   . lisinopril (PRINIVIL,ZESTRIL) 10 MG  tablet      TAKE 1 TABLET BY MOUTH EVERY DAY   30 tablet   2     Generic WUJ:WJXBJYNFor:ZESTRIL   10MG    . LORazepam (ATIVAN) 0.5 MG tablet   Oral   Take 1 tablet (0.5 mg total) by mouth 2 (two) times daily as needed for anxiety.   60 tablet   0   . nitroGLYCERIN (NITROSTAT) 0.4 MG SL tablet   Sublingual   Place 1 tablet (0.4 mg total) under the tongue every 5 (five) minutes as needed.   25 tablet   3   . pantoprazole (PROTONIX) 40 MG tablet   Oral   Take 1 tablet (40 mg total) by mouth daily at 12 noon.   30 tablet   6    Triage Vitals:BP 158/79  Pulse 58  Temp(Src) 98 F (36.7 C) (Oral)  Resp 20  Wt 140 lb (63.504 kg)  SpO2 94%  Physical Exam  Nursing note and vitals reviewed. Constitutional: She is oriented to person, place, and time. She appears well-developed and well-nourished.  HENT:  Head: Normocephalic and atraumatic.  Eyes: Conjunctivae and EOM are normal. Pupils are equal, round, and reactive to light.  Neck: Normal range of motion. Neck supple.  Cardiovascular: Normal rate, regular rhythm and normal heart sounds.   Pulmonary/Chest: Effort normal and breath sounds normal.  Abdominal: Soft. Bowel sounds are normal.  Musculoskeletal: Normal range of motion.  Neurological: She is alert and oriented to person, place, and time.  Skin: Skin is warm and dry.  Psychiatric: She has a normal mood and affect. Her behavior is normal.    ED Course  Procedures  DIAGNOSTIC STUDIES: Oxygen Saturation is 94% on RA, normal by my interpretation.    COORDINATION OF CARE: 7:37 AM-Discussed treatment plan which includes UA, CXR, and labs. Pt agreed to plan.   Labs Review Labs Reviewed  URINALYSIS, ROUTINE W REFLEX MICROSCOPIC - Abnormal; Notable for the following:    APPearance HAZY (*)    Hgb urine dipstick TRACE (*)    All other components within normal limits  COMPREHENSIVE METABOLIC PANEL - Abnormal; Notable for the following:    Glucose, Bld 101 (*)    GFR calc non  Af Amer 56 (*)    GFR calc Af Amer 65 (*)    All other components within normal limits  URINE MICROSCOPIC-ADD  ON - Abnormal; Notable for the following:    Squamous Epithelial / LPF MANY (*)    Bacteria, UA FEW (*)    All other components within normal limits  URINE CULTURE  CBC WITH DIFFERENTIAL   Imaging Review Dg Chest 2 View  09/17/2013   CLINICAL DATA:  Cough and weakness  EXAM: CHEST  2 VIEW  COMPARISON:  03/19/2013  FINDINGS: Heart size and vascular pattern normal. Lungs clear. Aortic calcification. Stable moderate compression deformity at the thoracolumbar junction.  IMPRESSION: No acute findings   Electronically Signed   By: Esperanza Heir M.D.   On: 09/17/2013 08:23    EKG Interpretation   None      MDM   1. Weakness    Patient is in no acute distress. Screening labs, chest x-ray, urinalysis all normal. Patient has primary care followup   I personally performed the services described in this documentation, which was scribed in my presence. The recorded information has been reviewed and is accurate.    Donnetta Hutching, MD 09/17/13 1038

## 2013-09-20 LAB — URINE CULTURE

## 2013-09-21 NOTE — ED Notes (Signed)
+   urine No treatment necessary per Santiago GladHeather Laisure

## 2013-09-23 ENCOUNTER — Ambulatory Visit (INDEPENDENT_AMBULATORY_CARE_PROVIDER_SITE_OTHER): Payer: Medicare HMO | Admitting: Urology

## 2013-09-23 DIAGNOSIS — N3946 Mixed incontinence: Secondary | ICD-10-CM

## 2013-09-23 DIAGNOSIS — N952 Postmenopausal atrophic vaginitis: Secondary | ICD-10-CM

## 2013-09-26 ENCOUNTER — Other Ambulatory Visit: Payer: Self-pay | Admitting: *Deleted

## 2013-09-26 DIAGNOSIS — R251 Tremor, unspecified: Secondary | ICD-10-CM

## 2013-09-26 MED ORDER — ATORVASTATIN CALCIUM 20 MG PO TABS: 20.0000 mg | ORAL_TABLET | Freq: Every day | ORAL | Status: DC

## 2013-09-26 MED ORDER — LORAZEPAM 0.5 MG PO TABS: 0.5000 mg | ORAL_TABLET | Freq: Two times a day (BID) | ORAL | Status: DC | PRN

## 2013-09-26 NOTE — Telephone Encounter (Signed)
No lipids since 10/13, if lorazepam is approved, Route to pool a to be called into [facility] [phone]

## 2013-10-04 ENCOUNTER — Other Ambulatory Visit: Payer: Self-pay | Admitting: Family Medicine

## 2013-10-04 NOTE — Telephone Encounter (Signed)
Rx called into [facility] 

## 2013-10-13 ENCOUNTER — Telehealth: Payer: Self-pay | Admitting: Family Medicine

## 2013-10-13 ENCOUNTER — Encounter: Payer: Self-pay | Admitting: Family Medicine

## 2013-10-13 ENCOUNTER — Ambulatory Visit (INDEPENDENT_AMBULATORY_CARE_PROVIDER_SITE_OTHER): Payer: Medicare HMO | Admitting: Family Medicine

## 2013-10-13 VITALS — BP 133/64 | HR 75 | Temp 98.6°F | Ht 59.0 in | Wt 148.0 lb

## 2013-10-13 DIAGNOSIS — B9789 Other viral agents as the cause of diseases classified elsewhere: Secondary | ICD-10-CM | POA: Insufficient documentation

## 2013-10-13 DIAGNOSIS — IMO0001 Reserved for inherently not codable concepts without codable children: Secondary | ICD-10-CM

## 2013-10-13 DIAGNOSIS — R059 Cough, unspecified: Secondary | ICD-10-CM

## 2013-10-13 DIAGNOSIS — J988 Other specified respiratory disorders: Secondary | ICD-10-CM

## 2013-10-13 DIAGNOSIS — R05 Cough: Secondary | ICD-10-CM

## 2013-10-13 LAB — POCT INFLUENZA A/B
Influenza A, POC: NEGATIVE
Influenza B, POC: NEGATIVE

## 2013-10-13 MED ORDER — OSELTAMIVIR PHOSPHATE 75 MG PO CAPS: 75.0000 mg | ORAL_CAPSULE | Freq: Two times a day (BID) | ORAL | Status: DC

## 2013-10-13 NOTE — Telephone Encounter (Signed)
appt at 2:40 with wong

## 2013-10-13 NOTE — Progress Notes (Signed)
Patient ID: [name], female   DOB: [birth year], 80 y.o.   MRN: [id number] SUBJECTIVE: CC: Chief Complaint  Patient presents with  . Acute Visit    thnks may have flu    HPI: Weak hoarse. Warm and chilly. Had for 2 days and phlegm. Achey.  Past Medical History  Diagnosis Date  . Coronary atherosclerosis of native coronary artery     DES LAD and BMS LAD 8/08  . Mixed hyperlipidemia   . GERD (gastroesophageal reflux disease)   . Essential hypertension, benign   . Anxiety   . Resting tremor   . COPD (chronic obstructive pulmonary disease)   . Rickets   . MI (myocardial infarction) 2008   Past Surgical History  Procedure Laterality Date  . Appendectomy    . Cholecystectomy    . Bilateral cataract surgery    . Vein ligation and stripping    . Cardiac catheterization    . Coronary angioplasty with stent placement    . Left trochanteric bursa injection  11/27/11    DR. O'TOOLE  . Left sacroiliac joint injection  11/27/11    DR. O'TOOLE  . Yag laser application  09/06/2012    Procedure: YAG LASER APPLICATION;  Surgeon: Susa Simmonds, MD;  Location: [facility];  Service: Ophthalmology;  Laterality: Left;   History   Social History  . Marital Status: Widowed    Spouse Name: N/A    Number of Children: N/A  . Years of Education: N/A   Occupational History  . Not on file.   Social History Main Topics  . Smoking status: Never Smoker   . Smokeless tobacco: Never Used  . Alcohol Use: No  . Drug Use: No  . Sexual Activity: No   Other Topics Concern  . Not on file   Social History Narrative  . No narrative on file   Family History  Problem Relation Age of Onset  . Hypertension     Current Outpatient Prescriptions on File Prior to Visit  Medication Sig Dispense Refill  . acetaminophen (TYLENOL) 500 MG tablet Take 500 mg by mouth every 6 (six) hours as needed. pain      . atorvastatin (LIPITOR) 20 MG tablet TAKE 1 TABLET BY MOUTH AT BEDTIME  30 tablet  0  .  calcium citrate-vitamin D (CITRACAL+D) 315-200 MG-UNIT per tablet Take 1 tablet by mouth daily.      . cholecalciferol (VITAMIN D) 1000 UNITS tablet Take 4,000 Units by mouth daily.      . clopidogrel (PLAVIX) 75 MG tablet Take 1 tablet (75 mg total) by mouth daily.  30 tablet  6  . ESTRACE VAGINAL 0.1 MG/GM vaginal cream Place 1 Applicatorful vaginally 3 (three) times a week.       . isosorbide mononitrate (IMDUR) 30 MG 24 hr tablet Take 1 tablet (30 mg total) by mouth 2 (two) times daily.  60 tablet  6  . lisinopril (PRINIVIL,ZESTRIL) 10 MG tablet TAKE 1 TABLET BY MOUTH EVERY DAY  30 tablet  2  . LORazepam (ATIVAN) 0.5 MG tablet Take 1 tablet (0.5 mg total) by mouth 2 (two) times daily as needed for anxiety.  60 tablet  0  . nitroGLYCERIN (NITROSTAT) 0.4 MG SL tablet Place 1 tablet (0.4 mg total) under the tongue every 5 (five) minutes as needed.  25 tablet  3  . pantoprazole (PROTONIX) 40 MG tablet Take 1 tablet (40 mg total) by mouth daily at 12 noon.  30 tablet  6  . doxycycline (VIBRA-TABS) 100 MG tablet Take 1 tablet (100 mg total) by mouth 2 (two) times daily.  20 tablet  0  . ondansetron (ZOFRAN) 8 MG tablet Take 1 tablet (8 mg total) by mouth every 4 (four) hours as needed for nausea or vomiting.  10 tablet  0   No current facility-administered medications on file prior to visit.   Allergies  Allergen Reactions  . Azithromycin   . Cephalexin     Causes her to shake  . Ciprofloxacin   . Codeine     REACTION: shakiness  . Contrast Media [Iodinated Diagnostic Agents]   . Latex   . Lorazepam Other (See Comments)    Patient states she can not take  . Nitrofuran Derivatives   . Nsaids   . Nutritional Supplements   . Penicillins     REACTION: shakiness  . Phenazopyridine   . Quinolones   . Sulfa Antibiotics   . Toviaz [Fesoterodine Fumarate Er]   . Trimethoprim    Immunization History  Administered Date(s) Administered  . Influenza Whole 06/07/1996  . Pneumococcal  Polysaccharide-23 09/02/1995, 09/01/2000  . Pneumococcal-Unspecified 09/01/2010  . Td 12/30/1996  . Tdap 06/01/2012  . Zoster 06/01/2012   Prior to Admission medications   Medication Sig Start Date End Date Taking? Authorizing Provider  acetaminophen (TYLENOL) 500 MG tablet Take 500 mg by mouth every 6 (six) hours as needed. pain   Yes Historical Provider, MD  atorvastatin (LIPITOR) 20 MG tablet TAKE 1 TABLET BY MOUTH AT BEDTIME 10/04/13  Yes Deatra CanterWilliam J Oxford, FNP  calcium citrate-vitamin D (CITRACAL+D) 315-200 MG-UNIT per tablet Take 1 tablet by mouth daily.   Yes Historical Provider, MD  cholecalciferol (VITAMIN D) 1000 UNITS tablet Take 4,000 Units by mouth daily.   Yes Historical Provider, MD  clopidogrel (PLAVIX) 75 MG tablet Take 1 tablet (75 mg total) by mouth daily. 03/24/13  Yes Jonelle SidleSamuel G McDowell, MD  doxycycline (MONODOX) 100 MG capsule  09/09/13  Yes Historical Provider, MD  ESTRACE VAGINAL 0.1 MG/GM vaginal cream Place 1 Applicatorful vaginally 3 (three) times a week.  07/22/13  Yes Historical Provider, MD  isosorbide mononitrate (IMDUR) 30 MG 24 hr tablet Take 1 tablet (30 mg total) by mouth 2 (two) times daily. 09/05/13  Yes Jonelle SidleSamuel G McDowell, MD  lisinopril (PRINIVIL,ZESTRIL) 10 MG tablet TAKE 1 TABLET BY MOUTH EVERY DAY 06/23/13  Yes Jonelle SidleSamuel G McDowell, MD  LORazepam (ATIVAN) 0.5 MG tablet Take 1 tablet (0.5 mg total) by mouth 2 (two) times daily as needed for anxiety. 09/26/13  Yes Deatra CanterWilliam J Oxford, FNP  nitroGLYCERIN (NITROSTAT) 0.4 MG SL tablet Place 1 tablet (0.4 mg total) under the tongue every 5 (five) minutes as needed. 09/10/12  Yes Jonelle SidleSamuel G McDowell, MD  pantoprazole (PROTONIX) 40 MG tablet Take 1 tablet (40 mg total) by mouth daily at 12 noon. 04/25/13  Yes Jonelle SidleSamuel G McDowell, MD  doxycycline (VIBRA-TABS) 100 MG tablet Take 1 tablet (100 mg total) by mouth 2 (two) times daily. 09/09/13   Deatra CanterWilliam J Oxford, FNP  ondansetron (ZOFRAN) 8 MG tablet Take 1 tablet (8 mg total) by mouth every  4 (four) hours as needed for nausea or vomiting. 09/17/13   Donnetta HutchingBrian Cook, MD     ROS: As above in the HPI. All other systems are stable or negative.  OBJECTIVE: APPEARANCE:  Patient in no acute distress.The patient appeared well nourished and normally developed. Acyanotic. Waist: VITAL SIGNS:BP 133/64  Pulse 75  Temp(Src) 98.6  F (37 C) (Oral)  Ht 4\' 11"  (1.499 m)  Wt 148 lb (67.132 kg)  BMI 29.88 kg/m2  Coughing hacky. WF  SKIN: warm and  Dry without overt rashes, tattoos and scars  HEAD and Neck: without JVD, Head and scalp: normal Eyes:No scleral icterus. Fundi normal, eye movements normal. Ears: Auricle normal, canal normal, Tympanic membranes normal, insufflation normal. Nose: nasal congested Throat: normal Neck & thyroid: normal  CHEST & LUNGS: Chest wall: normal Lungs: Clear  CVS: Reveals the PMI to be normally located. Regular rhythm, First and Second Heart sounds are normal,  absence of murmurs, rubs or gallops. Peripheral vasculature: Radial pulses: normal Dorsal pedis pulses: normal Posterior pulses: normal  ABDOMEN:  Appearance: normal Benign, no organomegaly, no masses, no Abdominal Aortic enlargement. No Guarding , no rebound. No Bruits. Bowel sounds: normal  RECTAL: N/A GU: N/A  EXTREMETIES: nonedematous.  MUSCULOSKELETAL:  Spine: normal Joints: intact  NEUROLOGIC: oriented to time,place and person; nonfocal.   ASSESSMENT: Cough - Plan: Influenza A/B, oseltamivir (TAMIFLU) 75 MG capsule  Viral respiratory illness - Plan: oseltamivir (TAMIFLU) 75 MG capsule  PLAN: Hand washing/hygiene.  Orders Placed This Encounter  Procedures  . Influenza A/B   Results for orders placed in visit on 10/13/13  POCT INFLUENZA A/B      Result Value Ref Range   Influenza A, POC Negative     Influenza B, POC Negative      Meds ordered this encounter  Medications  . doxycycline (MONODOX) 100 MG capsule    Sig:   . oseltamivir (TAMIFLU) 75 MG  capsule    Sig: Take 1 capsule (75 mg total) by mouth 2 (two) times daily.    Dispense:  10 capsule    Refill:  0   There are no discontinued medications. Return if symptoms worsen or fail to improve.  Aston Lieske P. Modesto Charon, M.D.

## 2013-10-19 ENCOUNTER — Other Ambulatory Visit: Payer: Self-pay | Admitting: Cardiology

## 2013-10-19 MED ORDER — CLOPIDOGREL BISULFATE 75 MG PO TABS: ORAL_TABLET | ORAL | Status: DC

## 2013-10-19 MED ORDER — LISINOPRIL 10 MG PO TABS: ORAL_TABLET | ORAL | Status: DC

## 2013-10-28 ENCOUNTER — Ambulatory Visit: Payer: Medicare HMO | Admitting: Urology

## 2013-11-09 ENCOUNTER — Ambulatory Visit (INDEPENDENT_AMBULATORY_CARE_PROVIDER_SITE_OTHER): Payer: Medicare HMO | Admitting: Family Medicine

## 2013-11-09 ENCOUNTER — Telehealth: Payer: Self-pay | Admitting: Family Medicine

## 2013-11-09 ENCOUNTER — Encounter: Payer: Self-pay | Admitting: Family Medicine

## 2013-11-09 VITALS — BP 134/71 | HR 67 | Temp 97.7°F | Ht 60.0 in | Wt 147.8 lb

## 2013-11-09 DIAGNOSIS — R5381 Other malaise: Secondary | ICD-10-CM

## 2013-11-09 DIAGNOSIS — R5383 Other fatigue: Secondary | ICD-10-CM

## 2013-11-09 DIAGNOSIS — R531 Weakness: Secondary | ICD-10-CM

## 2013-11-09 LAB — POCT URINALYSIS DIPSTICK
Bilirubin, UA: NEGATIVE
Glucose, UA: NEGATIVE
Ketones, UA: NEGATIVE
Leukocytes, UA: NEGATIVE
Nitrite, UA: NEGATIVE
Spec Grav, UA: 1.02
Urobilinogen, UA: NEGATIVE
pH, UA: 5

## 2013-11-09 LAB — POCT CBC
Granulocyte percent: 64.2 %G (ref 37–80)
HCT, POC: 37.6 % — AB (ref 37.7–47.9)
Hemoglobin: 12.2 g/dL (ref 12.2–16.2)
Lymph, poc: 1.9 (ref 0.6–3.4)
MCH, POC: 30.2 pg (ref 27–31.2)
MCHC: 32.6 g/dL (ref 31.8–35.4)
MCV: 92.6 fL (ref 80–97)
MPV: 7.2 fL (ref 0–99.8)
POC Granulocyte: 4.2 (ref 2–6.9)
POC LYMPH PERCENT: 28.4 %L (ref 10–50)
Platelet Count, POC: 234 10*3/uL (ref 142–424)
RBC: 4.1 M/uL (ref 4.04–5.48)
RDW, POC: 14 %
WBC: 6.6 10*3/uL (ref 4.6–10.2)

## 2013-11-09 LAB — POCT UA - MICROSCOPIC ONLY
Casts, Ur, LPF, POC: NEGATIVE
Crystals, Ur, HPF, POC: NEGATIVE
Mucus, UA: NEGATIVE
Yeast, UA: NEGATIVE

## 2013-11-09 MED ORDER — ATORVASTATIN CALCIUM 20 MG PO TABS: 20.0000 mg | ORAL_TABLET | Freq: Every day | ORAL | Status: DC

## 2013-11-09 NOTE — Telephone Encounter (Signed)
appt made

## 2013-11-09 NOTE — Progress Notes (Signed)
   Subjective:    Patient ID: [name], female    DOB: 12/07/1930, 80 y.o.   MRN: [id number]  HPI This 80 y.o. female presents for evaluation of fatigue and feeling tired.   Review of Systems No chest pain, SOB, HA, dizziness, vision change, N/V, diarrhea, constipation, dysuria, urinary urgency or frequency, myalgias, arthralgias or rash.     Objective:   Physical Exam  Vital signs noted  Well developed well nourished female.  HEENT - Head atraumatic Normocephalic                Eyes - PERRLA, Conjuctiva - clear Sclera- Clear EOMI                Ears - EAC's Wnl TM's Wnl Gross Hearing WNL                Throat - oropharanx wnl Respiratory - Lungs CTA bilateral Cardiac - RRR S1 and S2 without murmur GI - Abdomen soft Nontender and bowel sounds active x 4 Extremities - No edema. Neuro - Grossly intact.  Results for orders placed in visit on 11/09/13  POCT CBC      Result Value Ref Range   WBC 6.6  4.6 - 10.2 K/uL   Lymph, poc 1.9  0.6 - 3.4   POC LYMPH PERCENT 28.4  10 - 50 %L   POC Granulocyte 4.2  2 - 6.9   Granulocyte percent 64.2  37 - 80 %G   RBC 4.1  4.04 - 5.48 M/uL   Hemoglobin 12.2  12.2 - 16.2 g/dL   HCT, POC 37.6 (*) 37.7 - 47.9 %   MCV 92.6  80 - 97 fL   MCH, POC 30.2  27 - 31.2 pg   MCHC 32.6  31.8 - 35.4 g/dL   RDW, POC 14.0     Platelet Count, POC 234.0  142 - 424 K/uL   MPV 7.2  0 - 99.8 fL  POCT UA - MICROSCOPIC ONLY      Result Value Ref Range   WBC, Ur, HPF, POC 5-8     RBC, urine, microscopic 1-5     Bacteria, U Microscopic moderate     Mucus, UA negative     Epithelial cells, urine per micros few     Crystals, Ur, HPF, POC negative     Casts, Ur, LPF, POC negative     Yeast, UA negative    POCT URINALYSIS DIPSTICK      Result Value Ref Range   Color, UA gold     Clarity, UA clear     Glucose, UA negative     Bilirubin, UA negative     Ketones, UA negative     Spec Grav, UA 1.020     Blood, UA large     pH, UA 5.0     Protein, UA trace     Urobilinogen, UA negative     Nitrite, UA negative     Leukocytes, UA Negative        Assessment & Plan:  Fatigue - Plan: POCT CBC, CMP14+EGFR, Thyroid Panel With TSH, Vit D  25 hydroxy (rtn osteoporosis monitoring), Vitamin B12, POCT UA - Microscopic Only, POCT urinalysis dipstick  Weakness - Plan: POCT CBC, CMP14+EGFR, Thyroid Panel With TSH, Vit D  25 hydroxy (rtn osteoporosis monitoring), Vitamin B12, POCT UA - Microscopic Only, POCT urinalysis dipstick  Lysbeth Penner FNP

## 2013-11-10 ENCOUNTER — Ambulatory Visit: Payer: Medicare HMO | Admitting: Family Medicine

## 2013-11-10 ENCOUNTER — Other Ambulatory Visit: Payer: Self-pay | Admitting: Family Medicine

## 2013-11-10 ENCOUNTER — Telehealth: Payer: Self-pay | Admitting: Family Medicine

## 2013-11-10 LAB — CMP14+EGFR
ALT: 6 IU/L (ref 0–32)
AST: 13 IU/L (ref 0–40)
Albumin/Globulin Ratio: 1.7 (ref 1.1–2.5)
Albumin: 4 g/dL (ref 3.5–4.7)
Alkaline Phosphatase: 74 IU/L (ref 39–117)
BUN/Creatinine Ratio: 18 (ref 11–26)
BUN: 16 mg/dL (ref 8–27)
CO2: 23 mmol/L (ref 18–29)
Calcium: 9.3 mg/dL (ref 8.7–10.3)
Chloride: 104 mmol/L (ref 97–108)
Creatinine, Ser: 0.87 mg/dL (ref 0.57–1.00)
GFR calc Af Amer: 72 mL/min/{1.73_m2} (ref >59)
GFR calc non Af Amer: 63 mL/min/{1.73_m2} (ref >59)
Globulin, Total: 2.4 g/dL (ref 1.5–4.5)
Glucose: 103 mg/dL — ABNORMAL HIGH (ref 65–99)
Potassium: 4.6 mmol/L (ref 3.5–5.2)
Sodium: 144 mmol/L (ref 134–144)
Total Bilirubin: 0.6 mg/dL (ref 0.0–1.2)
Total Protein: 6.4 g/dL (ref 6.0–8.5)

## 2013-11-10 LAB — VITAMIN D 25 HYDROXY (VIT D DEFICIENCY, FRACTURES): Vit D, 25-Hydroxy: 26 ng/mL — ABNORMAL LOW (ref 30.0–100.0)

## 2013-11-10 LAB — THYROID PANEL WITH TSH
Free Thyroxine Index: 1.8 (ref 1.2–4.9)
T3 Uptake Ratio: 25 % (ref 24–39)
T4, Total: 7.1 ug/dL (ref 4.5–12.0)
TSH: 2.28 u[IU]/mL (ref 0.450–4.500)

## 2013-11-10 LAB — VITAMIN B12: Vitamin B-12: 424 pg/mL (ref 211–946)

## 2013-11-10 MED ORDER — VITAMIN D (ERGOCALCIFEROL) 1.25 MG (50000 UNIT) PO CAPS: 50000.0000 [IU] | ORAL_CAPSULE | ORAL | Status: DC

## 2013-11-10 NOTE — Telephone Encounter (Signed)
Pt notified urine culture not back yet Verbalizes understanding

## 2013-11-14 ENCOUNTER — Telehealth: Payer: Self-pay | Admitting: Family Medicine

## 2013-11-14 NOTE — Telephone Encounter (Signed)
Patient wanted to verify that she was taking the correct dose of Vit D.

## 2013-12-12 ENCOUNTER — Other Ambulatory Visit: Payer: Self-pay | Admitting: Cardiology

## 2014-01-06 ENCOUNTER — Ambulatory Visit: Payer: Medicare HMO | Admitting: Urology

## 2014-01-09 ENCOUNTER — Telehealth: Payer: Self-pay | Admitting: Family Medicine

## 2014-01-09 NOTE — Telephone Encounter (Signed)
appt scheduled and i called for records from er

## 2014-01-10 ENCOUNTER — Encounter: Payer: Self-pay | Admitting: Family Medicine

## 2014-01-10 ENCOUNTER — Ambulatory Visit (INDEPENDENT_AMBULATORY_CARE_PROVIDER_SITE_OTHER): Payer: Medicare HMO | Admitting: Family Medicine

## 2014-01-10 VITALS — BP 96/60 | HR 77 | Temp 97.3°F | Ht 60.0 in | Wt 145.0 lb

## 2014-01-10 DIAGNOSIS — J449 Chronic obstructive pulmonary disease, unspecified: Secondary | ICD-10-CM

## 2014-01-10 DIAGNOSIS — J441 Chronic obstructive pulmonary disease with (acute) exacerbation: Secondary | ICD-10-CM

## 2014-01-10 MED ORDER — PREDNISONE 10 MG PO TABS: ORAL_TABLET | ORAL | Status: DC

## 2014-01-10 MED ORDER — DOXYCYCLINE HYCLATE 100 MG PO TABS: 100.0000 mg | ORAL_TABLET | Freq: Two times a day (BID) | ORAL | Status: DC

## 2014-01-10 MED ORDER — LEVALBUTEROL HCL 1.25 MG/3ML IN NEBU: 1.2500 mg | INHALATION_SOLUTION | Freq: Once | RESPIRATORY_TRACT | Status: DC

## 2014-01-10 MED ORDER — LEVALBUTEROL HCL 1.25 MG/3ML IN NEBU
1.2500 mg | INHALATION_SOLUTION | Freq: Once | RESPIRATORY_TRACT | Status: AC
  Administered 2014-01-10: 1.25 mg via RESPIRATORY_TRACT

## 2014-01-10 MED ORDER — ALBUTEROL SULFATE (2.5 MG/3ML) 0.083% IN NEBU: 2.5000 mg | INHALATION_SOLUTION | Freq: Four times a day (QID) | RESPIRATORY_TRACT | Status: DC | PRN

## 2014-01-10 MED ORDER — LEVALBUTEROL HCL 1.25 MG/0.5ML IN NEBU: 1.2500 mg | INHALATION_SOLUTION | Freq: Once | RESPIRATORY_TRACT | Status: DC

## 2014-01-10 NOTE — Progress Notes (Signed)
   Subjective:    Patient ID: [name], female    DOB: March 08, 1931, 80 y.o.   MRN: [id number]  HPI This 80 y.o. female presents for evaluation of cough and bronchitis sx's.  She has been seen in the ED recently and rx'd zpak and she states it didn't help.   Review of Systems C/o cough, SOB, and uri sx's No chest pain,  HA, dizziness, vision change, N/V, diarrhea, constipation, dysuria, urinary urgency or frequency, myalgias, arthralgias or rash.     Objective:   Physical Exam Vital signs noted  Chronically ill appearing female.  HEENT - Head atraumatic Normocephalic                Eyes - PERRLA, Conjuctiva - clear Sclera- Clear EOMI                Ears - EAC's Wnl TM's Wnl Gross Hearing WNL                Throat - oropharanx wnl Respiratory - Lungs with expiratory wheezes throughout Cardiac - RRR S1 and S2 without murmur GI - Abdomen soft Nontender and bowel sounds active x 4 Extremities - No edema. Neuro - Grossly intact.       Assessment & Plan:  COPD exacerbation - Plan: levalbuterol (XOPENEX) nebulizer solution 1.25 mg  COPD (chronic obstructive pulmonary disease) Nebulizer and supplies etc rx'd  Push po fluids, rest, tylenol and motrin otc prn as directed for fever, arthralgias, and myalgias.  Follow up prn if sx's continue or persist.  Deatra CanterWilliam J Oxford FNP

## 2014-01-10 NOTE — Addendum Note (Signed)
Addended by: Bearl MulberryUTHERFORD, Rony Ratz K on: 01/10/2014 07:00 PM   Modules accepted: Orders

## 2014-01-25 ENCOUNTER — Telehealth: Payer: Self-pay | Admitting: Family Medicine

## 2014-01-25 ENCOUNTER — Ambulatory Visit (INDEPENDENT_AMBULATORY_CARE_PROVIDER_SITE_OTHER): Payer: Commercial Managed Care - HMO | Admitting: Family Medicine

## 2014-01-25 ENCOUNTER — Encounter: Payer: Self-pay | Admitting: Family Medicine

## 2014-01-25 VITALS — BP 133/77 | HR 68 | Temp 98.6°F | Ht 60.0 in | Wt 145.2 lb

## 2014-01-25 DIAGNOSIS — R509 Fever, unspecified: Secondary | ICD-10-CM

## 2014-01-25 DIAGNOSIS — J029 Acute pharyngitis, unspecified: Secondary | ICD-10-CM

## 2014-01-25 LAB — POCT RAPID STREP A (OFFICE): Rapid Strep A Screen: NEGATIVE

## 2014-01-25 MED ORDER — DOXYCYCLINE HYCLATE 100 MG PO TABS: 100.0000 mg | ORAL_TABLET | Freq: Two times a day (BID) | ORAL | Status: DC

## 2014-01-25 NOTE — Telephone Encounter (Signed)
appt given for 5:30 

## 2014-01-25 NOTE — Progress Notes (Signed)
   Subjective:    Patient ID: [name], female    DOB: February 21, 1931, 80 y.o.   MRN: [id number]  HPI This 80 y.o. female presents for evaluation of sore throat and uri sx's.   Review of Systems No chest pain, SOB, HA, dizziness, vision change, N/V, diarrhea, constipation, dysuria, urinary urgency or frequency, myalgias, arthralgias or rash.     Objective:   Physical Exam Vital signs noted  Well developed well nourished elderly female.  HEENT - Head atraumatic Normocephalic                Eyes - PERRLA, Conjuctiva - clear Sclera- Clear EOMI                Ears - EAC's Wnl TM's Wnl Gross Hearing WNL                Nose - Nares patent                 Throat - oropharanx wnl Respiratory - Lungs CTA bilateral Cardiac - RRR S1 and S2 without murmur GI - Abdomen soft Nontender and bowel sounds active x 4 Extremities - No edema. Neuro - Grossly intact.   Results for orders placed in visit on 01/25/14  POCT RAPID STREP A (OFFICE)      Result Value Ref Range   Rapid Strep A Screen Negative  Negative      Assessment & Plan:  Sore throat - Plan: POCT rapid strep A, doxycycline (VIBRA-TABS) 100 MG tablet  Fever - Plan: POCT rapid strep A, doxycycline (VIBRA-TABS) 100 MG tablet  Push po fluids, rest, tylenol and motrin otc prn as directed for fever, arthralgias, and myalgias.  Follow up prn if sx's continue or persist.  Deatra Canter FNP

## 2014-02-03 ENCOUNTER — Ambulatory Visit: Payer: Medicare HMO | Admitting: Urology

## 2014-02-10 ENCOUNTER — Ambulatory Visit: Payer: Commercial Managed Care - HMO | Admitting: Family Medicine

## 2014-02-10 ENCOUNTER — Telehealth: Payer: Self-pay | Admitting: Family Medicine

## 2014-02-11 ENCOUNTER — Other Ambulatory Visit: Payer: Self-pay | Admitting: General Practice

## 2014-02-11 ENCOUNTER — Encounter: Payer: Self-pay | Admitting: General Practice

## 2014-02-11 ENCOUNTER — Ambulatory Visit (INDEPENDENT_AMBULATORY_CARE_PROVIDER_SITE_OTHER): Payer: Medicare HMO | Admitting: General Practice

## 2014-02-11 VITALS — BP 139/72 | HR 73 | Temp 97.8°F | Ht 60.0 in | Wt 146.0 lb

## 2014-02-11 DIAGNOSIS — N390 Urinary tract infection, site not specified: Secondary | ICD-10-CM

## 2014-02-11 DIAGNOSIS — R300 Dysuria: Secondary | ICD-10-CM

## 2014-02-11 LAB — POCT URINALYSIS DIPSTICK
Bilirubin, UA: NEGATIVE
GLUCOSE UA: NEGATIVE
Ketones, UA: NEGATIVE
NITRITE UA: NEGATIVE
Spec Grav, UA: 1.02
UROBILINOGEN UA: 0.2
pH, UA: 5

## 2014-02-11 MED ORDER — DOXYCYCLINE HYCLATE 100 MG PO TABS: 100.0000 mg | ORAL_TABLET | Freq: Two times a day (BID) | ORAL | Status: DC

## 2014-02-11 NOTE — Patient Instructions (Signed)
Urinary Tract Infection  Urinary tract infections (UTIs) can develop anywhere along your urinary tract. Your urinary tract is your body's drainage system for removing wastes and extra water. Your urinary tract includes two kidneys, two ureters, a bladder, and a urethra. Your kidneys are a pair of bean-shaped organs. Each kidney is about the size of your fist. They are located below your ribs, one on each side of your spine.  CAUSES  Infections are caused by microbes, which are microscopic organisms, including fungi, viruses, and bacteria. These organisms are so small that they can only be seen through a microscope. Bacteria are the microbes that most commonly cause UTIs.  SYMPTOMS   Symptoms of UTIs may vary by age and gender of the patient and by the location of the infection. Symptoms in young women typically include a frequent and intense urge to urinate and a painful, burning feeling in the bladder or urethra during urination. Older women and men are more likely to be tired, shaky, and weak and have muscle aches and abdominal pain. A fever may mean the infection is in your kidneys. Other symptoms of a kidney infection include pain in your back or sides below the ribs, nausea, and vomiting.  DIAGNOSIS  To diagnose a UTI, your caregiver will ask you about your symptoms. Your caregiver also will ask to provide a urine sample. The urine sample will be tested for bacteria and white blood cells. White blood cells are made by your body to help fight infection.  TREATMENT   Typically, UTIs can be treated with medication. Because most UTIs are caused by a bacterial infection, they usually can be treated with the use of antibiotics. The choice of antibiotic and length of treatment depend on your symptoms and the type of bacteria causing your infection.  HOME CARE INSTRUCTIONS   If you were prescribed antibiotics, take them exactly as your caregiver instructs you. Finish the medication even if you feel better after you  have only taken some of the medication.   Drink enough water and fluids to keep your urine clear or pale yellow.   Avoid caffeine, tea, and carbonated beverages. They tend to irritate your bladder.   Empty your bladder often. Avoid holding urine for long periods of time.   Empty your bladder before and after sexual intercourse.   After a bowel movement, women should cleanse from front to back. Use each tissue only once.  SEEK MEDICAL CARE IF:    You have back pain.   You develop a fever.   Your symptoms do not begin to resolve within 3 days.  SEEK IMMEDIATE MEDICAL CARE IF:    You have severe back pain or lower abdominal pain.   You develop chills.   You have nausea or vomiting.   You have continued burning or discomfort with urination.  MAKE SURE YOU:    Understand these instructions.   Will watch your condition.   Will get help right away if you are not doing well or get worse.  Document Released: 05/28/2005 Document Revised: 02/17/2012 Document Reviewed: 09/26/2011  Exit[employer] Patient Information 2014 [employer].

## 2014-02-11 NOTE — Progress Notes (Signed)
   Subjective:    Patient ID: [name], female    DOB: Oct 09, 1930, 80 y.o.   MRN: [id number]  Dysuria  This is a new problem. The current episode started in the past 7 days. The problem occurs every urination. The problem has been gradually worsening. The quality of the pain is described as aching and burning. The pain is at a severity of 4/10. The pain is mild. There has been no fever. She is not sexually active. There is no history of pyelonephritis. Associated symptoms include frequency and urgency. Pertinent negatives include no chills, discharge, flank pain, hematuria, hesitancy, nausea or possible pregnancy. She has tried nothing for the symptoms. There is no history of kidney stones or recurrent UTIs.      Review of Systems  Constitutional: Negative for fever and chills.  Respiratory: Negative for chest tightness and shortness of breath.   Cardiovascular: Negative for chest pain and palpitations.  Gastrointestinal: Positive for abdominal pain. Negative for nausea and abdominal distention.  Genitourinary: Positive for dysuria, urgency and frequency. Negative for hesitancy, hematuria and flank pain.  Neurological: Negative for dizziness and weakness.       Objective:   Physical Exam  Constitutional: She is oriented to person, place, and time. She appears well-developed and well-nourished.  Cardiovascular: Normal rate, regular rhythm and normal heart sounds.   Pulmonary/Chest: Effort normal and breath sounds normal. No respiratory distress. She exhibits no tenderness.  Abdominal: Soft. Bowel sounds are normal. She exhibits no distension. There is tenderness in the epigastric area. There is no CVA tenderness.  Neurological: She is alert and oriented to person, place, and time.  Skin: Skin is warm and dry.  Psychiatric: She has a normal mood and affect.      Results for orders placed in visit on 02/11/14  POCT URINALYSIS DIPSTICK      Result Value Ref Range   Color, UA  yellow     Clarity, UA clear     Glucose, UA negative     Bilirubin, UA negative     Ketones, UA negative     Spec Grav, UA 1.020     Blood, UA trace     pH, UA 5.0     Protein, UA trace     Urobilinogen, UA 0.2     Nitrite, UA negative     Leukocytes, UA small (1+)         Assessment & Plan:  1. Dysuria  - POCT urinalysis dipstick  2. UTI (urinary tract infection) - doxycycline (VIBRA-TABS) 100 MG tablet; Take 1 tablet (100 mg total) by mouth 2 (two) times daily.  Dispense: 14 tablet; Refill: 0 - Urine culture -Increase fluid intake Frequent voiding Proper perineal hygiene RTO prn Culture pending Patient verbalized understanding Coralie KeensMae E. Tallie Hevia, FNP-C

## 2014-02-13 ENCOUNTER — Ambulatory Visit: Payer: Commercial Managed Care - HMO

## 2014-02-13 ENCOUNTER — Telehealth: Payer: Self-pay | Admitting: General Practice

## 2014-02-13 NOTE — Telephone Encounter (Signed)
Error--pt coming for appt 6/16

## 2014-02-14 ENCOUNTER — Ambulatory Visit (INDEPENDENT_AMBULATORY_CARE_PROVIDER_SITE_OTHER): Payer: Medicare HMO | Admitting: Family

## 2014-02-14 ENCOUNTER — Encounter: Payer: Self-pay | Admitting: Family

## 2014-02-14 VITALS — BP 143/71 | HR 68 | Temp 98.4°F

## 2014-02-14 DIAGNOSIS — N390 Urinary tract infection, site not specified: Secondary | ICD-10-CM | POA: Diagnosis not present

## 2014-02-14 NOTE — Patient Instructions (Signed)
Urinary Tract Infection Urinary tract infections (UTIs) can develop anywhere along your urinary tract. Your urinary tract is your body's drainage system for removing wastes and extra water. Your urinary tract includes two kidneys, two ureters, a bladder, and a urethra. Your kidneys are a pair of bean-shaped organs. Each kidney is about the size of your fist. They are located below your ribs, one on each side of your spine. CAUSES Infections are caused by microbes, which are microscopic organisms, including fungi, viruses, and bacteria. These organisms are so small that they can only be seen through a microscope. Bacteria are the microbes that most commonly cause UTIs. SYMPTOMS  Symptoms of UTIs may vary by age and gender of the patient and by the location of the infection. Symptoms in young women typically include a frequent and intense urge to urinate and a painful, burning feeling in the bladder or urethra during urination. Older women and men are more likely to be tired, shaky, and weak and have muscle aches and abdominal pain. A fever may mean the infection is in your kidneys. Other symptoms of a kidney infection include pain in your back or sides below the ribs, nausea, and vomiting. DIAGNOSIS To diagnose a UTI, your caregiver will ask you about your symptoms. Your caregiver also will ask to provide a urine sample. The urine sample will be tested for bacteria and white blood cells. White blood cells are made by your body to help fight infection. TREATMENT  Typically, UTIs can be treated with medication. Because most UTIs are caused by a bacterial infection, they usually can be treated with the use of antibiotics. The choice of antibiotic and length of treatment depend on your symptoms and the type of bacteria causing your infection. HOME CARE INSTRUCTIONS  If you were prescribed antibiotics, take them exactly as your caregiver instructs you. Finish the medication even if you feel better after you  have only taken some of the medication.  Drink enough water and fluids to keep your urine clear or pale yellow.  Avoid caffeine, tea, and carbonated beverages. They tend to irritate your bladder.  Empty your bladder often. Avoid holding urine for long periods of time.  Empty your bladder before and after sexual intercourse.  After a bowel movement, women should cleanse from front to back. Use each tissue only once. SEEK MEDICAL CARE IF:   You have back pain.  You develop a fever.  Your symptoms do not begin to resolve within 3 days. SEEK IMMEDIATE MEDICAL CARE IF:   You have severe back pain or lower abdominal pain.  You develop chills.  You have nausea or vomiting.  You have continued burning or discomfort with urination. MAKE SURE YOU:   Understand these instructions.  Will watch your condition.  Will get help right away if you are not doing well or get worse. Document Released: 05/28/2005 Document Revised: 02/17/2012 Document Reviewed: 09/26/2011 [facility] Patient Information 2014 [employer], [place].   Force fluids AZO over the counter X2 days

## 2014-02-14 NOTE — Progress Notes (Signed)
   Subjective:    Patient ID: [name], female    DOB: [birth year], 80 y.o.   MRN: [id number]  Urinary Tract Infection    PT here to follow-up on a UTI. PT was seen in the office on 02/11/14 and given RX of doxycycline 100mg  BID was given to pt. Pt states she has had a hard time "swallowing" medication, but is "trying". Pt has a long list of allergies. Pt states she still has a "little burning, but not bad". No other complaints at this time.    Review of Systems  HENT: Negative.   Respiratory: Negative.   Cardiovascular: Negative.   Genitourinary: Positive for dysuria.  Musculoskeletal: Negative.   Hematological: Negative.   All other systems reviewed and are negative.      Objective:   Physical Exam  Vitals reviewed. Constitutional: She is oriented to person, place, and time. She appears well-developed and well-nourished. No distress.  Cardiovascular: Normal rate, regular rhythm, normal heart sounds and intact distal pulses.   No murmur heard. Pulmonary/Chest: Effort normal and breath sounds normal. No respiratory distress. She has no wheezes.  Abdominal: Soft. Bowel sounds are normal. She exhibits no distension. There is tenderness (MIld tenderness in lower abd).  Musculoskeletal: Normal range of motion. She exhibits no edema and no tenderness.  Neg for CVA tenderness   Neurological: She is alert and oriented to person, place, and time. She has normal reflexes. No cranial nerve deficit.  Skin: Skin is warm and dry.  Psychiatric: She has a normal mood and affect. Her behavior is normal. Judgment and thought content normal.    BP 143/71  Pulse 68  Temp(Src) 98.4 F (36.9 C) (Oral)       Assessment & Plan:  1. UTI (urinary tract infection) -Finish antibotic Force fluids AZO over the counter X2 days RTO prn Culture pending - POCT urinalysis dipstick - Urine culture  Jannifer Rodneyhristy Hawks, FNP

## 2014-02-14 NOTE — Addendum Note (Signed)
Addended by: Prescott GumLAND, JESSICA M on: 02/14/2014 11:44 AM   Modules accepted: Orders

## 2014-02-18 ENCOUNTER — Emergency Department (HOSPITAL_COMMUNITY): Payer: Medicare HMO

## 2014-02-18 ENCOUNTER — Emergency Department (HOSPITAL_COMMUNITY)
Admission: EM | Admit: 2014-02-18 | Discharge: 2014-02-18 | Disposition: A | Discharge status: Home / Self Care | Payer: Medicare HMO | Attending: Emergency Medicine | Admitting: Emergency Medicine

## 2014-02-18 ENCOUNTER — Encounter (HOSPITAL_COMMUNITY): Payer: Self-pay | Admitting: Emergency Medicine

## 2014-02-18 DIAGNOSIS — J4489 Other specified chronic obstructive pulmonary disease: Secondary | ICD-10-CM | POA: Insufficient documentation

## 2014-02-18 DIAGNOSIS — K219 Gastro-esophageal reflux disease without esophagitis: Secondary | ICD-10-CM | POA: Diagnosis not present

## 2014-02-18 DIAGNOSIS — Z79899 Other long term (current) drug therapy: Secondary | ICD-10-CM | POA: Insufficient documentation

## 2014-02-18 DIAGNOSIS — Z8639 Personal history of other endocrine, nutritional and metabolic disease: Secondary | ICD-10-CM | POA: Insufficient documentation

## 2014-02-18 DIAGNOSIS — Z880 Allergy status to penicillin: Secondary | ICD-10-CM | POA: Diagnosis not present

## 2014-02-18 DIAGNOSIS — J449 Chronic obstructive pulmonary disease, unspecified: Secondary | ICD-10-CM | POA: Diagnosis not present

## 2014-02-18 DIAGNOSIS — Z792 Long term (current) use of antibiotics: Secondary | ICD-10-CM | POA: Insufficient documentation

## 2014-02-18 DIAGNOSIS — Z7902 Long term (current) use of antithrombotics/antiplatelets: Secondary | ICD-10-CM | POA: Diagnosis not present

## 2014-02-18 DIAGNOSIS — I10 Essential (primary) hypertension: Secondary | ICD-10-CM | POA: Diagnosis not present

## 2014-02-18 DIAGNOSIS — F411 Generalized anxiety disorder: Secondary | ICD-10-CM | POA: Diagnosis not present

## 2014-02-18 DIAGNOSIS — R5381 Other malaise: Secondary | ICD-10-CM | POA: Insufficient documentation

## 2014-02-18 DIAGNOSIS — Z9889 Other specified postprocedural states: Secondary | ICD-10-CM | POA: Insufficient documentation

## 2014-02-18 DIAGNOSIS — R5383 Other fatigue: Secondary | ICD-10-CM

## 2014-02-18 DIAGNOSIS — I252 Old myocardial infarction: Secondary | ICD-10-CM | POA: Insufficient documentation

## 2014-02-18 DIAGNOSIS — Z91040 Latex allergy status: Secondary | ICD-10-CM | POA: Insufficient documentation

## 2014-02-18 DIAGNOSIS — I2510 Atherosclerotic heart disease of native coronary artery without angina pectoris: Secondary | ICD-10-CM | POA: Diagnosis not present

## 2014-02-18 DIAGNOSIS — Z862 Personal history of diseases of the blood and blood-forming organs and certain disorders involving the immune mechanism: Secondary | ICD-10-CM | POA: Diagnosis not present

## 2014-02-18 DIAGNOSIS — Z9861 Coronary angioplasty status: Secondary | ICD-10-CM | POA: Insufficient documentation

## 2014-02-18 LAB — DIFFERENTIAL
BASOS PCT: 1 % (ref 0–1)
Basophils Absolute: 0.1 10*3/uL (ref 0.0–0.1)
Eosinophils Absolute: 0.1 10*3/uL (ref 0.0–0.7)
Eosinophils Relative: 3 % (ref 0–5)
Lymphocytes Relative: 28 % (ref 12–46)
Lymphs Abs: 1.6 10*3/uL (ref 0.7–4.0)
MONO ABS: 0.5 10*3/uL (ref 0.1–1.0)
Monocytes Relative: 9 % (ref 3–12)
NEUTROS ABS: 3.4 10*3/uL (ref 1.7–7.7)
Neutrophils Relative %: 59 % (ref 43–77)

## 2014-02-18 LAB — URINALYSIS, ROUTINE W REFLEX MICROSCOPIC
Bilirubin Urine: NEGATIVE
Glucose, UA: NEGATIVE mg/dL
HGB URINE DIPSTICK: NEGATIVE
KETONES UR: NEGATIVE mg/dL
Nitrite: NEGATIVE
PH: 5.5 (ref 5.0–8.0)
Protein, ur: NEGATIVE mg/dL
Specific Gravity, Urine: 1.02 (ref 1.005–1.030)
Urobilinogen, UA: 0.2 mg/dL (ref 0.0–1.0)

## 2014-02-18 LAB — COMPREHENSIVE METABOLIC PANEL
ALBUMIN: 3.2 g/dL — AB (ref 3.5–5.2)
ALT: 6 U/L (ref 0–35)
AST: 13 U/L (ref 0–37)
Alkaline Phosphatase: 59 U/L (ref 39–117)
BILIRUBIN TOTAL: 0.7 mg/dL (ref 0.3–1.2)
BUN: 18 mg/dL (ref 6–23)
CALCIUM: 8.6 mg/dL (ref 8.4–10.5)
CO2: 25 mEq/L (ref 19–32)
CREATININE: 0.9 mg/dL (ref 0.50–1.10)
Chloride: 105 mEq/L (ref 96–112)
GFR calc Af Amer: 68 mL/min — ABNORMAL LOW (ref >90)
GFR calc non Af Amer: 58 mL/min — ABNORMAL LOW (ref >90)
Glucose, Bld: 108 mg/dL — ABNORMAL HIGH (ref 70–99)
Potassium: 3.7 mEq/L (ref 3.7–5.3)
Sodium: 142 mEq/L (ref 137–147)
Total Protein: 6 g/dL (ref 6.0–8.3)

## 2014-02-18 LAB — RAPID URINE DRUG SCREEN, HOSP PERFORMED
AMPHETAMINES: NOT DETECTED
BENZODIAZEPINES: NOT DETECTED
Barbiturates: NOT DETECTED
Cocaine: NOT DETECTED
OPIATES: NOT DETECTED
Tetrahydrocannabinol: NOT DETECTED

## 2014-02-18 LAB — URINE MICROSCOPIC-ADD ON

## 2014-02-18 LAB — CBC
HEMATOCRIT: 37.2 % (ref 36.0–46.0)
HEMOGLOBIN: 12.2 g/dL (ref 12.0–15.0)
MCH: 30.7 pg (ref 26.0–34.0)
MCHC: 32.8 g/dL (ref 30.0–36.0)
MCV: 93.7 fL (ref 78.0–100.0)
Platelets: 183 10*3/uL (ref 150–400)
RBC: 3.97 MIL/uL (ref 3.87–5.11)
RDW: 14.5 % (ref 11.5–15.5)
WBC: 5.7 10*3/uL (ref 4.0–10.5)

## 2014-02-18 LAB — ETHANOL

## 2014-02-18 LAB — I-STAT TROPONIN, ED: TROPONIN I, POC: 0 ng/mL (ref 0.00–0.08)

## 2014-02-18 NOTE — ED Notes (Signed)
Ambulated back and forth to bathroom with escort but did not need assistance.gait steady

## 2014-02-18 NOTE — ED Notes (Signed)
Feeling weak and shaky for "a couple of days", was seen by PMD and dx'd with UTI. States she's felt weak since then

## 2014-02-18 NOTE — ED Provider Notes (Signed)
CSN: [id number]     Arrival date & time 02/18/14  0143 History   First MD Initiated Contact with Patient 02/18/14 0208     Chief Complaint  Patient presents with  . Weakness      Patient is a 80 y.o. female presenting with weakness. The history is provided by the patient and a relative.  Weakness This is a new problem. Episode onset: just prior to arrival. The problem occurs constantly. The problem has not changed since onset.Pertinent negatives include no chest pain, no abdominal pain, no headaches and no shortness of breath. Nothing aggravates the symptoms. Nothing relieves the symptoms.  pt presents for fatigue She reports when she woke up tonight she had weakness "all over" She denies focal weakness.  No dizziness is reported. No visual changes No HA Family at bedside reports she required assistance getting out of bed   She reports recent UTI and has been taking antibiotic.  She reports continued dysuria    Past Medical History  Diagnosis Date  . Coronary atherosclerosis of native coronary artery     DES LAD and BMS LAD 8/08  . Mixed hyperlipidemia   . GERD (gastroesophageal reflux disease)   . Essential hypertension, benign   . Anxiety   . Resting tremor   . COPD (chronic obstructive pulmonary disease)   . Rickets   . MI (myocardial infarction) 2008   Past Surgical History  Procedure Laterality Date  . Appendectomy    . Cholecystectomy    . Bilateral cataract surgery    . Vein ligation and stripping    . Cardiac catheterization    . Coronary angioplasty with stent placement    . Left trochanteric bursa injection  11/27/11    DR. O'TOOLE  . Left sacroiliac joint injection  11/27/11    DR. O'TOOLE  . Yag laser application  09/06/2012    Procedure: YAG LASER APPLICATION;  Surgeon: Susa Simmonds, MD;  Location: [facility];  Service: Ophthalmology;  Laterality: Left;   Family History  Problem Relation Age of Onset  . Hypertension     History  Substance Use Topics   . Smoking status: Never Smoker   . Smokeless tobacco: Never Used  . Alcohol Use: No   OB History   Grav Para Term Preterm Abortions TAB SAB Ect Mult Living   4 4 4       4      Review of Systems  Constitutional: Negative for fever.  Respiratory: Negative for cough and shortness of breath.   Cardiovascular: Negative for chest pain.  Gastrointestinal: Negative for abdominal pain.  Neurological: Positive for weakness. Negative for dizziness, speech difficulty and headaches.  All other systems reviewed and are negative.     Allergies  Azithromycin; Cephalexin; Ciprofloxacin; Codeine; Contrast media; Latex; Lorazepam; Nitrofuran derivatives; Nsaids; Nutritional supplements; Penicillins; Phenazopyridine; Quinolones; Sulfa antibiotics; Toviaz; and Trimethoprim  Home Medications   Prior to Admission medications   Medication Sig Start Date End Date Taking? Authorizing [place]  acetaminophen (TYLENOL) 500 MG tablet Take 500 mg by mouth every 6 (six) hours as needed. pain    Historical Jaquarius Seder, MD  albuterol (PROVENTIL) (2.5 MG/3ML) 0.083% nebulizer solution Take 3 mLs (2.5 mg total) by nebulization every 6 (six) hours as needed for wheezing or shortness of breath. 01/10/14   Deatra Canter, FNP  atorvastatin (LIPITOR) 20 MG tablet Take 1 tablet (20 mg total) by mouth daily at 6 PM. 11/09/13   Deatra Canter, FNP  calcium citrate-vitamin  D (CITRACAL+D) 315-200 MG-UNIT per tablet Take 1 tablet by mouth daily.    Historical Jerre Diguglielmo, MD  cholecalciferol (VITAMIN D) 1000 UNITS tablet Take 4,000 Units by mouth daily.    Historical Vivian Okelley, MD  clopidogrel (PLAVIX) 75 MG tablet TAKE 1 TABLET BY MOUTH DAILY 10/19/13   Jonelle SidleSamuel G McDowell, MD  doxycycline (VIBRA-TABS) 100 MG tablet Take 1 tablet (100 mg total) by mouth 2 (two) times daily. 02/11/14   Coralie KeensMae E Haliburton, FNP  ESTRACE VAGINAL 0.1 MG/GM vaginal cream Place 1 Applicatorful vaginally 3 (three) times a week.  07/22/13   Historical  Breleigh Carpino, MD  isosorbide mononitrate (IMDUR) 30 MG 24 hr tablet Take 1 tablet (30 mg total) by mouth 2 (two) times daily. 09/05/13   Jonelle SidleSamuel G McDowell, MD  lisinopril (PRINIVIL,ZESTRIL) 10 MG tablet TAKE 1 TABLET BY MOUTH EVERY DAY 10/19/13   Jonelle SidleSamuel G McDowell, MD  LORazepam (ATIVAN) 0.5 MG tablet Take 1 tablet (0.5 mg total) by mouth 2 (two) times daily as needed for anxiety. 09/26/13   Deatra CanterWilliam J Oxford, FNP  nitroGLYCERIN (NITROSTAT) 0.4 MG SL tablet Place 1 tablet (0.4 mg total) under the tongue every 5 (five) minutes as needed. 09/10/12   Jonelle SidleSamuel G McDowell, MD  ondansetron (ZOFRAN) 8 MG tablet Take 1 tablet (8 mg total) by mouth every 4 (four) hours as needed for nausea or vomiting. 09/17/13   Donnetta HutchingBrian Cook, MD  pantoprazole (PROTONIX) 40 MG tablet TAKE 1 TABLET BY MOUTH EVERY DAY [facility] 12/12/13   Jonelle SidleSamuel G McDowell, MD   BP 156/67  Pulse 56  Temp(Src) 98 F (36.7 C)  Resp 11  Ht 5\' 4"  (1.626 m)  Wt 146 lb (66.225 kg)  BMI 25.05 kg/m2  SpO2 98% Physical Exam CONSTITUTIONAL: Well developed/well nourished HEAD: Normocephalic/atraumatic EYES: EOMI/PERRL, no nystagmus, no ptosis ENMT: Mucous membranes moist NECK: supple no meningeal signs CV: S1/S2 noted, no murmurs/rubs/gallops noted LUNGS: crackles left base, no apparent distress ABDOMEN: soft, nontender, no rebound or guarding GU:no cva tenderness NEURO:Awake/alert, facies symmetric, no arm or leg drift is noted Sensation to light touch intact in all extremities Mild tremor noted in bilateral UE (baseline for patient) EXTREMITIES: pulses normal, full ROM SKIN: warm, color normal PSYCH: no abnormalities of mood noted  ED Course  Procedures  2:46 AM Pt here for generalized fatigue.  On my evaluation, she reported it just started prior to arrival, however she told nurse it had been present for days.  Family at bedside reported she needed assistance getting out of bed at home which is new.  No focal signs to suggest acute CVA.  However,  workup initiated including labs/CT head/CXR to evaluate for generalized weakness  4:23 AM Pt reports improvement She is ambulatory without difficulty On further discussion with patient/son, pt has had these episodes previously.  She has no signs of acute CVA, and history does not support TIA after further discussion Pt would prefer to go home and f/u with her PCP We discussed strict return precautions (she lives with family who will watch over her)  Labs Review Labs Reviewed  COMPREHENSIVE METABOLIC PANEL - Abnormal; Notable for the following:    Glucose, Bld 108 (*)    Albumin 3.2 (*)    GFR calc non Af Amer 58 (*)    GFR calc Af Amer 68 (*)    All other components within normal limits  URINALYSIS, ROUTINE W REFLEX MICROSCOPIC - Abnormal; Notable for the following:    Leukocytes, UA SMALL (*)  All other components within normal limits  URINE MICROSCOPIC-ADD ON - Abnormal; Notable for the following:    Squamous Epithelial / LPF FEW (*)    All other components within normal limits  ETHANOL  CBC  DIFFERENTIAL  URINE RAPID DRUG SCREEN (HOSP PERFORMED)  Rosezena SensorI-STAT TROPOININ, ED    Imaging Review Dg Chest 2 View  02/18/2014   CLINICAL DATA:  Weakness for 2 days.  EXAM: CHEST  2 VIEW  COMPARISON:  Chest radiograph performed 02/02/2014  FINDINGS: The lungs are well-aerated. Minimal right basilar scarring is noted. There is no evidence of focal opacification, pleural effusion or pneumothorax.  The heart is normal in size; the mediastinal contour is within normal limits. No acute osseous abnormalities are seen.  IMPRESSION: No acute cardiopulmonary process seen.   Electronically Signed   By: Roanna RaiderJeffery  Chang M.D.   On: 02/18/2014 03:31   Ct Head Wo Contrast  02/18/2014   CLINICAL DATA:  Weakness, recent diagnosis of urinary tract infection  EXAM: CT HEAD WITHOUT CONTRAST  TECHNIQUE: Contiguous axial images were obtained from the base of the skull through the vertex without intravenous contrast.   COMPARISON:  CT 11/04/2009  FINDINGS: No acute intracranial hemorrhage. No focal mass lesion. No CT evidence of acute infarction. No midline shift or mass effect. No hydrocephalus. Basilar cisterns are patent.  There is periventricular and subcortical white matter hypodensities. White matter disease is increased from prior.  Paranasal sinuses and  mastoid air cells are clear.  IMPRESSION: 1. No acute intracranial findings. 2. Atrophy and chronic white matter microvascular disease is advance compared to 2011.   Electronically Signed   By: Genevive BiStewart  Edmunds M.D.   On: 02/18/2014 03:03     EKG Interpretation   Date/Time:  Saturday February 18 2014 02:30:46 EDT Ventricular Rate:  57 PR Interval:  124 QRS Duration: 88 QT Interval:  424 QTC Calculation: 413 R Axis:   55 Text Interpretation:  Sinus rhythm Atrial premature complexes ECG  OTHERWISE WITHIN NORMAL LIMITS Confirmed by Bebe ShaggyWICKLINE  MD, Dorinda HillNALD ([id number])  on 02/18/2014 2:39:50 AM      MDM   Final diagnoses:  Other fatigue    Nursing notes including past medical history and social history reviewed and considered in documentation Labs/vital reviewed and considered xrays reviewed and considered     Joya Gaskinsonald W Wickline, MD 02/18/14 [phone]

## 2014-02-18 NOTE — ED Notes (Signed)
Attempted to get pt to provide a urine spec. Pt states she cannot go at this time

## 2014-02-18 NOTE — Discharge Instructions (Signed)

## 2014-02-22 ENCOUNTER — Other Ambulatory Visit: Payer: Self-pay | Admitting: *Deleted

## 2014-02-22 ENCOUNTER — Other Ambulatory Visit: Payer: Self-pay | Admitting: Family

## 2014-02-22 DIAGNOSIS — R251 Tremor, unspecified: Secondary | ICD-10-CM

## 2014-02-22 MED ORDER — LORAZEPAM 0.5 MG PO TABS: 0.5000 mg | ORAL_TABLET | Freq: Two times a day (BID) | ORAL | Status: DC | PRN

## 2014-02-22 NOTE — Telephone Encounter (Signed)
Patient last seen in office on 02-14-14. Rx last filled on 08-24-13 for #60. Please advise. If approved please route to pool A so [facility] [phone]

## 2014-03-02 LAB — URINE CULTURE

## 2014-03-14 ENCOUNTER — Ambulatory Visit (INDEPENDENT_AMBULATORY_CARE_PROVIDER_SITE_OTHER): Payer: Medicare HMO | Admitting: Cardiology

## 2014-03-14 ENCOUNTER — Encounter: Payer: Self-pay | Admitting: Cardiology

## 2014-03-14 VITALS — BP 134/81 | HR 65 | Ht 59.0 in | Wt 147.1 lb

## 2014-03-14 DIAGNOSIS — I10 Essential (primary) hypertension: Secondary | ICD-10-CM

## 2014-03-14 DIAGNOSIS — E785 Hyperlipidemia, unspecified: Secondary | ICD-10-CM

## 2014-03-14 DIAGNOSIS — I2510 Atherosclerotic heart disease of native coronary artery without angina pectoris: Secondary | ICD-10-CM

## 2014-03-14 NOTE — Progress Notes (Signed)
Clinical Summary [name] is an 80 y.o.female last seen in January. She reports intermittent palpitations, no progressive chest pain symptoms. We reviewed her medications which are stable from a cardiac perspective.  Recent lab work in June showed normal troponin I, hemoglobin 12.2, platelets 183, potassium 3.7, BUN 18, creatinine 0.9, normal LFTs. ECG from June showed sinus rhythm with no acute ST segment changes.  Cardiac catheterization in October 2012 showed patent stents within the mid to distal LAD, moderate disease within the diagonal that has been managed medically.   Allergies  Allergen Reactions  . Azithromycin   . Cephalexin     Causes her to shake  . Ciprofloxacin   . Codeine     REACTION: shakiness  . Contrast Media [Iodinated Diagnostic Agents]   . Latex   . Lorazepam Other (See Comments)    Patient states she can not take  . Nitrofuran Derivatives   . Nsaids   . Nutritional Supplements   . Penicillins     REACTION: shakiness  . Phenazopyridine   . Quinolones   . Sulfa Antibiotics   . Toviaz [Fesoterodine Fumarate Er]   . Trimethoprim     Current Outpatient Prescriptions  Medication Sig Dispense Refill  . acetaminophen (TYLENOL) 500 MG tablet Take 500 mg by mouth every 6 (six) hours as needed. pain      . albuterol (PROVENTIL) (2.5 MG/3ML) 0.083% nebulizer solution Take 3 mLs (2.5 mg total) by nebulization every 6 (six) hours as needed for wheezing or shortness of breath.  150 mL  1  . atorvastatin (LIPITOR) 20 MG tablet Take 1 tablet (20 mg total) by mouth daily at 6 PM.  30 tablet  11  . calcium citrate-vitamin D (CITRACAL+D) 315-200 MG-UNIT per tablet Take 1 tablet by mouth daily.      . cholecalciferol (VITAMIN D) 1000 UNITS tablet Take 4,000 Units by mouth daily.      . clopidogrel (PLAVIX) 75 MG tablet TAKE 1 TABLET BY MOUTH DAILY  30 tablet  6  . ESTRACE VAGINAL 0.1 MG/GM vaginal cream Place 1 Applicatorful vaginally 3 (three) times a week.       .  isosorbide mononitrate (IMDUR) 30 MG 24 hr tablet Take 1 tablet (30 mg total) by mouth 2 (two) times daily.  60 tablet  6  . lisinopril (PRINIVIL,ZESTRIL) 10 MG tablet TAKE 1 TABLET BY MOUTH EVERY DAY  30 tablet  6  . LORazepam (ATIVAN) 0.5 MG tablet Take 1 tablet (0.5 mg total) by mouth 2 (two) times daily as needed for anxiety.  60 tablet  0  . nitroGLYCERIN (NITROSTAT) 0.4 MG SL tablet Place 1 tablet (0.4 mg total) under the tongue every 5 (five) minutes as needed.  25 tablet  3  . ondansetron (ZOFRAN) 8 MG tablet Take 1 tablet (8 mg total) by mouth every 4 (four) hours as needed for nausea or vomiting.  10 tablet  0  . pantoprazole (PROTONIX) 40 MG tablet TAKE 1 TABLET BY MOUTH EVERY DAY AT NOON  30 tablet  6   Current Facility-Administered Medications  Medication Dose Route Frequency Provider Last Rate Last Dose  . levalbuterol (XOPENEX) nebulizer solution 1.25 mg  1.25 mg Nebulization Once Deatra Canter, FNP      . levalbuterol Pauline Aus) nebulizer solution 1.25 mg  1.25 mg Nebulization Once Deatra Canter, FNP        Past Medical History  Diagnosis Date  . Coronary atherosclerosis of native coronary artery  DES LAD and BMS LAD 8/08  . Mixed hyperlipidemia   . GERD (gastroesophageal reflux disease)   . Essential hypertension, benign   . Anxiety   . Resting tremor   . COPD (chronic obstructive pulmonary disease)   . Rickets   . MI (myocardial infarction) 2008    Social History Ms. Sawdey reports that she has never smoked. She has never used smokeless tobacco. Ms. Georgana CurioBibee reports that she does not drink alcohol.  Review of Systems Recent UTI. Stable tremor. Chronic anxiety. Other systems reviewed and negative.  Physical Examination Filed Vitals:   03/14/14 1522  BP: 134/81  Pulse: 65   Filed Weights   03/14/14 1522  Weight: 147 lb 1.9 oz (66.733 kg)    Comfortable at rest.  HEENT: Conjunctiva and lids normal, oropharynx clear.  Neck: Supple, no elevated jugular  venous pressure, no bruits.  Lungs: Clear to auscultation, nonlabored.  Cardiac: Regular rate and rhythm, no S3 gallop.  Abdomen: Soft, nontender, no bruits.  Skin: Warm and dry.  Extremities: No significant peripheral edema  Musculoskeletal: Mild kyphosis.  Neuropsychiatric: Alert and oriented x3, resting tremor. Somewhat anxious.   Problem List and Plan   CORONARY ATHEROSCLEROSIS NATIVE CORONARY ARTERY Symptomatically stable, recent lab work and ECG reviewed. Continue medical therapy and observation.  Essential hypertension, benign No change to antihypertensive regimen.  HYPERLIPIDEMIA Continues on Lipitor, followed by Dr. Christell ConstantMoore.    Jonelle SidleSamuel G. McDowell, M.D., F.A.C.C.

## 2014-03-14 NOTE — Assessment & Plan Note (Signed)
Symptomatically stable, recent lab work and ECG reviewed. Continue medical therapy and observation.

## 2014-03-14 NOTE — Assessment & Plan Note (Signed)
No change to antihypertensive regimen. 

## 2014-03-14 NOTE — Assessment & Plan Note (Signed)
Continues on Lipitor, followed by Dr. Christell ConstantMoore.

## 2014-03-14 NOTE — Patient Instructions (Signed)

## 2014-03-28 ENCOUNTER — Other Ambulatory Visit: Payer: Self-pay | Admitting: Family

## 2014-03-28 NOTE — Telephone Encounter (Signed)
Patient last seen in office on 02-14-14 for an acute visit. Rx last filled on 02-22-14 for #60. Please advise. If approved please route to Pool A so nurse can phone in to pharmacy

## 2014-03-28 NOTE — Telephone Encounter (Signed)
Called in.

## 2014-03-31 ENCOUNTER — Ambulatory Visit (INDEPENDENT_AMBULATORY_CARE_PROVIDER_SITE_OTHER): Payer: Medicare HMO | Admitting: Urology

## 2014-03-31 DIAGNOSIS — N3946 Mixed incontinence: Secondary | ICD-10-CM

## 2014-03-31 DIAGNOSIS — N3642 Intrinsic sphincter deficiency (ISD): Secondary | ICD-10-CM

## 2014-03-31 DIAGNOSIS — N952 Postmenopausal atrophic vaginitis: Secondary | ICD-10-CM

## 2014-04-07 ENCOUNTER — Other Ambulatory Visit: Payer: Self-pay | Admitting: Cardiology

## 2014-04-28 ENCOUNTER — Other Ambulatory Visit: Payer: Self-pay | Admitting: Family

## 2014-05-01 NOTE — Telephone Encounter (Signed)
Last ov 6/15. Last refill 03/29/14. If approved send to nurse pool to call in to [facility]. See allergies-it shows she is allergic to Lorazepam.

## 2014-05-01 NOTE — Telephone Encounter (Signed)
Script for Ativan called to VM at [facility].

## 2014-05-02 ENCOUNTER — Ambulatory Visit: Payer: Commercial Managed Care - HMO | Admitting: Family

## 2014-05-02 ENCOUNTER — Telehealth: Payer: Self-pay | Admitting: Family

## 2014-05-02 NOTE — Telephone Encounter (Signed)
Pt has appt coming up.  Pt aware of date / time

## 2014-05-08 ENCOUNTER — Other Ambulatory Visit: Payer: Self-pay | Admitting: Cardiology

## 2014-05-12 ENCOUNTER — Encounter: Payer: Self-pay | Admitting: Family

## 2014-05-12 ENCOUNTER — Ambulatory Visit (INDEPENDENT_AMBULATORY_CARE_PROVIDER_SITE_OTHER): Payer: Medicare HMO | Admitting: Family

## 2014-05-12 VITALS — BP 145/64 | HR 63 | Temp 98.3°F | Ht 59.0 in | Wt 149.6 lb

## 2014-05-12 DIAGNOSIS — R5383 Other fatigue: Secondary | ICD-10-CM

## 2014-05-12 DIAGNOSIS — R231 Pallor: Secondary | ICD-10-CM

## 2014-05-12 DIAGNOSIS — R259 Unspecified abnormal involuntary movements: Secondary | ICD-10-CM

## 2014-05-12 DIAGNOSIS — R5381 Other malaise: Secondary | ICD-10-CM

## 2014-05-12 DIAGNOSIS — R251 Tremor, unspecified: Secondary | ICD-10-CM

## 2014-05-12 DIAGNOSIS — R531 Weakness: Secondary | ICD-10-CM

## 2014-05-12 LAB — POCT UA - MICROSCOPIC ONLY
BACTERIA, U MICROSCOPIC: NEGATIVE
Casts, Ur, LPF, POC: NEGATIVE
Crystals, Ur, HPF, POC: NEGATIVE
Mucus, UA: NEGATIVE
Yeast, UA: NEGATIVE

## 2014-05-12 LAB — POCT URINALYSIS DIPSTICK
BILIRUBIN UA: NEGATIVE
Glucose, UA: NEGATIVE
KETONES UA: NEGATIVE
Nitrite, UA: NEGATIVE
PROTEIN UA: NEGATIVE
SPEC GRAV UA: 1.02
Urobilinogen, UA: NEGATIVE
pH, UA: 6

## 2014-05-12 LAB — POCT CBC
GRANULOCYTE PERCENT: 63.6 % (ref 37–80)
HCT, POC: 37 % — AB (ref 37.7–47.9)
Hemoglobin: 12.2 g/dL (ref 12.2–16.2)
Lymph, poc: 2.1 (ref 0.6–3.4)
MCH, POC: 30.3 pg (ref 27–31.2)
MCHC: 33 g/dL (ref 31.8–35.4)
MCV: 91.8 fL (ref 80–97)
MPV: 7.2 fL (ref 0–99.8)
POC Granulocyte: 4.7 (ref 2–6.9)
POC LYMPH PERCENT: 28.8 %L (ref 10–50)
Platelet Count, POC: 238 10*3/uL (ref 142–424)
RBC: 4 M/uL — AB (ref 4.04–5.48)
RDW, POC: 13.8 %
WBC: 7.4 10*3/uL (ref 4.6–10.2)

## 2014-05-12 NOTE — Patient Instructions (Signed)

## 2014-05-12 NOTE — Progress Notes (Signed)
Subjective:    Patient ID: [name], female    DOB: Oct 12, 1930, 80 y.o.   MRN: [id number]  Extremity Weakness  This is a recurrent problem. The current episode started more than 1 year ago. The problem occurs intermittently. The problem has been unchanged.   Pt presents to office for weakness and "shakes". Pt states she's been weak for years and states her mother told her she shakes because she had "rickets" as a child. Pt states the "shaking" gets worse when is is anxious or nervous. Pt went to [facility] ED for pneumonia and possible UTI. Pt was given azithromycin and promethazine and told to follow-up with PCP.     Review of Systems  HENT: Negative.   Eyes: Negative.   Respiratory: Negative.  Negative for shortness of breath.   Cardiovascular: Negative.  Negative for palpitations.  Gastrointestinal: Negative.   Endocrine: Negative.   Genitourinary: Negative.   Musculoskeletal: Positive for extremity weakness.  Neurological: Positive for tremors and weakness. Negative for headaches.  Hematological: Negative.   Psychiatric/Behavioral: Negative.   All other systems reviewed and are negative.      Objective:   Physical Exam  Vitals reviewed. Constitutional: She is oriented to person, place, and time. She appears well-developed and well-nourished. No distress.  HENT:  Head: Normocephalic and atraumatic.  Right Ear: External ear normal.  Left Ear: External ear normal.  Nose: Nose normal.  Mouth/Throat: Oropharynx is clear and moist.  Eyes: Pupils are equal, round, and reactive to light.  Neck: Normal range of motion. Neck supple. No thyromegaly present.  Cardiovascular: Normal rate, regular rhythm, normal heart sounds and intact distal pulses.   No murmur heard. Pulmonary/Chest: Effort normal and breath sounds normal. No respiratory distress. She has no wheezes.  Abdominal: Soft. Bowel sounds are normal. She exhibits no distension. There is no tenderness.    Musculoskeletal: Normal range of motion. She exhibits no edema and no tenderness.  Neurological: She is alert and oriented to person, place, and time. She has normal reflexes. No cranial nerve deficit.  Skin: Skin is warm and dry. There is pallor (yellowish).  Psychiatric: She has a normal mood and affect. Her behavior is normal. Judgment and thought content normal.      BP 145/64  Pulse 63  Temp(Src) 98.3 F (36.8 C) (Oral)  Ht 4' 11" (1.499 m)  Wt 149 lb 9.6 oz (67.858 kg)  BMI 30.20 kg/m2     Assessment & Plan:  1. Weakness - POCT CBC - POCT UA - Microscopic Only - POCT urinalysis dipstick - CMP14+EGFR - Thyroid Panel With TSH - Vit D  25 hydroxy (rtn osteoporosis monitoring)  2. Tremor - POCT CBC - CMP14+EGFR  3. Pale skin - POCT CBC - CMP14+EGFR  Rest Labs pending Continue all medications RTO in 2 week  Evelina Dun, FNP

## 2014-05-13 LAB — THYROID PANEL WITH TSH
Free Thyroxine Index: 1.9 (ref 1.2–4.9)
T3 UPTAKE RATIO: 26 % (ref 24–39)
T4, Total: 7.4 ug/dL (ref 4.5–12.0)
TSH: 2.27 u[IU]/mL (ref 0.450–4.500)

## 2014-05-13 LAB — VITAMIN D 25 HYDROXY (VIT D DEFICIENCY, FRACTURES): VIT D 25 HYDROXY: 31.7 ng/mL (ref 30.0–100.0)

## 2014-05-13 LAB — CMP14+EGFR
A/G RATIO: 2.1 (ref 1.1–2.5)
ALBUMIN: 4.5 g/dL (ref 3.5–4.7)
ALT: 5 IU/L (ref 0–32)
AST: 7 IU/L (ref 0–40)
Alkaline Phosphatase: 64 IU/L (ref 39–117)
BUN/Creatinine Ratio: 17 (ref 11–26)
BUN: 17 mg/dL (ref 8–27)
CALCIUM: 8.9 mg/dL (ref 8.7–10.3)
CO2: 23 mmol/L (ref 18–29)
CREATININE: 0.98 mg/dL (ref 0.57–1.00)
Chloride: 101 mmol/L (ref 97–108)
GFR calc Af Amer: 63 mL/min/{1.73_m2} (ref >59)
GFR, EST NON AFRICAN AMERICAN: 54 mL/min/{1.73_m2} — AB (ref >59)
GLUCOSE: 99 mg/dL (ref 65–99)
Globulin, Total: 2.1 g/dL (ref 1.5–4.5)
POTASSIUM: 4.2 mmol/L (ref 3.5–5.2)
Sodium: 140 mmol/L (ref 134–144)
TOTAL PROTEIN: 6.6 g/dL (ref 6.0–8.5)
Total Bilirubin: 0.8 mg/dL (ref 0.0–1.2)

## 2014-05-15 ENCOUNTER — Ambulatory Visit (INDEPENDENT_AMBULATORY_CARE_PROVIDER_SITE_OTHER): Payer: Medicare HMO | Admitting: Family Medicine

## 2014-05-15 ENCOUNTER — Encounter: Payer: Self-pay | Admitting: Family Medicine

## 2014-05-15 VITALS — BP 168/64 | HR 66 | Temp 97.5°F | Ht 59.0 in | Wt 150.0 lb

## 2014-05-15 DIAGNOSIS — R5381 Other malaise: Secondary | ICD-10-CM

## 2014-05-15 DIAGNOSIS — R5383 Other fatigue: Principal | ICD-10-CM

## 2014-05-15 DIAGNOSIS — N3000 Acute cystitis without hematuria: Secondary | ICD-10-CM

## 2014-05-15 LAB — POCT CBC
Granulocyte percent: 56.2 %G (ref 37–80)
HCT, POC: 36.6 % — AB (ref 37.7–47.9)
Hemoglobin: 12.2 g/dL (ref 12.2–16.2)
Lymph, poc: 2.7 (ref 0.6–3.4)
MCH, POC: 30.5 pg (ref 27–31.2)
MCHC: 33.4 g/dL (ref 31.8–35.4)
MCV: 91.3 fL (ref 80–97)
MPV: 7.2 fL (ref 0–99.8)
POC Granulocyte: 4.5 (ref 2–6.9)
POC LYMPH PERCENT: 33.9 %L (ref 10–50)
Platelet Count, POC: 232 10*3/uL (ref 142–424)
RBC: 4 M/uL — AB (ref 4.04–5.48)
RDW, POC: 13.9 %
WBC: 8 10*3/uL (ref 4.6–10.2)

## 2014-05-15 LAB — POCT URINALYSIS DIPSTICK
Bilirubin, UA: NEGATIVE
Glucose, UA: NEGATIVE
Ketones, UA: NEGATIVE
Nitrite, UA: NEGATIVE
Spec Grav, UA: 1.015
Urobilinogen, UA: NEGATIVE
pH, UA: 6

## 2014-05-15 LAB — POCT UA - MICROSCOPIC ONLY
Casts, Ur, LPF, POC: NEGATIVE
Crystals, Ur, HPF, POC: NEGATIVE
Mucus, UA: NEGATIVE
Yeast, UA: NEGATIVE

## 2014-05-15 MED ORDER — CIPROFLOXACIN HCL 500 MG PO TABS: 500.0000 mg | ORAL_TABLET | Freq: Two times a day (BID) | ORAL | Status: DC

## 2014-05-15 NOTE — Progress Notes (Signed)
   Subjective:    Patient ID: [name], female    DOB: Jan 15, 1931, 80 y.o.   MRN: [id number]  HPI This 80 y.o. female presents for evaluation of weakness and fatigue.  She has hx of UTI's.   Review of Systems No chest pain, SOB, HA, dizziness, vision change, N/V, diarrhea, constipation, dysuria, urinary urgency or frequency, myalgias, arthralgias or rash.     Objective:   Physical Exam Vital signs noted  Well developed well nourished female.  HEENT - Head atraumatic Normocephalic                Eyes - PERRLA, Conjuctiva - clear Sclera- Clear EOMI                Ears - EAC's Wnl TM's Wnl Gross Hearing WNL                Nose - Nares patent                 Throat - oropharanx wnl Respiratory - Lungs CTA bilateral Cardiac - RRR S1 and S2 without murmur GI - Abdomen soft Nontender and bowel sounds active x 4 Extremities - No edema. Neuro - Grossly intact.  Results for orders placed in visit on 05/15/14  POCT UA - MICROSCOPIC ONLY      Result Value Ref Range   WBC, Ur, HPF, POC 10-20     RBC, urine, microscopic 10-12     Bacteria, U Microscopic moderate     Mucus, UA negative     Epithelial cells, urine per micros moderate     Crystals, Ur, HPF, POC negative     Casts, Ur, LPF, POC negative     Yeast, UA negative    POCT URINALYSIS DIPSTICK      Result Value Ref Range   Color, UA gold     Clarity, UA clear     Glucose, UA negative     Bilirubin, UA negative     Ketones, UA negative     Spec Grav, UA 1.015     Blood, UA large     pH, UA 6.0     Protein, UA 1+     Urobilinogen, UA negative     Nitrite, UA negative     Leukocytes, UA small (1+)    POCT CBC      Result Value Ref Range   WBC 8.0  4.6 - 10.2 K/uL   Lymph, poc 2.7  0.6 - 3.4   POC LYMPH PERCENT 33.9  10 - 50 %L   POC Granulocyte 4.5  2 - 6.9   Granulocyte percent 56.2  37 - 80 %G   RBC 4.0 (*) 4.04 - 5.48 M/uL   Hemoglobin 12.2  12.2 - 16.2 g/dL   HCT, POC 78.2 (*) 95.6 - 47.9 %   MCV 91.3   80 - 97 fL   MCH, POC 30.5  27 - 31.2 pg   MCHC 33.4  31.8 - 35.4 g/dL   RDW, POC 21.3     Platelet Count, POC 232.0  142 - 424 K/uL   MPV 7.2  0 - 99.8 fL       Assessment & Plan:  Other malaise and fatigue - Plan: POCT UA - Microscopic Only, POCT urinalysis dipstick, POCT CBC, Urine culture  UTI - Cipro  one po bid x 7 days #14  Deatra Canter FNP

## 2014-05-17 ENCOUNTER — Encounter (HOSPITAL_COMMUNITY): Payer: Self-pay | Admitting: Emergency Medicine

## 2014-05-17 ENCOUNTER — Emergency Department (HOSPITAL_COMMUNITY)
Admission: EM | Admit: 2014-05-17 | Discharge: 2014-05-17 | Disposition: A | Discharge status: Home / Self Care | Payer: Medicare HMO | Attending: Emergency Medicine | Admitting: Emergency Medicine

## 2014-05-17 ENCOUNTER — Ambulatory Visit: Payer: Commercial Managed Care - HMO | Admitting: Family

## 2014-05-17 DIAGNOSIS — R5381 Other malaise: Secondary | ICD-10-CM | POA: Diagnosis present

## 2014-05-17 DIAGNOSIS — I2510 Atherosclerotic heart disease of native coronary artery without angina pectoris: Secondary | ICD-10-CM | POA: Diagnosis not present

## 2014-05-17 DIAGNOSIS — R5383 Other fatigue: Secondary | ICD-10-CM

## 2014-05-17 DIAGNOSIS — J449 Chronic obstructive pulmonary disease, unspecified: Secondary | ICD-10-CM | POA: Insufficient documentation

## 2014-05-17 DIAGNOSIS — R1110 Vomiting, unspecified: Secondary | ICD-10-CM | POA: Diagnosis not present

## 2014-05-17 DIAGNOSIS — E782 Mixed hyperlipidemia: Secondary | ICD-10-CM | POA: Diagnosis not present

## 2014-05-17 DIAGNOSIS — I10 Essential (primary) hypertension: Secondary | ICD-10-CM | POA: Insufficient documentation

## 2014-05-17 DIAGNOSIS — I252 Old myocardial infarction: Secondary | ICD-10-CM | POA: Insufficient documentation

## 2014-05-17 DIAGNOSIS — Z880 Allergy status to penicillin: Secondary | ICD-10-CM | POA: Insufficient documentation

## 2014-05-17 DIAGNOSIS — Z91040 Latex allergy status: Secondary | ICD-10-CM | POA: Insufficient documentation

## 2014-05-17 DIAGNOSIS — N390 Urinary tract infection, site not specified: Secondary | ICD-10-CM

## 2014-05-17 DIAGNOSIS — F411 Generalized anxiety disorder: Secondary | ICD-10-CM | POA: Diagnosis not present

## 2014-05-17 DIAGNOSIS — J4489 Other specified chronic obstructive pulmonary disease: Secondary | ICD-10-CM | POA: Insufficient documentation

## 2014-05-17 DIAGNOSIS — Z79899 Other long term (current) drug therapy: Secondary | ICD-10-CM | POA: Diagnosis not present

## 2014-05-17 DIAGNOSIS — E550 Rickets, active: Secondary | ICD-10-CM | POA: Insufficient documentation

## 2014-05-17 DIAGNOSIS — K219 Gastro-esophageal reflux disease without esophagitis: Secondary | ICD-10-CM | POA: Insufficient documentation

## 2014-05-17 LAB — CBC WITH DIFFERENTIAL/PLATELET
Basophils Absolute: 0.1 10*3/uL (ref 0.0–0.1)
Basophils Relative: 1 % (ref 0–1)
Eosinophils Absolute: 0.1 10*3/uL (ref 0.0–0.7)
Eosinophils Relative: 2 % (ref 0–5)
HEMATOCRIT: 36.7 % (ref 36.0–46.0)
HEMOGLOBIN: 11.7 g/dL — AB (ref 12.0–15.0)
LYMPHS PCT: 21 % (ref 12–46)
Lymphs Abs: 1.4 10*3/uL (ref 0.7–4.0)
MCH: 31.1 pg (ref 26.0–34.0)
MCHC: 31.9 g/dL (ref 30.0–36.0)
MCV: 97.6 fL (ref 78.0–100.0)
MONO ABS: 0.7 10*3/uL (ref 0.1–1.0)
MONOS PCT: 11 % (ref 3–12)
Neutro Abs: 4.3 10*3/uL (ref 1.7–7.7)
Neutrophils Relative %: 65 % (ref 43–77)
Platelets: 198 10*3/uL (ref 150–400)
RBC: 3.76 MIL/uL — ABNORMAL LOW (ref 3.87–5.11)
RDW: 13.4 % (ref 11.5–15.5)
WBC: 6.6 10*3/uL (ref 4.0–10.5)

## 2014-05-17 LAB — URINALYSIS, ROUTINE W REFLEX MICROSCOPIC
Bilirubin Urine: NEGATIVE
GLUCOSE, UA: NEGATIVE mg/dL
Hgb urine dipstick: NEGATIVE
KETONES UR: NEGATIVE mg/dL
LEUKOCYTES UA: NEGATIVE
Nitrite: NEGATIVE
PROTEIN: NEGATIVE mg/dL
Specific Gravity, Urine: >1.03 — ABNORMAL HIGH (ref 1.005–1.030)
Urobilinogen, UA: 0.2 mg/dL (ref 0.0–1.0)
pH: 5 (ref 5.0–8.0)

## 2014-05-17 LAB — COMPREHENSIVE METABOLIC PANEL
ALT: 7 U/L (ref 0–35)
AST: 15 U/L (ref 0–37)
Albumin: 3.6 g/dL (ref 3.5–5.2)
Alkaline Phosphatase: 60 U/L (ref 39–117)
Anion gap: 12 (ref 5–15)
BILIRUBIN TOTAL: 0.6 mg/dL (ref 0.3–1.2)
BUN: 18 mg/dL (ref 6–23)
CHLORIDE: 102 meq/L (ref 96–112)
CO2: 26 meq/L (ref 19–32)
CREATININE: 1 mg/dL (ref 0.50–1.10)
Calcium: 8.9 mg/dL (ref 8.4–10.5)
GFR calc Af Amer: 60 mL/min — ABNORMAL LOW (ref >90)
GFR calc non Af Amer: 51 mL/min — ABNORMAL LOW (ref >90)
Glucose, Bld: 129 mg/dL — ABNORMAL HIGH (ref 70–99)
Potassium: 4.2 mEq/L (ref 3.7–5.3)
Sodium: 140 mEq/L (ref 137–147)
Total Protein: 6.4 g/dL (ref 6.0–8.3)

## 2014-05-17 MED ORDER — SODIUM CHLORIDE 0.9 % IV BOLUS (SEPSIS)
500.0000 mL | Freq: Once | INTRAVENOUS | Status: AC
Start: 2014-05-17 — End: 2014-05-17
  Administered 2014-05-17: 500 mL via INTRAVENOUS

## 2014-05-17 MED ORDER — ONDANSETRON 4 MG PO TBDP: ORAL_TABLET | ORAL | Status: DC

## 2014-05-17 NOTE — ED Provider Notes (Signed)
CSN: [id number]     Arrival date & time 05/17/14  1236 History  This chart was scribed for [name], MD by Annye Asa, ED Scribe. This patient was seen in room APA11/APA11 and the patient's care was started at 2:19 PM.    Chief Complaint  Patient presents with  . Fatigue   Patient is a 80 y.o. female presenting with vomiting. The history is provided by the patient (pt being txed for uti and has n[place] with cipro). No language interpreter was used.  Emesis Severity:  Moderate Quality:  Bilious material Able to tolerate:  Liquids Progression:  Worsening Chronicity:  New Recent urination:  Normal Context: not self-induced   Associated symptoms: no abdominal pain, no diarrhea and no headaches     HPI Comments: [name] is a 80 y.o. female who presents to the Emergency Department complaining of nausea   Past Medical History  Diagnosis Date  . Coronary atherosclerosis of native coronary artery     DES LAD and BMS LAD 8/08  . Mixed hyperlipidemia   . GERD (gastroesophageal reflux disease)   . Essential hypertension, benign   . Anxiety   . Resting tremor   . COPD (chronic obstructive pulmonary disease)   . Rickets   . MI (myocardial infarction) 2008   Past Surgical History  Procedure Laterality Date  . Appendectomy    . Cholecystectomy    . Bilateral cataract surgery    . Vein ligation and stripping    . Cardiac catheterization    . Coronary angioplasty with stent placement    . Left trochanteric bursa injection  11/27/11    DR. O'TOOLE  . Left sacroiliac joint injection  11/27/11    DR. O'TOOLE  . Yag laser application  09/06/2012    Procedure: YAG LASER APPLICATION;  Surgeon: Susa Simmonds, MD;  Location: [facility];  Service: Ophthalmology;  Laterality: Left;   Family History  Problem Relation Age of Onset  . Hypertension     History  Substance Use Topics  . Smoking status: Never Smoker   . Smokeless tobacco: Never Used  . Alcohol Use: No   OB  History   Grav Para Term Preterm Abortions TAB SAB Ect Mult Living   Review of Systems  Constitutional: Negative for appetite change and fatigue.  HENT: Negative for congestion, ear discharge and sinus pressure.   Eyes: Negative for discharge.  Respiratory: Negative for cough.   Cardiovascular: Negative for chest pain.  Gastrointestinal: Positive for vomiting. Negative for abdominal pain and diarrhea.  Genitourinary: Negative for frequency and hematuria.  Musculoskeletal: Negative for back pain.  Skin: Negative for rash.  Neurological: Negative for seizures and headaches.  Psychiatric/Behavioral: Negative for hallucinations.      Allergies  Azithromycin; Cephalexin; Ciprofloxacin; Codeine; Contrast media; Latex; Lorazepam; Nitrofuran derivatives; Nsaids; Nutritional supplements; Penicillins; Phenazopyridine; Quinolones; Sulfa antibiotics; Toviaz; and Trimethoprim  Home Medications   Prior to Admission medications   Medication Sig Start Date End Date Taking? Authorizing Provider  acetaminophen (TYLENOL) 500 MG tablet Take 500 mg by mouth every 6 (six) hours as needed. pain   Yes Historical Provider, MD  albuterol (PROVENTIL) (2.5 MG/3ML) 0.083% nebulizer solution Take 3 mLs (2.5 mg total) by nebulization every 6 (six) hours as needed for wheezing or shortness of breath. 01/10/14  Yes Deatra Canter, FNP  atorvastatin (LIPITOR) 20 MG tablet Take 1 tablet (20 mg total)  by mouth daily at 6 PM. 11/09/13  Yes Deatra Canter, FNP  calcium citrate-vitamin D (CITRACAL+D) 315-200 MG-UNIT per tablet Take 1 tablet by mouth daily.   Yes Historical Provider, MD  cholecalciferol (VITAMIN D) 1000 UNITS tablet Take 4,000 Units by mouth daily.   Yes Historical Provider, MD  clopidogrel (PLAVIX) 75 MG tablet TAKE 1 TABLET BY MOUTH DAILY 05/09/14  Yes Jonelle Sidle, MD  ESTRACE VAGINAL 0.1 MG/GM vaginal cream Place 1 Applicatorful vaginally 3 (three) times a week.  07/22/13  Yes  Historical Provider, MD  isosorbide mononitrate (IMDUR) 30 MG 24 hr tablet TAKE 1 TABLET BY MOUTH TWICE DAILY 04/07/14  Yes Jonelle Sidle, MD  lisinopril (PRINIVIL,ZESTRIL) 10 MG tablet TAKE 1 TABLET BY MOUTH EVERY DAY 05/09/14  Yes Jonelle Sidle, MD  LORazepam (ATIVAN) 0.5 MG tablet TAKE 1 TABLET BY MOUTH TWICE DAILY 05/01/14  Yes Junie Spencer, FNP  nitroGLYCERIN (NITROSTAT) 0.4 MG SL tablet Place 1 tablet (0.4 mg total) under the tongue every 5 (five) minutes as needed. 09/10/12  Yes Jonelle Sidle, MD  ondansetron (ZOFRAN) 8 MG tablet Take 1 tablet (8 mg total) by mouth every 4 (four) hours as needed for nausea or vomiting. 09/17/13  Yes Donnetta Hutching, MD  pantoprazole (PROTONIX) 40 MG tablet TAKE 1 TABLET BY MOUTH EVERY DAY AT NOON 12/12/13  Yes Jonelle Sidle, MD   BP 120/67  Pulse 72  Temp(Src) 98.8 F (37.1 C) (Oral)  Resp 18  Ht  (1.626 m)  Wt 150 lb (68.04 kg)  BMI 25.73 kg/m2  SpO2 99% Physical Exam  Constitutional: She is oriented to person, place, and time. She appears well-developed.  HENT:  Head: Normocephalic.  Eyes: Conjunctivae and EOM are normal. No scleral icterus.  Neck: Neck supple. No thyromegaly present.  Cardiovascular: Normal rate and regular rhythm.  Exam reveals no gallop and no friction rub.   No murmur heard. Pulmonary/Chest: No stridor. She has no wheezes. She has no rales. She exhibits no tenderness.  Abdominal: She exhibits no distension. There is no tenderness. There is no rebound.  Musculoskeletal: Normal range of motion. She exhibits no edema.  Lymphadenopathy:    She has no cervical adenopathy.  Neurological: She is oriented to person, place, and time. She exhibits normal muscle tone. Coordination normal.  Skin: No rash noted. No erythema.  Psychiatric: She has a normal mood and affect. Her behavior is normal.    ED Course  Procedures (including critical care time)  DIAGNOSTIC STUDIES: Oxygen Saturation is 99% on RA, normal by my  interpretation.    COORDINATION OF CARE:    Labs Review Labs Reviewed  CBC WITH DIFFERENTIAL  COMPREHENSIVE METABOLIC PANEL  URINALYSIS, ROUTINE W REFLEX MICROSCOPIC    Imaging Review No results found.   EKG Interpretation None      MDM  uti will continue cipro and start zofran Final diagnoses:  None   The chart was scribed for me under my direct supervision.  I personally performed the history, physical, and medical decision making and all procedures in the evaluation of this patient.[name], MD 05/17/14 [phone]

## 2014-05-17 NOTE — Discharge Instructions (Signed)
Take cipro 1/2 of a pill twice a day until next Tuesday,   Follow up with your md next week.

## 2014-05-17 NOTE — ED Notes (Addendum)
Pt states generalized weakness. Dx with pneumonia at [facility] on 9/3 and physician called last night stating pt has a UTI. Cipro prescribed but states that the medication is making her nauseated. Cipro is on allergy list

## 2014-05-18 LAB — URINE CULTURE

## 2014-05-22 ENCOUNTER — Telehealth: Payer: Self-pay | Admitting: Family Medicine

## 2014-05-22 NOTE — Telephone Encounter (Signed)
Appt given

## 2014-05-23 ENCOUNTER — Encounter: Payer: Self-pay | Admitting: *Deleted

## 2014-05-23 NOTE — Telephone Encounter (Signed)
This encounter was created in error - please disregard.

## 2014-05-25 ENCOUNTER — Ambulatory Visit (INDEPENDENT_AMBULATORY_CARE_PROVIDER_SITE_OTHER): Payer: Medicare HMO | Admitting: Family

## 2014-05-25 ENCOUNTER — Encounter: Payer: Self-pay | Admitting: Family

## 2014-05-25 VITALS — BP 144/58 | HR 67 | Temp 98.7°F | Ht 64.0 in | Wt 148.0 lb

## 2014-05-25 DIAGNOSIS — F411 Generalized anxiety disorder: Secondary | ICD-10-CM

## 2014-05-25 DIAGNOSIS — N390 Urinary tract infection, site not specified: Secondary | ICD-10-CM

## 2014-05-25 DIAGNOSIS — Z09 Encounter for follow-up examination after completed treatment for conditions other than malignant neoplasm: Secondary | ICD-10-CM

## 2014-05-25 DIAGNOSIS — F419 Anxiety disorder, unspecified: Secondary | ICD-10-CM

## 2014-05-25 LAB — POCT UA - MICROSCOPIC ONLY
CASTS, UR, LPF, POC: NEGATIVE
Crystals, Ur, HPF, POC: NEGATIVE
MUCUS UA: NEGATIVE
YEAST UA: NEGATIVE

## 2014-05-25 LAB — POCT URINALYSIS DIPSTICK
Bilirubin, UA: NEGATIVE
GLUCOSE UA: NEGATIVE
Ketones, UA: NEGATIVE
LEUKOCYTES UA: NEGATIVE
NITRITE UA: NEGATIVE
PH UA: 5
Protein, UA: NEGATIVE
Spec Grav, UA: 1.015
Urobilinogen, UA: NEGATIVE

## 2014-05-25 MED ORDER — LORAZEPAM 0.5 MG PO TABS: ORAL_TABLET | ORAL | Status: DC

## 2014-05-25 NOTE — Progress Notes (Signed)
   Subjective:    Patient ID: [name], female    DOB: [birth year], 80 y.o.   MRN: [id number]  HPI Pt presents to the office for hospital follow-up for nausea and weakness. Pt was treated for a UTI. PT states she is still burning. Pt states she completed a coarse of cipro. Pt states her weakness better.   *I reviewed ED notes from hospital  Review of Systems  Constitutional: Negative.   HENT: Negative.   Eyes: Negative.   Respiratory: Negative.  Negative for shortness of breath.   Cardiovascular: Negative.  Negative for palpitations.  Gastrointestinal: Negative.   Endocrine: Negative.   Genitourinary: Negative.   Musculoskeletal: Negative.   Neurological: Negative.  Negative for headaches.  Hematological: Negative.   Psychiatric/Behavioral: Negative.   All other systems reviewed and are negative.      Objective:   Physical Exam  Vitals reviewed. Constitutional: She is oriented to person, place, and time. She appears well-developed and well-nourished. No distress.  Cardiovascular: Normal rate, regular rhythm, normal heart sounds and intact distal pulses.   No murmur heard. Pulmonary/Chest: Effort normal and breath sounds normal. No respiratory distress. She has no wheezes.  Abdominal: Soft. Bowel sounds are normal. She exhibits no distension. There is no tenderness.  Musculoskeletal: Normal range of motion. She exhibits no edema and no tenderness.  Negative CVA tenderness  Neurological: She is alert and oriented to person, place, and time. She has normal reflexes. No cranial nerve deficit.  Skin: Skin is warm and dry.  Psychiatric: She has a normal mood and affect. Her behavior is normal. Judgment and thought content normal.     BP 144/58  Pulse 67  Temp(Src) 98.7 F (37.1 C) (Oral)  Ht  (1.626 m)  Wt 148 lb (67.132 kg)  BMI 25.39 kg/m2      Assessment & Plan:  1. Hospital discharge follow-up - POCT UA - Microscopic Only - POCT urinalysis dipstick -  Urine culture  2. Anxiety - LORazepam (ATIVAN) 0.5 MG tablet; TAKE 1 TABLET BY MOUTH TWICE DAILY  Dispense: 60 tablet; Refill: 0  Will call if UTI is still present RTO prn  Jannifer Rodney, FNP

## 2014-05-25 NOTE — Patient Instructions (Signed)
Asymptomatic Bacteriuria Asymptomatic bacteriuria is the presence of a large number of bacteria in your urine without the usual symptoms of burning or frequent urination. The following conditions increase the risk of asymptomatic bacteriuria:  Diabetes mellitus.  Advanced age.  Pregnancy in the first trimester.  Kidney stones.  Kidney transplants.  Leaky kidney tube valve in young children (reflux). Treatment for this condition is not needed in most people and can lead to other problems such as too much yeast and growth of resistant bacteria. However, some people, such as pregnant women, do need treatment to prevent kidney infection. Asymptomatic bacteriuria in pregnancy is also associated with fetal growth restriction, premature labor, and newborn death. HOME CARE INSTRUCTIONS Monitor your condition for any changes. The following actions may help to relieve any discomfort you are feeling:  Drink enough water and fluids to keep your urine clear or pale yellow. Go to the bathroom more often to keep your bladder empty.  Keep the area around your vagina and rectum clean. Wipe yourself from front to back after urinating. SEEK IMMEDIATE MEDICAL CARE IF:  You develop signs of an infection such as:  Burning with urination.  Frequency of voiding.  Back pain.  Fever.  You have blood in the urine.  You develop a fever. MAKE SURE YOU:  Understand these instructions.  Will watch your condition.  Will get help right away if you are not doing well or get worse. Document Released: 08/18/2005 Document Revised: 01/02/2014 Document Reviewed: 02/07/2013 Exit[employer] Patient Information 2015 [employer]. This information is not intended to replace advice given to you by your health care provider. Make sure you discuss any questions you have with your health care provider.  

## 2014-05-27 LAB — URINE CULTURE

## 2014-06-12 ENCOUNTER — Encounter (HOSPITAL_COMMUNITY): Payer: Self-pay | Admitting: Emergency Medicine

## 2014-06-12 ENCOUNTER — Emergency Department (HOSPITAL_COMMUNITY)
Admission: EM | Admit: 2014-06-12 | Discharge: 2014-06-13 | Disposition: A | Discharge status: Home / Self Care | Payer: Medicare HMO | Attending: Emergency Medicine | Admitting: Emergency Medicine

## 2014-06-12 ENCOUNTER — Emergency Department (HOSPITAL_COMMUNITY): Payer: Medicare HMO

## 2014-06-12 DIAGNOSIS — I252 Old myocardial infarction: Secondary | ICD-10-CM | POA: Insufficient documentation

## 2014-06-12 DIAGNOSIS — R0602 Shortness of breath: Secondary | ICD-10-CM

## 2014-06-12 DIAGNOSIS — Z7902 Long term (current) use of antithrombotics/antiplatelets: Secondary | ICD-10-CM | POA: Insufficient documentation

## 2014-06-12 DIAGNOSIS — E785 Hyperlipidemia, unspecified: Secondary | ICD-10-CM | POA: Insufficient documentation

## 2014-06-12 DIAGNOSIS — K219 Gastro-esophageal reflux disease without esophagitis: Secondary | ICD-10-CM | POA: Insufficient documentation

## 2014-06-12 DIAGNOSIS — J441 Chronic obstructive pulmonary disease with (acute) exacerbation: Secondary | ICD-10-CM | POA: Diagnosis not present

## 2014-06-12 DIAGNOSIS — I2510 Atherosclerotic heart disease of native coronary artery without angina pectoris: Secondary | ICD-10-CM | POA: Diagnosis not present

## 2014-06-12 DIAGNOSIS — F419 Anxiety disorder, unspecified: Secondary | ICD-10-CM | POA: Diagnosis not present

## 2014-06-12 DIAGNOSIS — E550 Rickets, active: Secondary | ICD-10-CM | POA: Insufficient documentation

## 2014-06-12 DIAGNOSIS — I10 Essential (primary) hypertension: Secondary | ICD-10-CM | POA: Diagnosis not present

## 2014-06-12 DIAGNOSIS — Z79899 Other long term (current) drug therapy: Secondary | ICD-10-CM | POA: Diagnosis not present

## 2014-06-12 DIAGNOSIS — R05 Cough: Secondary | ICD-10-CM | POA: Diagnosis present

## 2014-06-12 LAB — BASIC METABOLIC PANEL
Anion gap: 12 (ref 5–15)
BUN: 18 mg/dL (ref 6–23)
CO2: 25 mEq/L (ref 19–32)
CREATININE: 0.93 mg/dL (ref 0.50–1.10)
Calcium: 9 mg/dL (ref 8.4–10.5)
Chloride: 102 mEq/L (ref 96–112)
GFR, EST AFRICAN AMERICAN: 65 mL/min — AB (ref >90)
GFR, EST NON AFRICAN AMERICAN: 56 mL/min — AB (ref >90)
Glucose, Bld: 145 mg/dL — ABNORMAL HIGH (ref 70–99)
Potassium: 3.8 mEq/L (ref 3.7–5.3)
Sodium: 139 mEq/L (ref 137–147)

## 2014-06-12 LAB — CBC
HEMATOCRIT: 38 % (ref 36.0–46.0)
Hemoglobin: 12.5 g/dL (ref 12.0–15.0)
MCH: 30.6 pg (ref 26.0–34.0)
MCHC: 32.9 g/dL (ref 30.0–36.0)
MCV: 92.9 fL (ref 78.0–100.0)
Platelets: 144 10*3/uL — ABNORMAL LOW (ref 150–400)
RBC: 4.09 MIL/uL (ref 3.87–5.11)
RDW: 13.7 % (ref 11.5–15.5)
WBC: 6.2 10*3/uL (ref 4.0–10.5)

## 2014-06-12 MED ORDER — IPRATROPIUM-ALBUTEROL 0.5-2.5 (3) MG/3ML IN SOLN
3.0000 mL | RESPIRATORY_TRACT | Status: DC
  Administered 2014-06-12: 3 mL via RESPIRATORY_TRACT
  Filled 2014-06-12: qty 3

## 2014-06-12 NOTE — ED Provider Notes (Signed)
CSN: [id number]     Arrival date & time 06/12/14  2007 History  This chart was scribed for Hanley SeamenJohn L Jaloni Sorber, MD by Gwenyth Oberatherine Macek, ED Scribe. This patient was seen in room APA18/APA18 and the patient's care was started at 11:09 PM.     Chief Complaint  Patient presents with  . Cough   The history is provided by the patient. No language interpreter was used.   HPI Comments: Suzanne MillinKatherine [name] is a 80 y.o. female who presents to the Emergency Department complaining of new onset SOB and productive cough with white sputum that started 5 hours ago. Pt has subjective fever and redness in the face as associated symptoms. She denies CP, nausea, vomiting, and diarrhea as associated symptoms. Pt does not currently have an inhaler. Patient's symptoms have improved significantly after coughing up a lot of phlegm while in the ED.   Past Medical History  Diagnosis Date  . Coronary atherosclerosis of native coronary artery     DES LAD and BMS LAD 8/08  . Mixed hyperlipidemia   . GERD (gastroesophageal reflux disease)   . Essential hypertension, benign   . Anxiety   . Resting tremor   . COPD (chronic obstructive pulmonary disease)   . Rickets   . MI (myocardial infarction) 2008   Past Surgical History  Procedure Laterality Date  . Appendectomy    . Cholecystectomy    . Bilateral cataract surgery    . Vein ligation and stripping    . Cardiac catheterization    . Coronary angioplasty with stent placement    . Left trochanteric bursa injection  11/27/11    DR. O'TOOLE  . Left sacroiliac joint injection  11/27/11    DR. O'TOOLE  . Yag laser application  09/06/2012    Procedure: YAG LASER APPLICATION;  Surgeon: Susa Simmondsarroll F Haines, MD;  Location: [facility];  Service: Ophthalmology;  Laterality: Left;   Family History  Problem Relation Age of Onset  . Hypertension     History  Substance Use Topics  . Smoking status: Never Smoker   . Smokeless tobacco: Never Used  . Alcohol Use: No   OB History   Grav  Para Term Preterm Abortions TAB SAB Ect Mult Living   4 4 4       4      Review of Systems  Constitutional: Positive for fever.  Respiratory: Positive for cough and shortness of breath.   Cardiovascular: Negative for chest pain.  Gastrointestinal: Negative for nausea, vomiting and diarrhea.    A complete 10 system review of systems was obtained and all systems are negative except as noted in the HPI and PMH.   Allergies  Azithromycin; Cephalexin; Ciprofloxacin; Codeine; Contrast media; Latex; Nitrofuran derivatives; Nsaids; Nutritional supplements; Penicillins; Phenazopyridine; Quinolones; Sulfa antibiotics; Toviaz; and Trimethoprim  Home Medications   Prior to Admission medications   Medication Sig Start Date End Date Taking? Authorizing Provider  acetaminophen (TYLENOL) 500 MG tablet Take 500 mg by mouth every 6 (six) hours as needed. pain    Historical Provider, MD  albuterol (PROVENTIL) (2.5 MG/3ML) 0.083% nebulizer solution Take 3 mLs (2.5 mg total) by nebulization every 6 (six) hours as needed for wheezing or shortness of breath. 01/10/14   Deatra CanterWilliam J Oxford, FNP  atorvastatin (LIPITOR) 20 MG tablet Take 1 tablet (20 mg total) by mouth daily at 6 PM. 11/09/13   Deatra CanterWilliam J Oxford, FNP  calcium citrate-vitamin D (CITRACAL+D) 315-200 MG-UNIT per tablet Take 1 tablet by mouth daily.  Historical Provider, MD  cholecalciferol (VITAMIN D) 1000 UNITS tablet Take 4,000 Units by mouth daily.    Historical Provider, MD  clopidogrel (PLAVIX) 75 MG tablet TAKE 1 TABLET BY MOUTH DAILY 05/09/14   Jonelle Sidle, MD  ESTRACE VAGINAL 0.1 MG/GM vaginal cream Place 1 Applicatorful vaginally 3 (three) times a week.  07/22/13   Historical Provider, MD  isosorbide mononitrate (IMDUR) 30 MG 24 hr tablet TAKE 1 TABLET BY MOUTH TWICE DAILY 04/07/14   Jonelle Sidle, MD  lisinopril (PRINIVIL,ZESTRIL) 10 MG tablet TAKE 1 TABLET BY MOUTH EVERY DAY 05/09/14   Jonelle Sidle, MD  LORazepam (ATIVAN) 0.5 MG  tablet TAKE 1 TABLET BY MOUTH TWICE DAILY 05/25/14   Junie Spencer, FNP  nitroGLYCERIN (NITROSTAT) 0.4 MG SL tablet Place 1 tablet (0.4 mg total) under the tongue every 5 (five) minutes as needed. 09/10/12   Jonelle Sidle, MD  ondansetron (ZOFRAN ODT) 4 MG disintegrating tablet 4mg  ODT q4 hours prn nausea/vomit 05/17/14   [name], MD  ondansetron (ZOFRAN) 8 MG tablet Take 1 tablet (8 mg total) by mouth every 4 (four) hours as needed for nausea or vomiting. 09/17/13   Donnetta Hutching, MD  pantoprazole (PROTONIX) 40 MG tablet TAKE 1 TABLET BY MOUTH EVERY DAY AT [facility] 12/12/13   Jonelle Sidle, MD   BP 120/82  Pulse 102  Temp(Src) 98.3 F (36.8 C) (Oral)  Resp 20  Ht 5\' 4"  (1.626 m)  Wt 150 lb (68.04 kg)  BMI 25.73 kg/m2  SpO2 99% Physical Exam  Nursing note and vitals reviewed.   General: Well-developed, well-nourished female in no acute distress; appearance consistent with age of record HENT: normocephalic; atraumatic, edentulous and pharynx looks normal Eyes: pupils equal, round and reactive to light; unable to cooperate for extraocular muscles exam, lens implants, arcus senilis  Neck: supple  Heart: regular rate and rhythm; no murmur Lungs: Course sounds bilaterally Abdomen: soft; nondistended; nontender; no masses or hepatosplenomegaly; bowel sounds present Extremities: No deformity; full range of motion; pulses normal Neurologic: Awake, alert and oriented; motor function intact in all extremities and symmetric; no facial droop Skin: Warm and dry Psychiatric: Normal mood and affect   ED Course  Procedures (including critical care time) DIAGNOSTIC STUDIES: Oxygen Saturation is 99% on RA, normal by my interpretation.    COORDINATION OF CARE: 11:14 PM Discussed treatment plan with pt at bedside and pt agreed to plan.   MDM   Nursing notes and vitals signs, including pulse oximetry, reviewed.  Summary of this visit's results, reviewed by myself:  Labs:  Results for  orders placed during the hospital encounter of 06/12/14 (from the past 24 hour(s))  CBC     Status: Abnormal   Collection Time    06/12/14 10:53 PM      Result Value Ref Range   WBC 6.2  4.0 - 10.5 K/uL   RBC 4.09  3.87 - 5.11 MIL/uL   Hemoglobin 12.5  12.0 - 15.0 g/dL   HCT 16.1  09.6 - 04.5 %   MCV 92.9  78.0 - 100.0 fL   MCH 30.6  26.0 - 34.0 pg   MCHC 32.9  30.0 - 36.0 g/dL   RDW 40.9  81.1 - 91.4 %   Platelets 144 (*) 150 - 400 K/uL  BASIC METABOLIC PANEL     Status: Abnormal   Collection Time    06/12/14 10:53 PM      Result Value Ref Range  Sodium 139  137 - 147 mEq/L   Potassium 3.8  3.7 - 5.3 mEq/L   Chloride 102  96 - 112 mEq/L   CO2 25  19 - 32 mEq/L   Glucose, Bld 145 (*) 70 - 99 mg/dL   BUN 18  6 - 23 mg/dL   Creatinine, Ser 4.090.93  0.50 - 1.10 mg/dL   Calcium 9.0  8.4 - 81.110.5 mg/dL   GFR calc non Af Amer 56 (*) >90 mL/min   GFR calc Af Amer 65 (*) >90 mL/min   Anion gap 12  5 - 15    Imaging Studies: Dg Chest Port 1 View  06/12/2014   CLINICAL DATA:  Productive cough with white sputum, weakness and shortness of breath. Former smoker. Initial encounter.  EXAM: PORTABLE CHEST - 1 VIEW  COMPARISON:  05/04/2014; 02/18/2014; 06/05/2012  FINDINGS: Grossly unchanged cardiac silhouette and mediastinal contours with atherosclerotic plaque within a tortuous thoracic aorta. There is unchanged thickening of the right paratracheal stripe presumably secondary to prominent vasculature. There is unchanged mild diffuse slightly nodular thickening of the pulmonary interstitium. Unchanged punctate (approximately 0.5 cm) granuloma within the right upper lung is unchanged since the 06/2012 examination. Bibasilar linear heterogeneous opacities are unchanged. No new focal airspace opacities. No pleural effusion or pneumothorax. No evidence of edema. No acute osseus abnormalities.  IMPRESSION: Bronchitic change and bibasilar atelectasis without acute cardiopulmonary disease.   Electronically  Signed   By: Simonne ComeJohn  Watts M.D.   On: 06/12/2014 22:15   12:21 AM Air movement improved after DuoNeb treatment. We will start her on doxycycline as a precaution. She is taking doxycycline in the past.   Final diagnoses:  COPD exacerbation   I personally performed the services described in this documentation, which was scribed in my presence. The recorded information has been reviewed and is accurate.   Hanley SeamenJohn L Merit Maybee, MD 06/13/14 [phone]

## 2014-06-12 NOTE — ED Notes (Signed)
Pt son, Gertie BaronCharlie Cannon, called to give contact number. [phone].

## 2014-06-12 NOTE — ED Notes (Signed)
Patient complaining of cough and thick mucus that she is unable to cough up. Patient gagging in triage. Patient also reports generalized weakness and shortness of breath.

## 2014-06-13 DIAGNOSIS — J441 Chronic obstructive pulmonary disease with (acute) exacerbation: Secondary | ICD-10-CM | POA: Diagnosis not present

## 2014-06-13 MED ORDER — ALBUTEROL SULFATE HFA 108 (90 BASE) MCG/ACT IN AERS
2.0000 | INHALATION_SPRAY | RESPIRATORY_TRACT | Status: DC | PRN
  Administered 2014-06-13: 2 via RESPIRATORY_TRACT
  Filled 2014-06-13: qty 6.7

## 2014-06-13 MED ORDER — DOXYCYCLINE HYCLATE 100 MG PO TABS
ORAL_TABLET | ORAL | Status: AC
  Filled 2014-06-13: qty 1

## 2014-06-13 MED ORDER — DOXYCYCLINE HYCLATE 100 MG IV SOLR
100.0000 mg | Freq: Once | INTRAVENOUS | Status: DC
  Filled 2014-06-13: qty 100

## 2014-06-13 MED ORDER — DOXYCYCLINE HYCLATE 100 MG PO TABS
100.0000 mg | ORAL_TABLET | Freq: Once | ORAL | Status: AC
  Administered 2014-06-13: 100 mg via ORAL

## 2014-06-13 MED ORDER — DOXYCYCLINE HYCLATE 100 MG PO CAPS: 100.0000 mg | ORAL_CAPSULE | Freq: Two times a day (BID) | ORAL | Status: DC

## 2014-06-13 MED ORDER — ACETAMINOPHEN 500 MG PO TABS
1000.0000 mg | ORAL_TABLET | Freq: Once | ORAL | Status: AC
  Administered 2014-06-13: 1000 mg via ORAL
  Filled 2014-06-13: qty 2

## 2014-06-13 NOTE — ED Notes (Signed)
Flu shot last month; pneumonia shot last year.

## 2014-06-14 ENCOUNTER — Telehealth: Payer: Self-pay

## 2014-06-14 NOTE — Telephone Encounter (Signed)
Nothing further needed at this time.  Pt will call back if she decides she would like to schedule an appointment.  [name]

## 2014-06-14 NOTE — Telephone Encounter (Signed)
Message copied by Antionette FairyPRYOR, HOLLY D on Wed Jun 14, 2014  2:32 PM ------      Message from: Hanley SeamenMOLPUS, JOHN L      Created: Tue Jun 13, 2014 12:26 AM      Regarding: Order for Zuch,Miquel             Patient Name: [name],[name]([id number])      Sex: Female      DOB: 09/30/1930              PCP: Ernestina PennaMOORE, DONALD W        [facility]             Types of orders made on 06/13/2014: Consult, Medications, Nursing            Order Date:06/13/2014      Ordering User:MOLPUS, Carlisle BeersJOHN L [[id number]]      Attending Provider:John Luna KitchensL Molpus, MD [phone][[id number]]      Authorizing Provider: Hanley SeamenJohn L Molpus, MD [[id number]]      Department:[facility][[id number]][id number]]            Order Specific Information      Order: COPD clinic follow up [Custom: CON4000]  Order #: [id number]  Qty: 1        Priority: Routine  Class: Hospital Performed          Where does the patient receive follow up COPD care -> [facility]                 Follow up in -> 5-7 days               Released on: 06/13/2014 12:26 AM                    Priority: Routine  Class: Hospital Performed          Where does the patient receive follow up COPD care -> [facility]                 Follow up in -> 5-7 days               Released on: 06/13/2014 12:26 AM       ------

## 2014-06-17 ENCOUNTER — Telehealth: Payer: Self-pay | Admitting: Family

## 2014-06-19 ENCOUNTER — Encounter: Payer: Self-pay | Admitting: Family Medicine

## 2014-06-19 ENCOUNTER — Ambulatory Visit (INDEPENDENT_AMBULATORY_CARE_PROVIDER_SITE_OTHER): Payer: Medicare HMO | Admitting: Family Medicine

## 2014-06-19 VITALS — BP 159/72 | HR 73 | Temp 97.7°F | Ht 64.0 in | Wt 148.0 lb

## 2014-06-19 DIAGNOSIS — F419 Anxiety disorder, unspecified: Secondary | ICD-10-CM

## 2014-06-19 DIAGNOSIS — J441 Chronic obstructive pulmonary disease with (acute) exacerbation: Secondary | ICD-10-CM

## 2014-06-19 MED ORDER — PREDNISONE 10 MG PO TABS: ORAL_TABLET | ORAL | Status: DC

## 2014-06-19 MED ORDER — DOXYCYCLINE HYCLATE 100 MG PO CAPS: 100.0000 mg | ORAL_CAPSULE | Freq: Two times a day (BID) | ORAL | Status: DC

## 2014-06-19 MED ORDER — LORAZEPAM 0.5 MG PO TABS: ORAL_TABLET | ORAL | Status: DC

## 2014-06-19 NOTE — Telephone Encounter (Signed)
appt made

## 2014-06-19 NOTE — Progress Notes (Signed)
   Subjective:    Patient ID: Suzanne MillinKatherine [name], female    DOB: [birth year], 80 y.o.   MRN: [id number]  HPI This 80 y.o. female presents for evaluation of c/o uri sx's and cough.   Review of Systems No chest pain, SOB, HA, dizziness, vision change, N/V, diarrhea, constipation, dysuria, urinary urgency or frequency, myalgias, arthralgias or rash.     Objective:   Physical Exam  Vital signs noted  Well developed well nourished female.  HEENT - Head atraumatic Normocephalic                Eyes - PERRLA, Conjuctiva - clear Sclera- Clear EOMI                Ears - EAC's Wnl TM's Wnl Gross Hearing WNL                Throat - oropharanx wnl Respiratory - Lungs with expiratory wheezes Cardiac - RRR S1 and S2 without murmur GI - Abdomen soft Nontender and bowel sounds active x 4 Extremities - No edema. Neuro - Grossly intact.      Assessment & Plan:  Anxiety - Plan: LORazepam (ATIVAN) 0.5 MG tablet, DISCONTINUED: LORazepam (ATIVAN) 0.5 MG tablet  COPD exacerbation - Plan: predniSONE (DELTASONE) 10 MG tablet, doxycycline (VIBRAMYCIN) 100 MG capsule, DISCONTINUED: doxycycline (VIBRAMYCIN) 100 MG capsule  Deatra CanterWilliam J Kalieb Freeland FNP

## 2014-06-27 ENCOUNTER — Telehealth: Payer: Self-pay | Admitting: Family

## 2014-06-27 NOTE — Telephone Encounter (Signed)
Pt given appt tomorrow w/bill oxford @ 8:45

## 2014-06-28 ENCOUNTER — Encounter: Payer: Self-pay | Admitting: Family Medicine

## 2014-06-28 ENCOUNTER — Ambulatory Visit (INDEPENDENT_AMBULATORY_CARE_PROVIDER_SITE_OTHER): Payer: Medicare HMO | Admitting: Family Medicine

## 2014-06-28 VITALS — BP 118/64 | HR 61 | Temp 97.3°F | Ht 63.0 in | Wt 148.2 lb

## 2014-06-28 DIAGNOSIS — J206 Acute bronchitis due to rhinovirus: Secondary | ICD-10-CM

## 2014-06-28 MED ORDER — ALBUTEROL SULFATE HFA 108 (90 BASE) MCG/ACT IN AERS: 2.0000 | INHALATION_SPRAY | Freq: Four times a day (QID) | RESPIRATORY_TRACT | Status: DC | PRN

## 2014-06-28 NOTE — Progress Notes (Signed)
   Subjective:    Patient ID: Suzanne MillinKatherine [name], female    DOB: Sep 05, 1930, 80 y.o.   MRN: [id number]  HPI This 80 y.o. female presents for evaluation of follow up on bronchitis.  She was unable to tolerate prednisone and had anxiety and worsening tremors so she stopped.  She is still having some cough. She is still taking doxycycline.   Review of Systems C/o cough   No chest pain, SOB, HA, dizziness, vision change, N/V, diarrhea, constipation, dysuria, urinary urgency or frequency, myalgias, arthralgias or rash.  Objective:   Physical Exam  Vital signs noted  Well developed well nourished female.  HEENT - Head atraumatic Normocephalic                Eyes - PERRLA, Conjuctiva - clear Sclera- Clear EOMI                Ears - EAC's Wnl TM's Wnl Gross Hearing WNL                Nose - Nares patent                 Throat - oropharanx wnl Respiratory - Lungs CTA bilateral Cardiac - RRR S1 and S2 without murmur GI - Abdomen soft Nontender and bowel sounds active x 4 Extremities - No edema. Neuro - Grossly intact.      Assessment & Plan:  Acute bronchitis due to Rhinovirus - Plan: albuterol (PROVENTIL HFA;VENTOLIN HFA) 108 (90 BASE) MCG/ACT inhaler Use inhaler since this has worked in past.  Take otc childrens cough medicine that she used in past that she has no SE's from.  Follow up prn.  Deatra CanterWilliam J Claxton Levitz FNP

## 2014-07-03 ENCOUNTER — Encounter: Payer: Self-pay | Admitting: Family Medicine

## 2014-07-07 ENCOUNTER — Other Ambulatory Visit: Payer: Self-pay | Admitting: *Deleted

## 2014-07-07 MED ORDER — PANTOPRAZOLE SODIUM 40 MG PO TBEC: DELAYED_RELEASE_TABLET | ORAL | Status: DC

## 2014-08-01 ENCOUNTER — Encounter: Payer: Self-pay | Admitting: Family Medicine

## 2014-08-01 ENCOUNTER — Ambulatory Visit (INDEPENDENT_AMBULATORY_CARE_PROVIDER_SITE_OTHER): Payer: Medicare HMO | Admitting: Family Medicine

## 2014-08-01 VITALS — BP 134/70 | HR 76 | Temp 97.5°F | Ht 63.0 in | Wt 147.6 lb

## 2014-08-01 DIAGNOSIS — N390 Urinary tract infection, site not specified: Secondary | ICD-10-CM

## 2014-08-01 DIAGNOSIS — R531 Weakness: Secondary | ICD-10-CM

## 2014-08-01 DIAGNOSIS — S329XXA Fracture of unspecified parts of lumbosacral spine and pelvis, initial encounter for closed fracture: Secondary | ICD-10-CM

## 2014-08-01 HISTORY — DX: Fracture of unspecified parts of lumbosacral spine and pelvis, initial encounter for closed fracture: S32.9XXA

## 2014-08-01 LAB — POCT UA - MICROSCOPIC ONLY
Casts, Ur, LPF, POC: NEGATIVE
Crystals, Ur, HPF, POC: NEGATIVE
Mucus, UA: NEGATIVE
Yeast, UA: NEGATIVE

## 2014-08-01 LAB — POCT URINALYSIS DIPSTICK
Bilirubin, UA: NEGATIVE
Glucose, UA: NEGATIVE
Ketones, UA: NEGATIVE
Nitrite, UA: NEGATIVE
Spec Grav, UA: 1.025
Urobilinogen, UA: NEGATIVE
pH, UA: 5

## 2014-08-01 LAB — GLUCOSE, POCT (MANUAL RESULT ENTRY): POC Glucose: 94 mg/dL (ref 70–99)

## 2014-08-01 MED ORDER — AMOXICILLIN 875 MG PO TABS: 875.0000 mg | ORAL_TABLET | Freq: Two times a day (BID) | ORAL | Status: DC

## 2014-08-01 NOTE — Addendum Note (Signed)
Addended by: Tommas OlpHANDY, [name] N on: 08/01/2014 06:08 PM   Modules accepted: Orders

## 2014-08-01 NOTE — Progress Notes (Signed)
   Subjective:    Patient ID: [name], female    DOB: [birth year], 80 y.o.   MRN: [id number]  HPI Patient presents with c/o fatigue and weakness.  She states she feels weak on her legs when she walks.  Review of Systems  Constitutional: Negative for fever.  HENT: Negative for ear pain.   Eyes: Negative for discharge.  Respiratory: Negative for cough.   Cardiovascular: Negative for chest pain.  Gastrointestinal: Negative for abdominal distention.  Endocrine: Negative for polyuria.  Genitourinary: Negative for difficulty urinating.  Musculoskeletal: Negative for gait problem and neck pain.  Skin: Negative for color change and rash.  Neurological: Negative for speech difficulty and headaches.  Psychiatric/Behavioral: Negative for agitation.       Objective:    BP 134/70 mmHg  Pulse 76  Temp(Src) 97.5 F (36.4 C) (Oral)  Ht 5\' 3"  (1.6 m)  Wt 147 lb 9.6 oz (66.951 kg)  BMI 26.15 kg/m2 Physical Exam  Constitutional: She is oriented to person, place, and time. She appears well-developed and well-nourished.  HENT:  Head: Normocephalic and atraumatic.  Mouth/Throat: Oropharynx is clear and moist.  Eyes: Pupils are equal, round, and reactive to light.  Neck: Normal range of motion. Neck supple.  Cardiovascular: Normal rate and regular rhythm.   No murmur heard. Pulmonary/Chest: Effort normal and breath sounds normal.  Abdominal: Soft. Bowel sounds are normal. There is no tenderness.  Neurological: She is alert and oriented to person, place, and time.  Skin: Skin is warm and dry.  Psychiatric: She has a normal mood and affect.    Results for orders placed or performed in visit on 08/01/14  POCT urinalysis dipstick  Result Value Ref Range   Color, UA GOLD    Clarity, UA CLEAR    Glucose, UA NEG    Bilirubin, UA NEG    Ketones, UA NEG    Spec Grav, UA 1.025    Blood, UA MOD    pH, UA 5.0    Protein, UA 1+    Urobilinogen, UA negative    Nitrite, UA NEG    Leukocytes, UA large (3+)   POCT UA - Microscopic Only  Result Value Ref Range   WBC, Ur, HPF, POC 40-80    RBC, urine, microscopic 5-10    Bacteria, U Microscopic moderate    Mucus, UA negative    Epithelial cells, urine per micros few    Crystals, Ur, HPF, POC negative    Casts, Ur, LPF, POC negative    Yeast, UA negative         Assessment & Plan:     ICD-9-CM ICD-10-CM   1. Weakness generalized 780.79 R53.1 POCT urinalysis dipstick     POCT UA - Microscopic Only     amoxicillin (AMOXIL) 875 MG tablet  2. Urinary tract infection without hematuria, site unspecified 599.0 N39.0 amoxicillin (AMOXIL) 875 MG tablet   She has been able to tolerate amoxicillin in the past and will try this first.  No Follow-up on file.  Deatra CanterWilliam J Oxford FNP

## 2014-08-01 NOTE — Addendum Note (Signed)
Addended by: Tommas OlpHANDY, [name] N on: 08/01/2014 06:09 PM   Modules accepted: Orders

## 2014-08-04 LAB — URINE CULTURE

## 2014-08-21 ENCOUNTER — Encounter: Payer: Self-pay | Admitting: Cardiology

## 2014-08-30 ENCOUNTER — Telehealth: Payer: Self-pay | Admitting: *Deleted

## 2014-08-30 NOTE — Telephone Encounter (Signed)
Son wants Ander SladeBill Oxford or his nurse to call him. He is upset! I do not know why, this came from the phone room.

## 2014-08-30 NOTE — Telephone Encounter (Signed)
Spoke with son - he is not aware of any problems - mom is in [facility] - broken pelvic bone - from fall  He will talk to other brother and if there is a problem - they will call us back

## 2014-09-25 ENCOUNTER — Ambulatory Visit: Payer: Medicare HMO | Admitting: Cardiology

## 2014-09-29 ENCOUNTER — Ambulatory Visit (INDEPENDENT_AMBULATORY_CARE_PROVIDER_SITE_OTHER): Payer: Medicare HMO | Admitting: Urology

## 2014-09-29 DIAGNOSIS — N390 Urinary tract infection, site not specified: Secondary | ICD-10-CM

## 2014-09-29 DIAGNOSIS — N3946 Mixed incontinence: Secondary | ICD-10-CM

## 2014-10-17 ENCOUNTER — Ambulatory Visit: Payer: Medicaid Other | Admitting: Family

## 2014-10-27 ENCOUNTER — Ambulatory Visit (INDEPENDENT_AMBULATORY_CARE_PROVIDER_SITE_OTHER): Payer: Medicare HMO | Admitting: Urology

## 2014-10-27 DIAGNOSIS — N3642 Intrinsic sphincter deficiency (ISD): Secondary | ICD-10-CM

## 2014-10-27 DIAGNOSIS — N3281 Overactive bladder: Secondary | ICD-10-CM | POA: Diagnosis not present

## 2014-10-27 DIAGNOSIS — N3946 Mixed incontinence: Secondary | ICD-10-CM | POA: Diagnosis not present

## 2014-11-06 ENCOUNTER — Other Ambulatory Visit: Payer: Self-pay | Admitting: Family Medicine

## 2014-11-09 ENCOUNTER — Ambulatory Visit (INDEPENDENT_AMBULATORY_CARE_PROVIDER_SITE_OTHER): Payer: Commercial Managed Care - HMO | Admitting: Family

## 2014-11-09 ENCOUNTER — Encounter: Payer: Self-pay | Admitting: Family

## 2014-11-09 VITALS — BP 134/70 | HR 66 | Wt 144.8 lb

## 2014-11-09 DIAGNOSIS — K219 Gastro-esophageal reflux disease without esophagitis: Secondary | ICD-10-CM | POA: Diagnosis not present

## 2014-11-09 DIAGNOSIS — F419 Anxiety disorder, unspecified: Secondary | ICD-10-CM | POA: Diagnosis not present

## 2014-11-09 DIAGNOSIS — J449 Chronic obstructive pulmonary disease, unspecified: Secondary | ICD-10-CM | POA: Diagnosis not present

## 2014-11-09 DIAGNOSIS — I10 Essential (primary) hypertension: Secondary | ICD-10-CM | POA: Diagnosis not present

## 2014-11-09 DIAGNOSIS — R110 Nausea: Secondary | ICD-10-CM | POA: Diagnosis not present

## 2014-11-09 DIAGNOSIS — Z23 Encounter for immunization: Secondary | ICD-10-CM | POA: Diagnosis not present

## 2014-11-09 DIAGNOSIS — I2510 Atherosclerotic heart disease of native coronary artery without angina pectoris: Secondary | ICD-10-CM

## 2014-11-09 DIAGNOSIS — E559 Vitamin D deficiency, unspecified: Secondary | ICD-10-CM

## 2014-11-09 DIAGNOSIS — N3946 Mixed incontinence: Secondary | ICD-10-CM

## 2014-11-09 DIAGNOSIS — E785 Hyperlipidemia, unspecified: Secondary | ICD-10-CM | POA: Diagnosis not present

## 2014-11-09 MED ORDER — ONDANSETRON HCL 8 MG PO TABS: 8.0000 mg | ORAL_TABLET | ORAL | Status: DC | PRN

## 2014-11-09 NOTE — Patient Instructions (Addendum)
Health Maintenance Adopting a healthy lifestyle and getting preventive care can go a long way to promote health and wellness. Talk with your health care provider about what schedule of regular examinations is right for you. This is a good chance for you to check in with your provider about disease prevention and staying healthy. In between checkups, there are plenty of things you can do on your own. Experts have done a lot of research about which lifestyle changes and preventive measures are most likely to keep you healthy. Ask your health care provider for more information. WEIGHT AND DIET  Eat a healthy diet  Be sure to include plenty of vegetables, fruits, low-fat dairy products, and lean protein.  Do not eat a lot of foods high in solid fats, added sugars, or salt.  Get regular exercise. This is one of the most important things you can do for your health.  Most adults should exercise for at least 150 minutes each week. The exercise should increase your heart rate and make you sweat (moderate-intensity exercise).  Most adults should also do strengthening exercises at least twice a week. This is in addition to the moderate-intensity exercise.  Maintain a healthy weight  Body mass index (BMI) is a measurement that can be used to identify possible weight problems. It estimates body fat based on height and weight. Your health care provider can help determine your BMI and help you achieve or maintain a healthy weight.  For females 25 years of age and older:   A BMI below 18.5 is considered underweight.  A BMI of 18.5 to 24.9 is normal.  A BMI of 25 to 29.9 is considered overweight.  A BMI of 30 and above is considered obese.  Watch levels of cholesterol and blood lipids  You should start having your blood tested for lipids and cholesterol at 79 years of age, then have this test every 5 years.  You may need to have your cholesterol levels checked more often if:  Your lipid or  cholesterol levels are high.  You are older than 79 years of age.  You are at high risk for heart disease.  CANCER SCREENING   Lung Cancer  Lung cancer screening is recommended for adults 97-92 years old who are at high risk for lung cancer because of a history of smoking.  A yearly low-dose CT scan of the lungs is recommended for people who:  Currently smoke.  Have quit within the past 15 years.  Have at least a 30-pack-year history of smoking. A pack year is smoking an average of one pack of cigarettes a day for 1 year.  Yearly screening should continue until it has been 15 years since you quit.  Yearly screening should stop if you develop a health problem that would prevent you from having lung cancer treatment.  Breast Cancer  Practice breast self-awareness. This means understanding how your breasts normally appear and feel.  It also means doing regular breast self-exams. Let your health care provider know about any changes, no matter how small.  If you are in your 20s or 30s, you should have a clinical breast exam (CBE) by a health care provider every 1-3 years as part of a regular health exam.  If you are 76 or older, have a CBE every year. Also consider having a breast X-ray (mammogram) every year.  If you have a family history of breast cancer, talk to your health care provider about genetic screening.  If you are  at high risk for breast cancer, talk to your health care provider about having an MRI and a mammogram every year.  Breast cancer gene (BRCA) assessment is recommended for women who have family members with BRCA-related cancers. BRCA-related cancers include:  Breast.  Ovarian.  Tubal.  Peritoneal cancers.  Results of the assessment will determine the need for genetic counseling and BRCA1 and BRCA2 testing. Cervical Cancer Routine pelvic examinations to screen for cervical cancer are no longer recommended for nonpregnant women who are considered low  risk for cancer of the pelvic organs (ovaries, uterus, and vagina) and who do not have symptoms. A pelvic examination may be necessary if you have symptoms including those associated with pelvic infections. Ask your health care provider if a screening pelvic exam is right for you.   The Pap test is the screening test for cervical cancer for women who are considered at risk.  If you had a hysterectomy for a problem that was not cancer or a condition that could lead to cancer, then you no longer need Pap tests.  If you are older than 65 years, and you have had normal Pap tests for the past 10 years, you no longer need to have Pap tests.  If you have had past treatment for cervical cancer or a condition that could lead to cancer, you need Pap tests and screening for cancer for at least 20 years after your treatment.  If you no longer get a Pap test, assess your risk factors if they change (such as having a new sexual partner). This can affect whether you should start being screened again.  Some women have medical problems that increase their chance of getting cervical cancer. If this is the case for you, your health care provider may recommend more frequent screening and Pap tests.  The human papillomavirus (HPV) test is another test that may be used for cervical cancer screening. The HPV test looks for the virus that can cause cell changes in the cervix. The cells collected during the Pap test can be tested for HPV.  The HPV test can be used to screen women 30 years of age and older. Getting tested for HPV can extend the interval between normal Pap tests from three to five years.  An HPV test also should be used to screen women of any age who have unclear Pap test results.  After 79 years of age, women should have HPV testing as often as Pap tests.  Colorectal Cancer  This type of cancer can be detected and often prevented.  Routine colorectal cancer screening usually begins at 79 years of  age and continues through 79 years of age.  Your health care provider may recommend screening at an earlier age if you have risk factors for colon cancer.  Your health care provider may also recommend using home test kits to check for hidden blood in the stool.  A small camera at the end of a tube can be used to examine your colon directly (sigmoidoscopy or colonoscopy). This is done to check for the earliest forms of colorectal cancer.  Routine screening usually begins at age 50.  Direct examination of the colon should be repeated every 5-10 years through 79 years of age. However, you may need to be screened more often if early forms of precancerous polyps or small growths are found. Skin Cancer  Check your skin from head to toe regularly.  Tell your health care provider about any new moles or changes in   moles, especially if there is a change in a mole's shape or color.  Also tell your health care provider if you have a mole that is larger than the size of a pencil eraser.  Always use sunscreen. Apply sunscreen liberally and repeatedly throughout the day.  Protect yourself by wearing long sleeves, pants, a wide-brimmed hat, and sunglasses whenever you are outside. HEART DISEASE, DIABETES, AND HIGH BLOOD PRESSURE   Have your blood pressure checked at least every 1-2 years. High blood pressure causes heart disease and increases the risk of stroke.  If you are between 75 years and 42 years old, ask your health care provider if you should take aspirin to prevent strokes.  Have regular diabetes screenings. This involves taking a blood sample to check your fasting blood sugar level.  If you are at a normal weight and have a low risk for diabetes, have this test once every three years after 79 years of age.  If you are overweight and have a high risk for diabetes, consider being tested at a younger age or more often. PREVENTING INFECTION  Hepatitis B  If you have a higher risk for  hepatitis B, you should be screened for this virus. You are considered at high risk for hepatitis B if:  You were born in a country where hepatitis B is common. Ask your health care provider which countries are considered high risk.  Your parents were born in a high-risk country, and you have not been immunized against hepatitis B (hepatitis B vaccine).  You have HIV or AIDS.  You use needles to inject street drugs.  You live with someone who has hepatitis B.  You have had sex with someone who has hepatitis B.  You get hemodialysis treatment.  You take certain medicines for conditions, including cancer, organ transplantation, and autoimmune conditions. Hepatitis C  Blood testing is recommended for:  Everyone born from [birth year] through 1963.  Anyone with known risk factors for hepatitis C. Sexually transmitted infections (STIs)  You should be screened for sexually transmitted infections (STIs) including gonorrhea and chlamydia if:  You are sexually active and are younger than 79 years of age.  You are older than 79 years of age and your health care provider tells you that you are at risk for this type of infection.  Your sexual activity has changed since you were last screened and you are at an increased risk for chlamydia or gonorrhea. Ask your health care provider if you are at risk.  If you do not have HIV, but are at risk, it may be recommended that you take a prescription medicine daily to prevent HIV infection. This is called pre-exposure prophylaxis (PrEP). You are considered at risk if:  You are sexually active and do not regularly use condoms or know the HIV status of your partner(s).  You take drugs by injection.  You are sexually active with a partner who has HIV. Talk with your health care provider about whether you are at high risk of being infected with HIV. If you choose to begin PrEP, you should first be tested for HIV. You should then be tested every 3 months for  as long as you are taking PrEP.  PREGNANCY   If you are premenopausal and you may become pregnant, ask your health care provider about preconception counseling.  If you may become pregnant, take 400 to 800 micrograms (mcg) of folic acid every day.  If you want to prevent pregnancy, talk to your  health care provider about birth control (contraception). OSTEOPOROSIS AND MENOPAUSE   Osteoporosis is a disease in which the bones lose minerals and strength with aging. This can result in serious bone fractures. Your risk for osteoporosis can be identified using a bone density scan.  If you are 8 years of age or older, or if you are at risk for osteoporosis and fractures, ask your health care provider if you should be screened.  Ask your health care provider whether you should take a calcium or vitamin D supplement to lower your risk for osteoporosis.  Menopause may have certain physical symptoms and risks.  Hormone replacement therapy may reduce some of these symptoms and risks. Talk to your health care provider about whether hormone replacement therapy is right for you.  HOME CARE INSTRUCTIONS   Schedule regular health, dental, and eye exams.  Stay current with your immunizations.   Do not use any tobacco products including cigarettes, chewing tobacco, or electronic cigarettes.  If you are pregnant, do not drink alcohol.  If you are breastfeeding, limit how much and how often you drink alcohol.  Limit alcohol intake to no more than 1 drink per day for nonpregnant women. One drink equals 12 ounces of beer, 5 ounces of wine, or 1 ounces of hard liquor.  Do not use street drugs.  Do not share needles.  Ask your health care provider for help if you need support or information about quitting drugs.  Tell your health care provider if you often feel depressed.  Tell your health care provider if you have ever been abused or do not feel safe at home. Document Released: 03/03/2011  Document Revised: 01/02/2014 Document Reviewed: 07/20/2013 [facility] Patient Information 2015 [place], [place]. This information is not intended to replace advice given to you by your health care provider. Make sure you discuss any questions you have with your health care provider. Fat and Cholesterol Control Diet Fat and cholesterol levels in your blood and organs are influenced by your diet. High levels of fat and cholesterol may lead to diseases of the heart, small and large blood vessels, gallbladder, liver, and pancreas. CONTROLLING FAT AND CHOLESTEROL WITH DIET Although exercise and lifestyle factors are important, your diet is key. That is because certain foods are known to raise cholesterol and others to lower it. The goal is to balance foods for their effect on cholesterol and more importantly, to replace saturated and trans fat with other types of fat, such as monounsaturated fat, polyunsaturated fat, and omega-3 fatty acids. On average, a person should consume no more than 15 to 17 g of saturated fat daily. Saturated and trans fats are considered "bad" fats, and they will raise LDL cholesterol. Saturated fats are primarily found in animal products such as meats, butter, and cream. However, that does not mean you need to give up all your favorite foods. Today, there are good tasting, low-fat, low-cholesterol substitutes for most of the things you like to eat. Choose low-fat or nonfat alternatives. Choose round or loin cuts of red meat. These types of cuts are lowest in fat and cholesterol. Chicken (without the skin), fish, veal, and ground [place] breast are great choices. Eliminate fatty meats, such as hot dogs and salami. Even shellfish have little or no saturated fat. Have a 3 oz (85 g) portion when you eat lean meat, poultry, or fish. Trans fats are also called "partially hydrogenated oils." They are oils that have been scientifically manipulated so that they are solid at room  temperature resulting  in a longer shelf life and improved taste and texture of foods in which they are added. Trans fats are found in stick margarine, some tub margarines, cookies, crackers, and baked goods.  When baking and cooking, oils are a great substitute for butter. The monounsaturated oils are especially beneficial since it is believed they lower LDL and raise HDL. The oils you should avoid entirely are saturated tropical oils, such as coconut and palm.  Remember to eat a lot from food groups that are naturally free of saturated and trans fat, including fish, fruit, vegetables, beans, grains (barley, rice, couscous, bulgur wheat), and pasta (without cream sauces).  IDENTIFYING FOODS THAT LOWER FAT AND CHOLESTEROL  Soluble fiber may lower your cholesterol. This type of fiber is found in fruits such as apples, vegetables such as broccoli, potatoes, and carrots, legumes such as beans, peas, and lentils, and grains such as barley. Foods fortified with plant sterols (phytosterol) may also lower cholesterol. You should eat at least 2 g per day of these foods for a cholesterol lowering effect.  Read package labels to identify low-saturated fats, trans fat free, and low-fat foods at the supermarket. Select cheeses that have only 2 to 3 g saturated fat per ounce. Use a heart-healthy tub margarine that is free of trans fats or partially hydrogenated oil. When buying baked goods (cookies, crackers), avoid partially hydrogenated oils. Breads and muffins should be made from whole grains (whole-wheat or whole oat flour, instead of "flour" or "enriched flour"). Buy non-creamy canned soups with reduced salt and no added fats.  FOOD PREPARATION TECHNIQUES  Never deep-fry. If you must fry, either stir-fry, which uses very little fat, or use non-stick cooking sprays. When possible, broil, bake, or roast meats, and steam vegetables. Instead of putting butter or margarine on vegetables, use lemon and herbs, applesauce, and cinnamon (for squash  and sweet potatoes). Use nonfat yogurt, salsa, and low-fat dressings for salads.  LOW-SATURATED FAT / LOW-FAT FOOD SUBSTITUTES Meats / Saturated Fat (g)  Avoid: Steak, marbled (3 oz/85 g) / 11 g  Choose: Steak, lean (3 oz/85 g) / 4 g  Avoid: Hamburger (3 oz/85 g) / 7 g  Choose: Hamburger, lean (3 oz/85 g) / 5 g  Avoid: Ham (3 oz/85 g) / 6 g  Choose: Ham, lean cut (3 oz/85 g) / 2.4 g  Avoid: Chicken, with skin, dark meat (3 oz/85 g) / 4 g  Choose: Chicken, skin removed, dark meat (3 oz/85 g) / 2 g  Avoid: Chicken, with skin, light meat (3 oz/85 g) / 2.5 g  Choose: Chicken, skin removed, light meat (3 oz/85 g) / 1 g Dairy / Saturated Fat (g)  Avoid: Whole milk (1 cup) / 5 g  Choose: Low-fat milk, 2% (1 cup) / 3 g  Choose: Low-fat milk, 1% (1 cup) / 1.5 g  Choose: Skim milk (1 cup) / 0.3 g  Avoid: Hard cheese (1 oz/28 g) / 6 g  Choose: Skim milk cheese (1 oz/28 g) / 2 to 3 g  Avoid: Cottage cheese, 4% fat (1 cup) / 6.5 g  Choose: Low-fat cottage cheese, 1% fat (1 cup) / 1.5 g  Avoid: Ice cream (1 cup) / 9 g  Choose: Sherbet (1 cup) / 2.5 g  Choose: Nonfat frozen yogurt (1 cup) / 0.3 g  Choose: Frozen fruit bar / trace  Avoid: Whipped cream (1 tbs) / 3.5 g  Choose: Nondairy whipped topping (1 tbs) / 1 g Condiments /  Saturated Fat (g)  Avoid: Mayonnaise (1 tbs) / 2 g  Choose: Low-fat mayonnaise (1 tbs) / 1 g  Avoid: Butter (1 tbs) / 7 g  Choose: Extra light margarine (1 tbs) / 1 g  Avoid: Coconut oil (1 tbs) / 11.8 g  Choose: Olive oil (1 tbs) / 1.8 g  Choose: Corn oil (1 tbs) / 1.7 g  Choose: Safflower oil (1 tbs) / 1.2 g  Choose: Sunflower oil (1 tbs) / 1.4 g  Choose: Soybean oil (1 tbs) / 2.4 g  Choose: Canola oil (1 tbs) / 1 g Document Released: 08/18/2005 Document Revised: 12/13/2012 Document Reviewed: 11/16/2013 [employer] Patient Information 2015 [place], [place]. This information is not intended to replace advice given to you by your health  care provider. Make sure you discuss any questions you have with your health care provider.

## 2014-11-09 NOTE — Progress Notes (Signed)
Subjective:    Patient ID: [name], female    DOB: [birth year], 81 y.o.   MRN: [id number]  Hypertension This is a chronic problem. The current episode started more than 1 year ago. The problem is controlled. Associated symptoms include anxiety and palpitations. Pertinent negatives include no chest pain, headaches, peripheral edema or shortness of breath. Risk factors for coronary artery disease include dyslipidemia, post-menopausal state and sedentary lifestyle. Past treatments include ACE inhibitors. The current treatment provides moderate improvement. Hypertensive end-organ damage includes CAD/MI. There is no history of kidney disease, CVA, heart failure or a thyroid problem. There is no history of sleep apnea.  Hyperlipidemia This is a chronic problem. The current episode started more than 1 year ago. The problem is controlled. Recent lipid tests were reviewed and are normal. She has no history of diabetes. Pertinent negatives include no chest pain, leg pain, myalgias or shortness of breath. Current antihyperlipidemic treatment includes statins. The current treatment provides significant improvement of lipids. Risk factors for coronary artery disease include dyslipidemia, hypertension, post-menopausal and a sedentary lifestyle.  Gastrophageal Reflux She complains of heartburn. She reports no belching, no chest pain, no choking or no sore throat. This is a chronic problem. The current episode started more than 1 year ago. The problem occurs occasionally. The problem has been waxing and waning. The symptoms are aggravated by certain foods and lying down. She has tried a PPI for the symptoms. The treatment provided significant relief.  Anxiety Presents for follow-up visit. Symptoms include excessive worry, nervous/anxious behavior and palpitations. Patient reports no chest pain, decreased concentration, malaise or shortness of breath. Symptoms occur rarely. The severity of symptoms is mild. The  quality of sleep is good.   Her past medical history is significant for anxiety/panic attacks. There is no history of depression. Past treatments include nothing. Compliance with prior treatments has been good.  COPD Pt has an albuterol inhaler, but does not use it. Pt states her breathing is good. OAB  Pt currently taking myrbetriq daily. Pt see's a urologists who is manages that.     Review of Systems  Constitutional: Negative.   HENT: Negative.  Negative for sore throat.   Eyes: Negative.   Respiratory: Negative.  Negative for choking and shortness of breath.   Cardiovascular: Positive for palpitations. Negative for chest pain.  Gastrointestinal: Positive for heartburn.  Endocrine: Negative.   Genitourinary: Negative.   Musculoskeletal: Negative.  Negative for myalgias.  Neurological: Negative.  Negative for headaches.  Hematological: Negative.   Psychiatric/Behavioral: Negative for decreased concentration. The patient is nervous/anxious.   All other systems reviewed and are negative.      Objective:   Physical Exam  Constitutional: She is oriented to person, place, and time. She appears well-developed and well-nourished. No distress.  HENT:  Head: Normocephalic and atraumatic.  Right Ear: External ear normal.  Left Ear: External ear normal.  Nose: Nose normal.  Mouth/Throat: Oropharynx is clear and moist.  Eyes: Pupils are equal, round, and reactive to light.  Neck: Normal range of motion. Neck supple. No thyromegaly present.  Cardiovascular: Normal rate, regular rhythm, normal heart sounds and intact distal pulses.   No murmur heard. Pulmonary/Chest: Effort normal and breath sounds normal. No respiratory distress. She has no wheezes.  Abdominal: Soft. Bowel sounds are normal. She exhibits no distension. There is no tenderness.  Musculoskeletal: Normal range of motion. She exhibits no edema or tenderness.  Neurological: She is alert and oriented to person, place, and  time.  She has normal reflexes. No cranial nerve deficit.  Skin: Skin is warm and dry.  Psychiatric: She has a normal mood and affect. Her behavior is normal. Judgment and thought content normal.  Vitals reviewed.    BP 134/70 mmHg  Pulse 66  Wt 144 lb 12.8 oz (65.681 kg)      Assessment & Plan:  1. Essential hypertension, benign - CMP14+EGFR  2. Atherosclerosis of native coronary artery of native heart without angina pector - CMP14+EGFR  3. Chronic obstructive pulmonary disease, unspecified COPD, unspecified chronic bronchitis type - CMP14+EGFR  4. Gastroesophageal reflux disease, esophagitis presence not specified - CMP14+EGFR  5. Mixed incontinence  - CMP14+EGFR  6. Hyperlipidemia - CMP14+EGFR - Lipid panel  7. Anxiety  - CMP14+EGFR  8. Vitamin D deficienc - CMP14+EGFR - Vit D  25 hydroxy (rtn osteoporosis monitoring)  9. Nausea without vomiting - ondansetron (ZOFRAN) 8 MG tablet; Take 1 tablet (8 mg total) by mouth every 4 (four) hours as needed for nausea or vomiting.  Dispense: 10 tablet; Refill: 0   Continue all meds Labs pending Health Maintenance reviewed- Prevnar given today Diet and exercise encouraged RTO 6 months   Evelina Dun, FNP

## 2014-11-10 ENCOUNTER — Encounter: Payer: Self-pay | Admitting: Family

## 2014-11-10 LAB — LIPID PANEL
CHOL/HDL RATIO: 2 ratio (ref 0.0–4.4)
Cholesterol, Total: 146 mg/dL (ref 100–199)
HDL: 72 mg/dL (ref >39)
LDL Calculated: 56 mg/dL (ref 0–99)
TRIGLYCERIDES: 88 mg/dL (ref 0–149)
VLDL CHOLESTEROL CAL: 18 mg/dL (ref 5–40)

## 2014-11-10 LAB — CMP14+EGFR
ALT: 7 IU/L (ref 0–32)
AST: 16 IU/L (ref 0–40)
Albumin/Globulin Ratio: 1.7 (ref 1.1–2.5)
Albumin: 4.1 g/dL (ref 3.5–4.7)
Alkaline Phosphatase: 73 IU/L (ref 39–117)
BUN/Creatinine Ratio: 17 (ref 11–26)
BUN: 16 mg/dL (ref 8–27)
Bilirubin Total: 0.8 mg/dL (ref 0.0–1.2)
CHLORIDE: 102 mmol/L (ref 97–108)
CO2: 24 mmol/L (ref 18–29)
Calcium: 9.5 mg/dL (ref 8.7–10.3)
Creatinine, Ser: 0.92 mg/dL (ref 0.57–1.00)
GFR calc Af Amer: 67 mL/min/{1.73_m2} (ref >59)
GFR calc non Af Amer: 58 mL/min/{1.73_m2} — ABNORMAL LOW (ref >59)
GLUCOSE: 101 mg/dL — AB (ref 65–99)
Globulin, Total: 2.4 g/dL (ref 1.5–4.5)
Potassium: 4.5 mmol/L (ref 3.5–5.2)
Sodium: 142 mmol/L (ref 134–144)
Total Protein: 6.5 g/dL (ref 6.0–8.5)

## 2014-11-10 LAB — VITAMIN D 25 HYDROXY (VIT D DEFICIENCY, FRACTURES): VIT D 25 HYDROXY: 39.7 ng/mL (ref 30.0–100.0)

## 2014-11-16 ENCOUNTER — Ambulatory Visit (INDEPENDENT_AMBULATORY_CARE_PROVIDER_SITE_OTHER): Payer: Commercial Managed Care - HMO | Admitting: Cardiology

## 2014-11-16 ENCOUNTER — Encounter: Payer: Self-pay | Admitting: Cardiology

## 2014-11-16 VITALS — BP 135/75 | HR 67 | Ht 64.0 in | Wt 144.4 lb

## 2014-11-16 DIAGNOSIS — I10 Essential (primary) hypertension: Secondary | ICD-10-CM

## 2014-11-16 DIAGNOSIS — I2510 Atherosclerotic heart disease of native coronary artery without angina pectoris: Secondary | ICD-10-CM

## 2014-11-16 DIAGNOSIS — E782 Mixed hyperlipidemia: Secondary | ICD-10-CM

## 2014-11-16 NOTE — Progress Notes (Signed)
Cardiology Office Note  Date: 11/16/2014   ID: [name], DOB November 02, 1930, MRN [id number]  PCP: Junie SpencerHawks, [name] A, FNP  Primary Cardiologist: Nona DellSamuel McDowell, MD   Chief Complaint  Patient presents with  . Coronary Artery Disease  . Hypertension    History of Present Illness: [name] is an 81 y.o. female last seen in June 2015. She presents for a routine visit today. She tells me that in the interim she fractured her left hip and [facility], subsequently underwent rehabilitation. She is back at home now. Fortunately, she has been stable from a cardiac perspective without progressive angina symptoms.  She continues to follow with Ms. Hawks NP for primary care. I reviewed recent lab work outlined below. Patient reports compliance with her medications.  Cardiac catheterization in October 2012 showed patent stents within the mid to distal LAD, moderate disease within the diagonal that has been managed medically.  Past Medical History  Diagnosis Date  . Coronary atherosclerosis of native coronary artery     DES LAD and BMS LAD 8/08  . Mixed hyperlipidemia   . GERD (gastroesophageal reflux disease)   . Essential hypertension, benign   . Anxiety   . Resting tremor   . COPD (chronic obstructive pulmonary disease)   . Rickets   . MI (myocardial infarction) 2008    Past Surgical History  Procedure Laterality Date  . Appendectomy    . Cholecystectomy    . Bilateral cataract surgery    . Vein ligation and stripping    . Cardiac catheterization    . Coronary angioplasty with stent placement    . Left trochanteric bursa injection  11/27/11    DR. O'TOOLE  . Left sacroiliac joint injection  11/27/11    DR. O'TOOLE  . Yag laser application  09/06/2012    Procedure: YAG LASER APPLICATION;  Surgeon: Susa Simmondsarroll F Haines, MD;  Location: [facility];  Service: Ophthalmology;  Laterality: Left;    Current Outpatient Prescriptions  Medication Sig Dispense Refill  .  acetaminophen (TYLENOL) 500 MG tablet Take 500 mg by mouth every 6 (six) hours as needed. pain    . albuterol (PROVENTIL HFA;VENTOLIN HFA) 108 (90 BASE) MCG/ACT inhaler Inhale 2 puffs into the lungs every 6 (six) hours as needed for wheezing or shortness of breath. 1 Inhaler 0  . albuterol (PROVENTIL) (2.5 MG/3ML) 0.083% nebulizer solution Take 3 mLs (2.5 mg total) by nebulization every 6 (six) hours as needed for wheezing or shortness of breath. 150 mL 1  . atorvastatin (LIPITOR) 20 MG tablet TAKE 1 TABLET BY MOUTH DAILY AT 6 PM. 30 tablet 0  . calcium citrate-vitamin D (CITRACAL+D) 315-200 MG-UNIT per tablet Take 1 tablet by mouth daily.    . cholecalciferol (VITAMIN D) 1000 UNITS tablet Take 4,000 Units by mouth daily.    . clopidogrel (PLAVIX) 75 MG tablet TAKE 1 TABLET BY MOUTH DAILY 30 tablet 6  . isosorbide mononitrate (IMDUR) 30 MG 24 hr tablet TAKE 1 TABLET BY MOUTH TWICE DAILY 60 tablet 6  . lisinopril (PRINIVIL,ZESTRIL) 10 MG tablet TAKE 1 TABLET BY MOUTH EVERY DAY 30 tablet 6  . LORazepam (ATIVAN) 0.5 MG tablet TAKE 1 TABLET BY MOUTH TWICE DAILY 60 tablet 3  . mirabegron ER (MYRBETRIQ) 50 MG TB24 tablet Take 50 mg by mouth daily.    . nitroGLYCERIN (NITROSTAT) 0.4 MG SL tablet Place 1 tablet (0.4 mg total) under the tongue every 5 (five) minutes as needed. 25 tablet 3  .  ondansetron (ZOFRAN) 8 MG tablet Take 1 tablet (8 mg total) by mouth every 4 (four) hours as needed for nausea or vomiting. 30 tablet 3  . pantoprazole (PROTONIX) 40 MG tablet TAKE 1 TABLET BY MOUTH EVERY DAY AT NOON 30 tablet 5   Current Facility-Administered Medications  Medication Dose Route Frequency Provider Last Rate Last Dose  . levalbuterol (XOPENEX) nebulizer solution 1.25 mg  1.25 mg Nebulization Once Deatra Canter, FNP        Allergies:  Azithromycin; Cephalexin; Ciprofloxacin; Codeine; Contrast media; Latex; Nitrofuran derivatives; Nsaids; Nutritional supplements; Penicillins; Phenazopyridine;  Quinolones; Sulfa antibiotics; Toviaz; and Trimethoprim   Social History: The patient  reports that she has never smoked. She has never used smokeless tobacco. She reports that she does not drink alcohol or use illicit drugs.   ROS:  Please see the history of present illness. Otherwise, complete review of systems is positive for anxiety.  All other systems are reviewed and negative.    Physical Exam: VS:  BP 135/75 mmHg  Pulse 67  Ht  (1.626 m)  Wt 144 lb 6.4 oz (65.499 kg)  BMI 24.77 kg/m2  SpO2 98%, BMI Body mass index is 24.77 kg/(m^2).  Wt Readings from Last 3 Encounters:  11/16/14 144 lb 6.4 oz (65.499 kg)  11/09/14 144 lb 12.8 oz (65.681 kg)  08/01/14 147 lb 9.6 oz (66.951 kg)     Comfortable at rest.  HEENT: Conjunctiva and lids normal, oropharynx clear.  Neck: Supple, no elevated jugular venous pressure, no bruits.  Lungs: Clear to auscultation, nonlabored.  Cardiac: Regular rate and rhythm, no S3 gallop.  Abdomen: Soft, nontender, no bruits.  Skin: Warm and dry.  Extremities: No significant peripheral edema  Musculoskeletal: Mild kyphosis.  Neuropsychiatric: Alert and oriented x3, resting tremor. Somewhat anxious.   ECG: Follow-up tracing from 08/21/2014 showed sinus rhythm with short PR interval, no acute ST segment changes.   Recent Labwork: 05/12/2014: TSH 2.270 06/12/2014: Hemoglobin 12.5; Platelets 144* 11/09/2014: ALT 7; AST 16; BUN 16; Creatinine 0.92; Potassium 4.5; Sodium 142     Component Value Date/Time   CHOL 146 11/09/2014 1523   CHOL 135 06/07/2012 0528   TRIG 88 11/09/2014 1523   HDL 72 11/09/2014 1523   HDL 63 06/07/2012 0528   CHOLHDL 2.0 11/09/2014 1523   CHOLHDL 2.1 06/07/2012 0528   VLDL 17 06/07/2012 0528   LDLCALC 56 11/09/2014 1523   LDLCALC 55 06/07/2012 0528    Assessment and Plan:  1. Symptomatically stable CAD status post prior DES and BMS to the LAD. Plan is to continue medical therapy and observation.  2.  Hyperlipidemia, recent LDL 56 on Lipitor.  3. Essential hypertension, continue Prinivil.  Current medicines are reviewed at length with the patient today.    Disposition: FU with me in 6 months.   Signed, Jonelle Sidle, MD, [facility] 11/16/2014 3:38 PM    [facility] [address] [place], [place], [place] [address] Phone: [phone]; Fax: [phone]

## 2014-11-16 NOTE — Patient Instructions (Signed)

## 2014-12-01 ENCOUNTER — Ambulatory Visit (INDEPENDENT_AMBULATORY_CARE_PROVIDER_SITE_OTHER): Payer: Commercial Managed Care - HMO | Admitting: Family

## 2014-12-01 ENCOUNTER — Encounter: Payer: Self-pay | Admitting: Family

## 2014-12-01 VITALS — BP 135/60 | HR 68 | Temp 97.3°F | Ht 64.0 in | Wt 141.6 lb

## 2014-12-01 DIAGNOSIS — N390 Urinary tract infection, site not specified: Secondary | ICD-10-CM | POA: Diagnosis not present

## 2014-12-01 DIAGNOSIS — R300 Dysuria: Secondary | ICD-10-CM

## 2014-12-01 DIAGNOSIS — R319 Hematuria, unspecified: Secondary | ICD-10-CM | POA: Diagnosis not present

## 2014-12-01 LAB — POCT URINALYSIS DIPSTICK
Bilirubin, UA: NEGATIVE
Glucose, UA: NEGATIVE
KETONES UA: NEGATIVE
NITRITE UA: NEGATIVE
PH UA: 5
Protein, UA: NEGATIVE
Spec Grav, UA: 1.01
Urobilinogen, UA: NEGATIVE

## 2014-12-01 LAB — POCT UA - MICROSCOPIC ONLY
Bacteria, U Microscopic: NEGATIVE
Casts, Ur, LPF, POC: NEGATIVE
Crystals, Ur, HPF, POC: NEGATIVE
MUCUS UA: NEGATIVE
Yeast, UA: NEGATIVE

## 2014-12-01 MED ORDER — DOXYCYCLINE HYCLATE 100 MG PO TABS: 100.0000 mg | ORAL_TABLET | Freq: Two times a day (BID) | ORAL | Status: DC

## 2014-12-01 NOTE — Progress Notes (Signed)
   Subjective:    Patient ID: Suzanne MillinKatherine [name], female    DOB: [birth year], 81 y.o.   MRN: [id number]  Dysuria  This is a new problem. The current episode started yesterday. The problem occurs every urination. The problem has been unchanged. The quality of the pain is described as burning. The pain is at a severity of 9/10. The pain is mild. Associated symptoms include chills, flank pain, frequency, hesitancy and urgency. Pertinent negatives include no discharge, hematuria, nausea or vomiting. She has tried increased fluids for the symptoms. The treatment provided moderate relief. There is no history of recurrent UTIs.      Review of Systems  Constitutional: Positive for chills.  HENT: Negative.   Eyes: Negative.   Respiratory: Negative.  Negative for shortness of breath.   Cardiovascular: Negative.  Negative for palpitations.  Gastrointestinal: Negative.  Negative for nausea and vomiting.  Endocrine: Negative.   Genitourinary: Positive for dysuria, hesitancy, urgency, frequency and flank pain. Negative for hematuria.  Musculoskeletal: Negative.   Neurological: Negative.  Negative for headaches.  Hematological: Negative.   Psychiatric/Behavioral: Negative.   All other systems reviewed and are negative.      Objective:   Physical Exam  Constitutional: She is oriented to person, place, and time. She appears well-developed and well-nourished. No distress.  Eyes: Pupils are equal, round, and reactive to light.  Neck: Normal range of motion. Neck supple. No thyromegaly present.  Cardiovascular: Normal rate, regular rhythm, normal heart sounds and intact distal pulses.   No murmur heard. Pulmonary/Chest: Effort normal and breath sounds normal. No respiratory distress. She has no wheezes.  Abdominal: Soft. Bowel sounds are normal. She exhibits no distension. There is no tenderness.  Musculoskeletal: Normal range of motion. She exhibits no edema or tenderness.  Neg for CVA tenderness     Neurological: She is alert and oriented to person, place, and time. She has normal reflexes. No cranial nerve deficit.  Skin: Skin is warm and dry.  Psychiatric: She has a normal mood and affect. Her behavior is normal. Judgment and thought content normal.  Vitals reviewed.     BP 135/60 mmHg  Pulse 68  Temp(Src) 97.3 F (36.3 C) (Oral)  Ht 5\' 4"  (1.626 m)  Wt 141 lb 9.6 oz (64.229 kg)  BMI 24.29 kg/m2     Assessment & Plan:  1. Dysuria - POCT UA - Microscopic Only - POCT urinalysis dipstick - Urine culture  2. Urinary tract infection with hematuria, site unspecified -Force fluids AZO over the counter X2 days RTO prn Culture pending - doxycycline (VIBRA-TABS) 100 MG tablet; Take 1 tablet (100 mg total) by mouth 2 (two) times daily.  Dispense: 20 tablet; Refill: 0  Jannifer Rodneyhristy Trinady Milewski, FNP

## 2014-12-01 NOTE — Patient Instructions (Signed)

## 2014-12-02 LAB — URINE CULTURE

## 2014-12-05 ENCOUNTER — Other Ambulatory Visit: Payer: Self-pay | Admitting: Family

## 2014-12-07 ENCOUNTER — Telehealth: Payer: Self-pay | Admitting: *Deleted

## 2014-12-07 ENCOUNTER — Other Ambulatory Visit: Payer: Self-pay | Admitting: Family

## 2014-12-07 MED ORDER — CLOPIDOGREL BISULFATE 75 MG PO TABS
75.0000 mg | ORAL_TABLET | Freq: Every day | ORAL | Status: DC
Start: 2014-12-07 — End: 2015-07-09

## 2014-12-07 MED ORDER — LISINOPRIL 10 MG PO TABS: 10.0000 mg | ORAL_TABLET | Freq: Every day | ORAL | Status: DC

## 2014-12-07 MED ORDER — ISOSORBIDE MONONITRATE ER 30 MG PO TB24: 30.0000 mg | ORAL_TABLET | Freq: Two times a day (BID) | ORAL | Status: DC

## 2014-12-07 NOTE — Telephone Encounter (Signed)
Gave verbal refills for lisinopril, Imdur, plavix to [facility] at [facility] per request since fax request were not being received.

## 2014-12-08 ENCOUNTER — Telehealth: Payer: Self-pay

## 2014-12-08 NOTE — Telephone Encounter (Signed)
LMOM for pt to return call to x-ray to schedule a DEXA appointment. Her insurance is requesting specifically because of her recent hip fx

## 2014-12-11 ENCOUNTER — Emergency Department (HOSPITAL_COMMUNITY)
Admission: EM | Admit: 2014-12-11 | Discharge: 2014-12-11 | Disposition: A | Discharge status: Home / Self Care | Payer: Commercial Managed Care - HMO | Attending: Emergency Medicine | Admitting: Emergency Medicine

## 2014-12-11 ENCOUNTER — Telehealth: Payer: Self-pay | Admitting: Family

## 2014-12-11 ENCOUNTER — Encounter (HOSPITAL_COMMUNITY): Payer: Self-pay | Admitting: Cardiology

## 2014-12-11 DIAGNOSIS — I252 Old myocardial infarction: Secondary | ICD-10-CM | POA: Diagnosis not present

## 2014-12-11 DIAGNOSIS — Z880 Allergy status to penicillin: Secondary | ICD-10-CM | POA: Diagnosis not present

## 2014-12-11 DIAGNOSIS — F419 Anxiety disorder, unspecified: Secondary | ICD-10-CM | POA: Diagnosis not present

## 2014-12-11 DIAGNOSIS — I2510 Atherosclerotic heart disease of native coronary artery without angina pectoris: Secondary | ICD-10-CM | POA: Insufficient documentation

## 2014-12-11 DIAGNOSIS — Z91040 Latex allergy status: Secondary | ICD-10-CM | POA: Insufficient documentation

## 2014-12-11 DIAGNOSIS — Z79899 Other long term (current) drug therapy: Secondary | ICD-10-CM | POA: Diagnosis not present

## 2014-12-11 DIAGNOSIS — E782 Mixed hyperlipidemia: Secondary | ICD-10-CM | POA: Insufficient documentation

## 2014-12-11 DIAGNOSIS — K219 Gastro-esophageal reflux disease without esophagitis: Secondary | ICD-10-CM | POA: Insufficient documentation

## 2014-12-11 DIAGNOSIS — M6281 Muscle weakness (generalized): Secondary | ICD-10-CM | POA: Insufficient documentation

## 2014-12-11 DIAGNOSIS — J449 Chronic obstructive pulmonary disease, unspecified: Secondary | ICD-10-CM | POA: Diagnosis not present

## 2014-12-11 DIAGNOSIS — R531 Weakness: Secondary | ICD-10-CM | POA: Diagnosis not present

## 2014-12-11 DIAGNOSIS — I10 Essential (primary) hypertension: Secondary | ICD-10-CM | POA: Insufficient documentation

## 2014-12-11 LAB — CBC WITH DIFFERENTIAL/PLATELET
Basophils Absolute: 0.1 10*3/uL (ref 0.0–0.1)
Basophils Relative: 2 % — ABNORMAL HIGH (ref 0–1)
EOS ABS: 0.1 10*3/uL (ref 0.0–0.7)
Eosinophils Relative: 2 % (ref 0–5)
HCT: 37.6 % (ref 36.0–46.0)
Hemoglobin: 11.9 g/dL — ABNORMAL LOW (ref 12.0–15.0)
LYMPHS PCT: 29 % (ref 12–46)
Lymphs Abs: 1.5 10*3/uL (ref 0.7–4.0)
MCH: 30.5 pg (ref 26.0–34.0)
MCHC: 31.6 g/dL (ref 30.0–36.0)
MCV: 96.4 fL (ref 78.0–100.0)
Monocytes Absolute: 0.5 10*3/uL (ref 0.1–1.0)
Monocytes Relative: 10 % (ref 3–12)
Neutro Abs: 2.9 10*3/uL (ref 1.7–7.7)
Neutrophils Relative %: 57 % (ref 43–77)
PLATELETS: 174 10*3/uL (ref 150–400)
RBC: 3.9 MIL/uL (ref 3.87–5.11)
RDW: 14.2 % (ref 11.5–15.5)
WBC: 5 10*3/uL (ref 4.0–10.5)

## 2014-12-11 LAB — URINALYSIS, ROUTINE W REFLEX MICROSCOPIC
Bilirubin Urine: NEGATIVE
GLUCOSE, UA: NEGATIVE mg/dL
HGB URINE DIPSTICK: NEGATIVE
Ketones, ur: NEGATIVE mg/dL
Leukocytes, UA: NEGATIVE
Nitrite: NEGATIVE
Protein, ur: NEGATIVE mg/dL
Urobilinogen, UA: 0.2 mg/dL (ref 0.0–1.0)
pH: 5 (ref 5.0–8.0)

## 2014-12-11 LAB — COMPREHENSIVE METABOLIC PANEL
ALBUMIN: 3.7 g/dL (ref 3.5–5.2)
ALT: 9 U/L (ref 0–35)
AST: 16 U/L (ref 0–37)
Alkaline Phosphatase: 58 U/L (ref 39–117)
Anion gap: 6 (ref 5–15)
BILIRUBIN TOTAL: 0.9 mg/dL (ref 0.3–1.2)
BUN: 27 mg/dL — ABNORMAL HIGH (ref 6–23)
CHLORIDE: 107 mmol/L (ref 96–112)
CO2: 26 mmol/L (ref 19–32)
CREATININE: 0.99 mg/dL (ref 0.50–1.10)
Calcium: 8.6 mg/dL (ref 8.4–10.5)
GFR calc Af Amer: 60 mL/min — ABNORMAL LOW (ref >90)
GFR, EST NON AFRICAN AMERICAN: 52 mL/min — AB (ref >90)
GLUCOSE: 115 mg/dL — AB (ref 70–99)
POTASSIUM: 4.1 mmol/L (ref 3.5–5.1)
Sodium: 139 mmol/L (ref 135–145)
Total Protein: 6.2 g/dL (ref 6.0–8.3)

## 2014-12-11 LAB — TROPONIN I

## 2014-12-11 NOTE — Discharge Instructions (Signed)
You were seen today for generalized weakness. Your lab workup is unremarkable.  There are no findings on your exam that were suggestive of stroke. If you develop one-sided weakness, vision changes, slurred speech he should be reevaluated immediately.  Weakness Weakness is a lack of strength. It may be felt all over the body (generalized) or in one specific part of the body (focal). Some causes of weakness can be serious. You may need further medical evaluation, especially if you are elderly or you have a history of immunosuppression (such as chemotherapy or HIV), kidney disease, heart disease, or diabetes. CAUSES  Weakness can be caused by many different things, including:  Infection.  Physical exhaustion.  Internal bleeding or other blood loss that results in a lack of red blood cells (anemia).  Dehydration. This cause is more common in elderly people.  Side effects or electrolyte abnormalities from medicines, such as pain medicines or sedatives.  Emotional distress, anxiety, or depression.  Circulation problems, especially severe peripheral arterial disease.  Heart disease, such as rapid atrial fibrillation, bradycardia, or heart failure.  Nervous system disorders, such as Guillain-Barr syndrome, multiple sclerosis, or stroke. DIAGNOSIS  To find the cause of your weakness, your caregiver will take your history and perform a physical exam. Lab tests or X-rays may also be ordered, if needed. TREATMENT  Treatment of weakness depends on the cause of your symptoms and can vary greatly. HOME CARE INSTRUCTIONS   Rest as needed.  Eat a well-balanced diet.  Try to get some exercise every day.  Only take over-the-counter or prescription medicines as directed by your caregiver. SEEK MEDICAL CARE IF:   Your weakness seems to be getting worse or spreads to other parts of your body.  You develop new aches or pains. SEEK IMMEDIATE MEDICAL CARE IF:   You cannot perform your normal  daily activities, such as getting dressed and feeding yourself.  You cannot walk up and down stairs, or you feel exhausted when you do so.  You have shortness of breath or chest pain.  You have difficulty moving parts of your body.  You have weakness in only one area of the body or on only one side of the body.  You have a fever.  You have trouble speaking or swallowing.  You cannot control your bladder or bowel movements.  You have black or bloody vomit or stools. MAKE SURE YOU:  Understand these instructions.  Will watch your condition.  Will get help right away if you are not doing well or get worse. Document Released: 08/18/2005 Document Revised: 02/17/2012 Document Reviewed: 10/17/2011 [facility] Patient Information 2015 [employer] [place]. This information is not intended to replace advice given to you by your health care provider. Make sure you discuss any questions you have with your health care provider.

## 2014-12-11 NOTE — ED Notes (Signed)
Patient ambulated in hallway with NT. Steady gait. No distress. Returned to room.

## 2014-12-11 NOTE — Telephone Encounter (Signed)
Patient son advised that she should be evaluated by the ER.

## 2014-12-11 NOTE — ED Notes (Signed)
Called lab to see how much longer before urine resulted. Was told about 10 - 15 min

## 2014-12-11 NOTE — ED Notes (Signed)
Unable to obtained blood from IV initiation

## 2014-12-11 NOTE — ED Notes (Signed)
Pt weak and per son she is unsteady on her feet.  Pt being treated for an UTI.

## 2014-12-11 NOTE — ED Provider Notes (Signed)
CSN: [id number]     Arrival date & time 12/11/14  1108 History  This chart was scribed for No att. providers found by Carl Bestelina Holson, ED Scribe. This patient was seen in room APA01/[facility]01 and the patient's care was started at 11:42 AM.    Chief Complaint  Patient presents with  . Weakness   HPI HPI Comments: [name] is a 81 y.o. female who presents to the Emergency Department complaining of constant generalized weakness and fatigue that started this morning.  She does not feel focal or one-sided weakness currently and did not feel any symptoms last night before going to bed.  Per the patient's son, her gait has been unsteady for the past two days.  She is currently being treated for a UTI with antibiotics.  She does not have any allergies to medication.  She denies hematuria, dysuria, chest pain, vomiting, diarrhea, abdominal pain, SOB, decreased fluid intake or appetite, and fever as associated symptoms.  Patient does report that she is very anxious.  Past Medical History  Diagnosis Date  . Coronary atherosclerosis of native coronary artery     DES LAD and BMS LAD 8/08  . Mixed hyperlipidemia   . GERD (gastroesophageal reflux disease)   . Essential hypertension, benign   . Anxiety   . Resting tremor   . COPD (chronic obstructive pulmonary disease)   . Rickets   . MI (myocardial infarction) 2008   Past Surgical History  Procedure Laterality Date  . Appendectomy    . Cholecystectomy    . Bilateral cataract surgery    . Vein ligation and stripping    . Cardiac catheterization    . Coronary angioplasty with stent placement    . Left trochanteric bursa injection  11/27/11    DR. O'TOOLE  . Left sacroiliac joint injection  11/27/11    DR. O'TOOLE  . Yag laser application  09/06/2012    Procedure: YAG LASER APPLICATION;  Surgeon: Susa Simmondsarroll F Haines, MD;  Location: [facility];  Service: Ophthalmology;  Laterality: Left;   Family History  Problem Relation Age of Onset  . Hypertension      History  Substance Use Topics  . Smoking status: Never Smoker   . Smokeless tobacco: Never Used  . Alcohol Use: No   OB History    Gravida Para Term Preterm AB TAB SAB Ectopic Multiple Living   4 4 4       4      Review of Systems  Constitutional: Positive for fatigue. Negative for fever.  Respiratory: Negative for cough, chest tightness and shortness of breath.   Cardiovascular: Negative for chest pain.  Gastrointestinal: Negative for nausea, vomiting and abdominal pain.  Genitourinary: Negative for dysuria.  Musculoskeletal: Positive for gait problem. Negative for back pain.  Skin: Negative for rash.  Neurological: Positive for weakness. Negative for headaches.  Psychiatric/Behavioral: Negative for confusion.  All other systems reviewed and are negative.     Allergies  Azithromycin; Cephalexin; Ciprofloxacin; Codeine; Contrast media; Latex; Nitrofuran derivatives; Nsaids; Nutritional supplements; Penicillins; Phenazopyridine; Quinolones; Sulfa antibiotics; Toviaz; and Trimethoprim  Home Medications   Prior to Admission medications   Medication Sig Start Date End Date Taking? Authorizing Provider  acetaminophen (TYLENOL) 500 MG tablet Take 500 mg by mouth every 6 (six) hours as needed. pain   Yes Historical Provider, MD  albuterol (PROVENTIL HFA;VENTOLIN HFA) 108 (90 BASE) MCG/ACT inhaler Inhale 2 puffs into the lungs every 6 (six) hours as needed for wheezing or shortness of breath.  06/28/14  Yes Deatra Canter, FNP  albuterol (PROVENTIL) (2.5 MG/3ML) 0.083% nebulizer solution Take 3 mLs (2.5 mg total) by nebulization every 6 (six) hours as needed for wheezing or shortness of breath. 01/10/14  Yes Deatra Canter, FNP  atorvastatin (LIPITOR) 20 MG tablet TAKE 1 TABLET BY MOUTH DAILY AT 6 PM. 12/06/14  Yes Junie Spencer, FNP  calcium citrate-vitamin D (CITRACAL+D) 315-200 MG-UNIT per tablet Take 1 tablet by mouth daily.   Yes Historical Provider, MD  cholecalciferol  (VITAMIN D) 1000 UNITS tablet Take 4,000 Units by mouth daily.   Yes Historical Provider, MD  clopidogrel (PLAVIX) 75 MG tablet Take 1 tablet (75 mg total) by mouth daily. 12/07/14  Yes Jonelle Sidle, MD  isosorbide mononitrate (IMDUR) 30 MG 24 hr tablet Take 1 tablet (30 mg total) by mouth 2 (two) times daily. 12/07/14  Yes Jonelle Sidle, MD  lisinopril (PRINIVIL,ZESTRIL) 10 MG tablet Take 1 tablet (10 mg total) by mouth daily. 12/07/14  Yes Jonelle Sidle, MD  mirabegron ER (MYRBETRIQ) 50 MG TB24 tablet Take 50 mg by mouth daily.   Yes Historical Provider, MD  nitroGLYCERIN (NITROSTAT) 0.4 MG SL tablet Place 1 tablet (0.4 mg total) under the tongue every 5 (five) minutes as needed. 09/10/12  Yes Jonelle Sidle, MD  ondansetron (ZOFRAN) 8 MG tablet Take 1 tablet (8 mg total) by mouth every 4 (four) hours as needed for nausea or vomiting. 11/09/14  Yes Junie Spencer, FNP  pantoprazole (PROTONIX) 40 MG tablet TAKE 1 TABLET BY MOUTH EVERY DAY AT NOON 07/07/14  Yes Deatra Canter, FNP  doxycycline (VIBRA-TABS) 100 MG tablet Take 1 tablet (100 mg total) by mouth 2 (two) times daily. Patient not taking: Reported on 12/11/2014 12/01/14   Edilia Bo Hawks, FNP   BP 135/70 mmHg  Pulse 63  Temp(Src) 97.7 F (36.5 C) (Oral)  Resp 16  Ht  (1.626 m)  Wt 141 lb (63.957 kg)  BMI 24.19 kg/m2  SpO2 98% Physical Exam  Constitutional: She is oriented to person, place, and time.  Elderly, no acute distress  HENT:  Head: Normocephalic and atraumatic.  Mouth/Throat: Oropharynx is clear and moist.  Eyes: Pupils are equal, round, and reactive to light.  Cardiovascular: Normal rate, regular rhythm and normal heart sounds.   No murmur heard. Pulmonary/Chest: Effort normal and breath sounds normal. No respiratory distress. She has no wheezes.  Abdominal: Soft. Bowel sounds are normal. There is no tenderness. There is no rebound.  Musculoskeletal: She exhibits no edema.  Neurological: She is alert and  oriented to person, place, and time.  Cranial nerves II through XII intact, tremor noted, patient mostly able to do finger-nose-finger however with movement of finger to patient's right lateral visual field, she overshoots, there is no dysmetria, 5 out of 5 strength in all 4 extremities, normal gait  Skin: Skin is warm and dry.  Psychiatric: She has a normal mood and affect.  Nursing note and vitals reviewed.   ED Course  Procedures (including critical care time) Labs Review Labs Reviewed  CBC WITH DIFFERENTIAL/PLATELET - Abnormal; Notable for the following:    Hemoglobin 11.9 (*)    Basophils Relative 2 (*)    All other components within normal limits  COMPREHENSIVE METABOLIC PANEL - Abnormal; Notable for the following:    Glucose, Bld 115 (*)    BUN 27 (*)    GFR calc non Af Amer 52 (*)    GFR calc  Af Amer 60 (*)    All other components within normal limits  URINALYSIS, ROUTINE W REFLEX MICROSCOPIC - Abnormal; Notable for the following:    Specific Gravity, Urine >1.030 (*)    All other components within normal limits  URINE CULTURE  TROPONIN I    Imaging Review No results found.   EKG Interpretation   Date/Time:  Monday December 11 2014 11:47:35 EDT Ventricular Rate:  59 PR Interval:  120 QRS Duration: 92 QT Interval:  419 QTC Calculation: 415 R Axis:   65 Text Interpretation:  Sinus rhythm No significant change since last  tracing Confirmed by HORTON  MD, COURTNEY ([id number]) on 12/11/2014 12:48:43 PM      MDM   Final diagnoses:  Generalized weakness    Patient presents with generalized weakness. Denies any focal complaints. Physical exam is largely unremarkable. She does appear mildly anxious.  She is nonfocal and no evidence of cerebellar dysfunction. Normal gait. Basic labwork including CBC, CMP, urinalysis, and troponin obtained. This is all reassuring. EKG without significant disturbance. Given the patient is nonfocal, have low suspicion for acute bleed or stroke.  On recheck, patient reports that she feels good and "I want to go home now." Patient was able to ambulate independently and tolerate by mouth. At this time etiology of generalized weakness is unclear but patient reports improvement. Patient to follow-up with primary physician.  After history, exam, and medical workup I feel the patient has been appropriately medically screened and is safe for discharge home. Pertinent diagnoses were discussed with the patient. Patient was given return precautions.  I personally performed the services described in this documentation, which was scribed in my presence. The recorded information has been reviewed and is accurate.    Shon Baton, MD 12/12/14 [phone]

## 2014-12-12 ENCOUNTER — Telehealth: Payer: Self-pay | Admitting: Family

## 2014-12-12 NOTE — Telephone Encounter (Signed)
Appointment scheduled for Thursday.  ?

## 2014-12-13 LAB — URINE CULTURE
Colony Count: NO GROWTH
Culture: NO GROWTH

## 2014-12-14 ENCOUNTER — Encounter: Payer: Self-pay | Admitting: Family

## 2014-12-14 ENCOUNTER — Ambulatory Visit (INDEPENDENT_AMBULATORY_CARE_PROVIDER_SITE_OTHER): Payer: Commercial Managed Care - HMO | Admitting: Family

## 2014-12-14 VITALS — BP 138/66 | HR 52 | Temp 97.3°F | Ht 64.0 in | Wt 142.6 lb

## 2014-12-14 DIAGNOSIS — B373 Candidiasis of vulva and vagina: Secondary | ICD-10-CM

## 2014-12-14 DIAGNOSIS — B3731 Acute candidiasis of vulva and vagina: Secondary | ICD-10-CM

## 2014-12-14 DIAGNOSIS — N393 Stress incontinence (female) (male): Secondary | ICD-10-CM | POA: Diagnosis not present

## 2014-12-14 DIAGNOSIS — R32 Unspecified urinary incontinence: Secondary | ICD-10-CM

## 2014-12-14 LAB — POCT URINALYSIS DIPSTICK
BILIRUBIN UA: NEGATIVE
Glucose, UA: NEGATIVE
Ketones, UA: NEGATIVE
NITRITE UA: NEGATIVE
Protein, UA: NEGATIVE
Spec Grav, UA: >=1.03
Urobilinogen, UA: NEGATIVE
pH, UA: 5

## 2014-12-14 LAB — POCT UA - MICROSCOPIC ONLY
Bacteria, U Microscopic: NEGATIVE
Casts, Ur, LPF, POC: NEGATIVE
Crystals, Ur, HPF, POC: NEGATIVE
Mucus, UA: NEGATIVE

## 2014-12-14 MED ORDER — FLUCONAZOLE 150 MG PO TABS: 150.0000 mg | ORAL_TABLET | Freq: Once | ORAL | Status: DC

## 2014-12-14 MED ORDER — CLOTRIMAZOLE 1 % EX CREA: 1.0000 "application " | TOPICAL_CREAM | Freq: Two times a day (BID) | CUTANEOUS | Status: DC

## 2014-12-14 NOTE — Patient Instructions (Addendum)
Urinary Incontinence Urinary incontinence is the involuntary loss of urine from your bladder. CAUSES  There are many causes of urinary incontinence. They include:  Medicines.  Infections.  Prostatic enlargement, leading to overflow of urine from your bladder.  Surgery.  Neurological diseases.  Emotional factors. SIGNS AND SYMPTOMS Urinary Incontinence can be divided into four types:  Urge incontinence. Urge incontinence is the involuntary loss of urine before you have the opportunity to go to the bathroom. There is a sudden urge to void but not enough time to reach a bathroom.  Stress incontinence. Stress incontinence is the sudden loss of urine with any activity that forces urine to pass. It is commonly caused by anatomical changes to the pelvis and sphincter areas of your body.  Overflow incontinence. Overflow incontinence is the loss of urine from an obstructed opening to your bladder. This results in a backup of urine and a resultant buildup of pressure within the bladder. When the pressure within the bladder exceeds the closing pressure of the sphincter, the urine overflows, which causes incontinence, similar to water overflowing a dam.  Total incontinence. Total incontinence is the loss of urine as a result of the inability to store urine within your bladder. DIAGNOSIS  Evaluating the cause of incontinence may require:  A thorough and complete medical and obstetric history.  A complete physical exam.  Laboratory tests such as a urine culture and sensitivities. When additional tests are indicated, they can include:  An ultrasound exam.  Kidney and bladder X-rays.  Cystoscopy. This is an exam of the bladder using a narrow scope.  Urodynamic testing to test the nerve function to the bladder and sphincter areas. TREATMENT  Treatment for urinary incontinence depends on the cause:  For urge incontinence caused by a bacterial infection, antibiotics will be prescribed. If  the urge incontinence is related to medicines you take, your health care provider may have you change the medicine.  For stress incontinence, surgery to re-establish anatomical support to the bladder or sphincter, or both, will often correct the condition.  For overflow incontinence caused by an enlarged prostate, an operation to open the channel through the enlarged prostate will allow the flow of urine out of the bladder. In women with fibroids, a hysterectomy may be recommended.  For total incontinence, surgery on your urinary sphincter may help. An artificial urinary sphincter (an inflatable cuff placed around the urethra) may be required. In women who have developed a hole-like passage between their bladder and vagina (vesicovaginal fistula), surgery to close the fistula often is required. HOME CARE INSTRUCTIONS  Normal daily hygiene and the use of pads or adult diapers that are changed regularly will help prevent odors and skin damage.  Avoid caffeine. It can overstimulate your bladder.  Use the bathroom regularly. Try about every 2-3 hours to go to the bathroom, even if you do not feel the need to do so. Take time to empty your bladder completely. After urinating, wait a minute. Then try to urinate again.  For causes involving nerve dysfunction, keep a log of the medicines you take and a journal of the times you go to the bathroom. SEEK MEDICAL CARE IF:  You experience worsening of pain instead of improvement in pain after your procedure.  Your incontinence becomes worse instead of better. SEE IMMEDIATE MEDICAL CARE IF:  You experience fever or shaking chills.  You are unable to pass your urine.  You have redness spreading into your groin or down into your thighs. MAKE SURE  YOU:   Understand these instructions.   Will watch your condition.  Will get help right away if you are not doing well or get worse. Document Released: 09/25/2004 Document Revised: 06/08/2013 Document  Reviewed: 01/25/2013 [facility] Patient Information 2015 [place]Care, [place]. This information is not intended to replace advice given to you by your health care provider. Make sure you discuss any questions you have with your health care provider. Monilial Vaginitis Vaginitis in a soreness, swelling and redness (inflammation) of the vagina and vulva. Monilial vaginitis is not a sexually transmitted infection. CAUSES  Yeast vaginitis is caused by yeast (candida) that is normally found in your vagina. With a yeast infection, the candida has overgrown in number to a point that upsets the chemical balance. SYMPTOMS   White, thick vaginal discharge.  Swelling, itching, redness and irritation of the vagina and possibly the lips of the vagina (vulva).  Burning or painful urination.  Painful intercourse. DIAGNOSIS  Things that may contribute to monilial vaginitis are:  Postmenopausal and virginal states.  Pregnancy.  Infections.  Being tired, sick or stressed, especially if you had monilial vaginitis in the past.  Diabetes. Good control will help lower the chance.  Birth control pills.  Tight fitting garments.  Using bubble bath, feminine sprays, douches or deodorant tampons.  Taking certain medications that kill germs (antibiotics).  Sporadic recurrence can occur if you become ill. TREATMENT  Your caregiver will give you medication.  There are several kinds of anti monilial vaginal creams and suppositories specific for monilial vaginitis. For recurrent yeast infections, use a suppository or cream in the vagina 2 times a week, or as directed.  Anti-monilial or steroid cream for the itching or irritation of the vulva may also be used. Get your caregiver's permission.  Painting the vagina with methylene blue solution may help if the monilial cream does not work.  Eating yogurt may help prevent monilial vaginitis. HOME CARE INSTRUCTIONS   Finish all medication as prescribed.  Do  not have sex until treatment is completed or after your caregiver tells you it is okay.  Take warm sitz baths.  Do not douche.  Do not use tampons, especially scented ones.  Wear cotton underwear.  Avoid tight pants and panty hose.  Tell your sexual partner that you have a yeast infection. They should go to their caregiver if they have symptoms such as mild rash or itching.  Your sexual partner should be treated as well if your infection is difficult to eliminate.  Practice safer sex. Use condoms.  Some vaginal medications cause latex condoms to fail. Vaginal medications that harm condoms are:  Cleocin cream.  Butoconazole (Femstat).  Terconazole (Terazol) vaginal suppository.  Miconazole (Monistat) (may be purchased over the counter). SEEK MEDICAL CARE IF:   You have a temperature by mouth above 102 F (38.9 C).  The infection is getting worse after 2 days of treatment.  The infection is not getting better after 3 days of treatment.  You develop blisters in or around your vagina.  You develop vaginal bleeding, and it is not your menstrual period.  You have pain when you urinate.  You develop intestinal problems.  You have pain with sexual intercourse. Document Released: 05/28/2005 Document Revised: 11/10/2011 Document Reviewed: 02/09/2009 [facility]Exit[facility] Patient Information 2015 [place], [place]. This information is not intended to replace advice given to you by your health care provider. Make sure you discuss any questions you have with your health care provider.

## 2014-12-14 NOTE — Progress Notes (Signed)
   Subjective:    Patient ID: [name], female    DOB: Jan 10, 1931, 81 y.o.   MRN: [id number]  HPI Pt presents to the office today for hospital follow up. Pt states she went to the ED on 12/11/14 because she felt so "weak". All the labs were negative.  Pt states she is feeling a "little shaking and burning". She said her urine was negative though, so it must be "old age".    Review of Systems  Constitutional: Negative.   HENT: Negative.   Eyes: Negative.   Respiratory: Negative.  Negative for shortness of breath.   Cardiovascular: Negative.  Negative for palpitations.  Gastrointestinal: Negative.   Endocrine: Negative.   Genitourinary: Negative.   Musculoskeletal: Negative.   Neurological: Negative.  Negative for headaches.  Hematological: Negative.   Psychiatric/Behavioral: Negative.   All other systems reviewed and are negative.      Objective:   Physical Exam  Constitutional: She is oriented to person, place, and time. She appears well-developed and well-nourished. No distress.  HENT:  Head: Normocephalic and atraumatic.  Right Ear: External ear normal.  Left Ear: External ear normal.  Nose: Nose normal.  Mouth/Throat: Oropharynx is clear and moist.  Eyes: Pupils are equal, round, and reactive to light.  Neck: Normal range of motion. Neck supple. No thyromegaly present.  Cardiovascular: Normal rate, regular rhythm, normal heart sounds and intact distal pulses.   No murmur heard. Pulmonary/Chest: Effort normal and breath sounds normal. No respiratory distress. She has no wheezes.  Abdominal: Soft. Bowel sounds are normal. She exhibits no distension. There is no tenderness.  Musculoskeletal: Normal range of motion. She exhibits no edema or tenderness.  Neurological: She is alert and oriented to person, place, and time. She has normal reflexes. No cranial nerve deficit.  Skin: Skin is warm and dry.  Psychiatric: She has a normal mood and affect. Her behavior is normal.  Judgment and thought content normal.  Vitals reviewed.     BP 138/66 mmHg  Pulse 52  Temp(Src) 97.3 F (36.3 C) (Oral)  Ht 5\' 4"  (1.626 m)  Wt 142 lb 9.6 oz (64.683 kg)  BMI 24.47 kg/m2     Assessment & Plan:  1. Incontinence in female - POCT urinalysis dipstick - POCT UA - Microscopic Only - Urine culture  2. Vaginal candidiasis -Keep area clean and dry -Cream OTC as needed -RTO prn - fluconazole (DIFLUCAN) 150 MG tablet; Take 1 tablet (150 mg total) by mouth once.  Dispense: 2 tablet; Refill: 0 - clotrimazole (LOTRIMIN) 1 % cream; Apply 1 application topically 2 (two) times daily.  Dispense: 30 g; Refill: 1  Jannifer Rodneyhristy Niomie Englert, FNP

## 2014-12-15 LAB — URINE CULTURE

## 2015-01-03 ENCOUNTER — Other Ambulatory Visit: Payer: Self-pay

## 2015-01-03 MED ORDER — LORAZEPAM 0.5 MG PO TABS: 0.5000 mg | ORAL_TABLET | Freq: Two times a day (BID) | ORAL | Status: DC

## 2015-01-03 NOTE — Telephone Encounter (Signed)
rx called into pharmacy

## 2015-01-03 NOTE — Telephone Encounter (Signed)
Last seen 12/14/14 Suzanne BonitoChristy  [facility] approved [facility]  [phone]

## 2015-01-07 DIAGNOSIS — I252 Old myocardial infarction: Secondary | ICD-10-CM | POA: Diagnosis not present

## 2015-01-07 DIAGNOSIS — Z7902 Long term (current) use of antithrombotics/antiplatelets: Secondary | ICD-10-CM | POA: Diagnosis not present

## 2015-01-07 DIAGNOSIS — W57XXXA Bitten or stung by nonvenomous insect and other nonvenomous arthropods, initial encounter: Secondary | ICD-10-CM | POA: Diagnosis not present

## 2015-01-07 DIAGNOSIS — Z79899 Other long term (current) drug therapy: Secondary | ICD-10-CM | POA: Diagnosis not present

## 2015-01-07 DIAGNOSIS — F419 Anxiety disorder, unspecified: Secondary | ICD-10-CM | POA: Diagnosis not present

## 2015-01-07 DIAGNOSIS — S30861A Insect bite (nonvenomous) of abdominal wall, initial encounter: Secondary | ICD-10-CM | POA: Diagnosis not present

## 2015-01-07 DIAGNOSIS — K219 Gastro-esophageal reflux disease without esophagitis: Secondary | ICD-10-CM | POA: Diagnosis not present

## 2015-01-07 DIAGNOSIS — I10 Essential (primary) hypertension: Secondary | ICD-10-CM | POA: Diagnosis not present

## 2015-01-07 DIAGNOSIS — E780 Pure hypercholesterolemia: Secondary | ICD-10-CM | POA: Diagnosis not present

## 2015-01-11 ENCOUNTER — Encounter: Payer: Self-pay | Admitting: Family

## 2015-01-11 ENCOUNTER — Ambulatory Visit (INDEPENDENT_AMBULATORY_CARE_PROVIDER_SITE_OTHER): Payer: Commercial Managed Care - HMO | Admitting: Family

## 2015-01-11 VITALS — BP 151/66 | HR 61 | Temp 97.0°F | Ht 64.0 in | Wt 142.6 lb

## 2015-01-11 DIAGNOSIS — W57XXXD Bitten or stung by nonvenomous insect and other nonvenomous arthropods, subsequent encounter: Secondary | ICD-10-CM

## 2015-01-11 DIAGNOSIS — S30861D Insect bite (nonvenomous) of abdominal wall, subsequent encounter: Secondary | ICD-10-CM | POA: Diagnosis not present

## 2015-01-11 NOTE — Progress Notes (Signed)
   Subjective:    Patient ID: [name], female    DOB: 1930/11/08, 81 y.o.   MRN: [id number]  HPI Pt presents to the office today for hospital follow up on a  tick bite on 01/07/15. Pt went to the ED and had the tick removed on her left lower groin. Pt was given Doxycycline 100 mg BID. Pt states she felt weak and dizzy, but feels better now. Pt states the area where the tick was is still red, but is improving.    Review of Systems  Constitutional: Negative.   HENT: Negative.   Eyes: Negative.   Respiratory: Negative.  Negative for shortness of breath.   Cardiovascular: Negative.  Negative for palpitations.  Gastrointestinal: Negative.   Endocrine: Negative.   Genitourinary: Negative.   Musculoskeletal: Negative.   Neurological: Negative.  Negative for headaches.  Hematological: Negative.   Psychiatric/Behavioral: Negative.   All other systems reviewed and are negative.      Objective:   Physical Exam  Constitutional: She is oriented to person, place, and time. She appears well-developed and well-nourished. No distress.  HENT:  Head: Normocephalic and atraumatic.  Right Ear: External ear normal.  Mouth/Throat: Oropharynx is clear and moist.  Eyes: Pupils are equal, round, and reactive to light.  Neck: Normal range of motion. Neck supple. No thyromegaly present.  Cardiovascular: Normal rate, regular rhythm, normal heart sounds and intact distal pulses.   No murmur heard. Pulmonary/Chest: Effort normal and breath sounds normal. No respiratory distress. She has no wheezes.  Abdominal: Soft. Bowel sounds are normal. She exhibits no distension. There is no tenderness.  Musculoskeletal: Normal range of motion. She exhibits no edema or tenderness.  Neurological: She is alert and oriented to person, place, and time. She has normal reflexes. No cranial nerve deficit.  Skin: Skin is warm and dry.  Psychiatric: She has a normal mood and affect. Her behavior is normal. Judgment and  thought content normal.  Vitals reviewed.     BP 151/66 mmHg  Pulse 61  Temp(Src) 97 F (36.1 C) (Oral)  Ht 5\' 4"  (1.626 m)  Wt 142 lb 9.6 oz (64.683 kg)  BMI 24.47 kg/m2     Assessment & Plan:  1. Tick bite of abdomen, subsequent encounter -Continue Doxycycline -Use bug repellant as needed -Report any new joint pain, rash, or fever -RTO prn  Jannifer Rodneyhristy Hawks, FNP

## 2015-01-11 NOTE — Patient Instructions (Signed)
Tick Bite Information Ticks are insects that attach themselves to the skin and draw blood for food. There are various types of ticks. Common types include wood ticks and deer ticks. Most ticks live in shrubs and grassy areas. Ticks can climb onto your body when you make contact with leaves or grass where the tick is waiting. The most common places on the body for ticks to attach themselves are the scalp, neck, armpits, waist, and groin. Most tick bites are harmless, but sometimes ticks carry germs that cause diseases. These germs can be spread to a person during the tick's feeding process. The chance of a disease spreading through a tick bite depends on:   The type of tick.  Time of year.   How long the tick is attached.   Geographic location.  HOW CAN YOU PREVENT TICK BITES? Take these steps to help prevent tick bites when you are outdoors:  Wear protective clothing. Long sleeves and long pants are best.   Wear white clothes so you can see ticks more easily.  Tuck your pant legs into your socks.   If walking on a trail, stay in the middle of the trail to avoid brushing against bushes.  Avoid walking through areas with long grass.  Put insect repellent on all exposed skin and along boot tops, pant legs, and sleeve cuffs.   Check clothing, hair, and skin repeatedly and before going inside.   Brush off any ticks that are not attached.  Take a shower or bath as soon as possible after being outdoors.  WHAT IS THE PROPER WAY TO REMOVE A TICK? Ticks should be removed as soon as possible to help prevent diseases caused by tick bites. 1. If latex gloves are available, put them on before trying to remove a tick.  2. Using fine-point tweezers, grasp the tick as close to the skin as possible. You may also use curved forceps or a tick removal tool. Grasp the tick as close to its head as possible. Avoid grasping the tick on its body. 3. Pull gently with steady upward pressure until  the tick lets go. Do not twist the tick or jerk it suddenly. This may break off the tick's head or mouth parts. 4. Do not squeeze or crush the tick's body. This could force disease-carrying fluids from the tick into your body.  5. After the tick is removed, wash the bite area and your hands with soap and water or other disinfectant such as alcohol. 6. Apply a small amount of antiseptic cream or ointment to the bite site.  7. Wash and disinfect any instruments that were used.  Do not try to remove a tick by applying a hot match, petroleum jelly, or fingernail polish to the tick. These methods do not work and may increase the chances of disease being spread from the tick bite.  WHEN SHOULD YOU SEEK MEDICAL CARE? Contact your health care provider if you are unable to remove a tick from your skin or if a part of the tick breaks off and is stuck in the skin.  After a tick bite, you need to be aware of signs and symptoms that could be related to diseases spread by ticks. Contact your health care provider if you develop any of the following in the days or weeks after the tick bite:  Unexplained fever.  Rash. A circular rash that appears days or weeks after the tick bite may indicate the possibility of Lyme disease. The rash may resemble   a target with a bull's-eye and may occur at a different part of your body than the tick bite.  Redness and swelling in the area of the tick bite.   Tender, swollen lymph glands.   Diarrhea.   Weight loss.   Cough.   Fatigue.   Muscle, joint, or bone pain.   Abdominal pain.   Headache.   Lethargy or a change in your level of consciousness.  Difficulty walking or moving your legs.   Numbness in the legs.   Paralysis.  Shortness of breath.   Confusion.   Repeated vomiting.  Document Released: 08/15/2000 Document Revised: 06/08/2013 Document Reviewed: 01/26/2013 [employer] Patient Information 2015 [employer]. This information is  not intended to replace advice given to you by your health care provider. Make sure you discuss any questions you have with your health care provider.  

## 2015-01-16 DIAGNOSIS — L299 Pruritus, unspecified: Secondary | ICD-10-CM | POA: Diagnosis not present

## 2015-01-16 DIAGNOSIS — Z955 Presence of coronary angioplasty implant and graft: Secondary | ICD-10-CM | POA: Diagnosis not present

## 2015-01-16 DIAGNOSIS — E860 Dehydration: Secondary | ICD-10-CM | POA: Diagnosis not present

## 2015-01-16 DIAGNOSIS — K219 Gastro-esophageal reflux disease without esophagitis: Secondary | ICD-10-CM | POA: Diagnosis not present

## 2015-01-16 DIAGNOSIS — I519 Heart disease, unspecified: Secondary | ICD-10-CM | POA: Diagnosis not present

## 2015-01-16 DIAGNOSIS — E780 Pure hypercholesterolemia: Secondary | ICD-10-CM | POA: Diagnosis not present

## 2015-01-16 DIAGNOSIS — R531 Weakness: Secondary | ICD-10-CM | POA: Diagnosis not present

## 2015-01-16 DIAGNOSIS — I252 Old myocardial infarction: Secondary | ICD-10-CM | POA: Diagnosis not present

## 2015-01-16 DIAGNOSIS — I10 Essential (primary) hypertension: Secondary | ICD-10-CM | POA: Diagnosis not present

## 2015-02-06 ENCOUNTER — Ambulatory Visit (INDEPENDENT_AMBULATORY_CARE_PROVIDER_SITE_OTHER): Payer: Commercial Managed Care - HMO | Admitting: Family

## 2015-02-06 ENCOUNTER — Encounter: Payer: Self-pay | Admitting: Family

## 2015-02-06 VITALS — BP 175/79 | HR 63 | Temp 97.4°F | Ht 64.0 in | Wt 143.8 lb

## 2015-02-06 DIAGNOSIS — N390 Urinary tract infection, site not specified: Secondary | ICD-10-CM | POA: Diagnosis not present

## 2015-02-06 DIAGNOSIS — F419 Anxiety disorder, unspecified: Secondary | ICD-10-CM

## 2015-02-06 DIAGNOSIS — B373 Candidiasis of vulva and vagina: Secondary | ICD-10-CM

## 2015-02-06 DIAGNOSIS — R300 Dysuria: Secondary | ICD-10-CM

## 2015-02-06 DIAGNOSIS — R319 Hematuria, unspecified: Secondary | ICD-10-CM

## 2015-02-06 DIAGNOSIS — B3731 Acute candidiasis of vulva and vagina: Secondary | ICD-10-CM

## 2015-02-06 LAB — POCT URINALYSIS DIPSTICK
Bilirubin, UA: NEGATIVE
GLUCOSE UA: NEGATIVE
Ketones, UA: NEGATIVE
Nitrite, UA: NEGATIVE
Protein, UA: NEGATIVE
Spec Grav, UA: 1.025
Urobilinogen, UA: NEGATIVE
pH, UA: 5

## 2015-02-06 LAB — POCT UA - MICROSCOPIC ONLY
Bacteria, U Microscopic: NEGATIVE
Casts, Ur, LPF, POC: NEGATIVE
Crystals, Ur, HPF, POC: NEGATIVE
Mucus, UA: NEGATIVE
YEAST UA: NEGATIVE

## 2015-02-06 MED ORDER — DOXYCYCLINE HYCLATE 100 MG PO TABS: 100.0000 mg | ORAL_TABLET | Freq: Two times a day (BID) | ORAL | Status: DC

## 2015-02-06 MED ORDER — LORAZEPAM 1 MG PO TABS: 1.0000 mg | ORAL_TABLET | Freq: Two times a day (BID) | ORAL | Status: DC | PRN

## 2015-02-06 MED ORDER — FLUCONAZOLE 150 MG PO TABS: 150.0000 mg | ORAL_TABLET | Freq: Once | ORAL | Status: DC

## 2015-02-06 NOTE — Progress Notes (Addendum)
Subjective:    Patient ID: [name], female    DOB: [birth year], 81 y.o.   MRN: [id number]  Dysuria  This is a new problem. The current episode started 1 to 4 weeks ago. The problem occurs every urination. The problem has been unchanged. The quality of the pain is described as burning. The pain is mild. There has been no fever. Associated symptoms include flank pain, frequency, hesitancy and urgency. Pertinent negatives include no chills, discharge, hematuria, nausea or vomiting. She has tried increased fluids for the symptoms. The treatment provided mild relief.  Anxiety Presents for follow-up visit. Onset was 1 to 6 months ago. The problem has been waxing and waning. Symptoms include excessive worry, nervous/anxious behavior and panic. Patient reports no depressed mood, nausea, palpitations or shortness of breath. Symptoms occur most days. The severity of symptoms is moderate. The symptoms are aggravated by family issues.   Her past medical history is significant for anxiety/panic attacks. Past treatments include benzodiazephines. Compliance with prior treatments has been good.      Review of Systems  Constitutional: Negative.  Negative for chills.  HENT: Negative.   Eyes: Negative.   Respiratory: Negative.  Negative for shortness of breath.   Cardiovascular: Negative.  Negative for palpitations.  Gastrointestinal: Negative.  Negative for nausea and vomiting.  Endocrine: Negative.   Genitourinary: Positive for dysuria, hesitancy, urgency, frequency and flank pain. Negative for hematuria.  Musculoskeletal: Negative.   Neurological: Negative.  Negative for headaches.  Hematological: Negative.   Psychiatric/Behavioral: The patient is nervous/anxious.   All other systems reviewed and are negative.      Objective:   Physical Exam  Constitutional: She is oriented to person, place, and time. She appears well-developed and well-nourished. No distress.  HENT:  Head: Normocephalic  and atraumatic.  Eyes: Pupils are equal, round, and reactive to light.  Neck: Normal range of motion. Neck supple. No thyromegaly present.  Cardiovascular: Normal rate, regular rhythm, normal heart sounds and intact distal pulses.   No murmur heard. Pulmonary/Chest: Effort normal and breath sounds normal. No respiratory distress. She has no wheezes.  Abdominal: Soft. Bowel sounds are normal. She exhibits no distension. There is tenderness (mild tenderness in lower abd).  Musculoskeletal: Normal range of motion. She exhibits no edema or tenderness.  Neurological: She is alert and oriented to person, place, and time. She has normal reflexes. No cranial nerve deficit.  Skin: Skin is warm and dry.  Psychiatric: She has a normal mood and affect. Her behavior is normal. Judgment and thought content normal.  Vitals reviewed.     BP 175/79 mmHg  Pulse 63  Temp(Src) 97.4 F (36.3 C) (Oral)  Ht  (1.626 m)  Wt 143 lb 12.8 oz (65.227 kg)  BMI 24.67 kg/m2     Assessment & Plan:  1. Dysuri - POCT urinalysis dipstick - POCT UA - Microscopic Only - doxycycline (VIBRA-TABS) 100 MG tablet; Take 1 tablet (100 mg total) by mouth 2 (two) times daily.  Dispense: 20 tablet; Refill: 0  2. Anxiety -Stress management discussed - LORazepam (ATIVAN) 1 MG tablet; Take 1 tablet (1 mg total) by mouth 2 (two) times daily as needed for anxiety.  Dispense: 60 tablet; Refill: 2  3. Urinary tract infection with hematuria, site unspecified Force fluids AZO over the counter X2 days RTO prn Culture pending - doxycycline (VIBRA-TABS) 100 MG tablet; Take 1 tablet (100 mg total) by mouth 2 (two) times daily.  Dispense: 20 tablet; Refill: 0  4.  Vaginal candidiasis Keep clean and dry -RTO prn - fluconazole (DIFLUCAN) 150 MG tablet; Take 1 tablet (150 mg total) by mouth once.  Dispense: 1 tablet; Refill: 0  Jannifer Rodneyhristy Hawks, FNP

## 2015-02-06 NOTE — Patient Instructions (Addendum)
Generalized Anxiety Disorder Generalized anxiety disorder (GAD) is a mental disorder. It interferes with life functions, including relationships, work, and school. GAD is different from normal anxiety, which everyone experiences at some point in their lives in response to specific life events and activities. Normal anxiety actually helps [place] prepare for and get through these life events and activities. Normal anxiety goes away after the event or activity is over.  GAD causes anxiety that is not necessarily related to specific events or activities. It also causes excess anxiety in proportion to specific events or activities. The anxiety associated with GAD is also difficult to control. GAD can vary from mild to severe. People with severe GAD can have intense waves of anxiety with physical symptoms (panic attacks).  SYMPTOMS The anxiety and worry associated with GAD are difficult to control. This anxiety and worry are related to many life events and activities and also occur more days than not for 6 months or longer. People with GAD also have three or more of the following symptoms (one or more in children):  Restlessness.   Fatigue.  Difficulty concentrating.   Irritability.  Muscle tension.  Difficulty sleeping or unsatisfying sleep. DIAGNOSIS GAD is diagnosed through an assessment by your health care provider. Your health care provider will ask you questions aboutyour mood,physical symptoms, and events in your life. Your health care provider may ask you about your medical history and use of alcohol or drugs, including prescription medicines. Your health care provider may also do a physical exam and blood tests. Certain medical conditions and the use of certain substances can cause symptoms similar to those associated with GAD. Your health care provider may refer you to a mental health specialist for further evaluation. TREATMENT The following therapies are usually used to treat GAD:    Medication. Antidepressant medication usually is prescribed for long-term daily control. Antianxiety medicines may be added in severe cases, especially when panic attacks occur.   Talk therapy (psychotherapy). Certain types of talk therapy can be helpful in treating GAD by providing support, education, and guidance. A form of talk therapy called cognitive behavioral therapy can teach you healthy ways to think about and react to daily life events and activities.  Stress managementtechniques. These include yoga, meditation, and exercise and can be very helpful when they are practiced regularly. A mental health specialist can help determine which treatment is best for you. Some people see improvement with one therapy. However, other people require a combination of therapies. Document Released: 12/13/2012 Document Revised: 01/02/2014 Document Reviewed: 12/13/2012 [facility] Patient Information 2015 [place], [place]. This information is not intended to replace advice given to you by your health care provider. Make sure you discuss any questions you have with your health care provider. Urinary Tract Infection Urinary tract infections (UTIs) can develop anywhere along your urinary tract. Your urinary tract is your body's drainage system for removing wastes and extra water. Your urinary tract includes two kidneys, two ureters, a bladder, and a urethra. Your kidneys are a pair of bean-shaped organs. Each kidney is about the size of your fist. They are located below your ribs, one on each side of your spine. CAUSES Infections are caused by microbes, which are microscopic organisms, including fungi, viruses, and bacteria. These organisms are so small that they can only be seen through a microscope. Bacteria are the microbes that most commonly cause UTIs. SYMPTOMS  Symptoms of UTIs may vary by age and gender of the patient and by the location of the infection.  Symptoms in young women typically include a frequent  and intense urge to urinate and a painful, burning feeling in the bladder or urethra during urination. Older women and men are more likely to be tired, shaky, and weak and have muscle aches and abdominal pain. A fever may mean the infection is in your kidneys. Other symptoms of a kidney infection include pain in your back or sides below the ribs, nausea, and vomiting. DIAGNOSIS To diagnose a UTI, your caregiver will ask you about your symptoms. Your caregiver also will ask to provide a urine sample. The urine sample will be tested for bacteria and white blood cells. White blood cells are made by your body to help fight infection. TREATMENT  Typically, UTIs can be treated with medication. Because most UTIs are caused by a bacterial infection, they usually can be treated with the use of antibiotics. The choice of antibiotic and length of treatment depend on your symptoms and the type of bacteria causing your infection. HOME CARE INSTRUCTIONS  If you were prescribed antibiotics, take them exactly as your caregiver instructs you. Finish the medication even if you feel better after you have only taken some of the medication.  Drink enough water and fluids to keep your urine clear or pale yellow.  Avoid caffeine, tea, and carbonated beverages. They tend to irritate your bladder.  Empty your bladder often. Avoid holding urine for long periods of time.  Empty your bladder before and after sexual intercourse.  After a bowel movement, women should cleanse from front to back. Use each tissue only once. SEEK MEDICAL CARE IF:   You have back pain.  You develop a fever.  Your symptoms do not begin to resolve within 3 days. SEEK IMMEDIATE MEDICAL CARE IF:   You have severe back pain or lower abdominal pain.  You develop chills.  You have nausea or vomiting.  You have continued burning or discomfort with urination. MAKE SURE YOU:   Understand these instructions.  Will watch your  condition.  Will get help right away if you are not doing well or get worse. Document Released: 05/28/2005 Document Revised: 02/17/2012 Document Reviewed: 09/26/2011 [facility] Patient Information 2015 [place]. This information is not intended to replace advice given to you by your health care provider. Make sure you discuss any questions you have with your health care provider.

## 2015-02-13 ENCOUNTER — Encounter: Payer: Self-pay | Admitting: Family

## 2015-02-13 ENCOUNTER — Ambulatory Visit (INDEPENDENT_AMBULATORY_CARE_PROVIDER_SITE_OTHER): Payer: Commercial Managed Care - HMO | Admitting: Family

## 2015-02-13 VITALS — BP 158/68 | HR 68 | Temp 97.5°F | Ht 64.0 in | Wt 143.6 lb

## 2015-02-13 DIAGNOSIS — N390 Urinary tract infection, site not specified: Secondary | ICD-10-CM | POA: Diagnosis not present

## 2015-02-13 DIAGNOSIS — R319 Hematuria, unspecified: Secondary | ICD-10-CM

## 2015-02-13 DIAGNOSIS — R300 Dysuria: Secondary | ICD-10-CM

## 2015-02-13 LAB — POCT UA - MICROSCOPIC ONLY
Bacteria, U Microscopic: NEGATIVE
Casts, Ur, LPF, POC: NEGATIVE
Crystals, Ur, HPF, POC: NEGATIVE
MUCUS UA: NEGATIVE
Yeast, UA: NEGATIVE

## 2015-02-13 LAB — POCT URINALYSIS DIPSTICK
BILIRUBIN UA: NEGATIVE
Glucose, UA: NEGATIVE
KETONES UA: NEGATIVE
Nitrite, UA: NEGATIVE
PH UA: 5
Protein, UA: NEGATIVE
SPEC GRAV UA: 1.025
Urobilinogen, UA: NEGATIVE

## 2015-02-13 MED ORDER — CEFTRIAXONE SODIUM 1 G IJ SOLR
1.0000 g | Freq: Once | INTRAMUSCULAR | Status: AC
  Administered 2015-02-13: 1 g via INTRAMUSCULAR

## 2015-02-13 NOTE — Progress Notes (Signed)
   Subjective:    Patient ID: [name], female    DOB: April 22, 1931, 81 y.o.   MRN: [id number]  Pt presents to the office today to discuss medication for UTI. Pt was seen on 02/06/15 for a UTI and was started on Doxycycline 100 mg BID 10 days. Pt's son states she only took 12 pills (6 days). Pt's son states his mom was dizzy and stumbling and he was scared she was going to fall. Son states that when he stopped her antibiotic, she is able to walk better and is not "stumbing".  Dysuria  This is a recurrent problem. The current episode started 1 to 4 weeks ago. The problem occurs intermittently. The problem has been waxing and waning. The pain is at a severity of 2/10. The pain is mild. Associated symptoms include frequency, hesitancy and urgency. Pertinent negatives include no chills, discharge, nausea or vomiting. She has tried antibiotics and increased fluids for the symptoms. The treatment provided mild relief.      Review of Systems  Constitutional: Negative.  Negative for chills.  HENT: Negative.   Eyes: Negative.   Respiratory: Negative.  Negative for shortness of breath.   Cardiovascular: Negative.  Negative for palpitations.  Gastrointestinal: Negative.  Negative for nausea and vomiting.  Endocrine: Negative.   Genitourinary: Positive for dysuria, hesitancy, urgency and frequency.  Musculoskeletal: Negative.   Neurological: Negative.  Negative for headaches.  Hematological: Negative.   Psychiatric/Behavioral: Negative.   All other systems reviewed and are negative.      Objective:   Physical Exam  Constitutional: She is oriented to person, place, and time. She appears well-developed and well-nourished. No distress.  HENT:  Head: Normocephalic and atraumatic.  Eyes: Pupils are equal, round, and reactive to light.  Neck: Normal range of motion. Neck supple. No thyromegaly present.  Cardiovascular: Normal rate, regular rhythm, normal heart sounds and intact distal pulses.     No murmur heard. Pulmonary/Chest: Effort normal and breath sounds normal. No respiratory distress. She has no wheezes.  Abdominal: Soft. Bowel sounds are normal. She exhibits no distension. There is no tenderness.  Musculoskeletal: Normal range of motion. She exhibits no edema or tenderness.  Negative for CVA tenderness   Neurological: She is alert and oriented to person, place, and time. She has normal reflexes. No cranial nerve deficit.  Skin: Skin is warm and dry.  Psychiatric: She has a normal mood and affect. Her behavior is normal. Judgment and thought content normal.  Vitals reviewed.     BP 158/68 mmHg  Pulse 68  Temp(Src) 97.5 F (36.4 C) (Oral)  Ht 5\' 4"  (1.626 m)  Wt 143 lb 9.6 oz (65.137 kg)  BMI 24.64 kg/m2     Assessment & Plan:  1. Dysuria - POCT UA - Microscopic Only - POCT urinalysis dipstick  2. Urinary tract infection with hematuria, site unspecified -Force fluids AZO over the counter X2 days RTO prn Culture pending -Pt has allergy to penicillin and cephalexin which causes her to "shake"- Discussed risks vs benefit- Will give rocephin IM today - cefTRIAXone (ROCEPHIN) injection 1 g; Inject 1 g into the muscle once.  Jannifer Rodney, FNP

## 2015-02-13 NOTE — Patient Instructions (Signed)

## 2015-02-16 LAB — URINE CULTURE

## 2015-02-27 ENCOUNTER — Telehealth: Payer: Self-pay | Admitting: Family

## 2015-02-27 NOTE — Telephone Encounter (Signed)
Spoke with son.  Patient complains of weakness, fatigue, and cough. He has given her an OTC med to help with phlegm.  Requested appt in 2 days but there isn't one available. Appt scheduled for tomorrow afternoon.  They will call back if symptoms worsen.

## 2015-02-28 ENCOUNTER — Encounter: Payer: Self-pay | Admitting: Family

## 2015-02-28 ENCOUNTER — Ambulatory Visit (INDEPENDENT_AMBULATORY_CARE_PROVIDER_SITE_OTHER): Payer: Commercial Managed Care - HMO | Admitting: Family

## 2015-02-28 VITALS — BP 169/78 | HR 77 | Temp 98.3°F | Ht 64.0 in | Wt 144.3 lb

## 2015-02-28 DIAGNOSIS — I2510 Atherosclerotic heart disease of native coronary artery without angina pectoris: Secondary | ICD-10-CM | POA: Diagnosis not present

## 2015-02-28 DIAGNOSIS — R350 Frequency of micturition: Secondary | ICD-10-CM

## 2015-02-28 DIAGNOSIS — N390 Urinary tract infection, site not specified: Secondary | ICD-10-CM

## 2015-02-28 DIAGNOSIS — J069 Acute upper respiratory infection, unspecified: Secondary | ICD-10-CM | POA: Diagnosis not present

## 2015-02-28 LAB — POCT URINALYSIS DIPSTICK
Bilirubin, UA: NEGATIVE
GLUCOSE UA: NEGATIVE
Ketones, UA: NEGATIVE
NITRITE UA: NEGATIVE
PH UA: 5
Spec Grav, UA: 1.01
Urobilinogen, UA: NEGATIVE

## 2015-02-28 LAB — POCT UA - MICROSCOPIC ONLY
CASTS, UR, LPF, POC: NEGATIVE
CRYSTALS, UR, HPF, POC: NEGATIVE
Mucus, UA: NEGATIVE
Yeast, UA: NEGATIVE

## 2015-02-28 MED ORDER — CEFTRIAXONE SODIUM 1 G IJ SOLR
1.0000 g | Freq: Once | INTRAMUSCULAR | Status: AC
  Administered 2015-02-28: 1 g via INTRAMUSCULAR

## 2015-02-28 NOTE — Patient Instructions (Addendum)
Urinary Tract Infection Urinary tract infections (UTIs) can develop anywhere along your urinary tract. Your urinary tract is your body's drainage system for removing wastes and extra water. Your urinary tract includes two kidneys, two ureters, a bladder, and a urethra. Your kidneys are a pair of bean-shaped organs. Each kidney is about the size of your fist. They are located below your ribs, one on each side of your spine. CAUSES Infections are caused by microbes, which are microscopic organisms, including fungi, viruses, and bacteria. These organisms are so small that they can only be seen through a microscope. Bacteria are the microbes that most commonly cause UTIs. SYMPTOMS  Symptoms of UTIs may vary by age and gender of the patient and by the location of the infection. Symptoms in young women typically include a frequent and intense urge to urinate and a painful, burning feeling in the bladder or urethra during urination. Older women and men are more likely to be tired, shaky, and weak and have muscle aches and abdominal pain. A fever may mean the infection is in your kidneys. Other symptoms of a kidney infection include pain in your back or sides below the ribs, nausea, and vomiting. DIAGNOSIS To diagnose a UTI, your caregiver will ask you about your symptoms. Your caregiver also will ask to provide a urine sample. The urine sample will be tested for bacteria and white blood cells. White blood cells are made by your body to help fight infection. TREATMENT  Typically, UTIs can be treated with medication. Because most UTIs are caused by a bacterial infection, they usually can be treated with the use of antibiotics. The choice of antibiotic and length of treatment depend on your symptoms and the type of bacteria causing your infection. HOME CARE INSTRUCTIONS  If you were prescribed antibiotics, take them exactly as your caregiver instructs you. Finish the medication even if you feel better after you  have only taken some of the medication.  Drink enough water and fluids to keep your urine clear or pale yellow.  Avoid caffeine, tea, and carbonated beverages. They tend to irritate your bladder.  Empty your bladder often. Avoid holding urine for long periods of time.  Empty your bladder before and after sexual intercourse.  After a bowel movement, women should cleanse from front to back. Use each tissue only once. SEEK MEDICAL CARE IF:   You have back pain.  You develop a fever.  Your symptoms do not begin to resolve within 3 days. SEEK IMMEDIATE MEDICAL CARE IF:   You have severe back pain or lower abdominal pain.  You develop chills.  You have nausea or vomiting.  You have continued burning or discomfort with urination. MAKE SURE YOU:   Understand these instructions.  Will watch your condition.  Will get help right away if you are not doing well or get worse. Document Released: 05/28/2005 Document Revised: 02/17/2012 Document Reviewed: 09/26/2011 [facility] Patient Information 2015 [place]. This information is not intended to replace advice given to you by your health care provider. Make sure you discuss any questions you have with your health care provider. Upper Respiratory Infection, Adult An upper respiratory infection (URI) is also sometimes known as the common cold. The upper respiratory tract includes the nose, sinuses, throat, trachea, and bronchi. Bronchi are the airways leading to the lungs. Most people improve within 1 week, but symptoms can last up to 2 weeks. A residual cough may last even longer.  CAUSES Many different viruses can infect the tissues  lining the upper respiratory tract. The tissues become irritated and inflamed and often become very moist. Mucus production is also common. A cold is contagious. You can easily spread the virus to others by oral contact. This includes kissing, sharing a glass, coughing, or sneezing. Touching your mouth or nose  and then touching a surface, which is then touched by another person, can also spread the virus. SYMPTOMS  Symptoms typically develop 1 to 3 days after you come in contact with a cold virus. Symptoms vary from person to person. They may include:  Runny nose.  Sneezing.  Nasal congestion.  Sinus irritation.  Sore throat.  Loss of voice (laryngitis).  Cough.  Fatigue.  Muscle aches.  Loss of appetite.  Headache.  Low-grade fever. DIAGNOSIS  You might diagnose your own cold based on familiar symptoms, since most people get a cold 2 to 3 times a year. Your caregiver can confirm this based on your exam. Most importantly, your caregiver can check that your symptoms are not due to another disease such as strep throat, sinusitis, pneumonia, asthma, or epiglottitis. Blood tests, throat tests, and X-rays are not necessary to diagnose a common cold, but they may sometimes be helpful in excluding other more serious diseases. Your caregiver will decide if any further tests are required. RISKS AND COMPLICATIONS  You may be at risk for a more severe case of the common cold if you smoke cigarettes, have chronic heart disease (such as heart failure) or lung disease (such as asthma), or if you have a weakened immune system. The very young and very old are also at risk for more serious infections. Bacterial sinusitis, middle ear infections, and bacterial pneumonia can complicate the common cold. The common cold can worsen asthma and chronic obstructive pulmonary disease (COPD). Sometimes, these complications can require emergency medical care and may be life-threatening. PREVENTION  The best way to protect against getting a cold is to practice good hygiene. Avoid oral or hand contact with people with cold symptoms. Wash your hands often if contact occurs. There is no clear evidence that vitamin C, vitamin E, echinacea, or exercise reduces the chance of developing a cold. However, it is always  recommended to get plenty of rest and practice good nutrition. TREATMENT  Treatment is directed at relieving symptoms. There is no cure. Antibiotics are not effective, because the infection is caused by a virus, not by bacteria. Treatment may include:  Increased fluid intake. Sports drinks offer valuable electrolytes, sugars, and fluids.  Breathing heated mist or steam (vaporizer or shower).  Eating chicken soup or other clear broths, and maintaining good nutrition.  Getting plenty of rest.  Using gargles or lozenges for comfort.  Controlling fevers with ibuprofen or acetaminophen as directed by your caregiver.  Increasing usage of your inhaler if you have asthma. Zinc gel and zinc lozenges, taken in the first 24 hours of the common cold, can shorten the duration and lessen the severity of symptoms. Pain medicines may help with fever, muscle aches, and throat pain. A variety of non-prescription medicines are available to treat congestion and runny nose. Your caregiver can make recommendations and may suggest nasal or lung inhalers for other symptoms.  HOME CARE INSTRUCTIONS   Only take over-the-counter or prescription medicines for pain, discomfort, or fever as directed by your caregiver.  Use a warm mist humidifier or inhale steam from a shower to increase air moisture. This may keep secretions moist and make it easier to breathe.  Drink enough water  and fluids to keep your urine clear or pale yellow.  Rest as needed.  Return to work when your temperature has returned to normal or as your caregiver advises. You may need to stay home longer to avoid infecting others. You can also use a face mask and careful hand washing to prevent spread of the virus. SEEK MEDICAL CARE IF:   After the first few days, you feel you are getting worse rather than better.  You need your caregiver's advice about medicines to control symptoms.  You develop chills, worsening shortness of breath, or brown  or red sputum. These may be signs of pneumonia.  You develop yellow or brown nasal discharge or pain in the face, especially when you bend forward. These may be signs of sinusitis.  You develop a fever, swollen neck glands, pain with swallowing, or white areas in the back of your throat. These may be signs of strep throat. SEEK IMMEDIATE MEDICAL CARE IF:   You have a fever.  You develop severe or persistent headache, ear pain, sinus pain, or chest pain.  You develop wheezing, a prolonged cough, cough up blood, or have a change in your usual mucus (if you have chronic lung disease).  You develop sore muscles or a stiff neck. Document Released: 02/11/2001 Document Revised: 11/10/2011 Document Reviewed: 11/23/2013 [facility] Patient Information 2015 [place], [place]. This information is not intended to replace advice given to you by your health care provider. Make sure you discuss any questions you have with your health care provider.  - Take meds as prescribed - Use a cool mist humidifier  -Use saline nose sprays frequently -Saline irrigations of the nose can be very helpful if done frequently.  * 4X daily for 1 week*  * Use of a nettie pot can be helpful with this. Follow directions with this* -Force fluids -For any cough or congestion  Use plain Mucinex- regular strength or max strength is fine   * Children- consult with Pharmacist for dosing -For fever or aces or pains- take tylenol or ibuprofen appropriate for age and weight.  * for fevers greater than 101 orally you may alternate ibuprofen and tylenol every  3 hours. -Throat lozenges if help   Jannifer Rodney, FNP

## 2015-02-28 NOTE — Addendum Note (Signed)
Addended by: Orma RenderHODGES, Dontay Harm F on: 02/28/2015 04:45 PM   Modules accepted: Orders

## 2015-02-28 NOTE — Progress Notes (Signed)
Subjective:    Patient ID: [name], female    DOB: [birth year], 81 y.o.   MRN: [id number]  Cough This is a new problem. The current episode started in the past 7 days. The problem has been unchanged. The problem occurs every few minutes. The cough is productive of sputum. Associated symptoms include nasal congestion and postnasal drip. Pertinent negatives include no chills, ear congestion, ear pain, fever, headaches, myalgias, rhinorrhea, sore throat, shortness of breath or wheezing. The symptoms are aggravated by lying down. She has tried rest (Mucinex) for the symptoms. The treatment provided mild relief. There is no history of asthma or COPD.  Urinary Frequency  This is a recurrent problem. The problem occurs every urination. The problem has been unchanged. The quality of the pain is described as burning. The pain is at a severity of 9/10. There has been no fever. Associated symptoms include frequency and hesitancy. Pertinent negatives include no chills, hematuria, nausea or vomiting. She has tried increased fluids for the symptoms. The treatment provided no relief.      Review of Systems  Constitutional: Negative.  Negative for fever and chills.  HENT: Positive for postnasal drip. Negative for ear pain, rhinorrhea and sore throat.   Eyes: Negative.   Respiratory: Positive for cough. Negative for shortness of breath and wheezing.   Cardiovascular: Negative.  Negative for palpitations.  Gastrointestinal: Negative.  Negative for nausea and vomiting.  Endocrine: Negative.   Genitourinary: Positive for hesitancy and frequency. Negative for hematuria.  Musculoskeletal: Negative.  Negative for myalgias.  Neurological: Negative.  Negative for headaches.  Hematological: Negative.   Psychiatric/Behavioral: Negative.   All other systems reviewed and are negative.      Objective:   Physical Exam  Constitutional: She is oriented to person, place, and time. She appears well-developed  and well-nourished. No distress.  HENT:  Head: Normocephalic and atraumatic.  Right Ear: External ear normal.  Left Ear: External ear normal.  Nasal passage erythemas with mild swelling  Oropharynx eythemas  Eyes: Pupils are equal, round, and reactive to light.  Neck: Normal range of motion. Neck supple. No thyromegaly present.  Cardiovascular: Normal rate, regular rhythm, normal heart sounds and intact distal pulses.   No murmur heard. Pulmonary/Chest: Effort normal and breath sounds normal. No respiratory distress. She has no wheezes.  Abdominal: Soft. Bowel sounds are normal. She exhibits no distension. There is tenderness (milid lower abd tenderness).  Musculoskeletal: Normal range of motion. She exhibits no edema or tenderness.  Neurological: She is alert and oriented to person, place, and time. She has normal reflexes. No cranial nerve deficit.  Skin: Skin is warm and dry.  Psychiatric: She has a normal mood and affect. Her behavior is normal. Judgment and thought content normal.  Vitals reviewed.     BP 169/78 mmHg  Pulse 77  Temp(Src) 98.3 F (36.8 C) (Oral)  Ht  (1.626 m)  Wt 144 lb 4.8 oz (65.454 kg)  BMI 24.76 kg/m2     Assessment & Plan:  1. Frequency of urination - POCT urinalysis dipstick - POCT UA - Microscopic Only - Ambulatory referral to Urology  2. Atherosclerosis of native coronary artery of native heart without angina pectoris - Ambulatory referral to Cardiology  3. Urinary tract infection without hematuria, site unspecified -Force fluids AZO over the counter X2 days RTO prn Culture pending - Ambulatory referral to Urology - cefTRIAXone (ROCEPHIN) injection 1 g; Inject 1 g into the muscle once.  4. Acute upper respiratory  infection -- Take meds as prescribed - Use a cool mist humidifier  -Use saline nose sprays frequently -Saline irrigations of the nose can be very helpful if done frequently.  * 4X daily for 1 week*  * Use of a nettie  pot can be helpful with this. Follow directions with this* -Force fluids -For any cough or congestion  Use plain Mucinex- regular strength or max strength is fine   * Children- consult with Pharmacist for dosing -For fever or aces or pains- take tylenol or ibuprofen appropriate for age and weight.  * for fevers greater than 101 orally you may alternate ibuprofen and tylenol every  3 hours. -Throat lozenges if helps - cefTRIAXone (ROCEPHIN) injection 1 g; Inject 1 g into the muscle once.   Jannifer Rodneyhristy Rick Warnick, FNP

## 2015-03-02 LAB — URINE CULTURE

## 2015-03-07 ENCOUNTER — Other Ambulatory Visit: Payer: Self-pay | Admitting: Family Medicine

## 2015-03-09 ENCOUNTER — Other Ambulatory Visit: Payer: Self-pay | Admitting: Family

## 2015-03-09 DIAGNOSIS — Z780 Asymptomatic menopausal state: Secondary | ICD-10-CM

## 2015-03-15 DIAGNOSIS — I10 Essential (primary) hypertension: Secondary | ICD-10-CM | POA: Diagnosis not present

## 2015-03-15 DIAGNOSIS — Z79899 Other long term (current) drug therapy: Secondary | ICD-10-CM | POA: Diagnosis not present

## 2015-03-15 DIAGNOSIS — Z7902 Long term (current) use of antithrombotics/antiplatelets: Secondary | ICD-10-CM | POA: Diagnosis not present

## 2015-03-15 DIAGNOSIS — Z87440 Personal history of urinary (tract) infections: Secondary | ICD-10-CM | POA: Diagnosis not present

## 2015-03-15 DIAGNOSIS — K219 Gastro-esophageal reflux disease without esophagitis: Secondary | ICD-10-CM | POA: Diagnosis not present

## 2015-03-15 DIAGNOSIS — I519 Heart disease, unspecified: Secondary | ICD-10-CM | POA: Diagnosis not present

## 2015-03-15 DIAGNOSIS — Z8249 Family history of ischemic heart disease and other diseases of the circulatory system: Secondary | ICD-10-CM | POA: Diagnosis not present

## 2015-03-15 DIAGNOSIS — R300 Dysuria: Secondary | ICD-10-CM | POA: Diagnosis not present

## 2015-03-15 DIAGNOSIS — I252 Old myocardial infarction: Secondary | ICD-10-CM | POA: Diagnosis not present

## 2015-03-16 ENCOUNTER — Encounter: Payer: Self-pay | Admitting: *Deleted

## 2015-03-16 ENCOUNTER — Telehealth: Payer: Self-pay | Admitting: Family

## 2015-03-16 ENCOUNTER — Encounter: Payer: Self-pay | Admitting: Cardiology

## 2015-03-16 ENCOUNTER — Ambulatory Visit (INDEPENDENT_AMBULATORY_CARE_PROVIDER_SITE_OTHER): Payer: Commercial Managed Care - HMO | Admitting: Cardiology

## 2015-03-16 VITALS — BP 150/82 | HR 70 | Ht 64.0 in | Wt 146.0 lb

## 2015-03-16 DIAGNOSIS — I2510 Atherosclerotic heart disease of native coronary artery without angina pectoris: Secondary | ICD-10-CM

## 2015-03-16 DIAGNOSIS — E782 Mixed hyperlipidemia: Secondary | ICD-10-CM

## 2015-03-16 NOTE — Progress Notes (Signed)
Cardiology Office Note  Date: 03/16/2015   ID: [name], DOB [birth year], MRN [id number]  PCP: Junie SpencerHawks, [name], [name]  Primary Cardiologist: Suzanne DellSamuel Melinda Pottinger, [name]   Chief Complaint  Patient presents with  . Coronary Artery Disease    History of Present Illness: [name] is an 81 y.o. female last seen in March. She presents for [name] follow-up visit. Fortunately, remains stable from [name] cardiac perspective. She denies any angina symptoms. Complains mainly of anxiety and chronic tremor. I also reviewed her recent primary care office notes.  Cardiac medications reviewed. She reports compliance. States that her son also helps her with her medications.  Cardiac catheterization in October 2012 showed patent stents within the mid to distal LAD, moderate disease within the diagonal that has been managed medically.  Past Medical History  Diagnosis Date  . Coronary atherosclerosis of native coronary artery     DES LAD and BMS LAD 8/08  . Mixed hyperlipidemia   . GERD (gastroesophageal reflux disease)   . Essential hypertension, benign   . Anxiety   . Resting tremor   . COPD (chronic obstructive pulmonary disease)   . Rickets   . MI (myocardial infarction) 2008    Current Outpatient Prescriptions  Medication Sig Dispense Refill  . acetaminophen (TYLENOL) 500 MG tablet Take 500 mg by mouth every 6 (six) hours as needed. pain    . albuterol (PROVENTIL HFA;VENTOLIN HFA) 108 (90 BASE) MCG/ACT inhaler Inhale 2 puffs into the lungs every 6 (six) hours as needed for wheezing or shortness of breath. 1 Inhaler 0  . albuterol (PROVENTIL) (2.5 MG/3ML) 0.083% nebulizer solution Take 3 mLs (2.5 mg total) by nebulization every 6 (six) hours as needed for wheezing or shortness of breath. 150 mL 1  . atorvastatin (LIPITOR) 20 MG tablet TAKE 1 TABLET BY MOUTH DAILY AT 6 PM. 30 tablet 4  . calcium citrate-vitamin D (CITRACAL+D) 315-200 MG-UNIT per tablet Take 1 tablet by mouth daily.    .  cholecalciferol (VITAMIN D) 1000 UNITS tablet Take 4,000 Units by mouth daily.    . clopidogrel (PLAVIX) 75 MG tablet Take 1 tablet (75 mg total) by mouth daily. 30 tablet 6  . clotrimazole (LOTRIMIN) 1 % cream Apply 1 application topically 2 (two) times daily. 30 g 1  . doxycycline (VIBRA-TABS) 100 MG tablet Take 1 tablet (100 mg total) by mouth 2 (two) times daily. 20 tablet 0  . fluconazole (DIFLUCAN) 150 MG tablet Take 1 tablet (150 mg total) by mouth once. 1 tablet 0  . isosorbide mononitrate (IMDUR) 30 MG 24 hr tablet Take 1 tablet (30 mg total) by mouth 2 (two) times daily. 60 tablet 6  . lisinopril (PRINIVIL,ZESTRIL) 10 MG tablet Take 1 tablet (10 mg total) by mouth daily. 30 tablet 6  . LORazepam (ATIVAN) 1 MG tablet Take 1 tablet (1 mg total) by mouth 2 (two) times daily as needed for anxiety. 60 tablet 2  . mirabegron ER (MYRBETRIQ) 50 MG TB24 tablet Take 50 mg by mouth daily.    . nitroGLYCERIN (NITROSTAT) 0.4 MG SL tablet Place 1 tablet (0.4 mg total) under the tongue every 5 (five) minutes as needed. 25 tablet 3  . ondansetron (ZOFRAN) 8 MG tablet Take 1 tablet (8 mg total) by mouth every 4 (four) hours as needed for nausea or vomiting. 30 tablet 3  . pantoprazole (PROTONIX) 40 MG tablet TAKE 1 TABLET BY MOUTH EVERY DAY AT NOON 30 tablet 5   Current Facility-Administered Medications  Medication Dose Route Frequency Provider Last Rate Last Dose  . levalbuterol (XOPENEX) nebulizer solution 1.25 mg  1.25 mg Nebulization Once Deatra Canter, [name]        Allergies:  Azithromycin; Cephalexin; Ciprofloxacin; Codeine; Contrast media; Latex; Nitrofuran derivatives; Nsaids; Nutritional supplements; Penicillins; Phenazopyridine; Quinolones; Sulfa antibiotics; Toviaz; and Trimethoprim   Social History: The patient  reports that she has never smoked. She has never used smokeless tobacco. She reports that she does not drink alcohol or use illicit drugs.   ROS:  Please see the history of  present illness. Otherwise, complete review of systems is positive for chronic arthritic pains.  All other systems are reviewed and negative.   Physical Exam: VS:  BP 150/82 mmHg  Pulse 70  Ht  (1.626 m)  Wt 146 lb (66.225 kg)  BMI 25.05 kg/m2  SpO2 98%, BMI Body mass index is 25.05 kg/(m^2).  Wt Readings from Last 3 Encounters:  03/16/15 146 lb (66.225 kg)  02/28/15 144 lb 4.8 oz (65.454 kg)  02/13/15 143 lb 9.6 oz (65.137 kg)    Comfortable at rest. Uses [name] cane. HEENT: Conjunctiva and lids normal, oropharynx clear.  Neck: Supple, no elevated jugular venous pressure, no bruits.  Lungs: Clear to auscultation, nonlabored.  Cardiac: Regular rate and rhythm, no S3 gallop.  Abdomen: Soft, nontender, no bruits.  Skin: Warm and dry.  Extremities: No significant peripheral edema    ECG: Tracing from 12/11/2014 showed normal sinus rhythm.   Recent Labwork: 05/12/2014: TSH 2.270 12/11/2014: ALT 9; AST 16; BUN 27*; Creatinine, Ser 0.99; Hemoglobin 11.9*; Platelets 174; Potassium 4.1; Sodium 139     Component Value Date/Time   CHOL 146 11/09/2014 1523   CHOL 135 06/07/2012 0528   TRIG 88 11/09/2014 1523   HDL 72 11/09/2014 1523   HDL 63 06/07/2012 0528   CHOLHDL 2.0 11/09/2014 1523   CHOLHDL 2.1 06/07/2012 0528   VLDL 17 06/07/2012 0528   LDLCALC 56 11/09/2014 1523   LDLCALC 55 06/07/2012 0528   Assessment and Plan:  1. Symptomatically stable CAD status post previous DES and BMS to the LAD in 2008. Continue medical therapy and observation. Recent ECG reviewed.  2. Hyperlipidemia, LDL 56 on Lipitor.  Current medicines were reviewed with the patient today.  Disposition: FU with me in 6 months.   Signed, Jonelle Sidle, [name], [facility] 03/16/2015 3:07 PM    [facility] [address] [place], [place], [place] [address] Phone: [phone]; Fax: [phone]

## 2015-03-16 NOTE — Telephone Encounter (Signed)
Pt wants to keep appt

## 2015-03-16 NOTE — Patient Instructions (Signed)
Your physician recommends that you continue on your current medications as directed. Please refer to the Current Medication list given to you today. Your physician recommends that you schedule a follow-up appointment in: 6 months. You will receive a reminder letter in the mail in about 4 months reminding you to call and schedule your appointment. If you don't receive this letter, please contact our office. 

## 2015-03-21 ENCOUNTER — Ambulatory Visit (INDEPENDENT_AMBULATORY_CARE_PROVIDER_SITE_OTHER): Payer: Commercial Managed Care - HMO

## 2015-03-21 ENCOUNTER — Ambulatory Visit: Payer: Commercial Managed Care - HMO

## 2015-03-21 ENCOUNTER — Encounter: Payer: Self-pay | Admitting: Pharmacist

## 2015-03-21 ENCOUNTER — Ambulatory Visit (INDEPENDENT_AMBULATORY_CARE_PROVIDER_SITE_OTHER): Payer: Commercial Managed Care - HMO | Admitting: Pharmacist

## 2015-03-21 VITALS — BP 142/62 | HR 66 | Ht 60.0 in | Wt 146.0 lb

## 2015-03-21 DIAGNOSIS — Z0000 Encounter for general adult medical examination without abnormal findings: Secondary | ICD-10-CM

## 2015-03-21 DIAGNOSIS — Z780 Asymptomatic menopausal state: Secondary | ICD-10-CM

## 2015-03-21 DIAGNOSIS — M810 Age-related osteoporosis without current pathological fracture: Secondary | ICD-10-CM | POA: Diagnosis not present

## 2015-03-21 DIAGNOSIS — I10 Essential (primary) hypertension: Secondary | ICD-10-CM

## 2015-03-21 DIAGNOSIS — Z9181 History of falling: Secondary | ICD-10-CM | POA: Insufficient documentation

## 2015-03-21 NOTE — Progress Notes (Signed)
Patient ID: [name], female   DOB: 10/26/1930, 81 y.o.   MRN: [id number]    Subjective:   [name] is a 81 y.o. female who presents for an Initial Medicare Annual Wellness Visit and to have DEXA and then review results.  [name] last DEXA was in 2006 and showed osteopenia.  She has a pelvic fracture 08/2014 and was subsequently in nursing home at [facility] for several weeks in early 2016 to for rehab.  Evista had been prescribed in but patient cannot recall why she stopped.  She has severe GERD and esophageal strictures which prevented oral bisphosphonates in the past.    [name] is a widowed white female.  She lives with one of her sons.  He is not with her today and she is not a very good historian.   She doesn't not have any health concerns today other than wanting to know the results of the BMD checked today.  Current Medications (verified) Outpatient Encounter Prescriptions as of 03/21/2015  Medication Sig  . acetaminophen (TYLENOL) 500 MG tablet Take 500 mg by mouth every 6 (six) hours as needed. pain  . albuterol (PROVENTIL HFA;VENTOLIN HFA) 108 (90 BASE) MCG/ACT inhaler Inhale 2 puffs into the lungs every 6 (six) hours as needed for wheezing or shortness of breath.  Marland Kitchen albuterol (PROVENTIL) (2.5 MG/3ML) 0.083% nebulizer solution Take 3 mLs (2.5 mg total) by nebulization every 6 (six) hours as needed for wheezing or shortness of breath.  Marland Kitchen atorvastatin (LIPITOR) 20 MG tablet TAKE 1 TABLET BY MOUTH DAILY AT 6 PM.  . calcium citrate-vitamin D (CITRACAL+D) 315-200 MG-UNIT per tablet Take 2 tablets by mouth daily.  . cholecalciferol (VITAMIN D) 1000 UNITS tablet Take 4,000 Units by mouth daily.  . clopidogrel (PLAVIX) 75 MG tablet Take 1 tablet (75 mg total) by mouth daily.  . clotrimazole (LOTRIMIN) 1 % cream Apply 1 application topically 2 (two) times daily.  . isosorbide mononitrate (IMDUR) 30 MG 24 hr tablet Take 1 tablet (30 mg total) by mouth 2 (two) times  daily.  Marland Kitchen lisinopril (PRINIVIL,ZESTRIL) 10 MG tablet Take 1 tablet (10 mg total) by mouth daily.  Marland Kitchen LORazepam (ATIVAN) 1 MG tablet Take 1 tablet (1 mg total) by mouth 2 (two) times daily as needed for anxiety.  . mirabegron ER (MYRBETRIQ) 50 MG TB24 tablet Take 50 mg by mouth daily.  . nitroGLYCERIN (NITROSTAT) 0.4 MG SL tablet Place 1 tablet (0.4 mg total) under the tongue every 5 (five) minutes as needed.  . pantoprazole (PROTONIX) 40 MG tablet TAKE 1 TABLET BY MOUTH EVERY DAY AT NOON  . ondansetron (ZOFRAN) 8 MG tablet Take 1 tablet (8 mg total) by mouth every 4 (four) hours as needed for nausea or vomiting. (Patient not taking: Reported on 03/21/2015)  . [DISCONTINUED] doxycycline (VIBRA-TABS) 100 MG tablet Take 1 tablet (100 mg total) by mouth 2 (two) times daily.  . [DISCONTINUED] fluconazole (DIFLUCAN) 150 MG tablet Take 1 tablet (150 mg total) by mouth once. (Patient not taking: Reported on 03/21/2015)  . [DISCONTINUED] levalbuterol (XOPENEX) nebulizer solution 1.25 mg    No facility-administered encounter medications on file as of 03/21/2015.    Allergies (verified) Azithromycin; Cephalexin; Ciprofloxacin; Codeine; Contrast media; Latex; Nitrofuran derivatives; Nsaids; Nutritional supplements; Penicillins; Phenazopyridine; Quinolones; Sulfa antibiotics; Toviaz; and Trimethoprim   History: Past Medical History  Diagnosis Date  . Coronary atherosclerosis of native coronary artery     DES LAD and BMS LAD 8/08  . Mixed hyperlipidemia   .  GERD (gastroesophageal reflux disease)   . Essential hypertension, benign   . Anxiety   . Resting tremor   . COPD (chronic obstructive pulmonary disease)   . Rickets   . MI (myocardial infarction) 2008  . Pelvic fracture 08/2014  . Esophageal stricture   . Osteoporosis    Past Surgical History  Procedure Laterality Date  . Appendectomy    . Cholecystectomy    . Bilateral cataract surgery    . Vein ligation and stripping    . Cardiac  catheterization    . Coronary angioplasty with stent placement    . Left trochanteric bursa injection  11/27/11    DR. O'TOOLE  . Left sacroiliac joint injection  11/27/11    DR. O'TOOLE  . Yag laser application  01/03/6567    Procedure: YAG LASER APPLICATION;  Surgeon: Williams Che, MD;  Location: [facility];  Service: Ophthalmology;  Laterality: Left;   Family History  Problem Relation Age of Onset  . Hypertension     Social History   Occupational History  . Not on file.   Social History Main Topics  . Smoking status: Never Smoker   . Smokeless tobacco: Never Used  . Alcohol Use: No  . Drug Use: No  . Sexual Activity: No    Do you feel safe at home?  Yes  Dietary issues and exercise activities: Current Exercise Habits:: Home exercise routine;The patient does not participate in regular exercise at present  Current Dietary habits:  Reports not following any specific diet recommendations   Objective:    Today's Vitals   03/21/15 1152  BP: 142/62  Pulse: 66  Height: 5' (1.524 m)  Weight: 146 lb (66.225 kg)  PainSc: 2   PainLoc: Hip   Body mass index is 28.51 kg/(m^2).   BMD results: L1-L4 = -0.2 today (this is a 10.5% improvement compared to last DEXA which showed a T-Score of -1.1)  Total Right Femur = -2.8 today  Activities of Daily Living In your present state of health, do you have any difficulty performing the following activities: 03/21/2015  Hearing? N  Vision? N  Difficulty concentrating or making decisions? Y  Walking or climbing stairs? N  Dressing or bathing? N  Doing errands, shopping? N  Preparing Food and eating ? N  Using the Toilet? N  In the past six months, have you accidently leaked urine? Y  Do you have problems with loss of bowel control? N  Managing your Medications? N  Managing your Finances? N  Housekeeping or managing your Housekeeping? N    Are there smokers in your home (other than you)? No   Cardiac Risk Factors include:  advanced age (>16mn, >>70women);dyslipidemia;family history of premature cardiovascular disease;hypertension;sedentary lifestyle  Depression Screen PHQ 2/9 Scores 03/21/2015 12/01/2014 08/01/2014 06/19/2014  PHQ - 2 Score 0 0 0 0    Fall Risk Fall Risk  03/21/2015 12/01/2014 08/01/2014 06/19/2014 05/15/2014  Falls in the past year? Yes Yes No No Yes  Number falls in past yr: 1 1 - - 1  Injury with Fall? Yes Yes - - No  Risk Factor Category  High Fall Risk - - - -  Follow up Falls evaluation completed;Falls prevention discussed - - - -    Cognitive Function: MMSE - Mini Mental State Exam 03/21/2015  Not completed: Unable to complete;Refused  Orientation to time 0  Orientation to Place 2  Registration 3  Attention/ Calculation 0  Recall 1  Language- name 2  objects 2  Language- repeat 1  Language- follow 3 step command 3  Language- read & follow direction 0  Write a sentence 0  Copy design 0  Total score 12    Immunizations and Health Maintenance Immunization History  Administered Date(s) Administered  . Influenza Whole 06/07/1996  . Pneumococcal Conjugate-13 11/09/2014  . Pneumococcal Polysaccharide-23 09/02/1995, 09/01/2000  . Pneumococcal-Unspecified 09/01/2010  . Td 12/30/1996  . Tdap 06/01/2012  . Zoster 06/01/2012   There are no preventive care reminders to display for this patient.  Patient Care Team: Sharion Balloon, FNP as PCP - General (Nurse Practitioner) Satira Sark, MD (Cardiology)  Indicate any recent Medical Services you may have received from other than [facility] providers in the past year (date may be approximate).    Assessment:    Annual Wellness Visit  Decreased cognitive function - unable to fully assess as patient grew nervous and did not answer the last 3 questions.  Osteoporosis with pelvic fracture about 7 months ago.  HTN  Screening Tests Health Maintenance  Topic Date Due  . INFLUENZA VACCINE  04/02/2015  . DEXA SCAN  03/20/2017  .  COLONOSCOPY  07/07/2018  . TETANUS/TDAP  06/01/2022  . ZOSTAVAX  Completed  . PNA vac Low Risk Adult  Completed        Plan:   During the course of the visit Chyanna was educated and counseled about the following appropriate screening and preventive services and other recommendations as noted:  Vaccines to include Pneumoccal, Influenza, Hepatitis B, Td, Zostavax - UTD  Colorectal cancer screening - colonoscopy UTD;  Given FOBT to take home and return in office today.  Cardiovascular disease screening - recently saw Dr Domenic Polite in the last 1-2 months.  LDL at goal.  SBP was slightly above goal but better the last BP check.    Diabetes screening - last FBG was elevated - rechecking today and will also get A1c  Bone Denisty / Osteoporosis Screening - done today.  Awaiting labs to determine best possible treatment to prevent future fractures (considering Forteo, Reclast and Prolia)  Increase calcium supplement to 2 tablets daily  Increase weight bearing exercise as able   Discussed fall prevention  Mammogram - patient refused  PAP - no longer required  Nutrition counseling - discussed limiting high NaCl containing foods and increase potassium rich foods to help with BP  Advanced Directives - declined  Patient to see PCP in 1 month - reassess cognitive function.  Possible decreased per today's MMSE but we were unable to complete testing  Orders Placed This Encounter  Procedures  . BMP8+EGFR  . Magnesium  . Phosphorus  . Parathyroid hormone, intact (no Ca)    Patient Instructions (the written plan) were given to the patient.   Cherre Robins, [facility]   03/21/2015

## 2015-03-21 NOTE — Patient Instructions (Signed)
[name] , Thank you for taking time to come for your Medicare Wellness Visit. I appreciate your ongoing commitment to your health goals. Please review the following plan we discussed and let me know if I can assist you in the future.    Ok to start Floragen once a day  This is a list of the screening recommended for you and due dates:  Health Maintenance  Topic Date Due  . Flu Shot  04/02/2015  . DEXA scan (bone density measurement)  03/20/2017  . Colon Cancer Screening  07/07/2018  . Tetanus Vaccine  06/01/2022  . Shingles Vaccine  Completed  . Pneumonia vaccines  Completed   Osteoporosis  We are checking into starting Reclast which is a yearly medication for osteoporosis.  We need to check some labs before we can start.  Increase calcium supplement to 2 tablets once a day.  Osteoporosis happens when your bones become weak because of bone loss. Weak bones can break (fracture) more easily with slips or falls. You are more likely to develop osteoporosis if:  You are a woman.  You are older than 50 years.  You are white or Asian.  You are very thin.  Someone in your family has had osteoporosis.  You smoke or use nicotine. CAUSES   Smoking.  Too much drinking.  Being a weight below normal.  Not being active.  Not going outside in the sun enough.  Certain medical conditions, such as diabetes or Crohn disease.  Certain medicines, such as steroids or antiseizure medicines. TREATMENT  The goal of treatment is to strengthen bones. There are different types of medicines that help your bones. Some medicines make your bones more solid. Some medicines help to slow down how much bone you lose. Your doctor may check to see if you are getting enough calcium and vitamin D in your diet. PREVENTION   Make sure you get enough calcium and vitamin D.  Make sure you exercise often.  If you smoke, quit. MAKE SURE YOU:  Understand these instructions.  Will watch your  condition.  Will get help right away if you are not doing well or get worse. Document Released: 11/10/2011 Document Reviewed: 11/10/2011 [facility] Patient Information 2015 [place], [place]. This information is not intended to replace advice given to you by your health care provider. Make sure you discuss any questions you have with your health care provider.  Fall Prevention and Home Safety Falls cause injuries and can affect all age groups. It is possible to use preventive measures to significantly decrease the likelihood of falls. There are many simple measures which can make your home safer and prevent falls. OUTDOORS  Repair cracks and edges of walkways and driveways.  Remove high doorway thresholds.  Trim shrubbery on the main path into your home.  Have good outside lighting.  Clear walkways of tools, rocks, debris, and clutter.  Check that handrails are not broken and are securely fastened. Both sides of steps should have handrails.  Have leaves, snow, and ice cleared regularly.  Use sand or salt on walkways during winter months.  In the garage, clean up grease or oil spills. BATHROOM  Install night lights.  Install grab bars by the toilet and in the tub and shower.  Use non-skid mats or decals in the tub or shower.  Place a plastic non-slip stool in the shower to sit on, if needed.  Keep floors dry and clean up all water on the floor immediately.  Remove soap  buildup in the tub or shower on a regular basis.  Secure bath mats with non-slip, double-sided rug tape.  Remove throw rugs and tripping hazards from the floors. BEDROOMS  Install night lights.  Make sure a bedside light is easy to reach.  Do not use oversized bedding.  Keep a telephone by your bedside.  Have a firm chair with side arms to use for getting dressed.  Remove throw rugs and tripping hazards from the floor. KITCHEN  Keep handles on pots and pans turned toward the center of the stove.  Use back burners when possible.  Clean up spills quickly and allow time for drying.  Avoid walking on wet floors.  Avoid hot utensils and knives.  Position shelves so they are not too high or low.  Place commonly used objects within easy reach.  If necessary, use a sturdy step stool with a grab bar when reaching.  Keep electrical cables out of the way.  Do not use floor polish or wax that makes floors slippery. If you must use wax, use non-skid floor wax.  Remove throw rugs and tripping hazards from the floor. STAIRWAYS  Never leave objects on stairs.  Place handrails on both sides of stairways and use them. Fix any loose handrails. Make sure handrails on both sides of the stairways are as long as the stairs.  Check carpeting to make sure it is firmly attached along stairs. Make repairs to worn or loose carpet promptly.  Avoid placing throw rugs at the top or bottom of stairways, or properly secure the rug with carpet tape to prevent slippage. Get rid of throw rugs, if possible.  Have an electrician put in a light switch at the top and bottom of the stairs. OTHER FALL PREVENTION TIPS  Wear low-heel or rubber-soled shoes that are supportive and fit well. Wear closed toe shoes.  When using a stepladder, make sure it is fully opened and both spreaders are firmly locked. Do not climb a closed stepladder.  Add color or contrast paint or tape to grab bars and handrails in your home. Place contrasting color strips on first and last steps.  Learn and use mobility aids as needed. Install an electrical emergency response system.  Turn on lights to avoid dark areas. Replace light bulbs that burn out immediately. Get light switches that glow.  Arrange furniture to create clear pathways. Keep furniture in the same place.  Firmly attach carpet with non-skid or double-sided tape.  Eliminate uneven floor surfaces.  Select a carpet pattern that does not visually hide the edge of  steps.  Be aware of all pets. OTHER HOME SAFETY TIPS  Set the water temperature for 120 F (48.8 C).  Keep emergency numbers on or near the telephone.  Keep smoke detectors on every level of the home and near sleeping areas. Document Released: 08/08/2002 Document Revised: 02/17/2012 Document Reviewed: 11/07/2011 [facility] Patient Information 2015 [place], [place]. This information is not intended to replace advice given to you by your health care provider. Make sure you discuss any questions you have with your health care provider.

## 2015-03-22 LAB — BMP8+EGFR
BUN/Creatinine Ratio: 21 (ref 11–26)
BUN: 19 mg/dL (ref 8–27)
CALCIUM: 9.1 mg/dL (ref 8.7–10.3)
CO2: 23 mmol/L (ref 18–29)
Chloride: 101 mmol/L (ref 97–108)
Creatinine, Ser: 0.9 mg/dL (ref 0.57–1.00)
GFR calc Af Amer: 69 mL/min/{1.73_m2} (ref >59)
GFR calc non Af Amer: 60 mL/min/{1.73_m2} (ref >59)
GLUCOSE: 89 mg/dL (ref 65–99)
Potassium: 4.3 mmol/L (ref 3.5–5.2)
Sodium: 141 mmol/L (ref 134–144)

## 2015-03-22 LAB — PHOSPHORUS: Phosphorus: 3.6 mg/dL (ref 2.5–4.5)

## 2015-03-22 LAB — MAGNESIUM: Magnesium: 2.4 mg/dL — ABNORMAL HIGH (ref 1.6–2.3)

## 2015-03-22 LAB — PARATHYROID HORMONE, INTACT (NO CA): PTH: 46 pg/mL (ref 15–65)

## 2015-03-23 ENCOUNTER — Telehealth: Payer: Self-pay | Admitting: Pharmacist

## 2015-03-23 DIAGNOSIS — S329XXD Fracture of unspecified parts of lumbosacral spine and pelvis, subsequent encounter for fracture with routine healing: Secondary | ICD-10-CM

## 2015-03-23 DIAGNOSIS — S329XXA Fracture of unspecified parts of lumbosacral spine and pelvis, initial encounter for closed fracture: Secondary | ICD-10-CM | POA: Insufficient documentation

## 2015-03-23 NOTE — Telephone Encounter (Signed)
Discussed recent lab results with patient.  We are considering Reclast injections but I would like to discuss with her son as he will be the one to take her to appts.  Patient and son on board with Reclast.

## 2015-04-02 ENCOUNTER — Telehealth: Payer: Self-pay | Admitting: Pharmacist

## 2015-04-02 NOTE — Telephone Encounter (Signed)
Sent referral for Reclast infusion 03/26/15 but notice that appt has not been scheduled yet.  Called [facility] specialty clinic and they had tried to contact patient but was unable to get her.  I called [name] and she states that she might not have realized who was trying to contact her.   She requested that I call back and speak to her son (he is at work currently) and can give him the number to schedule appt.

## 2015-04-02 NOTE — Telephone Encounter (Signed)
Spoke with patient's son.  PHone number for [facility] specialty clinic was given so he can make appt for Reclast infusion.   Also advised him to have his mother drink a glass of water and take a APAP prior to appt and after appt to decrease chance of flu like sx's / aches and pain.

## 2015-04-06 ENCOUNTER — Encounter (HOSPITAL_COMMUNITY): Payer: Self-pay

## 2015-04-06 ENCOUNTER — Encounter (HOSPITAL_COMMUNITY)
Admission: RE | Admit: 2015-04-06 | Discharge: 2015-04-06 | Disposition: A | Discharge status: Home / Self Care | Payer: Commercial Managed Care - HMO | Source: Ambulatory Visit | Attending: Family | Admitting: Family

## 2015-04-06 DIAGNOSIS — K222 Esophageal obstruction: Secondary | ICD-10-CM | POA: Insufficient documentation

## 2015-04-06 DIAGNOSIS — M810 Age-related osteoporosis without current pathological fracture: Secondary | ICD-10-CM | POA: Insufficient documentation

## 2015-04-06 MED ORDER — ZOLEDRONIC ACID 5 MG/100ML IV SOLN
5.0000 mg | Freq: Once | INTRAVENOUS | Status: AC
  Administered 2015-04-06: 5 mg via INTRAVENOUS

## 2015-04-06 MED ORDER — ZOLEDRONIC ACID 5 MG/100ML IV SOLN
INTRAVENOUS | Status: AC
  Filled 2015-04-06: qty 100

## 2015-04-06 MED ORDER — SODIUM CHLORIDE 0.9 % IV SOLN
Freq: Once | INTRAVENOUS | Status: AC
  Administered 2015-04-06: 13:00:00 via INTRAVENOUS

## 2015-04-27 ENCOUNTER — Ambulatory Visit (INDEPENDENT_AMBULATORY_CARE_PROVIDER_SITE_OTHER): Payer: Commercial Managed Care - HMO | Admitting: Urology

## 2015-04-27 DIAGNOSIS — N3946 Mixed incontinence: Secondary | ICD-10-CM | POA: Diagnosis not present

## 2015-04-27 DIAGNOSIS — N952 Postmenopausal atrophic vaginitis: Secondary | ICD-10-CM | POA: Diagnosis not present

## 2015-04-27 DIAGNOSIS — R300 Dysuria: Secondary | ICD-10-CM

## 2015-04-27 DIAGNOSIS — R312 Other microscopic hematuria: Secondary | ICD-10-CM | POA: Diagnosis not present

## 2015-04-27 DIAGNOSIS — N3642 Intrinsic sphincter deficiency (ISD): Secondary | ICD-10-CM

## 2015-04-30 ENCOUNTER — Telehealth: Payer: Self-pay | Admitting: Family

## 2015-04-30 NOTE — Telephone Encounter (Signed)
Patient saw Dr Annabell Howells on 04/27/15. Her son took her to the appointment and asked them to call him before discharging her so that he would understand what is going on.  When he came back to get her she was waiting in the parking lot and no one had called him. She wasn't able to fully explain the visit but did explain that she left a urine specimen and that they would call with the results.  She continues to have urinary frequency and discomfort. He doesn't know what else to do and wanted to follow up with Jannifer Rodney, FNP. She has also had multiple ER visits for this in the past. Appt scheduled for tomorrow morning. Son will come with her to the visit.

## 2015-05-01 ENCOUNTER — Ambulatory Visit (INDEPENDENT_AMBULATORY_CARE_PROVIDER_SITE_OTHER): Payer: Commercial Managed Care - HMO | Admitting: Family

## 2015-05-01 ENCOUNTER — Encounter: Payer: Self-pay | Admitting: Family

## 2015-05-01 VITALS — BP 134/63 | HR 66 | Temp 97.8°F | Ht 60.0 in | Wt 147.0 lb

## 2015-05-01 DIAGNOSIS — N3281 Overactive bladder: Secondary | ICD-10-CM

## 2015-05-01 DIAGNOSIS — R350 Frequency of micturition: Secondary | ICD-10-CM | POA: Insufficient documentation

## 2015-05-01 DIAGNOSIS — N3001 Acute cystitis with hematuria: Secondary | ICD-10-CM | POA: Diagnosis not present

## 2015-05-01 DIAGNOSIS — N3946 Mixed incontinence: Secondary | ICD-10-CM

## 2015-05-01 DIAGNOSIS — R300 Dysuria: Secondary | ICD-10-CM

## 2015-05-01 LAB — POCT URINALYSIS DIPSTICK
Bilirubin, UA: NEGATIVE
Glucose, UA: NEGATIVE
Ketones, UA: NEGATIVE
Nitrite, UA: NEGATIVE
Protein, UA: NEGATIVE
Spec Grav, UA: 1.015
Urobilinogen, UA: NEGATIVE
pH, UA: 5

## 2015-05-01 LAB — POCT UA - MICROSCOPIC ONLY
BACTERIA, U MICROSCOPIC: NEGATIVE
Casts, Ur, LPF, POC: NEGATIVE
Crystals, Ur, HPF, POC: NEGATIVE
Mucus, UA: NEGATIVE
Yeast, UA: NEGATIVE

## 2015-05-01 NOTE — Patient Instructions (Signed)
Urinary Tract Infection A urinary tract infection (UTI) can occur any place along the urinary tract. The tract includes the kidneys, ureters, bladder, and urethra. A type of germ called bacteria often causes a UTI. UTIs are often helped with antibiotic medicine.  HOME CARE   If given, take antibiotics as told by your doctor. Finish them even if you start to feel better.  Drink enough fluids to keep your pee (urine) clear or pale yellow.  Avoid tea, drinks with caffeine, and bubbly (carbonated) drinks.  Pee often. Avoid holding your pee in for a long time.  Pee before and after having sex (intercourse).  Wipe from front to back after you poop (bowel movement) if you are a woman. Use each tissue only once. GET HELP RIGHT AWAY IF:   You have back pain.  You have lower belly (abdominal) pain.  You have chills.  You feel sick to your stomach (nauseous).  You throw up (vomit).  Your burning or discomfort with peeing does not go away.  You have a fever.  Your symptoms are not better in 3 days. MAKE SURE YOU:   Understand these instructions.  Will watch your condition.  Will get help right away if you are not doing well or get worse. Document Released: 02/04/2008 Document Revised: 05/12/2012 Document Reviewed: 03/18/2012 [facility] Patient Information 2015 [place], [place]. This information is not intended to replace advice given to you by your health care provider. Make sure you discuss any questions you have with your health care provider. Overactive Bladder The bladder has two functions that are totally opposite of the other. One is to relax and stretch out so it can store urine (fills like a balloon), and the other is to contract and squeeze down so that it can empty the urine that it has stored. Proper functioning of the bladder is a complex mixing of these two functions. The filling and emptying of the bladder can be influenced by:  The bladder.  The spinal cord.  The  brain.  The nerves going to the bladder.  Other organs that are closely related to the bladder such as prostate in males and the vagina in females. As your bladder fills with urine, nerve signals are sent from the bladder to the brain to tell you that you may need to urinate. Normal urination requires that the bladder squeeze down with sufficient strength to empty the bladder, but this also requires that the bladder squeeze down sufficiently long to finish the job. In addition the sphincter muscles, which normally keep you from leaking urine, must also relax so that the urine can pass. Coordination between the bladder muscle squeezing down and the sphincter muscles relaxing is required to make everything happen normally. With an overactive bladder sometimes the muscles of the bladder contract unexpectedly and involuntarily and this causes an urgent need to urinate. The normal response is to try to hold urine in by contracting the sphincter muscles. Sometimes the bladder contracts so strongly that the sphincter muscles cannot stop the urine from passing out and incontinence occurs. This kind of incontinence is called urge incontinence. Having an overactive bladder can be embarrassing and awkward. It can keep you from living life the way you want to. Many people think it is just something you have to put up with as you grow older or have certain health conditions. In fact, there are treatments that can help make your life easier and more pleasant. CAUSES  Many things can cause an overactive bladder.  Possibilities include:  Urinary tract infection or infection of nearby tissues such as the prostate.  Prostate enlargement.  In women, multiple pregnancies or surgery on the uterus or urethra.  Bladder stones, inflammation, or tumors.  Caffeine.  Alcohol.  Medications. For example, diuretics (drugs that help the body get rid of extra fluid) increase urine production. Some other medicines must be  taken with lots of fluids.  Muscle or nerve weakness. This might be the result of a spinal cord injury, a stroke, multiple sclerosis, or Parkinson disease.  Diabetes can cause a high urine volume which fills the bladder so quickly that the normal urge to urinate is triggered very strongly. SYMPTOMS   Loss of bladder control. You feel the need to urinate and cannot make your body wait.  Sudden, strong urges to urinate.  Urinating 8 or more times a day.  Waking up to urinate two or more times a night. DIAGNOSIS  To decide if you have overactive bladder, your health care provider will probably:  Ask about symptoms you have noticed.  Ask about your overall health. This will include questions about any medications you are taking.  Do a physical examination. This will help determine if there are obvious blockages or other problems.  Order some tests. These might include:  A blood test to check for diabetes or other health issues that could be contributing to the problem.  Urine testing. This could measure the flow of urine and the pressure on the bladder.  A test of your neurological system (the brain, spinal cord, and nerves). This is the system that senses the need to urinate. Some of these tests are called flow tests, bladder pressure tests, and electrical measurements of the sphincter muscle.  A bladder test to check whether it is emptying completely when you urinate.  Cystoscopy. This test uses a thin tube with a tiny camera on it. It offers a look inside your urethra and bladder to see if there are problems.  Imaging tests. You might be given a contrast dye and then asked to urinate. X-rays are taken to see how your bladder is working. TREATMENT  An overactive bladder can be treated in many ways. The treatment will depend on the cause. Whether you have a mild or severe case also makes a difference. Often, treatment can be given in your health care provider's office or clinic. Be  sure to discuss the different options with your caregiver. They include:  Behavioral treatments. These do not involve medication or surgery:  Bladder training. For this, you would follow a schedule to urinate at regular intervals. This helps you learn to control the urge to urinate. At first, you might be asked to wait a few minutes after feeling the urge. In time, you should be able to schedule bathroom visits an hour or more apart.  Kegel exercises. These exercises strengthen the pelvic floor muscles, which support the bladder. Toning these muscles can help control urination even if the bladder muscles are overactive. A specialist will teach you how to do these exercises correctly. They will require daily practice.  Weight loss. If you are obese or overweight, losing weight might stop your bladder from being overactive. Talk to your health care provider about how many pounds you should lose. Also ask if there is a specific program or method that would work best for you.  Diet change. This might be suggested if constipation is making your overactive bladder worse. Your health care provider or a nutritionist can  explain ways to change what you eat to ease constipation. Other people might need to take in less caffeine or alcohol. Sometimes drinking fewer fluids is needed, too.  Protection. This is not an actual treatment. But, you could wear special pads to take care of any leakage while you wait for other treatments to take effect. This will help you avoid embarrassment.  Physical treatments.  Electrical stimulation. Electrodes will send gentle pulses to the nerves or muscles that help control the bladder. The goal is to strengthen them. Sometimes this is done with the electrodes outside the body. Or, they might be placed inside the body (implanted). This treatment can take several months to have an effect.  Medications. These are usually used along with other treatments. Several medicines are  available. Some are injected into the muscles involved in urination. Others come in pill form. Medications sometimes prescribed include:  Anticholinergics. These drugs block the signals that the nerves deliver to the bladder. This keeps it from releasing urine at the wrong time. Researchers think the drugs might help in other ways, too.  Imipramine. This is an antidepressant. But, it relaxes bladder muscles.  Botox. This is still experimental. Some people believe that injecting it into the bladder muscles will relax them so they work more normally. It has also been injected into the sphincter muscle when the sphincter muscle does not open properly. This is a temporary fix, however. Also, it might make matters worse, especially in older people.  Surgery.  A device might be implanted to help manage your nerves. It works on the nerves that signal when you need to urinate.  Surgery is sometimes needed with electrical stimulation. If the electrodes are implanted, this is done through surgery.  Sometimes repairs need to be made through surgery. For example, the size of the bladder can be changed. This is usually done in severe cases only. HOME CARE INSTRUCTIONS   Take any medications your health care provider prescribed or suggested. Follow the directions carefully.  Practice any lifestyle changes that are recommended. These might include:  Drinking less fluid or drinking at different times of the day. If you need to urinate often during the night, for example, you may need to stop drinking fluids early in the evening.  Cutting down on caffeine or alcohol. They can both make an overactive bladder worse. Caffeine is found in coffee, tea, and sodas.  Doing Kegel exercises to strengthen muscles.  Losing weight, if that is recommended.  Eating a healthy and balanced diet. This will help you avoid constipation.  Keep a journal or a log. You might be asked to record how much you drink and when,  and also when you feel the need to urinate.  Learn how to care for implants or other devices, such as pessaries. SEEK MEDICAL CARE IF:   Your overactive bladder gets worse.  You feel increased pain or irritation when you urinate.  You notice blood in your urine.  You have questions about any medications or devices that your health care provider recommended.  You notice blood, pus, or swelling at the site of any test or treatment procedure.  You have an oral temperature above 102F (38.9C). SEEK IMMEDIATE MEDICAL CARE IF:  You have an oral temperature above 102F (38.9C), not controlled by medicine. Document Released: 06/14/2009 Document Revised: 01/02/2014 Document Reviewed: 06/14/2009 [facility] Patient Information 2015 [place], [place]. This information is not intended to replace advice given to you by your health care provider. Make sure you  discuss any questions you have with your health care provider.  

## 2015-05-01 NOTE — Progress Notes (Signed)
   Subjective:    Patient ID: [name], female    DOB: [birth year], 81 y.o.   MRN: [id number]  Pt presents to the office today for chronic UTI,  urinary frequency, and OAB. Pt states she had an appt with Dr. Wilson Singer on 04/27/15. Son is present with mother and states he dropped her off for the appt and states he is not sure what was said, but that a prescription was sent to the pharmacy. Pt and son states they did not start the medication until they saw me to make "sure it was ok'. Pt and son educated on the importance of following the urologists instructions. Pt and son unsure what prescription was sent. Called pharmacy and it is keflex 500 mg TID.  Urinary Frequency  This is a recurrent problem. The current episode started more than 1 year ago. The problem occurs every urination. The problem has been waxing and waning. The quality of the pain is described as aching. The pain is at a severity of 2/10. The pain is mild. Associated symptoms include frequency and hematuria. Pertinent negatives include no nausea or vomiting. She has tried increased fluids for the symptoms. The treatment provided mild relief.      Review of Systems  Constitutional: Negative.   HENT: Negative.   Eyes: Negative.   Respiratory: Negative.  Negative for shortness of breath.   Cardiovascular: Negative.  Negative for palpitations.  Gastrointestinal: Negative.  Negative for nausea and vomiting.  Endocrine: Negative.   Genitourinary: Positive for frequency and hematuria.  Musculoskeletal: Negative.   Neurological: Negative.  Negative for headaches.  Hematological: Negative.   Psychiatric/Behavioral: Negative.   All other systems reviewed and are negative.      Objective:   Physical Exam  Constitutional: She is oriented to person, place, and time. She appears well-developed and well-nourished. No distress.  Eyes: Pupils are equal, round, and reactive to light.  Neck: Normal range of motion. Neck supple. No  thyromegaly present.  Cardiovascular: Normal rate, regular rhythm, normal heart sounds and intact distal pulses.   No murmur heard. Pulmonary/Chest: Effort normal and breath sounds normal. No respiratory distress. She has no wheezes.  Abdominal: Soft. Bowel sounds are normal. She exhibits no distension. There is no tenderness.  Musculoskeletal: Normal range of motion. She exhibits no edema or tenderness.  Negative for CVA tenderness  Neurological: She is alert and oriented to person, place, and time. She has normal reflexes. No cranial nerve deficit.  Skin: Skin is warm and dry.  Psychiatric: She has a normal mood and affect. Her behavior is normal. Judgment and thought content normal.  Vitals reviewed.   BP 134/63 mmHg  Pulse 66  Temp(Src) 97.8 F (36.6 C) (Oral)  Ht 5' (1.524 m)  Wt 147 lb (66.679 kg)  BMI 28.71 kg/m2       Assessment & Plan:  1. Dysuria - POCT urinalysis dipstick - POCT UA - Microscopic Only - Urine culture  2. Mixed incontinence  3. Urination frequency  4. OAB (overactive bladder)  5. Acute cystitis with hematuria  -20 mins or greater spent with patient and son on educating the importance of following up with urologists and to take medications as prescribed -Pt does have a UTI and encouraged pt to pick up rx that Urologists sent (Kelfex 500 mg TID) -OAB discussed and handout given- Continue myrbetriq daily -Force fluids -Urine culture pending -RTO prn  Jannifer Rodney, FNP

## 2015-05-03 ENCOUNTER — Other Ambulatory Visit: Payer: Self-pay | Admitting: Family

## 2015-05-03 LAB — URINE CULTURE

## 2015-05-14 ENCOUNTER — Ambulatory Visit: Payer: Commercial Managed Care - HMO | Admitting: Family

## 2015-05-15 ENCOUNTER — Encounter: Payer: Self-pay | Admitting: Family

## 2015-05-17 ENCOUNTER — Encounter: Payer: Self-pay | Admitting: Family

## 2015-05-17 ENCOUNTER — Ambulatory Visit (INDEPENDENT_AMBULATORY_CARE_PROVIDER_SITE_OTHER): Payer: Commercial Managed Care - HMO | Admitting: Family

## 2015-05-17 VITALS — BP 162/70 | HR 59 | Temp 97.8°F | Ht 60.0 in | Wt 149.0 lb

## 2015-05-17 DIAGNOSIS — B372 Candidiasis of skin and nail: Secondary | ICD-10-CM

## 2015-05-17 MED ORDER — NYSTATIN 100000 UNIT/GM EX OINT: 1.0000 "application " | TOPICAL_OINTMENT | Freq: Two times a day (BID) | CUTANEOUS | Status: DC

## 2015-05-17 MED ORDER — NYSTATIN 100000 UNIT/GM EX POWD: CUTANEOUS | Status: DC

## 2015-05-17 NOTE — Progress Notes (Signed)
   Subjective:    Patient ID: [name], female    DOB: 09-12-1930, 81 y.o.   MRN: [id number]  Rash This is a new problem. The current episode started in the past 7 days. The problem has been waxing and waning since onset. The affected locations include the groin. The rash is characterized by redness and burning. She was exposed to nothing. Pertinent negatives include no cough, diarrhea, eye pain, fever, joint pain, shortness of breath or sore throat. Past treatments include nothing. The treatment provided no relief.      Review of Systems  Constitutional: Negative.  Negative for fever.  HENT: Negative.  Negative for sore throat.   Eyes: Negative.  Negative for pain.  Respiratory: Negative.  Negative for cough and shortness of breath.   Cardiovascular: Negative.  Negative for palpitations.  Gastrointestinal: Negative.  Negative for diarrhea.  Endocrine: Negative.   Genitourinary: Negative.   Musculoskeletal: Negative.  Negative for joint pain.  Skin: Positive for rash.  Neurological: Negative.  Negative for headaches.  Hematological: Negative.   Psychiatric/Behavioral: Negative.   All other systems reviewed and are negative.      Objective:   Physical Exam  Constitutional: She is oriented to person, place, and time. She appears well-developed and well-nourished. No distress.  HENT:  Head: Normocephalic and atraumatic.  Eyes: Pupils are equal, round, and reactive to light.  Neck: Normal range of motion. Neck supple. No thyromegaly present.  Cardiovascular: Normal rate, regular rhythm, normal heart sounds and intact distal pulses.   No murmur heard. Pulmonary/Chest: Effort normal and breath sounds normal. No respiratory distress. She has no wheezes.  Abdominal: Soft. Bowel sounds are normal. She exhibits no distension. There is no tenderness.  Musculoskeletal: Normal range of motion. She exhibits no edema or tenderness.  Neurological: She is alert and oriented to person,  place, and time. She has normal reflexes. No cranial nerve deficit.  Skin: Skin is warm and dry. Rash noted.  Erythemas rash in bilateral groin  Psychiatric: She has a normal mood and affect. Her behavior is normal. Judgment and thought content normal.  Vitals reviewed.     BP 162/70 mmHg  Pulse 59  Temp(Src) 97.8 F (36.6 C) (Oral)  Ht 5' (1.524 m)  Wt 149 lb (67.586 kg)  BMI 29.10 kg/m2     Assessment & Plan:  1. Skin yeast infection -Keep area clean and dry -Do not scratch or pick at -RTO prn -Keep Urologists appts- For Chronic UTI's  - nystatin (MYCOSTATIN/NYSTOP) 100000 UNIT/GM POWD; Apply to groin BIB  Dispense: 60 g; Refill: 2 - nystatin ointment (MYCOSTATIN); Apply 1 application topically 2 (two) times daily.  Dispense: 30 g; Refill: 0  Jannifer Rodney, FNP

## 2015-05-17 NOTE — Patient Instructions (Signed)
Yeast Infection of the Skin Some yeast on the skin is normal, but sometimes it causes an infection. If you have a yeast infection, it shows up as white or light brown patches on brown skin. You can see it better in the summer on tan skin. It causes light-colored holes in your suntan. It can happen on any area of the body. This cannot be passed from person to person. HOME CARE  Scrub your skin daily with a dandruff shampoo. Your rash may take a couple weeks to get well.  Do not scratch or itch the rash. GET HELP RIGHT AWAY IF:   You get another infection from scratching. The skin may get warm, red, and may ooze fluid.  The infection does not seem to be getting better. MAKE SURE YOU:  Understand these instructions.  Will watch your condition.  Will get help right away if you are not doing well or get worse. Document Released: 07/31/2008 Document Revised: 11/10/2011 Document Reviewed: 07/31/2008 [employer] Patient Information 2015 [employer]. This information is not intended to replace advice given to you by your health care provider. Make sure you discuss any questions you have with your health care provider.  

## 2015-05-30 ENCOUNTER — Ambulatory Visit (INDEPENDENT_AMBULATORY_CARE_PROVIDER_SITE_OTHER): Payer: Commercial Managed Care - HMO | Admitting: Urology

## 2015-05-30 DIAGNOSIS — R300 Dysuria: Secondary | ICD-10-CM | POA: Diagnosis not present

## 2015-05-30 DIAGNOSIS — N390 Urinary tract infection, site not specified: Secondary | ICD-10-CM | POA: Diagnosis not present

## 2015-05-30 DIAGNOSIS — B379 Candidiasis, unspecified: Secondary | ICD-10-CM | POA: Diagnosis not present

## 2015-05-30 DIAGNOSIS — N3281 Overactive bladder: Secondary | ICD-10-CM

## 2015-06-05 ENCOUNTER — Other Ambulatory Visit: Payer: Self-pay | Admitting: Family

## 2015-06-13 ENCOUNTER — Other Ambulatory Visit: Payer: Self-pay | Admitting: Family

## 2015-06-13 NOTE — Telephone Encounter (Signed)
Last seen 05/17/15  [name] last lipid 11/09/14

## 2015-06-14 ENCOUNTER — Encounter: Payer: Self-pay | Admitting: Family

## 2015-06-14 ENCOUNTER — Ambulatory Visit (INDEPENDENT_AMBULATORY_CARE_PROVIDER_SITE_OTHER): Payer: Commercial Managed Care - HMO | Admitting: Family

## 2015-06-14 VITALS — BP 153/71 | HR 67 | Temp 97.5°F | Ht 60.0 in | Wt 151.0 lb

## 2015-06-14 DIAGNOSIS — J309 Allergic rhinitis, unspecified: Secondary | ICD-10-CM

## 2015-06-14 DIAGNOSIS — R350 Frequency of micturition: Secondary | ICD-10-CM | POA: Diagnosis not present

## 2015-06-14 DIAGNOSIS — N3000 Acute cystitis without hematuria: Secondary | ICD-10-CM | POA: Diagnosis not present

## 2015-06-14 LAB — POCT URINALYSIS DIPSTICK
Bilirubin, UA: NEGATIVE
Glucose, UA: NEGATIVE
Ketones, UA: NEGATIVE
NITRITE UA: NEGATIVE
PH UA: 5
Spec Grav, UA: >=1.03
Urobilinogen, UA: NEGATIVE

## 2015-06-14 LAB — POCT UA - MICROSCOPIC ONLY
CASTS, UR, LPF, POC: NEGATIVE
CRYSTALS, UR, HPF, POC: NEGATIVE
MUCUS UA: NEGATIVE
YEAST UA: NEGATIVE

## 2015-06-14 MED ORDER — FLUTICASONE PROPIONATE 50 MCG/ACT NA SUSP: 2.0000 | Freq: Every day | NASAL | Status: DC

## 2015-06-14 MED ORDER — BENZONATATE 200 MG PO CAPS: 200.0000 mg | ORAL_CAPSULE | Freq: Three times a day (TID) | ORAL | Status: DC | PRN

## 2015-06-14 MED ORDER — DOXYCYCLINE HYCLATE 100 MG PO TABS: 100.0000 mg | ORAL_TABLET | Freq: Two times a day (BID) | ORAL | Status: DC

## 2015-06-14 NOTE — Progress Notes (Signed)
   Subjective:    Patient ID: [name], female    DOB: March 07, 1931, 81 y.o.   MRN: [id number]  HPI Pt presents to the office today for cough and frequent urinating . Pt's son states the urologists placed her on "small pink infection pill" everyday. Pt states she took it for 3 weeks, but it was making her "stomach hurt" so she stopped taking it. Pt states she started coughing at night. Pt states this started on Tuesday and is coughing up yellow/white sputum. Pt denies any chill, ears pain, rhinitis, or fever.      Review of Systems  Constitutional: Negative.   HENT: Negative.   Eyes: Negative.   Respiratory: Negative.  Negative for shortness of breath.   Cardiovascular: Negative.  Negative for palpitations.  Gastrointestinal: Negative.   Endocrine: Negative.   Genitourinary: Negative.   Musculoskeletal: Negative.   Neurological: Negative.  Negative for headaches.  Hematological: Negative.   Psychiatric/Behavioral: Negative.   All other systems reviewed and are negative.      Objective:   Physical Exam  Constitutional: She is oriented to person, place, and time. She appears well-developed and well-nourished. No distress.  HENT:  Head: Normocephalic and atraumatic.  Right Ear: External ear normal.  Left Ear: External ear normal.  Mouth/Throat: Oropharynx is clear and moist.  Nasal passage erythemas with mild swelling      Eyes: Pupils are equal, round, and reactive to light.  Neck: Normal range of motion. Neck supple. No thyromegaly present.  Cardiovascular: Normal rate, regular rhythm, normal heart sounds and intact distal pulses.   No murmur heard. Pulmonary/Chest: Effort normal and breath sounds normal. No respiratory distress. She has no wheezes.  Abdominal: Soft. Bowel sounds are normal. She exhibits no distension. There is tenderness (mild lower abd tenderness).  Musculoskeletal: Normal range of motion. She exhibits no edema or tenderness.  Neurological: She is  alert and oriented to person, place, and time. She has normal reflexes. No cranial nerve deficit.  Skin: Skin is warm and dry.  Psychiatric: She has a normal mood and affect. Her behavior is normal. Judgment and thought content normal.  Vitals reviewed.   BP 153/71 mmHg  Pulse 67  Temp(Src) 97.5 F (36.4 C) (Oral)  Ht 5' (1.524 m)  Wt 151 lb (68.493 kg)  BMI 29.49 kg/m2       Assessment & Plan:  1. Urine frequency - POCT urinalysis dipstick - POCT UA - Microscopic Only  2. Acute cystitis without hematuria -Force fluids AZO over the counter X2 days RTO prn Culture pending -Pt needs to follow up with Urologists  - doxycycline (VIBRA-TABS) 100 MG tablet; Take 1 tablet (100 mg total) by mouth 2 (two) times daily.  Dispense: 20 tablet; Refill: 0  3. Allergic rhinitis, unspecified allergic rhinitis type - fluticasone (FLONASE) 50 MCG/ACT nasal spray; Place 2 sprays into both nostrils daily.  Dispense: 16 g; Refill: 6 - benzonatate (TESSALON) 200 MG capsule; Take 1 capsule (200 mg total) by mouth 3 (three) times daily as needed.  Dispense: 30 capsule; Refill: 1  Jannifer Rodneyhristy Hawks, FNP

## 2015-06-14 NOTE — Patient Instructions (Signed)
Allergic Rhinitis Allergic rhinitis is when the mucous membranes in the nose respond to allergens. Allergens are particles in the air that cause your body to have an allergic reaction. This causes you to release allergic antibodies. Through a chain of events, these eventually cause you to release histamine into the blood stream. Although meant to protect the body, it is this release of histamine that causes your discomfort, such as frequent sneezing, congestion, and an itchy, runny nose.  CAUSES Seasonal allergic rhinitis (hay fever) is caused by pollen allergens that may come from grasses, trees, and weeds. Year-round allergic rhinitis (perennial allergic rhinitis) is caused by allergens such as house dust mites, pet dander, and mold spores. SYMPTOMS  Nasal stuffiness (congestion).  Itchy, runny nose with sneezing and tearing of the eyes. DIAGNOSIS Your health care provider can help you determine the allergen or allergens that trigger your symptoms. If you and your health care provider are unable to determine the allergen, skin or blood testing may be used. Your health care provider will diagnose your condition after taking your health history and performing a physical exam. Your health care provider may assess you for other related conditions, such as asthma, pink eye, or an ear infection. TREATMENT Allergic rhinitis does not have a cure, but it can be controlled by:  Medicines that block allergy symptoms. These may include allergy shots, nasal sprays, and oral antihistamines.  Avoiding the allergen. Hay fever may often be treated with antihistamines in pill or nasal spray forms. Antihistamines block the effects of histamine. There are over-the-counter medicines that may help with nasal congestion and swelling around the eyes. Check with your health care provider before taking or giving this medicine. If avoiding the allergen or the medicine prescribed do not work, there are many new medicines  your health care provider can prescribe. Stronger medicine may be used if initial measures are ineffective. Desensitizing injections can be used if medicine and avoidance does not work. Desensitization is when a patient is given ongoing shots until the body becomes less sensitive to the allergen. Make sure you follow up with your health care provider if problems continue. HOME CARE INSTRUCTIONS It is not possible to completely avoid allergens, but you can reduce your symptoms by taking steps to limit your exposure to them. It helps to know exactly what you are allergic to so that you can avoid your specific triggers. SEEK MEDICAL CARE IF:  You have a fever.  You develop a cough that does not stop easily (persistent).  You have shortness of breath.  You start wheezing.  Symptoms interfere with normal daily activities.   This information is not intended to replace advice given to you by your health care provider. Make sure you discuss any questions you have with your health care provider.   Document Released: 05/13/2001 Document Revised: 09/08/2014 Document Reviewed: 04/25/2013 Elsevier Interactive Patient Education 2016 Elsevier Inc.  

## 2015-06-14 NOTE — Addendum Note (Signed)
Addended by: Prescott GumLAND, Arionna Hoggard M on: 06/14/2015 05:41 PM   Modules accepted: Orders

## 2015-06-18 LAB — URINE CULTURE

## 2015-06-24 DIAGNOSIS — R531 Weakness: Secondary | ICD-10-CM | POA: Diagnosis not present

## 2015-06-24 DIAGNOSIS — R05 Cough: Secondary | ICD-10-CM | POA: Diagnosis not present

## 2015-06-24 DIAGNOSIS — K219 Gastro-esophageal reflux disease without esophagitis: Secondary | ICD-10-CM | POA: Diagnosis not present

## 2015-06-24 DIAGNOSIS — E7800 Pure hypercholesterolemia, unspecified: Secondary | ICD-10-CM | POA: Diagnosis not present

## 2015-06-24 DIAGNOSIS — I10 Essential (primary) hypertension: Secondary | ICD-10-CM | POA: Diagnosis not present

## 2015-06-24 DIAGNOSIS — Z7902 Long term (current) use of antithrombotics/antiplatelets: Secondary | ICD-10-CM | POA: Diagnosis not present

## 2015-06-24 DIAGNOSIS — I519 Heart disease, unspecified: Secondary | ICD-10-CM | POA: Diagnosis not present

## 2015-06-24 DIAGNOSIS — Z79899 Other long term (current) drug therapy: Secondary | ICD-10-CM | POA: Diagnosis not present

## 2015-06-24 DIAGNOSIS — I252 Old myocardial infarction: Secondary | ICD-10-CM | POA: Diagnosis not present

## 2015-06-28 ENCOUNTER — Ambulatory Visit (INDEPENDENT_AMBULATORY_CARE_PROVIDER_SITE_OTHER): Payer: Commercial Managed Care - HMO | Admitting: Family

## 2015-06-28 ENCOUNTER — Other Ambulatory Visit: Payer: Self-pay | Admitting: Family

## 2015-06-28 ENCOUNTER — Encounter: Payer: Self-pay | Admitting: Family

## 2015-06-28 VITALS — BP 130/67 | HR 67 | Temp 97.6°F | Ht 60.0 in | Wt 148.0 lb

## 2015-06-28 DIAGNOSIS — Z9181 History of falling: Secondary | ICD-10-CM

## 2015-06-28 DIAGNOSIS — R300 Dysuria: Secondary | ICD-10-CM

## 2015-06-28 DIAGNOSIS — R531 Weakness: Secondary | ICD-10-CM

## 2015-06-28 DIAGNOSIS — Z23 Encounter for immunization: Secondary | ICD-10-CM | POA: Diagnosis not present

## 2015-06-28 LAB — POCT UA - MICROSCOPIC ONLY
BACTERIA, U MICROSCOPIC: NEGATIVE
Casts, Ur, LPF, POC: NEGATIVE
Crystals, Ur, HPF, POC: NEGATIVE
MUCUS UA: NEGATIVE

## 2015-06-28 LAB — POCT URINALYSIS DIPSTICK
Bilirubin, UA: NEGATIVE
Glucose, UA: NEGATIVE
NITRITE UA: NEGATIVE
PROTEIN UA: NEGATIVE
SPEC GRAV UA: 1.025
UROBILINOGEN UA: NEGATIVE
pH, UA: 5

## 2015-06-28 MED ORDER — FLUCONAZOLE 150 MG PO TABS: 150.0000 mg | ORAL_TABLET | Freq: Once | ORAL | Status: DC

## 2015-06-28 NOTE — Patient Instructions (Signed)
Weakness  Weakness is a lack of strength. It may be felt all over the body (generalized) or in one specific part of the body (focal). Some causes of weakness can be serious. You may need further medical evaluation, especially if you are elderly or you have a history of immunosuppression (such as chemotherapy or HIV), kidney disease, heart disease, or diabetes.  CAUSES   Weakness can be caused by many different things, including:   Infection.   Physical exhaustion.   Internal bleeding or other blood loss that results in a lack of red blood cells (anemia).   Dehydration. This cause is more common in elderly people.   Side effects or electrolyte abnormalities from medicines, such as pain medicines or sedatives.   Emotional distress, anxiety, or depression.   Circulation problems, especially severe peripheral arterial disease.   Heart disease, such as rapid atrial fibrillation, bradycardia, or heart failure.   Nervous system disorders, such as Guillain-Barr syndrome, multiple sclerosis, or stroke.  DIAGNOSIS   To find the cause of your weakness, your caregiver will take your history and perform a physical exam. Lab tests or X-rays may also be ordered, if needed.  TREATMENT   Treatment of weakness depends on the cause of your symptoms and can vary greatly.  HOME CARE INSTRUCTIONS    Rest as needed.   Eat a well-balanced diet.   Try to get some exercise every day.   Only take over-the-counter or prescription medicines as directed by your caregiver.  SEEK MEDICAL CARE IF:    Your weakness seems to be getting worse or spreads to other parts of your body.   You develop new aches or pains.  SEEK IMMEDIATE MEDICAL CARE IF:    You cannot perform your normal daily activities, such as getting dressed and feeding yourself.   You cannot walk up and down stairs, or you feel exhausted when you do so.   You have shortness of breath or chest pain.   You have difficulty moving parts of your body.   You have weakness  in only one area of the body or on only one side of the body.   You have a fever.   You have trouble speaking or swallowing.   You cannot control your bladder or bowel movements.   You have black or bloody vomit or stools.  MAKE SURE YOU:   Understand these instructions.   Will watch your condition.   Will get help right away if you are not doing well or get worse.     This information is not intended to replace advice given to you by your health care provider. Make sure you discuss any questions you have with your health care provider.     Document Released: 08/18/2005 Document Revised: 02/17/2012 Document Reviewed: 10/17/2011  Elsevier Interactive Patient Education 2016 [employer] Inc[employer]    Fall Prevention in the Home   Falls can cause injuries and can affect people from all age groups. There are many simple things that you can do to make your home safe and to help prevent falls.  WHAT CAN I DO ON THE OUTSIDE OF MY HOME?   Regularly repair the edges of walkways and driveways and fix any cracks.   Remove high doorway thresholds.   Trim any shrubbery on the main path into your home.   Use bright outdoor lighting.   Clear walkways of debris and clutter, including tools and rocks.   Regularly check that handrails are securely fastened and in   good repair. Both sides of any steps should have handrails.   Install guardrails along the edges of any raised decks or porches.   Have leaves, snow, and ice cleared regularly.   Use sand or salt on walkways during winter months.   In the garage, clean up any spills right away, including grease or oil spills.  WHAT CAN I DO IN THE BATHROOM?   Use night lights.   Install grab bars by the toilet and in the tub and shower. Do not use towel bars as grab bars.   Use non-skid mats or decals on the floor of the tub or shower.   If you need to sit down while you are in the shower, use a plastic, non-slip stool..   Keep the floor dry. Immediately clean up any water that  spills on the floor.   Remove soap buildup in the tub or shower on a regular basis.   Attach bath mats securely with double-sided non-slip rug tape.   Remove throw rugs and other tripping hazards from the floor.  WHAT CAN I DO IN THE BEDROOM?   Use night lights.   Make sure that a bedside light is easy to reach.   Do not use oversized bedding that drapes onto the floor.   Have a firm chair that has side arms to use for getting dressed.   Remove throw rugs and other tripping hazards from the floor.  WHAT CAN I DO IN THE KITCHEN?    Clean up any spills right away.   Avoid walking on wet floors.   Place frequently used items in easy-to-reach places.   If you need to reach for something above you, use a sturdy step stool that has a grab bar.   Keep electrical cables out of the way.   Do not use floor polish or wax that makes floors slippery. If you have to use wax, make sure that it is non-skid floor wax.   Remove throw rugs and other tripping hazards from the floor.  WHAT CAN I DO IN THE STAIRWAYS?   Do not leave any items on the stairs.   Make sure that there are handrails on both sides of the stairs. Fix handrails that are broken or loose. Make sure that handrails are as long as the stairways.   Check any carpeting to make sure that it is firmly attached to the stairs. Fix any carpet that is loose or worn.   Avoid having throw rugs at the top or bottom of stairways, or secure the rugs with carpet tape to prevent them from moving.   Make sure that you have a light switch at the top of the stairs and the bottom of the stairs. If you do not have them, have them installed.  WHAT ARE SOME OTHER FALL PREVENTION TIPS?   Wear closed-toe shoes that fit well and support your feet. Wear shoes that have rubber soles or low heels.   When you use a stepladder, make sure that it is completely opened and that the sides are firmly locked. Have someone hold the ladder while you are using it. Do not climb a closed  stepladder.   Add color or contrast paint or tape to grab bars and handrails in your home. Place contrasting color strips on the first and last steps.   Use mobility aids as needed, such as canes, walkers, scooters, and crutches.   Turn on lights if it is dark. Replace any light bulbs that burn   out.   Set up furniture so that there are clear paths. Keep the furniture in the same spot.   Fix any uneven floor surfaces.   Choose a carpet design that does not hide the edge of steps of a stairway.   Be aware of any and all pets.   Review your medicines with your healthcare provider. Some medicines can cause dizziness or changes in blood pressure, which increase your risk of falling.  Talk with your health care provider about other ways that you can decrease your risk of falls. This may include working with a physical therapist or trainer to improve your strength, balance, and endurance.     This information is not intended to replace advice given to you by your health care provider. Make sure you discuss any questions you have with your health care provider.     Document Released: 08/08/2002 Document Revised: 01/02/2015 Document Reviewed: 09/22/2014  Elsevier Interactive Patient Education 2016 Elsevier Inc.

## 2015-06-28 NOTE — Progress Notes (Signed)
   Subjective:    Patient ID: [name], female    DOB: 1930/11/05, 81 y.o.   MRN: [id number]  HPI Pt presents to the office today for ED follow up for weakness. Pt states she has been feeling weak for a several days, but has not felt good in a "long time". Pt states she has some slight burning while urinating. Patient states she is scared she is going to fall.    Review of Systems  Constitutional: Negative.   HENT: Negative.   Eyes: Negative.   Respiratory: Negative.  Negative for shortness of breath.   Cardiovascular: Negative.  Negative for palpitations.  Gastrointestinal: Negative.   Endocrine: Negative.   Genitourinary: Negative.   Musculoskeletal: Negative.   Neurological: Negative.  Negative for headaches.  Hematological: Negative.   Psychiatric/Behavioral: Negative.   All other systems reviewed and are negative.      Objective:   Physical Exam  Constitutional: She is oriented to person, place, and time. She appears well-developed and well-nourished. No distress.  HENT:  Head: Normocephalic and atraumatic.  Eyes: Pupils are equal, round, and reactive to light.  Neck: Normal range of motion. Neck supple. No thyromegaly present.  Cardiovascular: Normal rate, regular rhythm, normal heart sounds and intact distal pulses.   No murmur heard. Pulmonary/Chest: Effort normal and breath sounds normal. No respiratory distress. She has no wheezes.  Abdominal: Soft. Bowel sounds are normal. She exhibits no distension. There is no tenderness.  Musculoskeletal: Normal range of motion. She exhibits no edema or tenderness.  Neurological: She is alert and oriented to person, place, and time. She has normal reflexes. No cranial nerve deficit.  Skin: Skin is warm and dry.  Psychiatric: She has a normal mood and affect. Her behavior is normal. Judgment and thought content normal.  Vitals reviewed.     BP 130/67 mmHg  Pulse 67  Temp(Src) 97.6 F (36.4 C) (Oral)  Ht 5' (1.524 m)   Wt 148 lb (67.132 kg)  BMI 28.90 kg/m2     Assessment & Plan:  1. Weakness -Falls precaution discussed - Ambulatory referral to Physical Therapy -Ensure TID with meals - Anemia Profile B - CMP14+EGFR - POCT urinalysis dipstick - POCT UA - Microscopic Only  2. Risk for falls - Ambulatory referral to Physical Therapy - Anemia Profile B - CMP14+EGFR  3. Dysuria -Force fluids -Continue with Lotrimin cream  RTO prn - POCT urinalysis dipstick - POCT UA - Microscopic Only  Evelina Dun, FNP

## 2015-06-28 NOTE — Addendum Note (Signed)
Addended by: Orma RenderHODGES, Wanza Szumski F on: 06/28/2015 10:49 AM   Modules accepted: Orders

## 2015-06-29 DIAGNOSIS — I10 Essential (primary) hypertension: Secondary | ICD-10-CM | POA: Diagnosis not present

## 2015-06-29 DIAGNOSIS — J449 Chronic obstructive pulmonary disease, unspecified: Secondary | ICD-10-CM | POA: Diagnosis not present

## 2015-06-29 DIAGNOSIS — R531 Weakness: Secondary | ICD-10-CM | POA: Diagnosis not present

## 2015-06-29 DIAGNOSIS — I2510 Atherosclerotic heart disease of native coronary artery without angina pectoris: Secondary | ICD-10-CM | POA: Diagnosis not present

## 2015-06-29 DIAGNOSIS — F419 Anxiety disorder, unspecified: Secondary | ICD-10-CM | POA: Diagnosis not present

## 2015-06-30 LAB — URINE CULTURE

## 2015-07-02 DIAGNOSIS — J449 Chronic obstructive pulmonary disease, unspecified: Secondary | ICD-10-CM | POA: Diagnosis not present

## 2015-07-02 DIAGNOSIS — I10 Essential (primary) hypertension: Secondary | ICD-10-CM | POA: Diagnosis not present

## 2015-07-02 DIAGNOSIS — F419 Anxiety disorder, unspecified: Secondary | ICD-10-CM | POA: Diagnosis not present

## 2015-07-02 DIAGNOSIS — I2510 Atherosclerotic heart disease of native coronary artery without angina pectoris: Secondary | ICD-10-CM | POA: Diagnosis not present

## 2015-07-02 DIAGNOSIS — R531 Weakness: Secondary | ICD-10-CM | POA: Diagnosis not present

## 2015-07-03 ENCOUNTER — Telehealth: Payer: Self-pay | Admitting: Family

## 2015-07-03 ENCOUNTER — Telehealth: Payer: Self-pay | Admitting: Cardiology

## 2015-07-03 DIAGNOSIS — I2510 Atherosclerotic heart disease of native coronary artery without angina pectoris: Secondary | ICD-10-CM | POA: Diagnosis not present

## 2015-07-03 DIAGNOSIS — F419 Anxiety disorder, unspecified: Secondary | ICD-10-CM | POA: Diagnosis not present

## 2015-07-03 DIAGNOSIS — J449 Chronic obstructive pulmonary disease, unspecified: Secondary | ICD-10-CM | POA: Diagnosis not present

## 2015-07-03 DIAGNOSIS — R531 Weakness: Secondary | ICD-10-CM | POA: Diagnosis not present

## 2015-07-03 DIAGNOSIS — I10 Essential (primary) hypertension: Secondary | ICD-10-CM | POA: Diagnosis not present

## 2015-07-03 NOTE — Telephone Encounter (Signed)
Son is concerned about her feeling weak and wobbling. [facility] and was told to take her to her PCP.  They have been told to contact our office.

## 2015-07-04 NOTE — Telephone Encounter (Signed)
Spoke with patient and she said that she didn't know why her son called and that we would need to speak with him. Nurse advised patient that we did not have authorization to speak with her son and if she wanted us to discuss this with him, that she needed to fill out the DPR form giving our office permission to speak with him about her medical records.

## 2015-07-04 NOTE — Telephone Encounter (Signed)
Son informed that DPR needed to be filled out. Also advised son that nothing was mentioned in PCP note r/e patient seeing the cardiologist for weakness and being wobbly. Nurse advised son that if PCP wanted an appointment that PCP would need to contact us directly with the request. Son verbalized understanding of plan.

## 2015-07-06 ENCOUNTER — Ambulatory Visit (INDEPENDENT_AMBULATORY_CARE_PROVIDER_SITE_OTHER): Payer: Commercial Managed Care - HMO | Admitting: Urology

## 2015-07-06 DIAGNOSIS — N3946 Mixed incontinence: Secondary | ICD-10-CM

## 2015-07-06 DIAGNOSIS — N390 Urinary tract infection, site not specified: Secondary | ICD-10-CM | POA: Diagnosis not present

## 2015-07-06 DIAGNOSIS — N952 Postmenopausal atrophic vaginitis: Secondary | ICD-10-CM

## 2015-07-09 ENCOUNTER — Other Ambulatory Visit: Payer: Self-pay | Admitting: Cardiology

## 2015-07-09 DIAGNOSIS — J449 Chronic obstructive pulmonary disease, unspecified: Secondary | ICD-10-CM | POA: Diagnosis not present

## 2015-07-09 DIAGNOSIS — F419 Anxiety disorder, unspecified: Secondary | ICD-10-CM | POA: Diagnosis not present

## 2015-07-09 DIAGNOSIS — I2510 Atherosclerotic heart disease of native coronary artery without angina pectoris: Secondary | ICD-10-CM | POA: Diagnosis not present

## 2015-07-09 DIAGNOSIS — R531 Weakness: Secondary | ICD-10-CM | POA: Diagnosis not present

## 2015-07-09 DIAGNOSIS — I10 Essential (primary) hypertension: Secondary | ICD-10-CM | POA: Diagnosis not present

## 2015-07-10 ENCOUNTER — Encounter: Payer: Self-pay | Admitting: Family Medicine

## 2015-07-10 ENCOUNTER — Ambulatory Visit (INDEPENDENT_AMBULATORY_CARE_PROVIDER_SITE_OTHER): Payer: Commercial Managed Care - HMO

## 2015-07-10 ENCOUNTER — Ambulatory Visit (INDEPENDENT_AMBULATORY_CARE_PROVIDER_SITE_OTHER): Payer: Commercial Managed Care - HMO | Admitting: Family Medicine

## 2015-07-10 VITALS — BP 133/65 | HR 65 | Temp 96.0°F | Ht 60.0 in | Wt 149.0 lb

## 2015-07-10 DIAGNOSIS — R1032 Left lower quadrant pain: Secondary | ICD-10-CM

## 2015-07-10 DIAGNOSIS — N3090 Cystitis, unspecified without hematuria: Secondary | ICD-10-CM

## 2015-07-10 LAB — POCT URINALYSIS DIPSTICK
BILIRUBIN UA: NEGATIVE
GLUCOSE UA: NEGATIVE
Ketones, UA: NEGATIVE
NITRITE UA: NEGATIVE
PH UA: 5
Spec Grav, UA: >=1.03
UROBILINOGEN UA: NEGATIVE

## 2015-07-10 LAB — POCT UA - MICROSCOPIC ONLY
CRYSTALS, UR, HPF, POC: NEGATIVE
Casts, Ur, LPF, POC: NEGATIVE
Mucus, UA: NEGATIVE
Yeast, UA: NEGATIVE

## 2015-07-10 MED ORDER — LEVOFLOXACIN 250 MG PO TABS
250.0000 mg | ORAL_TABLET | Freq: Every day | ORAL | Status: DC
Start: 2015-07-10 — End: 2015-11-01

## 2015-07-10 NOTE — Progress Notes (Signed)
Subjective:  Patient ID: [name], female    DOB: Jun 27, 1931  Age: 81 y.o. MRN: [id number]  CC: Abdominal Pain   HPI Suzanne MillinKatherine Kazmi presents for one day left upper quadrant pain. It can get quite severe. Patient is not a great historian. Her son is with her also has some trouble giving history. Apparently the symptoms started this morning at one point in the interview at another point it may have been there for 2 weeks. What is evident is that she is having dysuria frequency and urgency. She is not having fever nor is she having any nausea or vomiting. However she is having left-sided abdominal pain that is both upper and lower usually. It is apparent that she has this fairly frequently. Her son relates it to her having to take a pill twice a day as opposed to once a day and he requests an antibiotic that can be given once daily.   History Natalia LeatherwoodKatherine has a past medical history of Coronary atherosclerosis of native coronary artery; Mixed hyperlipidemia; GERD (gastroesophageal reflux disease); Essential hypertension, benign; Anxiety; Resting tremor; COPD (chronic obstructive pulmonary disease) (HCC); Rickets; MI (myocardial infarction) (HCC) (2008); Pelvic fracture (HCC) (08/2014); Esophageal stricture; and Osteoporosis.   She has past surgical history that includes Appendectomy; Cholecystectomy; Bilateral cataract surgery; Vein ligation and stripping; Cardiac catheterization; Coronary angioplasty with stent; Left Trochanteric Bursa Injection (11/27/11); Left Sacroiliac Joint Injection (11/27/11); and Yag laser application (09/06/2012).   Her family history includes Hypertension in an other family member.She reports that she has never smoked. She has never used smokeless tobacco. She reports that she does not drink alcohol or use illicit drugs.  Outpatient Prescriptions Prior to Visit  Medication Sig Dispense Refill  . acetaminophen (TYLENOL) 500 MG tablet Take 500 mg by mouth every 6 (six)  hours as needed. pain    . albuterol (PROVENTIL HFA;VENTOLIN HFA) 108 (90 BASE) MCG/ACT inhaler Inhale 2 puffs into the lungs every 6 (six) hours as needed for wheezing or shortness of breath. 1 Inhaler 0  . albuterol (PROVENTIL) (2.5 MG/3ML) 0.083% nebulizer solution Take 3 mLs (2.5 mg total) by nebulization every 6 (six) hours as needed for wheezing or shortness of breath. 150 mL 1  . atorvastatin (LIPITOR) 20 MG tablet TAKE 1 TABLET BY MOUTH DAILY AT 6 PM. 90 tablet 1  . calcium citrate-vitamin D (CITRACAL+D) 315-200 MG-UNIT per tablet Take 2 tablets by mouth daily.    . cholecalciferol (VITAMIN D) 1000 UNITS tablet Take 4,000 Units by mouth daily.    . clotrimazole (LOTRIMIN) 1 % cream Apply 1 application topically 2 (two) times daily. 30 g 1  . ESTRACE VAGINAL 0.1 MG/GM vaginal cream     . fluticasone (FLONASE) 50 MCG/ACT nasal spray Place 2 sprays into both nostrils daily. 16 g 6  . LORazepam (ATIVAN) 0.5 MG tablet     . mirabegron ER (MYRBETRIQ) 50 MG TB24 tablet Take 50 mg by mouth daily.    . nitroGLYCERIN (NITROSTAT) 0.4 MG SL tablet Place 1 tablet (0.4 mg total) under the tongue every 5 (five) minutes as needed. 25 tablet 3  . pantoprazole (PROTONIX) 40 MG tablet TAKE 1 TABLET BY MOUTH EVERY DAY AT NOON 30 tablet 5  . clopidogrel (PLAVIX) 75 MG tablet Take 1 tablet (75 mg total) by mouth daily. 30 tablet 6  . isosorbide mononitrate (IMDUR) 30 MG 24 hr tablet Take 1 tablet (30 mg total) by mouth 2 (two) times daily. 60 tablet 6  . lisinopril (PRINIVIL,ZESTRIL)  10 MG tablet Take 1 tablet (10 mg total) by mouth daily. 30 tablet 6  . fluconazole (DIFLUCAN) 150 MG tablet Take 1 tablet (150 mg total) by mouth once. (Patient not taking: Reported on 07/10/2015) 1 tablet 0   No facility-administered medications prior to visit.    ROS Review of Systems  Constitutional: Negative for fever, activity change and appetite change.  HENT: Negative for congestion, rhinorrhea and sore throat.   Eyes:  Negative for pain and visual disturbance.  Respiratory: Negative for cough and shortness of breath.   Gastrointestinal: Negative for nausea and abdominal pain.  Musculoskeletal: Negative for myalgias and arthralgias.    Objective:  BP 133/65 mmHg  Pulse 65  Temp(Src) 96 F (35.6 C) (Oral)  Ht 5' (1.524 m)  Wt 149 lb (67.586 kg)  BMI 29.10 kg/m2  SpO2 96%  BP Readings from Last 3 Encounters:  07/10/15 133/65  06/28/15 130/67  06/14/15 153/71    Wt Readings from Last 3 Encounters:  07/10/15 149 lb (67.586 kg)  06/28/15 148 lb (67.132 kg)  06/14/15 151 lb (68.493 kg)     Physical Exam  Constitutional: She is oriented to person, place, and time. She appears well-developed and well-nourished. No distress.  HENT:  Head: Normocephalic and atraumatic.  Right Ear: External ear normal.  Left Ear: External ear normal.  Nose: Nose normal.  Mouth/Throat: Oropharynx is clear and moist.  Eyes: Conjunctivae and EOM are normal. Pupils are equal, round, and reactive to light.  Neck: Normal range of motion. Neck supple. No thyromegaly present.  Cardiovascular: Normal rate, regular rhythm and normal heart sounds.   No murmur heard. Pulmonary/Chest: Effort normal and breath sounds normal. No respiratory distress. She has no wheezes. She has no rales.  Abdominal: Soft. Bowel sounds are normal. She exhibits no distension and no mass. There is tenderness. There is no rebound and no guarding.  Lymphadenopathy:    She has no cervical adenopathy.  Neurological: She is alert and oriented to person, place, and time. She has normal reflexes.  Skin: Skin is warm and dry.  Psychiatric: She has a normal mood and affect. Her behavior is normal. Judgment and thought content normal.    Lab Results  Component Value Date   HGBA1C 5.8* 06/06/2012    Lab Results  Component Value Date   WBC 5.0 12/11/2014   HGB 11.9* 12/11/2014   HCT 37.6 12/11/2014   PLT 174 12/11/2014   GLUCOSE 89 03/21/2015    CHOL 146 11/09/2014   TRIG 88 11/09/2014   HDL 72 11/09/2014   LDLCALC 56 11/09/2014   ALT 9 12/11/2014   AST 16 12/11/2014   NA 141 03/21/2015   K 4.3 03/21/2015   CL 101 03/21/2015   CREATININE 0.90 03/21/2015   BUN 19 03/21/2015   CO2 23 03/21/2015   TSH 2.270 05/12/2014   INR 0.96 06/22/2012   HGBA1C 5.8* 06/06/2012   Results for orders placed or performed in visit on 07/10/15  Urine culture  Result Value Ref Range   Urine Culture, Routine Final report    Urine Culture result 1 Comment   POCT urinalysis dipstick  Result Value Ref Range   Color, UA amber    Clarity, UA clear    Glucose, UA neg    Bilirubin, UA neg    Ketones, UA neg    Spec Grav, UA >=1.030    Blood, UA large    pH, UA 5.0    Protein, UA 4+  Urobilinogen, UA negative    Nitrite, UA neg    Leukocytes, UA large (3+) (A) Negative  POCT UA - Microscopic Only  Result Value Ref Range   WBC, Ur, HPF, POC 80-100    RBC, urine, microscopic 5-8    Bacteria, U Microscopic many    Mucus, UA neg    Epithelial cells, urine per micros many    Crystals, Ur, HPF, POC neg    Casts, Ur, LPF, POC neg    Yeast, UA neg     No results found.  Assessment & Plan:   Willo was seen today for abdominal pain.  Diagnoses and all orders for this visit:  LLQ pain -     POCT urinalysis dipstick -     POCT UA - Microscopic Only -     DG Abd 1 View -     Urine culture  Cystitis  Other orders -     levofloxacin (LEVAQUIN) 250 MG tablet; Take 1 tablet (250 mg total) by mouth daily. For 5 days for bladder infection   I am having Ms. Delong start on levofloxacin. I am also having her maintain her acetaminophen, nitroGLYCERIN, cholecalciferol, calcium citrate-vitamin D, albuterol, albuterol, mirabegron ER, clotrimazole, pantoprazole, LORazepam, ESTRACE VAGINAL, atorvastatin, fluticasone, and fluconazole.  Meds ordered this encounter  Medications  . levofloxacin (LEVAQUIN) 250 MG tablet    Sig: Take 1 tablet  (250 mg total) by mouth daily. For 5 days for bladder infection    Dispense:  5 tablet    Refill:  0    Patient's noted reaction to Cipro was a side effect rather than allergic in origin. She should tolerate the levofloxacin better.     Follow-up: No Follow-up on file.  Mechele Claude, M.D.

## 2015-07-12 LAB — URINE CULTURE

## 2015-07-13 ENCOUNTER — Ambulatory Visit (INDEPENDENT_AMBULATORY_CARE_PROVIDER_SITE_OTHER): Payer: Commercial Managed Care - HMO | Admitting: Family

## 2015-07-13 ENCOUNTER — Encounter: Payer: Self-pay | Admitting: Family

## 2015-07-13 ENCOUNTER — Ambulatory Visit (INDEPENDENT_AMBULATORY_CARE_PROVIDER_SITE_OTHER): Payer: Commercial Managed Care - HMO

## 2015-07-13 VITALS — BP 150/84 | HR 70 | Temp 98.0°F | Ht 60.0 in | Wt 147.6 lb

## 2015-07-13 DIAGNOSIS — R05 Cough: Secondary | ICD-10-CM

## 2015-07-13 DIAGNOSIS — R059 Cough, unspecified: Secondary | ICD-10-CM

## 2015-07-13 DIAGNOSIS — F419 Anxiety disorder, unspecified: Secondary | ICD-10-CM | POA: Diagnosis not present

## 2015-07-13 DIAGNOSIS — R531 Weakness: Secondary | ICD-10-CM | POA: Diagnosis not present

## 2015-07-13 DIAGNOSIS — J069 Acute upper respiratory infection, unspecified: Secondary | ICD-10-CM

## 2015-07-13 DIAGNOSIS — I2510 Atherosclerotic heart disease of native coronary artery without angina pectoris: Secondary | ICD-10-CM | POA: Diagnosis not present

## 2015-07-13 DIAGNOSIS — J449 Chronic obstructive pulmonary disease, unspecified: Secondary | ICD-10-CM | POA: Diagnosis not present

## 2015-07-13 DIAGNOSIS — I10 Essential (primary) hypertension: Secondary | ICD-10-CM | POA: Diagnosis not present

## 2015-07-13 DIAGNOSIS — J309 Allergic rhinitis, unspecified: Secondary | ICD-10-CM | POA: Diagnosis not present

## 2015-07-13 MED ORDER — FLUTICASONE PROPIONATE 50 MCG/ACT NA SUSP: 2.0000 | Freq: Every day | NASAL | Status: DC

## 2015-07-13 MED ORDER — METHYLPREDNISOLONE 4 MG PO TBPK: ORAL_TABLET | ORAL | Status: DC

## 2015-07-13 NOTE — Patient Instructions (Signed)
Upper Respiratory Infection, Adult Most upper respiratory infections (URIs) are a viral infection of the air passages leading to the lungs. A URI affects the nose, throat, and upper air passages. The most common type of URI is nasopharyngitis and is typically referred to as "the common cold." URIs run their course and usually go away on their own. Most of the time, a URI does not require medical attention, but sometimes a bacterial infection in the upper airways can follow a viral infection. This is called a secondary infection. Sinus and middle ear infections are common types of secondary upper respiratory infections. Bacterial pneumonia can also complicate a URI. A URI can worsen asthma and chronic obstructive pulmonary disease (COPD). Sometimes, these complications can require emergency medical care and may be life threatening.  CAUSES Almost all URIs are caused by viruses. A virus is a type of germ and can spread from one person to another.  RISKS FACTORS You may be at risk for a URI if:   You smoke.   You have chronic heart or lung disease.  You have a weakened defense (immune) system.   You are very young or very old.   You have nasal allergies or asthma.  You work in crowded or poorly ventilated areas.  You work in health care facilities or schools. SIGNS AND SYMPTOMS  Symptoms typically develop 2-3 days after you come in contact with a cold virus. Most viral URIs last 7-10 days. However, viral URIs from the influenza virus (flu virus) can last 14-18 days and are typically more severe. Symptoms may include:   Runny or stuffy (congested) nose.   Sneezing.   Cough.   Sore throat.   Headache.   Fatigue.   Fever.   Loss of appetite.   Pain in your forehead, behind your eyes, and over your cheekbones (sinus pain).  Muscle aches.  DIAGNOSIS  Your health care provider may diagnose a URI by:  Physical exam.  Tests to check that your symptoms are not due to  another condition such as:  Strep throat.  Sinusitis.  Pneumonia.  Asthma. TREATMENT  A URI goes away on its own with time. It cannot be cured with medicines, but medicines may be prescribed or recommended to relieve symptoms. Medicines may help:  Reduce your fever.  Reduce your cough.  Relieve nasal congestion. HOME CARE INSTRUCTIONS   Take medicines only as directed by your health care provider.   Gargle warm saltwater or take cough drops to comfort your throat as directed by your health care provider.  Use a warm mist humidifier or inhale steam from a shower to increase air moisture. This may make it easier to breathe.  Drink enough fluid to keep your urine clear or pale yellow.   Eat soups and other clear broths and maintain good nutrition.   Rest as needed.   Return to work when your temperature has returned to normal or as your health care provider advises. You may need to stay home longer to avoid infecting others. You can also use a face mask and careful hand washing to prevent spread of the virus.  Increase the usage of your inhaler if you have asthma.   Do not use any tobacco products, including cigarettes, chewing tobacco, or electronic cigarettes. If you need help quitting, ask your health care provider. PREVENTION  The best way to protect yourself from getting a cold is to practice good hygiene.   Avoid oral or hand contact with people with cold   symptoms.   Wash your hands often if contact occurs.  There is no clear evidence that vitamin C, vitamin E, echinacea, or exercise reduces the chance of developing a cold. However, it is always recommended to get plenty of rest, exercise, and practice good nutrition.  SEEK MEDICAL CARE IF:   You are getting worse rather than better.   Your symptoms are not controlled by medicine.   You have chills.  You have worsening shortness of breath.  You have brown or red mucus.  You have yellow or brown nasal  discharge.  You have pain in your face, especially when you bend forward.  You have a fever.  You have swollen neck glands.  You have pain while swallowing.  You have white areas in the back of your throat. SEEK IMMEDIATE MEDICAL CARE IF:   You have severe or persistent:  Headache.  Ear pain.  Sinus pain.  Chest pain.  You have chronic lung disease and any of the following:  Wheezing.  Prolonged cough.  Coughing up blood.  A change in your usual mucus.  You have a stiff neck.  You have changes in your:  Vision.  Hearing.  Thinking.  Mood. MAKE SURE YOU:   Understand these instructions.  Will watch your condition.  Will get help right away if you are not doing well or get worse.   This information is not intended to replace advice given to you by your health care provider. Make sure you discuss any questions you have with your health care provider.   Document Released: 02/11/2001 Document Revised: 01/02/2015 Document Reviewed: 11/23/2013 Elsevier Interactive Patient Education 2016 [employer] Inc.  

## 2015-07-13 NOTE — Progress Notes (Signed)
Subjective:    Patient ID: [name], female    DOB: August 05, 1931, 81 y.o.   MRN: [id number]   Back Pain This is a new problem. The current episode started in the past 7 days. The problem occurs constantly. The problem is unchanged. The pain is present in the thoracic spine. The quality of the pain is described as aching. The pain does not radiate. The pain is at a severity of 10/10. The pain is severe. The symptoms are aggravated by lying down. Pertinent negatives include no bladder incontinence, bowel incontinence, headaches, leg pain, numbness, tingling or weakness. She has tried bed rest and NSAIDs for the symptoms. The treatment provided mild relief.   *Pt currently being treated for UTI. Pt states she has one more day of  Levaquin.    Review of Systems  Constitutional: Negative.   HENT: Negative.   Eyes: Negative.   Respiratory: Negative.  Negative for shortness of breath.   Cardiovascular: Negative.  Negative for palpitations.  Gastrointestinal: Negative.  Negative for bowel incontinence.  Endocrine: Negative.   Genitourinary: Negative.  Negative for bladder incontinence.  Musculoskeletal: Positive for back pain.  Neurological: Negative.  Negative for tingling, weakness, numbness and headaches.  Hematological: Negative.   Psychiatric/Behavioral: Negative.   All other systems reviewed and are negative.      Objective:   Physical Exam  Constitutional: She is oriented to person, place, and time. She appears well-developed and well-nourished. No distress.  HENT:  Head: Normocephalic and atraumatic.  Right Ear: External ear normal.  Left Ear: External ear normal.  Nasal passage erythemas with mild swelling  Oropharynx erythemas   Eyes: Pupils are equal, round, and reactive to light.  Neck: Normal range of motion. Neck supple. No thyromegaly present.  Cardiovascular: Normal rate, regular rhythm, normal heart sounds and intact distal pulses.   No murmur  heard. Pulmonary/Chest: Effort normal and breath sounds normal. No respiratory distress. She has no wheezes.  Nonproductive cough   Abdominal: Soft. Bowel sounds are normal. She exhibits no distension. There is no tenderness.  Musculoskeletal: Normal range of motion. She exhibits no edema or tenderness.  Neurological: She is alert and oriented to person, place, and time. She has normal reflexes. No cranial nerve deficit.  Skin: Skin is warm and dry.  Psychiatric: She has a normal mood and affect. Her behavior is normal. Judgment and thought content normal.  Vitals reviewed.  Chest x-ray- Wnl Preliminary reading by Jannifer Rodney, FNP [facility]    BP 150/84 mmHg  Pulse 70  Temp(Src) 98 F (36.7 C) (Oral)  Ht 5' (1.524 m)  Wt 147 lb 9.6 oz (66.951 kg)  BMI 28.83 kg/m2     Assessment & Plan:  1. Cough - DG Chest 2 View; Future  2. Allergic rhinitis, unspecified allergic rhinitis type - fluticasone (FLONASE) 50 MCG/ACT nasal spray; Place 2 sprays into both nostrils daily.  Dispense: 16 g; Refill: 6  3. Acute upper respiratory infection -- Take meds as prescribed - Use a cool mist humidifier  -Use saline nose sprays frequently -Saline irrigations of the nose can be very helpful if done frequently.  * 4X daily for 1 week*  * Use of a nettie pot can be helpful with this. Follow directions with this* -Force fluids -For any cough or congestion  Use plain Mucinex- regular strength or max strength is fine   * Children- consult with Pharmacist for dosing -For fever or aces or pains- take tylenol or ibuprofen appropriate for age  and weight.  * for fevers greater than 101 orally you may alternate ibuprofen and tylenol every  3 hours. -Throat lozenges if helps - methylPREDNISolone (MEDROL DOSEPAK) 4 MG TBPK tablet; Use as directed  Dispense: 21 tablet; Refill: 0 - fluticasone (FLONASE) 50 MCG/ACT nasal spray; Place 2 sprays into both nostrils daily.  Dispense: 16 g; Refill: 6  Jannifer Rodneyhristy  Hawks, FNP

## 2015-07-16 ENCOUNTER — Other Ambulatory Visit: Payer: Self-pay | Admitting: Family

## 2015-07-16 ENCOUNTER — Telehealth: Payer: Self-pay | Admitting: Family

## 2015-07-16 DIAGNOSIS — IMO0002 Reserved for concepts with insufficient information to code with codable children: Secondary | ICD-10-CM

## 2015-07-16 NOTE — Telephone Encounter (Signed)
Pt is aware according to lab result notes.

## 2015-07-16 NOTE — Telephone Encounter (Signed)
Aware of xray results 

## 2015-07-18 DIAGNOSIS — R531 Weakness: Secondary | ICD-10-CM | POA: Diagnosis not present

## 2015-07-18 DIAGNOSIS — I10 Essential (primary) hypertension: Secondary | ICD-10-CM | POA: Diagnosis not present

## 2015-07-18 DIAGNOSIS — I2510 Atherosclerotic heart disease of native coronary artery without angina pectoris: Secondary | ICD-10-CM | POA: Diagnosis not present

## 2015-07-18 DIAGNOSIS — J449 Chronic obstructive pulmonary disease, unspecified: Secondary | ICD-10-CM | POA: Diagnosis not present

## 2015-07-18 DIAGNOSIS — F419 Anxiety disorder, unspecified: Secondary | ICD-10-CM | POA: Diagnosis not present

## 2015-07-20 ENCOUNTER — Encounter: Payer: Self-pay | Admitting: Family

## 2015-07-20 ENCOUNTER — Ambulatory Visit (INDEPENDENT_AMBULATORY_CARE_PROVIDER_SITE_OTHER): Payer: Commercial Managed Care - HMO | Admitting: Family

## 2015-07-20 VITALS — BP 139/77 | HR 78 | Temp 97.1°F | Ht 60.0 in | Wt 148.8 lb

## 2015-07-20 DIAGNOSIS — R509 Fever, unspecified: Secondary | ICD-10-CM | POA: Diagnosis not present

## 2015-07-20 DIAGNOSIS — R350 Frequency of micturition: Secondary | ICD-10-CM | POA: Diagnosis not present

## 2015-07-20 DIAGNOSIS — N3001 Acute cystitis with hematuria: Secondary | ICD-10-CM

## 2015-07-20 LAB — POCT UA - MICROSCOPIC ONLY
Bacteria, U Microscopic: NEGATIVE
Casts, Ur, LPF, POC: NEGATIVE
Crystals, Ur, HPF, POC: NEGATIVE
Mucus, UA: NEGATIVE
YEAST UA: NEGATIVE

## 2015-07-20 LAB — POCT URINALYSIS DIPSTICK
Bilirubin, UA: NEGATIVE
Glucose, UA: NEGATIVE
Ketones, UA: NEGATIVE
Nitrite, UA: NEGATIVE
PH UA: 5
PROTEIN UA: NEGATIVE
SPEC GRAV UA: 1.015
UROBILINOGEN UA: NEGATIVE

## 2015-07-20 MED ORDER — DOXYCYCLINE HYCLATE 100 MG PO TABS: 100.0000 mg | ORAL_TABLET | Freq: Two times a day (BID) | ORAL | Status: DC

## 2015-07-20 NOTE — Progress Notes (Signed)
   Subjective:    Patient ID: Suzanne MillinKatherine [name], female    DOB: October 29, 1930, 81 y.o.   MRN: [id number]  Pt presents to the office today for fever and UTI symptoms. Pt has recurrent UTI's and is followed by uroligsts. Pt states she believes she has an appt in the "next week or so". Pt states she just "pees and pees".  Fever  This is a new problem. The current episode started today. Pertinent negatives include no headaches, nausea or vomiting.  Urinary Tract Infection  This is a new problem. The current episode started today. The problem occurs every urination. The problem has been unchanged. The quality of the pain is described as burning. The pain is mild. Associated symptoms include frequency, hesitancy and urgency. Pertinent negatives include no flank pain, nausea or vomiting. She has tried increased fluids for the symptoms. The treatment provided mild relief. Her past medical history is significant for recurrent UTIs.      Review of Systems  Constitutional: Positive for fever.  HENT: Negative.   Eyes: Negative.   Respiratory: Negative.  Negative for shortness of breath.   Cardiovascular: Negative.  Negative for palpitations.  Gastrointestinal: Negative.  Negative for nausea and vomiting.  Endocrine: Negative.   Genitourinary: Positive for hesitancy, urgency and frequency. Negative for flank pain.  Musculoskeletal: Negative.   Neurological: Negative.  Negative for headaches.  Hematological: Negative.   Psychiatric/Behavioral: Negative.   All other systems reviewed and are negative.       Objective:   Physical Exam  Constitutional: She is oriented to person, place, and time. She appears well-developed and well-nourished. No distress.  Eyes: Pupils are equal, round, and reactive to light.  Neck: Normal range of motion. Neck supple. No thyromegaly present.  Cardiovascular: Normal rate, regular rhythm, normal heart sounds and intact distal pulses.   No murmur heard. Pulmonary/Chest:  Effort normal and breath sounds normal. No respiratory distress. She has no wheezes.  Abdominal: Soft. Bowel sounds are normal. She exhibits no distension. There is tenderness (subpubic abd pain).  Musculoskeletal: Normal range of motion. She exhibits no edema or tenderness.  Negative for CVA tenderness   Neurological: She is alert and oriented to person, place, and time. She has normal reflexes. No cranial nerve deficit.  Skin: Skin is warm and dry.  Psychiatric: She has a normal mood and affect. Her behavior is normal. Judgment and thought content normal.  Vitals reviewed.   BP 139/77 mmHg  Pulse 78  Temp(Src) 97.1 F (36.2 C) (Oral)  Ht 5' (1.524 m)  Wt 148 lb 12.8 oz (67.495 kg)  BMI 29.06 kg/m2       Assessment & Plan:  1. Urination frequency - POCT urinalysis dipstick - POCT UA - Microscopic Only  2. Fever, unspecified fever cause - POCT urinalysis dipstick - POCT UA - Microscopic Only  3. Acute cystitis with hematuria -Force fluids AZO over the counter X2 days RTO prn Culture pending - doxycycline (VIBRA-TABS) 100 MG tablet; Take 1 tablet (100 mg total) by mouth 2 (two) times daily.  Dispense: 20 tablet; Refill: 0 - Urine culture  Jannifer Rodneyhristy Zanasia Hickson, FNP

## 2015-07-20 NOTE — Patient Instructions (Signed)

## 2015-07-22 LAB — URINE CULTURE

## 2015-07-23 ENCOUNTER — Other Ambulatory Visit: Payer: Self-pay | Admitting: Family

## 2015-07-23 MED ORDER — NITROFURANTOIN MONOHYD MACRO 100 MG PO CAPS: 100.0000 mg | ORAL_CAPSULE | Freq: Two times a day (BID) | ORAL | Status: DC

## 2015-07-23 NOTE — Progress Notes (Signed)
Patient's son is aware. Will pick up rx in the morning.

## 2015-07-25 ENCOUNTER — Telehealth: Payer: Self-pay | Admitting: Cardiology

## 2015-07-25 NOTE — Telephone Encounter (Signed)
Can she take Aleve instead of Tylenol she is having back issues and Tylenol not helping

## 2015-07-25 NOTE — Telephone Encounter (Signed)
Patient advised that she has NSAID listed on her allergy list and that aleve would be this type medication and that she should continue with tylenol.

## 2015-08-01 ENCOUNTER — Ambulatory Visit (INDEPENDENT_AMBULATORY_CARE_PROVIDER_SITE_OTHER): Payer: Commercial Managed Care - HMO | Admitting: Urology

## 2015-08-01 ENCOUNTER — Encounter (INDEPENDENT_AMBULATORY_CARE_PROVIDER_SITE_OTHER): Payer: Commercial Managed Care - HMO | Admitting: Family Medicine

## 2015-08-01 DIAGNOSIS — R531 Weakness: Secondary | ICD-10-CM | POA: Diagnosis not present

## 2015-08-01 DIAGNOSIS — I2510 Atherosclerotic heart disease of native coronary artery without angina pectoris: Secondary | ICD-10-CM | POA: Diagnosis not present

## 2015-08-01 DIAGNOSIS — R300 Dysuria: Secondary | ICD-10-CM

## 2015-08-01 DIAGNOSIS — N390 Urinary tract infection, site not specified: Secondary | ICD-10-CM | POA: Diagnosis not present

## 2015-08-01 DIAGNOSIS — I10 Essential (primary) hypertension: Secondary | ICD-10-CM

## 2015-08-01 DIAGNOSIS — J449 Chronic obstructive pulmonary disease, unspecified: Secondary | ICD-10-CM | POA: Diagnosis not present

## 2015-08-01 DIAGNOSIS — R3129 Other microscopic hematuria: Secondary | ICD-10-CM | POA: Diagnosis not present

## 2015-08-29 ENCOUNTER — Ambulatory Visit: Payer: Commercial Managed Care - HMO | Admitting: Urology

## 2015-09-03 DIAGNOSIS — R3981 Functional urinary incontinence: Secondary | ICD-10-CM | POA: Diagnosis not present

## 2015-09-11 ENCOUNTER — Encounter: Payer: Self-pay | Admitting: *Deleted

## 2015-09-12 ENCOUNTER — Ambulatory Visit (INDEPENDENT_AMBULATORY_CARE_PROVIDER_SITE_OTHER): Payer: Commercial Managed Care - HMO | Admitting: Cardiology

## 2015-09-12 ENCOUNTER — Encounter: Payer: Self-pay | Admitting: Cardiology

## 2015-09-12 VITALS — BP 122/78 | HR 80 | Ht 60.0 in | Wt 150.0 lb

## 2015-09-12 DIAGNOSIS — R531 Weakness: Secondary | ICD-10-CM | POA: Diagnosis not present

## 2015-09-12 DIAGNOSIS — I10 Essential (primary) hypertension: Secondary | ICD-10-CM | POA: Diagnosis not present

## 2015-09-12 DIAGNOSIS — E782 Mixed hyperlipidemia: Secondary | ICD-10-CM

## 2015-09-12 DIAGNOSIS — I2510 Atherosclerotic heart disease of native coronary artery without angina pectoris: Secondary | ICD-10-CM

## 2015-09-12 NOTE — Patient Instructions (Signed)
Your physician recommends that you continue on your current medications as directed. Please refer to the Current Medication list given to you today. Your physician recommends that you schedule a follow-up appointment in: 6 months. You will receive a reminder letter in the mail in about 4 months reminding you to call and schedule your appointment. If you don't receive this letter, please contact our office. 

## 2015-09-12 NOTE — Progress Notes (Signed)
Cardiology Office Note  Date: 09/12/2015   ID: [name], DOB Feb 19, 1931, MRN [id number]  PCP: Jannifer Rodney, FNP  Primary Cardiologist: Nona Dell, MD   Chief Complaint  Patient presents with  . Coronary Artery Disease    History of Present Illness:  Asusena [name] is an 82 y.o. female last seen in July 2016. She presents for a routine follow-up visit. She does not report any recent angina symptoms. She states that she has been compliant with her medications, and that her son with whom she lives helps her to get them organized each day. She describes NYHA class II dyspnea with typical activities around her house. She uses a cane, denies any recent falls.  Record review finds ER visit at [facility] back in October 2016 for evaluation of weakness. She was hemodynamically stable at the time based on vital signs. She had a CT scan of the head that reported no acute process with atrophy and chronic small vessel ischemic changes. Chest x-ray reported no acute process. Lab work showed potassium 4.1, BUN 15, creatinine 0.7, AST 13, ALT 7, NT pro-BNP 229, TSH 2.4, hemoglobin 11.9, platelets 195, normal troponin T levels.  We discussed her medications. Cardiac regimen includes Plavix, Imdur, lisinopril, Lipitor, and as needed nitroglycerin. She does not report any bleeding problems.  Lab work from last year is reviewed below, LDL was 56 in March. She reports no intolerance to Lipitor.  Past Medical History  Diagnosis Date  . Coronary atherosclerosis of native coronary artery     DES LAD and BMS LAD 8/08  . Mixed hyperlipidemia   . GERD (gastroesophageal reflux disease)   . Essential hypertension, benign   . Anxiety   . Resting tremor   . COPD (chronic obstructive pulmonary disease) (HCC)   . Rickets   . MI (myocardial infarction) (HCC) 2008  . Pelvic fracture (HCC) 08/2014  . Esophageal stricture   . Osteoporosis     Past Surgical History  Procedure Laterality Date  .  Appendectomy    . Cholecystectomy    . Bilateral cataract surgery    . Vein ligation and stripping    . Cardiac catheterization    . Coronary angioplasty with stent placement    . Left trochanteric bursa injection  11/27/11    DR. O'TOOLE  . Left sacroiliac joint injection  11/27/11    DR. O'TOOLE  . Yag laser application  09/06/2012    Procedure: YAG LASER APPLICATION;  Surgeon: Susa Simmonds, MD;  Location: [facility];  Service: Ophthalmology;  Laterality: Left;    Current Outpatient Prescriptions  Medication Sig Dispense Refill  . acetaminophen (TYLENOL) 500 MG tablet Take 500 mg by mouth every 6 (six) hours as needed. pain    . albuterol (PROVENTIL HFA;VENTOLIN HFA) 108 (90 BASE) MCG/ACT inhaler Inhale 2 puffs into the lungs every 6 (six) hours as needed for wheezing or shortness of breath. 1 Inhaler 0  . albuterol (PROVENTIL) (2.5 MG/3ML) 0.083% nebulizer solution Take 3 mLs (2.5 mg total) by nebulization every 6 (six) hours as needed for wheezing or shortness of breath. 150 mL 1  . atorvastatin (LIPITOR) 20 MG tablet TAKE 1 TABLET BY MOUTH DAILY AT 6 PM. 90 tablet 1  . calcium citrate-vitamin D (CITRACAL+D) 315-200 MG-UNIT per tablet Take 2 tablets by mouth daily.    . cholecalciferol (VITAMIN D) 1000 UNITS tablet Take 4,000 Units by mouth daily.    . clopidogrel (PLAVIX) 75 MG tablet TAKE 1 TABLET BY MOUTH  EVERY DAY 30 tablet 6  . clotrimazole (LOTRIMIN) 1 % cream Apply 1 application topically 2 (two) times daily. 30 g 1  . doxycycline (VIBRA-TABS) 100 MG tablet Take 1 tablet (100 mg total) by mouth 2 (two) times daily. 20 tablet 0  . ESTRACE VAGINAL 0.1 MG/GM vaginal cream     . fluconazole (DIFLUCAN) 150 MG tablet Take 1 tablet (150 mg total) by mouth once. 1 tablet 0  . fluticasone (FLONASE) 50 MCG/ACT nasal spray Place 2 sprays into both nostrils daily. 16 g 6  . isosorbide mononitrate (IMDUR) 30 MG 24 hr tablet TAKE 1 TABLET BY MOUTH TWICE DAILY 60 tablet 6  . levofloxacin  (LEVAQUIN) 250 MG tablet Take 1 tablet (250 mg total) by mouth daily. For 5 days for bladder infection 5 tablet 0  . lisinopril (PRINIVIL,ZESTRIL) 10 MG tablet TAKE 1 TABLET BY MOUTH EVERY DAY 30 tablet 6  . LORazepam (ATIVAN) 0.5 MG tablet     . methylPREDNISolone (MEDROL DOSEPAK) 4 MG TBPK tablet Use as directed 21 tablet 0  . mirabegron ER (MYRBETRIQ) 50 MG TB24 tablet Take 50 mg by mouth daily.    . nitrofurantoin, macrocrystal-monohydrate, (MACROBID) 100 MG capsule Take 1 capsule (100 mg total) by mouth 2 (two) times daily. 10 capsule 0  . nitroGLYCERIN (NITROSTAT) 0.4 MG SL tablet Place 1 tablet (0.4 mg total) under the tongue every 5 (five) minutes as needed. 25 tablet 3  . pantoprazole (PROTONIX) 40 MG tablet TAKE 1 TABLET BY MOUTH EVERY DAY AT NOON 30 tablet 5   No current facility-administered medications for this visit.   Allergies:  Azithromycin; Cephalexin; Ciprofloxacin; Codeine; Contrast media; Latex; Nitrofuran derivatives; Nsaids; Nutritional supplements; Penicillins; Phenazopyridine; Quinolones; Sulfa antibiotics; Toviaz; and Trimethoprim   Social History: The patient  reports that she has never smoked. She has never used smokeless tobacco. She reports that she does not drink alcohol or use illicit drugs.   ROS:  Please see the history of present illness. Otherwise, complete review of systems is positive for chronic tremor, anxiety.  All other systems are reviewed and negative.   Physical Exam: VS:  BP 122/78 mmHg  Pulse 80  Ht 5' (1.524 m)  Wt 150 lb (68.04 kg)  BMI 29.30 kg/m2  SpO2 98%, BMI Body mass index is 29.3 kg/(m^2).  Wt Readings from Last 3 Encounters:  09/12/15 150 lb (68.04 kg)  07/20/15 148 lb 12.8 oz (67.495 kg)  07/13/15 147 lb 9.6 oz (66.951 kg)    Elderly woman in no distress. Uses a cane. HEENT: Conjunctiva and lids normal, oropharynx clear.  Neck: Supple, no elevated jugular venous pressure, no bruits.  Lungs: Clear to auscultation, nonlabored.   Cardiac: Regular rate and rhythm, no S3 gallop.  Abdomen: Soft, nontender, no bruits.  Skin: Warm and dry.  Extremities: No significant peripheral edema  Musculoskeletal: Mild kyphosis. Neuropsychiatric: Alert and oriented 3, chronic tremor.  ECG: Tracing from 06/24/2015 showed normal sinus rhythm.  Recent Labwork: 12/11/2014: ALT 9; AST 16; Hemoglobin 11.9*; Platelets 174 03/21/2015: BUN 19; Creatinine, Ser 0.90; Magnesium 2.4*; Potassium 4.3; Sodium 141     Component Value Date/Time   CHOL 146 11/09/2014 1523   CHOL 135 06/07/2012 0528   TRIG 88 11/09/2014 1523   HDL 72 11/09/2014 1523   HDL 63 06/07/2012 0528   CHOLHDL 2.0 11/09/2014 1523   CHOLHDL 2.1 06/07/2012 0528   VLDL 17 06/07/2012 0528   LDLCALC 56 11/09/2014 1523   LDLCALC 55 06/07/2012 0528  Other Studies Reviewed Today:  Cardiac catheterization 06/20/2011: HEMODYNAMIC DATA: Aortic pressure is 139/60 with a mean of 91 mmHg. Left ventricular pressure is 137 with EDP of 10 mmHg.  ANGIOGRAPHIC DATA: The left coronary artery arises and distributes normally. The left main coronary artery is normal.  The left anterior descending artery demonstrates that the proximal and mid to distal vessel stents are widely patent. Between the stents in the mid vessel, there is diffuse 30% narrowing. There is a small first diagonal branch that has a focal 70% stenosis. This is less than 1.5 mm in diameter.  Left circumflex coronary artery distributes normally. It gives rise to 3 marginal branches. In the midvessel, there is diffuse 40% disease in a segmental fashion.  The right coronary arises and distributes normally. There is segmental 30% disease in the midvessel.  The left ventricular angiography performed in the RAO view demonstrates normal left ventricular size and contractility with normal ejection fraction of 60%.  FINAL IMPRESSION: 1. Single-vessel obstructive coronary artery disease with a  modest  lesion involving a small diagonal branch. The stents in the  proximal and mid to distal left anterior descending are still  patent. Compared to her prior cardiac  catheterization, there is no significant change 2. Normal left ventricular function.  Assessment and Plan:  1. Symptomatically stable CAD status post DES to the LAD and BMS to the LAD in 2008, both patent at angiography in 2012. She does not report any accelerating angina on current medical regimen. No changes were made today. Continue observation.  2. Hyperlipidemia, on Lipitor. LDL was well controlled as per lab work from PCP last year.   3. Essential hypertension, blood pressure is well controlled today. No changes were made.  4. [facility] in [facility]. Records reviewed. Overall reassuring findings. She does not endorse any progressive symptoms or syncope. Keep follow-up with PCP.  Current medicines were reviewed with the patient today.  Disposition: FU with me in 6 months.   Signed, Jonelle SidleSamuel G. Kynslee Baham, MD, [facility]burgFACC 09/12/2015 10:20 AM    [facility] [address], [place], [place] [address] Phone: [phone]) [phone]; Fax: [phone]

## 2015-09-19 ENCOUNTER — Ambulatory Visit: Payer: Commercial Managed Care - HMO | Admitting: Family Medicine

## 2015-09-20 ENCOUNTER — Encounter: Payer: Self-pay | Admitting: Family

## 2015-10-03 ENCOUNTER — Ambulatory Visit (INDEPENDENT_AMBULATORY_CARE_PROVIDER_SITE_OTHER): Payer: Commercial Managed Care - HMO | Admitting: Urology

## 2015-10-03 DIAGNOSIS — N3946 Mixed incontinence: Secondary | ICD-10-CM

## 2015-10-03 DIAGNOSIS — N3281 Overactive bladder: Secondary | ICD-10-CM

## 2015-10-03 DIAGNOSIS — R3981 Functional urinary incontinence: Secondary | ICD-10-CM | POA: Diagnosis not present

## 2015-10-08 ENCOUNTER — Other Ambulatory Visit: Payer: Self-pay | Admitting: Family

## 2015-11-01 ENCOUNTER — Encounter: Payer: Self-pay | Admitting: Family

## 2015-11-01 ENCOUNTER — Ambulatory Visit (INDEPENDENT_AMBULATORY_CARE_PROVIDER_SITE_OTHER): Payer: Commercial Managed Care - HMO | Admitting: Family

## 2015-11-01 ENCOUNTER — Other Ambulatory Visit: Payer: Self-pay | Admitting: Family

## 2015-11-01 VITALS — BP 138/72 | HR 80 | Temp 97.2°F | Ht 60.0 in | Wt 151.0 lb

## 2015-11-01 DIAGNOSIS — J309 Allergic rhinitis, unspecified: Secondary | ICD-10-CM

## 2015-11-01 DIAGNOSIS — Z87440 Personal history of urinary (tract) infections: Secondary | ICD-10-CM

## 2015-11-01 DIAGNOSIS — N3001 Acute cystitis with hematuria: Secondary | ICD-10-CM

## 2015-11-01 LAB — POCT URINALYSIS DIPSTICK
Bilirubin, UA: NEGATIVE
Glucose, UA: NEGATIVE
Ketones, UA: NEGATIVE
NITRITE UA: NEGATIVE
PROTEIN UA: NEGATIVE
UROBILINOGEN UA: NEGATIVE
pH, UA: 5

## 2015-11-01 LAB — POCT UA - MICROSCOPIC ONLY
Casts, Ur, LPF, POC: NEGATIVE
Crystals, Ur, HPF, POC: NEGATIVE
YEAST UA: NEGATIVE

## 2015-11-01 MED ORDER — FLUTICASONE PROPIONATE 50 MCG/ACT NA SUSP: 2.0000 | Freq: Every day | NASAL | Status: DC

## 2015-11-01 MED ORDER — LEVOFLOXACIN 500 MG PO TABS: 500.0000 mg | ORAL_TABLET | Freq: Every day | ORAL | Status: DC

## 2015-11-01 MED ORDER — CETIRIZINE HCL 10 MG PO TABS: 10.0000 mg | ORAL_TABLET | Freq: Every day | ORAL | Status: DC

## 2015-11-01 NOTE — Patient Instructions (Signed)
Allergic Rhinitis Allergic rhinitis is when the mucous membranes in the nose respond to allergens. Allergens are particles in the air that cause your body to have an allergic reaction. This causes you to release allergic antibodies. Through a chain of events, these eventually cause you to release histamine into the blood stream. Although meant to protect the body, it is this release of histamine that causes your discomfort, such as frequent sneezing, congestion, and an itchy, runny nose.  CAUSES Seasonal allergic rhinitis (hay fever) is caused by pollen allergens that may come from grasses, trees, and weeds. Year-round allergic rhinitis (perennial allergic rhinitis) is caused by allergens such as house dust mites, pet dander, and mold spores. SYMPTOMS  Nasal stuffiness (congestion).  Itchy, runny nose with sneezing and tearing of the eyes. DIAGNOSIS Your health care provider can help you determine the allergen or allergens that trigger your symptoms. If you and your health care provider are unable to determine the allergen, skin or blood testing may be used. Your health care provider will diagnose your condition after taking your health history and performing a physical exam. Your health care provider may assess you for other related conditions, such as asthma, pink eye, or an ear infection. TREATMENT Allergic rhinitis does not have a cure, but it can be controlled by:  Medicines that block allergy symptoms. These may include allergy shots, nasal sprays, and oral antihistamines.  Avoiding the allergen. Hay fever may often be treated with antihistamines in pill or nasal spray forms. Antihistamines block the effects of histamine. There are over-the-counter medicines that may help with nasal congestion and swelling around the eyes. Check with your health care provider before taking or giving this medicine. If avoiding the allergen or the medicine prescribed do not work, there are many new medicines  your health care provider can prescribe. Stronger medicine may be used if initial measures are ineffective. Desensitizing injections can be used if medicine and avoidance does not work. Desensitization is when a patient is given ongoing shots until the body becomes less sensitive to the allergen. Make sure you follow up with your health care provider if problems continue. HOME CARE INSTRUCTIONS It is not possible to completely avoid allergens, but you can reduce your symptoms by taking steps to limit your exposure to them. It helps to know exactly what you are allergic to so that you can avoid your specific triggers. SEEK MEDICAL CARE IF:  You have a fever.  You develop a cough that does not stop easily (persistent).  You have shortness of breath.  You start wheezing.  Symptoms interfere with normal daily activities.   This information is not intended to replace advice given to you by your health care provider. Make sure you discuss any questions you have with your health care provider.   Document Released: 05/13/2001 Document Revised: 09/08/2014 Document Reviewed: 04/25/2013 Elsevier Interactive Patient Education 2016 Elsevier Inc.  

## 2015-11-01 NOTE — Addendum Note (Signed)
Addended by: Orma Render F on: 11/01/2015 02:11 PM   Modules accepted: Orders

## 2015-11-01 NOTE — Progress Notes (Signed)
   Subjective:    Patient ID: [name], female    DOB: [birth year], 82 y.o.   MRN: [id number]  Cough This is a new problem. The current episode started in the past 7 days. The problem has been waxing and waning. The problem occurs every few minutes. The cough is productive of sputum. Associated symptoms include chills, postnasal drip, rhinorrhea, shortness of breath and wheezing. Pertinent negatives include no ear congestion, ear pain, fever, headaches or nasal congestion. The symptoms are aggravated by lying down. She has tried OTC cough suppressant and rest for the symptoms. The treatment provided mild relief. There is no history of asthma or COPD.      Review of Systems  Constitutional: Positive for chills. Negative for fever.  HENT: Positive for postnasal drip and rhinorrhea. Negative for ear pain.   Eyes: Negative.   Respiratory: Positive for cough, shortness of breath and wheezing.   Cardiovascular: Negative.  Negative for palpitations.  Gastrointestinal: Negative.   Endocrine: Negative.   Genitourinary: Negative.   Musculoskeletal: Negative.   Neurological: Negative.  Negative for headaches.  Hematological: Negative.   Psychiatric/Behavioral: Negative.   All other systems reviewed and are negative.      Objective:   Physical Exam  Constitutional: She is oriented to person, place, and time. She appears well-developed and well-nourished. No distress.  HENT:  Head: Normocephalic and atraumatic.  Right Ear: External ear normal.  Left Ear: External ear normal.  Nasal passage erythemas with mild swelling  Oropharynx erythemas  Eyes: Pupils are equal, round, and reactive to light.  Neck: Normal range of motion. Neck supple. No thyromegaly present.  Cardiovascular: Normal rate, regular rhythm, normal heart sounds and intact distal pulses.   No murmur heard. Pulmonary/Chest: Effort normal and breath sounds normal. No respiratory distress. She has no wheezes.  Abdominal:  Soft. Bowel sounds are normal. She exhibits no distension. There is no tenderness.  Musculoskeletal: Normal range of motion. She exhibits no edema or tenderness.  Neurological: She is alert and oriented to person, place, and time. She has normal reflexes. No cranial nerve deficit.  Skin: Skin is warm and dry.  Psychiatric: She has a normal mood and affect. Her behavior is normal. Judgment and thought content normal.  Vitals reviewed.     BP 138/72 mmHg  Pulse 80  Temp(Src) 97.2 F (36.2 C) (Oral)  Ht 5' (1.524 m)  Wt 151 lb (68.493 kg)  BMI 29.49 kg/m2     Assessment & Plan:  1. Hx of bladder infections - POCT urinalysis dipstick - POCT UA - Microscopic Only -keep all Urologists appts!!!  2. Allergic rhinitis, unspecified allergic rhinitis type -Avoid allergens when possible _PT told to start zyrtec daily and to continue Flonase every night -RTO prn  - fluticasone (FLONASE) 50 MCG/ACT nasal spray; Place 2 sprays into both nostrils daily.  Dispense: 16 g; Refill: 6 - cetirizine (ZYRTEC) 10 MG tablet; Take 1 tablet (10 mg total) by mouth daily.  Dispense: 30 tablet; Refill: 11  Jannifer Rodney, FNP

## 2015-11-05 LAB — URINE CULTURE

## 2015-11-29 ENCOUNTER — Ambulatory Visit (INDEPENDENT_AMBULATORY_CARE_PROVIDER_SITE_OTHER): Payer: Commercial Managed Care - HMO | Admitting: Family

## 2015-11-29 ENCOUNTER — Encounter: Payer: Self-pay | Admitting: Family

## 2015-11-29 VITALS — BP 155/68 | HR 70 | Temp 97.8°F | Ht 60.0 in | Wt 154.2 lb

## 2015-11-29 DIAGNOSIS — J069 Acute upper respiratory infection, unspecified: Secondary | ICD-10-CM

## 2015-11-29 DIAGNOSIS — R319 Hematuria, unspecified: Secondary | ICD-10-CM | POA: Diagnosis not present

## 2015-11-29 DIAGNOSIS — R6889 Other general symptoms and signs: Secondary | ICD-10-CM

## 2015-11-29 DIAGNOSIS — Z87440 Personal history of urinary (tract) infections: Secondary | ICD-10-CM

## 2015-11-29 LAB — URINALYSIS, COMPLETE
Bilirubin, UA: NEGATIVE
GLUCOSE, UA: NEGATIVE
Ketones, UA: NEGATIVE
NITRITE UA: NEGATIVE
PROTEIN UA: NEGATIVE
SPEC GRAV UA: 1.015 (ref 1.005–1.030)
Urobilinogen, Ur: 0.2 mg/dL (ref 0.2–1.0)
pH, UA: 5.5 (ref 5.0–7.5)

## 2015-11-29 LAB — VERITOR FLU A/B WAIVED
INFLUENZA A: NEGATIVE
INFLUENZA B: NEGATIVE

## 2015-11-29 LAB — MICROSCOPIC EXAMINATION

## 2015-11-29 MED ORDER — METHYLPREDNISOLONE ACETATE 80 MG/ML IJ SUSP
40.0000 mg | Freq: Once | INTRAMUSCULAR | Status: AC
  Administered 2015-11-29: 40 mg via INTRAMUSCULAR

## 2015-11-29 NOTE — Patient Instructions (Addendum)
Allergic Rhinitis Allergic rhinitis is when the mucous membranes in the nose respond to allergens. Allergens are particles in the air that cause your body to have an allergic reaction. This causes you to release allergic antibodies. Through a chain of events, these eventually cause you to release histamine into the blood stream. Although meant to protect the body, it is this release of histamine that causes your discomfort, such as frequent sneezing, congestion, and an itchy, runny nose.  CAUSES Seasonal allergic rhinitis (hay fever) is caused by pollen allergens that may come from grasses, trees, and weeds. Year-round allergic rhinitis (perennial allergic rhinitis) is caused by allergens such as house dust mites, pet dander, and mold spores. SYMPTOMS  Nasal stuffiness (congestion).  Itchy, runny nose with sneezing and tearing of the eyes. DIAGNOSIS Your health care provider can help you determine the allergen or allergens that trigger your symptoms. If you and your health care provider are unable to determine the allergen, skin or blood testing may be used. Your health care provider will diagnose your condition after taking your health history and performing a physical exam. Your health care provider may assess you for other related conditions, such as asthma, pink eye, or an ear infection. TREATMENT Allergic rhinitis does not have a cure, but it can be controlled by:  Medicines that block allergy symptoms. These may include allergy shots, nasal sprays, and oral antihistamines.  Avoiding the allergen. Hay fever may often be treated with antihistamines in pill or nasal spray forms. Antihistamines block the effects of histamine. There are over-the-counter medicines that may help with nasal congestion and swelling around the eyes. Check with your health care provider before taking or giving this medicine. If avoiding the allergen or the medicine prescribed do not work, there are many new medicines  your health care provider can prescribe. Stronger medicine may be used if initial measures are ineffective. Desensitizing injections can be used if medicine and avoidance does not work. Desensitization is when a patient is given ongoing shots until the body becomes less sensitive to the allergen. Make sure you follow up with your health care provider if problems continue. HOME CARE INSTRUCTIONS It is not possible to completely avoid allergens, but you can reduce your symptoms by taking steps to limit your exposure to them. It helps to know exactly what you are allergic to so that you can avoid your specific triggers. SEEK MEDICAL CARE IF:  You have a fever.  You develop a cough that does not stop easily (persistent).  You have shortness of breath.  You start wheezing.  Symptoms interfere with normal daily activities.   This information is not intended to replace advice given to you by your health care provider. Make sure you discuss any questions you have with your health care provider.   Document Released: 05/13/2001 Document Revised: 09/08/2014 Document Reviewed: 04/25/2013 Elsevier Interactive Patient Education 2016 [employer] Inc[employer] Upper Respiratory Infection, Adult Most upper respiratory infections (URIs) are a viral infection of the air passages leading to the lungs. A URI affects the nose, throat, and upper air passages. The most common type of URI is nasopharyngitis and is typically referred to as "the common cold." URIs run their course and usually go away on their own. Most of the time, a URI does not require medical attention, but sometimes a bacterial infection in the upper airways can follow a viral infection. This is called a secondary infection. Sinus and middle ear infections are common types of secondary upper respiratory infections.  Bacterial pneumonia can also complicate a URI. A URI can worsen asthma and chronic obstructive pulmonary disease (COPD). Sometimes, these  complications can require emergency medical care and may be life threatening.  CAUSES Almost all URIs are caused by viruses. A virus is a type of germ and can spread from one person to another.  RISKS FACTORS You may be at risk for a URI if:   You smoke.   You have chronic heart or lung disease.  You have a weakened defense (immune) system.   You are very young or very old.   You have nasal allergies or asthma.  You work in crowded or poorly ventilated areas.  You work in health care facilities or schools. SIGNS AND SYMPTOMS  Symptoms typically develop 2-3 days after you come in contact with a cold virus. Most viral URIs last 7-10 days. However, viral URIs from the influenza virus (flu virus) can last 14-18 days and are typically more severe. Symptoms may include:   Runny or stuffy (congested) nose.   Sneezing.   Cough.   Sore throat.   Headache.   Fatigue.   Fever.   Loss of appetite.   Pain in your forehead, behind your eyes, and over your cheekbones (sinus pain).  Muscle aches.  DIAGNOSIS  Your health care provider may diagnose a URI by:  Physical exam.  Tests to check that your symptoms are not due to another condition such as:  Strep throat.  Sinusitis.  Pneumonia.  Asthma. TREATMENT  A URI goes away on its own with time. It cannot be cured with medicines, but medicines may be prescribed or recommended to relieve symptoms. Medicines may help:  Reduce your fever.  Reduce your cough.  Relieve nasal congestion. HOME CARE INSTRUCTIONS   Take medicines only as directed by your health care provider.   Gargle warm saltwater or take cough drops to comfort your throat as directed by your health care provider.  Use a warm mist humidifier or inhale steam from a shower to increase air moisture. This may make it easier to breathe.  Drink enough fluid to keep your urine clear or pale yellow.   Eat soups and other clear broths and maintain  good nutrition.   Rest as needed.   Return to work when your temperature has returned to normal or as your health care provider advises. You may need to stay home longer to avoid infecting others. You can also use a face mask and careful hand washing to prevent spread of the virus.  Increase the usage of your inhaler if you have asthma.   Do not use any tobacco products, including cigarettes, chewing tobacco, or electronic cigarettes. If you need help quitting, ask your health care provider. PREVENTION  The best way to protect yourself from getting a cold is to practice good hygiene.   Avoid oral or hand contact with people with cold symptoms.   Wash your hands often if contact occurs.  There is no clear evidence that vitamin C, vitamin E, echinacea, or exercise reduces the chance of developing a cold. However, it is always recommended to get plenty of rest, exercise, and practice good nutrition.  SEEK MEDICAL CARE IF:   You are getting worse rather than better.   Your symptoms are not controlled by medicine.   You have chills.  You have worsening shortness of breath.  You have brown or red mucus.  You have yellow or brown nasal discharge.  You have pain in your  face, especially when you bend forward.  You have a fever.  You have swollen neck glands.  You have pain while swallowing.  You have white areas in the back of your throat. SEEK IMMEDIATE MEDICAL CARE IF:   You have severe or persistent:  Headache.  Ear pain.  Sinus pain.  Chest pain.  You have chronic lung disease and any of the following:  Wheezing.  Prolonged cough.  Coughing up blood.  A change in your usual mucus.  You have a stiff neck.  You have changes in your:  Vision.  Hearing.  Thinking.  Mood. MAKE SURE YOU:   Understand these instructions.  Will watch your condition.  Will get help right away if you are not doing well or get worse.   This information is not  intended to replace advice given to you by your health care provider. Make sure you discuss any questions you have with your health care provider.   Document Released: 02/11/2001 Document Revised: 01/02/2015 Document Reviewed: 11/23/2013 Elsevier Interactive Patient Education 2016 [employer]  - Take meds as prescribed - Use a cool mist humidifier  -Use saline nose sprays frequently -Saline irrigations of the nose can be very helpful if done frequently.  * 4X daily for 1 week*  * Use of a nettie pot can be helpful with this. Follow directions with this* -Force fluids -For any cough or congestion  Use plain Mucinex- regular strength or max strength is fine   * Children- consult with Pharmacist for dosing -For fever or aces or pains- take tylenol or ibuprofen appropriate for age and weight.  * for fevers greater than 101 orally you may alternate ibuprofen and tylenol every  3 hours. -Throat lozenges if help   Jannifer Rodneyhristy Meylin Stenzel, FNP

## 2015-11-29 NOTE — Progress Notes (Signed)
Subjective:    Patient ID: [name], female    DOB: 1931-04-04, 82 y.o.   MRN: [id number]  Cough This is a new problem. The current episode started in the past 7 days. The problem has been unchanged. The problem occurs every few minutes. The cough is productive of sputum. Associated symptoms include chills, a fever, myalgias, nasal congestion, postnasal drip, rhinorrhea, a sore throat and wheezing. Pertinent negatives include no ear congestion, ear pain, headaches or shortness of breath. The symptoms are aggravated by lying down. She has tried OTC cough suppressant and rest for the symptoms. The treatment provided mild relief. Her past medical history is significant for environmental allergies.      Review of Systems  Constitutional: Positive for fever and chills.  HENT: Positive for postnasal drip, rhinorrhea and sore throat. Negative for ear pain.   Eyes: Negative.   Respiratory: Positive for cough and wheezing. Negative for shortness of breath.   Cardiovascular: Negative.  Negative for palpitations.  Gastrointestinal: Negative.   Endocrine: Negative.   Genitourinary: Negative.   Musculoskeletal: Positive for myalgias.  Allergic/Immunologic: Positive for environmental allergies.  Neurological: Negative.  Negative for headaches.  Hematological: Negative.   Psychiatric/Behavioral: Negative.   All other systems reviewed and are negative.      Objective:   Physical Exam  Constitutional: She is oriented to person, place, and time. She appears well-developed and well-nourished. No distress.  HENT:  Head: Normocephalic and atraumatic.  Right Ear: External ear normal.  Left Ear: External ear normal.  Nasal passage erythemas with mild swelling    Eyes: Pupils are equal, round, and reactive to light.  Neck: Normal range of motion. Neck supple. No thyromegaly present.  Cardiovascular: Normal rate, regular rhythm, normal heart sounds and intact distal pulses.   No murmur  heard. Pulmonary/Chest: Effort normal and breath sounds normal. No respiratory distress. She has no wheezes.  Abdominal: Soft. Bowel sounds are normal. She exhibits no distension. There is no tenderness.  Musculoskeletal: Normal range of motion. She exhibits no edema or tenderness.  Negative for CVA tenderness   Neurological: She is alert and oriented to person, place, and time. No cranial nerve deficit.  Skin: Skin is warm and dry.  Psychiatric: She has a normal mood and affect. Her behavior is normal. Judgment and thought content normal.  Vitals reviewed.     BP 155/68 mmHg  Pulse 70  Temp(Src) 97.8 F (36.6 C) (Oral)  Ht 5' (1.524 m)  Wt 154 lb 3.2 oz (69.945 kg)  BMI 30.12 kg/m2     Assessment & Plan:  1. Flu-like symptoms - Veritor Flu A/B Waived - methylPREDNISolone acetate (DEPO-MEDROL) injection 40 mg; Inject 0.5 mLs (40 mg total) into the muscle once.  2. History of UTI -Keep appt with Urologists  Urine culture pending - Urinalysis, Complete - Urine culture  3. Acute upper respiratory infection .- Take meds as prescribed - Use a cool mist humidifier  -Use saline nose sprays frequently -Saline irrigations of the nose can be very helpful if done frequently.  * 4X daily for 1 week*  * Use of a nettie pot can be helpful with this. Follow directions with this* -Force fluids -For any cough or congestion  Use plain Mucinex- regular strength or max strength is fine   * Children- consult with Pharmacist for dosing -For fever or aces or pains- take tylenol or ibuprofen appropriate for age and weight.  * for fevers greater than 101 orally you may alternate ibuprofen  and tylenol every  3 hours. -Throat lozenges if help -New toothbrush in 3 days - methylPREDNISolone acetate (DEPO-MEDROL) injection 40 mg; Inject 0.5 mLs (40 mg total) into the muscle once.  4. Hematuria -Keep Urologists appt - Urine culture  Jannifer Rodneyhristy Chassidy Layson, FNP

## 2015-12-01 LAB — URINE CULTURE

## 2015-12-03 ENCOUNTER — Other Ambulatory Visit: Payer: Self-pay | Admitting: Family

## 2015-12-03 MED ORDER — AMOXICILLIN 875 MG PO TABS: 875.0000 mg | ORAL_TABLET | Freq: Two times a day (BID) | ORAL | Status: DC

## 2015-12-06 ENCOUNTER — Other Ambulatory Visit: Payer: Self-pay | Admitting: Family

## 2015-12-06 NOTE — Telephone Encounter (Signed)
Last lipid 11/09/14  [name]

## 2016-01-02 ENCOUNTER — Ambulatory Visit (INDEPENDENT_AMBULATORY_CARE_PROVIDER_SITE_OTHER): Payer: Commercial Managed Care - HMO | Admitting: Urology

## 2016-01-02 DIAGNOSIS — N3946 Mixed incontinence: Secondary | ICD-10-CM | POA: Diagnosis not present

## 2016-01-02 DIAGNOSIS — N390 Urinary tract infection, site not specified: Secondary | ICD-10-CM | POA: Diagnosis not present

## 2016-01-06 DIAGNOSIS — R300 Dysuria: Secondary | ICD-10-CM | POA: Diagnosis not present

## 2016-01-06 DIAGNOSIS — Z7902 Long term (current) use of antithrombotics/antiplatelets: Secondary | ICD-10-CM | POA: Diagnosis not present

## 2016-01-06 DIAGNOSIS — I10 Essential (primary) hypertension: Secondary | ICD-10-CM | POA: Diagnosis not present

## 2016-01-06 DIAGNOSIS — E7800 Pure hypercholesterolemia, unspecified: Secondary | ICD-10-CM | POA: Diagnosis not present

## 2016-01-06 DIAGNOSIS — R319 Hematuria, unspecified: Secondary | ICD-10-CM | POA: Diagnosis not present

## 2016-01-06 DIAGNOSIS — Z87440 Personal history of urinary (tract) infections: Secondary | ICD-10-CM | POA: Diagnosis not present

## 2016-01-06 DIAGNOSIS — K219 Gastro-esophageal reflux disease without esophagitis: Secondary | ICD-10-CM | POA: Diagnosis not present

## 2016-01-06 DIAGNOSIS — N390 Urinary tract infection, site not specified: Secondary | ICD-10-CM | POA: Diagnosis not present

## 2016-01-06 DIAGNOSIS — Z79899 Other long term (current) drug therapy: Secondary | ICD-10-CM | POA: Diagnosis not present

## 2016-01-06 DIAGNOSIS — I252 Old myocardial infarction: Secondary | ICD-10-CM | POA: Diagnosis not present

## 2016-01-07 ENCOUNTER — Other Ambulatory Visit: Payer: Self-pay | Admitting: Family

## 2016-01-25 ENCOUNTER — Ambulatory Visit (INDEPENDENT_AMBULATORY_CARE_PROVIDER_SITE_OTHER): Payer: Commercial Managed Care - HMO | Admitting: Urology

## 2016-01-25 DIAGNOSIS — N952 Postmenopausal atrophic vaginitis: Secondary | ICD-10-CM | POA: Diagnosis not present

## 2016-01-25 DIAGNOSIS — N390 Urinary tract infection, site not specified: Secondary | ICD-10-CM

## 2016-01-25 DIAGNOSIS — N3946 Mixed incontinence: Secondary | ICD-10-CM | POA: Diagnosis not present

## 2016-02-11 ENCOUNTER — Other Ambulatory Visit: Payer: Self-pay | Admitting: *Deleted

## 2016-02-11 MED ORDER — CLOPIDOGREL BISULFATE 75 MG PO TABS: 75.0000 mg | ORAL_TABLET | Freq: Every day | ORAL | Status: DC

## 2016-02-18 ENCOUNTER — Other Ambulatory Visit: Payer: Self-pay | Admitting: *Deleted

## 2016-02-18 MED ORDER — LISINOPRIL 10 MG PO TABS: 10.0000 mg | ORAL_TABLET | Freq: Every day | ORAL | Status: DC

## 2016-02-18 MED ORDER — ISOSORBIDE MONONITRATE ER 30 MG PO TB24: 30.0000 mg | ORAL_TABLET | Freq: Two times a day (BID) | ORAL | Status: DC

## 2016-02-20 ENCOUNTER — Encounter: Payer: Self-pay | Admitting: Physician Assistant

## 2016-02-20 ENCOUNTER — Ambulatory Visit (INDEPENDENT_AMBULATORY_CARE_PROVIDER_SITE_OTHER): Payer: Commercial Managed Care - HMO | Admitting: Physician Assistant

## 2016-02-20 VITALS — BP 154/78 | HR 72 | Temp 97.0°F | Ht 60.0 in | Wt 157.0 lb

## 2016-02-20 DIAGNOSIS — R350 Frequency of micturition: Secondary | ICD-10-CM | POA: Diagnosis not present

## 2016-02-20 LAB — MICROSCOPIC EXAMINATION
Epithelial Cells (non renal): >10 /hpf — AB (ref 0–10)
WBC, UA: >30 /hpf — AB (ref 0–?)

## 2016-02-20 LAB — URINALYSIS, COMPLETE
Bilirubin, UA: NEGATIVE
GLUCOSE, UA: NEGATIVE
KETONES UA: NEGATIVE
Nitrite, UA: NEGATIVE
PH UA: 5.5 (ref 5.0–7.5)
Specific Gravity, UA: >1.03 — ABNORMAL HIGH (ref 1.005–1.030)
Urobilinogen, Ur: 0.2 mg/dL (ref 0.2–1.0)

## 2016-02-20 MED ORDER — FLUCONAZOLE 150 MG PO TABS: 150.0000 mg | ORAL_TABLET | Freq: Once | ORAL | Status: DC

## 2016-02-20 MED ORDER — AMOXICILLIN 500 MG PO CAPS: 500.0000 mg | ORAL_CAPSULE | Freq: Two times a day (BID) | ORAL | Status: DC

## 2016-02-20 NOTE — Progress Notes (Signed)
Subjective:     Patient ID: Kela MillinKatherine Xia, female   DOB: 10/20/1930, 82 y.o.   MRN: [id number]  HPI Pt with a long hx of UTI Currently sees Dr Annabell HowellsWrenn for same Now with lower abd pain and dysuria Hx complicated by the fact of multiple drug allergies Pt has f/u with Dr Annabell HowellsWrenn in 6 weeks  Review of Systems  Constitutional: Negative.   HENT: Negative.   Respiratory: Negative.   Cardiovascular: Negative.   Genitourinary: Positive for dysuria, urgency, frequency and difficulty urinating. Negative for hematuria.       Objective:   Physical Exam  Constitutional: She appears well-developed and well-nourished.  Cardiovascular: Normal rate, regular rhythm and normal heart sounds.   Pulmonary/Chest: Effort normal and breath sounds normal.  Abdominal: Soft. She exhibits no distension and no mass. There is tenderness. There is no rebound and no guarding.  Generalized TTP No CVAT  Nursing note and vitals reviewed. UA and urine cult -see labs     Assessment:     1. Urinary frequency        Plan:     Fluids Keep f/u with Urol Pt with multiple med allergy and only able tot tolerate Amox Diflucan at the ned of course Will inform of lab results F/U pending labs

## 2016-02-20 NOTE — Patient Instructions (Signed)

## 2016-02-22 LAB — URINE CULTURE

## 2016-03-05 ENCOUNTER — Other Ambulatory Visit: Payer: Self-pay | Admitting: Family

## 2016-03-07 ENCOUNTER — Ambulatory Visit (INDEPENDENT_AMBULATORY_CARE_PROVIDER_SITE_OTHER): Payer: Commercial Managed Care - HMO | Admitting: Physician Assistant

## 2016-03-07 ENCOUNTER — Encounter: Payer: Self-pay | Admitting: Physician Assistant

## 2016-03-07 VITALS — BP 140/64 | HR 67 | Temp 98.1°F | Ht 60.0 in | Wt 158.2 lb

## 2016-03-07 DIAGNOSIS — R5383 Other fatigue: Secondary | ICD-10-CM | POA: Diagnosis not present

## 2016-03-07 DIAGNOSIS — R52 Pain, unspecified: Secondary | ICD-10-CM | POA: Diagnosis not present

## 2016-03-07 DIAGNOSIS — R300 Dysuria: Secondary | ICD-10-CM

## 2016-03-07 LAB — URINALYSIS, COMPLETE
Bilirubin, UA: NEGATIVE
GLUCOSE, UA: NEGATIVE
KETONES UA: NEGATIVE
NITRITE UA: NEGATIVE
PROTEIN UA: NEGATIVE
SPEC GRAV UA: 1.015 (ref 1.005–1.030)
Urobilinogen, Ur: 0.2 mg/dL (ref 0.2–1.0)
pH, UA: 5.5 (ref 5.0–7.5)

## 2016-03-07 LAB — MICROSCOPIC EXAMINATION

## 2016-03-07 NOTE — Patient Instructions (Signed)
Fatigue  Fatigue is feeling tired all of the time, a lack of energy, or a lack of motivation. Occasional or mild fatigue is often a normal response to activity or life in general. However, long-lasting (chronic) or extreme fatigue may indicate an underlying medical condition.  HOME CARE INSTRUCTIONS   Watch your fatigue for any changes. The following actions may help to lessen any discomfort you are feeling:  · Talk to your health care provider about how much sleep you need each night. Try to get the required amount every night.  · Take medicines only as directed by your health care provider.  · Eat a healthy and nutritious diet. Ask your health care provider if you need help changing your diet.  · Drink enough fluid to keep your urine clear or pale yellow.  · Practice ways of relaxing, such as yoga, meditation, massage therapy, or acupuncture.  · Exercise regularly.    · Change situations that cause you stress. Try to keep your work and personal routine reasonable.  · Do not abuse illegal drugs.  · Limit alcohol intake to no more than 1 drink per day for nonpregnant women and 2 drinks per day for men. One drink equals 12 ounces of beer, 5 ounces of wine, or 1½ ounces of hard liquor.  · Take a multivitamin, if directed by your health care provider.  SEEK MEDICAL CARE IF:   · Your fatigue does not get better.  · You have a fever.    · You have unintentional weight loss or gain.  · You have headaches.    · You have difficulty:      Falling asleep.    Sleeping throughout the night.  · You feel angry, guilty, anxious, or sad.     · You are unable to have a bowel movement (constipation).    · You skin is dry.     · Your legs or another part of your body is swollen.    SEEK IMMEDIATE MEDICAL CARE IF:   · You feel confused.    · Your vision is blurry.  · You feel faint or pass out.    · You have a severe headache.    · You have severe abdominal, pelvic, or back pain.    · You have chest pain, shortness of breath, or an  irregular or fast heartbeat.    · You are unable to urinate or you urinate less than normal.    · You develop abnormal bleeding, such as bleeding from the rectum, vagina, nose, lungs, or nipples.  · You vomit blood.     · You have thoughts about harming yourself or committing suicide.    · You are worried that you might harm someone else.       This information is not intended to replace advice given to you by your health care provider. Make sure you discuss any questions you have with your health care provider.     Document Released: 06/15/2007 Document Revised: 09/08/2014 Document Reviewed: 12/20/2013  Elsevier Interactive Patient Education ©2016 [employer] Inc[employer]

## 2016-03-07 NOTE — Progress Notes (Signed)
Subjective:     Patient ID: [name], female   DOB: 01-13-1931, 82 y.o.   MRN: [id number]  HPI Pt seen with continued dysuria and fatigue Daughter with pt today States her mother is cold all of the time and actually had the heat on despite it being 3190 outside She finished course of ATB for dysuria 2 weeks ago When questioned she states her pain is more to the outside and has similar sx when cleansing herself  Review of Systems  Constitutional: Positive for activity change and fatigue. Negative for fever, chills, appetite change and unexpected weight change.  Respiratory: Negative.   Cardiovascular: Negative.   Gastrointestinal: Negative.   Genitourinary: Positive for dysuria. Negative for urgency, frequency, hematuria, flank pain, decreased urine volume, difficulty urinating and pelvic pain.       Objective:   Physical Exam  Constitutional: She appears well-developed and well-nourished.  HENT:  Mouth/Throat: Oropharynx is clear and moist.  Neck: Neck supple. No JVD present. No thyromegaly present.  Cardiovascular: Normal rate, regular rhythm, normal heart sounds and intact distal pulses.   Pulmonary/Chest: Effort normal and breath sounds normal.  Lymphadenopathy:    She has no cervical adenopathy.  Nursing note and vitals reviewed. CMP,CBC, TSH pending UA- see labs     Assessment:     1. Dysuria   2. Other fatigue        Plan:     Given current fatigue and feeling cold all of the time will go ahead with basic labs Hx of borderline anemia last year Most of her dysuria sounds like vaginal irritation Discussed this may be coming from wet pads/diapers Family and pt will try to change more freq and may try to go periods w/o use F/U pending labs

## 2016-03-08 LAB — CBC WITH DIFFERENTIAL/PLATELET
BASOS ABS: 0.1 10*3/uL (ref 0.0–0.2)
Basos: 1 %
EOS (ABSOLUTE): 0.1 10*3/uL (ref 0.0–0.4)
EOS: 2 %
HEMATOCRIT: 38.3 % (ref 34.0–46.6)
HEMOGLOBIN: 12.3 g/dL (ref 11.1–15.9)
Immature Grans (Abs): 0 10*3/uL (ref 0.0–0.1)
Immature Granulocytes: 0 %
LYMPHS ABS: 1.9 10*3/uL (ref 0.7–3.1)
Lymphs: 28 %
MCH: 30.2 pg (ref 26.6–33.0)
MCHC: 32.1 g/dL (ref 31.5–35.7)
MCV: 94 fL (ref 79–97)
MONOCYTES: 10 %
MONOS ABS: 0.6 10*3/uL (ref 0.1–0.9)
NEUTROS ABS: 3.9 10*3/uL (ref 1.4–7.0)
Neutrophils: 59 %
Platelets: 254 10*3/uL (ref 150–379)
RBC: 4.07 x10E6/uL (ref 3.77–5.28)
RDW: 14.4 % (ref 12.3–15.4)
WBC: 6.6 10*3/uL (ref 3.4–10.8)

## 2016-03-08 LAB — CMP14+EGFR
ALK PHOS: 59 IU/L (ref 39–117)
ALT: 7 IU/L (ref 0–32)
AST: 13 IU/L (ref 0–40)
Albumin/Globulin Ratio: 1.7 (ref 1.2–2.2)
Albumin: 4.3 g/dL (ref 3.5–4.7)
BUN / CREAT RATIO: 14 (ref 12–28)
BUN: 13 mg/dL (ref 8–27)
Bilirubin Total: 0.9 mg/dL (ref 0.0–1.2)
CHLORIDE: 101 mmol/L (ref 96–106)
CO2: 21 mmol/L (ref 18–29)
Calcium: 9.6 mg/dL (ref 8.7–10.3)
Creatinine, Ser: 0.95 mg/dL (ref 0.57–1.00)
GFR calc non Af Amer: 56 mL/min/{1.73_m2} — ABNORMAL LOW (ref >59)
GFR, EST AFRICAN AMERICAN: 64 mL/min/{1.73_m2} (ref >59)
GLUCOSE: 97 mg/dL (ref 65–99)
Globulin, Total: 2.5 g/dL (ref 1.5–4.5)
Potassium: 4.7 mmol/L (ref 3.5–5.2)
SODIUM: 141 mmol/L (ref 134–144)
Total Protein: 6.8 g/dL (ref 6.0–8.5)

## 2016-03-08 LAB — TSH: TSH: 2.32 u[IU]/mL (ref 0.450–4.500)

## 2016-03-12 ENCOUNTER — Telehealth: Payer: Self-pay | Admitting: Family

## 2016-03-12 NOTE — Telephone Encounter (Signed)
Pt's son aware of lab results.  

## 2016-03-26 NOTE — Progress Notes (Signed)
Cardiology Office Note  Date: 03/27/2016   ID: [name], DOB 1930/10/18, MRN [id number]  PCP: Jannifer Rodney, FNP  Primary Cardiologist: Nona Dell, MD   Chief Complaint  Patient presents with  . Coronary Artery Disease    History of Present Illness: [name] is an 82 y.o. female last seen in January. She presents for a routine follow-up visit. Reports no angina symptoms or nitroglycerin use. Still lives in her own home, has assistance from his son who lives with her.  We reviewed her medications. Cardiac regimen is stable as outlined below. ECG today shows normal sinus rhythm. We are continuing observation on medical therapy, particularly in light of no progressive angina symptoms.  Still reports trouble with anxiety, also resting tremor. She follows closely with her PCP.  Past Medical History:  Diagnosis Date  . Anxiety   . COPD (chronic obstructive pulmonary disease) (HCC)   . Coronary atherosclerosis of native coronary artery    DES LAD and BMS LAD 8/08  . Esophageal stricture   . Essential hypertension, benign   . GERD (gastroesophageal reflux disease)   . MI (myocardial infarction) (HCC) 2008  . Mixed hyperlipidemia   . Osteoporosis   . Pelvic fracture (HCC) 08/2014  . Resting tremor   . Rickets     Current Outpatient Prescriptions  Medication Sig Dispense Refill  . acetaminophen (TYLENOL) 500 MG tablet Take 500 mg by mouth every 6 (six) hours as needed. pain    . albuterol (PROVENTIL HFA;VENTOLIN HFA) 108 (90 BASE) MCG/ACT inhaler Inhale 2 puffs into the lungs every 6 (six) hours as needed for wheezing or shortness of breath. 1 Inhaler 0  . albuterol (PROVENTIL) (2.5 MG/3ML) 0.083% nebulizer solution Take 3 mLs (2.5 mg total) by nebulization every 6 (six) hours as needed for wheezing or shortness of breath. 150 mL 1  . atorvastatin (LIPITOR) 20 MG tablet TAKE 1 TABLET BY MOUTH DAILY AT 6 PM. 90 tablet 0  . calcium citrate-vitamin D (CITRACAL+D)  315-200 MG-UNIT per tablet Take 2 tablets by mouth daily.    . cetirizine (ZYRTEC) 10 MG tablet Take 1 tablet (10 mg total) by mouth daily. 30 tablet 11  . cholecalciferol (VITAMIN D) 1000 UNITS tablet Take 4,000 Units by mouth daily.    . clopidogrel (PLAVIX) 75 MG tablet Take 1 tablet (75 mg total) by mouth daily. 30 tablet 6  . clotrimazole (LOTRIMIN) 1 % cream Apply 1 application topically 2 (two) times daily. 30 g 1  . ESTRACE VAGINAL 0.1 MG/GM vaginal cream     . fluticasone (FLONASE) 50 MCG/ACT nasal spray Place 2 sprays into both nostrils daily. 16 g 6  . isosorbide mononitrate (IMDUR) 30 MG 24 hr tablet Take 1 tablet (30 mg total) by mouth 2 (two) times daily. 180 tablet 3  . lisinopril (PRINIVIL,ZESTRIL) 10 MG tablet Take 1 tablet (10 mg total) by mouth daily. 90 tablet 3  . LORazepam (ATIVAN) 0.5 MG tablet     . mirabegron ER (MYRBETRIQ) 50 MG TB24 tablet Take 50 mg by mouth daily.    . nitroGLYCERIN (NITROSTAT) 0.4 MG SL tablet Place 1 tablet (0.4 mg total) under the tongue every 5 (five) minutes as needed. 25 tablet 3  . pantoprazole (PROTONIX) 40 MG tablet TAKE 1 TABLET BY MOUTH EVERY DAY AT NOON 30 tablet 4   No current facility-administered medications for this visit.    Allergies:  Azithromycin; Cephalexin; Ciprofloxacin; Codeine; Contrast media [iodinated diagnostic agents]; Latex; Nitrofuran derivatives; Nsaids;  Nutritional supplements; Penicillins; Phenazopyridine; Quinolones; Sulfa antibiotics; Toviaz [fesoterodine fumarate er]; and Trimethoprim   Social History: The patient  reports that she has never smoked. She has never used smokeless tobacco. She reports that she does not drink alcohol or use drugs.   ROS:  Please see the history of present illness. Otherwise, complete review of systems is positive for tremor, anxiety.  All other systems are reviewed and negative.   Physical Exam: VS:  BP 122/72   Pulse 66   Ht 5\' 2"  (1.575 m)   Wt 162 lb (73.5 kg)   SpO2 98%    BMI 29.63 kg/m , BMI Body mass index is 29.63 kg/m.  Wt Readings from Last 3 Encounters:  03/27/16 162 lb (73.5 kg)  03/07/16 158 lb 3.2 oz (71.8 kg)  02/20/16 157 lb (71.2 kg)    Elderly woman in no distress. Uses a cane. HEENT: Conjunctiva and lids normal, oropharynx clear.  Neck: Supple, no elevated jugular venous pressure, no bruits.  Lungs: Clear to auscultation, nonlabored.  Cardiac: Regular rate and rhythm, no S3 gallop.  Abdomen: Soft, nontender, no bruits.  Skin: Warm and dry.  Extremities: No significant peripheral edema   ECG: I personally reviewed the tracing from 06/24/2015 which showed sinus rhythm.  Recent Labwork: 03/07/2016: ALT 7; AST 13; BUN 13; Creatinine, Ser 0.95; Platelets 254; Potassium 4.7; Sodium 141; TSH 2.320     Component Value Date/Time   CHOL 146 11/09/2014 1523   TRIG 88 11/09/2014 1523   HDL 72 11/09/2014 1523   CHOLHDL 2.0 11/09/2014 1523   CHOLHDL 2.1 06/07/2012 0528   VLDL 17 06/07/2012 0528   LDLCALC 56 11/09/2014 1523    Other Studies Reviewed Today:  Cardiac catheterization 06/20/2011: HEMODYNAMIC DATA: Aortic pressure is 139/60 with a mean of 91 mmHg. Left ventricular pressure is 137 with EDP of 10 mmHg.  ANGIOGRAPHIC DATA: The left coronary artery arises and distributes normally. The left main coronary artery is normal.  The left anterior descending artery demonstrates that the proximal and mid to distal vessel stents are widely patent. Between the stents in the mid vessel, there is diffuse 30% narrowing. There is a small first diagonal branch that has a focal 70% stenosis. This is less than 1.5 mm in diameter.  Left circumflex coronary artery distributes normally. It gives rise to 3 marginal branches. In the midvessel, there is diffuse 40% disease in a segmental fashion.  The right coronary arises and distributes normally. There is segmental 30% disease in the midvessel.  The left ventricular  angiography performed in the RAO view demonstrates normal left ventricular size and contractility with normal ejection fraction of 60%.  FINAL IMPRESSION: 1. Single-vessel obstructive coronary artery disease with a modest  lesion involving a small diagonal branch. The stents in the  proximal and mid to distal left anterior descending are still  patent. Compared to her prior cardiac  catheterization, there is no significant change 2. Normal left ventricular function.  Assessment and Plan:  1. Symptomatically stable CAD status post DES and BMS to the LAD in 2008. Vessels were patent at angiography in 2012, she has had no progressive angina symptoms on current medical regimen. Continue observation.  2. Essential hypertension, blood pressure is well controlled today.  3. Hyperlipidemia, continues on Lipitor with good lipid control.  Current medicines were reviewed with the patient today.   Orders Placed This Encounter  Procedures  . EKG 12-Lead    Disposition: Follow up with me in 6 months.  Signed, Jonelle Sidle, MD, [facility] 03/27/2016 3:17 PM    [facility] at [facility] [address] [place], [place], [place] [address] Phone: [phone]; Fax: [phone]

## 2016-03-27 ENCOUNTER — Ambulatory Visit (INDEPENDENT_AMBULATORY_CARE_PROVIDER_SITE_OTHER): Payer: Commercial Managed Care - HMO | Admitting: Cardiology

## 2016-03-27 ENCOUNTER — Encounter: Payer: Self-pay | Admitting: Cardiology

## 2016-03-27 VITALS — BP 122/72 | HR 66 | Ht 62.0 in | Wt 162.0 lb

## 2016-03-27 DIAGNOSIS — E782 Mixed hyperlipidemia: Secondary | ICD-10-CM

## 2016-03-27 DIAGNOSIS — I10 Essential (primary) hypertension: Secondary | ICD-10-CM

## 2016-03-27 DIAGNOSIS — I2510 Atherosclerotic heart disease of native coronary artery without angina pectoris: Secondary | ICD-10-CM | POA: Diagnosis not present

## 2016-03-27 NOTE — Patient Instructions (Signed)

## 2016-04-01 ENCOUNTER — Telehealth: Payer: Self-pay

## 2016-04-01 NOTE — Telephone Encounter (Signed)
Reclast to be administered at [facility] Code is [phone] Diagnosis is osteoporosis M81.0 and history of pelvic fracture Z 87.81

## 2016-04-07 ENCOUNTER — Encounter (HOSPITAL_COMMUNITY): Admission: RE | Admit: 2016-04-07 | Payer: Commercial Managed Care - HMO | Source: Ambulatory Visit

## 2016-04-07 ENCOUNTER — Telehealth: Payer: Self-pay | Admitting: Family

## 2016-04-07 NOTE — Telephone Encounter (Signed)
Spoke with Eber Jonesarolyn.  They need reclast orders for Mrs. Fujikawa.  They faxed over Friday.  Explained that I was out of office and it might take 48 hours.  Eber JonesCarolyn also mentioned that they are waiting on pre certification.  I spoke with Lennox Grumblesebbie Boles about this last week and she was looking into getting Reclast procedure pre certified.  Will forward to her to see if pre cert is completed.

## 2016-04-07 NOTE — Telephone Encounter (Signed)
[facility]  Should be soon

## 2016-04-09 ENCOUNTER — Ambulatory Visit: Payer: Commercial Managed Care - HMO | Admitting: Urology

## 2016-04-11 IMAGING — CT CT HEAD W/O CM
1 series · 16 of 30 positions shown, 20 images · non-contrast
Comparison: CT 11/04/2009

CLINICAL DATA: Weakness, recent diagnosis of urinary tract
infection

EXAM:
CT HEAD WITHOUT CONTRAST
TECHNIQUE: Contiguous axial images were obtained from the base of the skull
through the vertex without intravenous contrast.

[Series 2: headseq 4.8 h37s · axial · 0.43mm/px · z∈[+174,+331]mm · 16 of 36 slices shown, 20 images]
[im 2/36  brain]
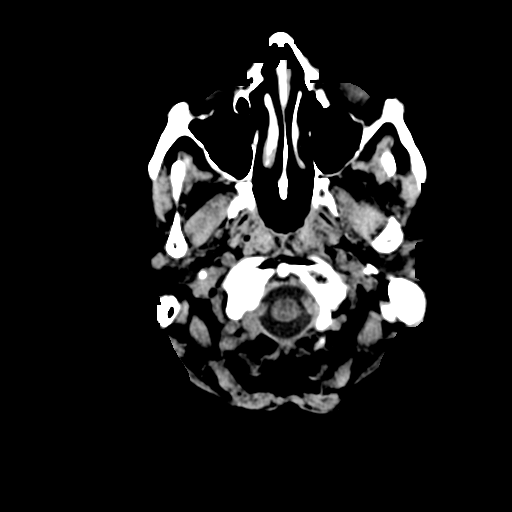
[im 2/36  bone]
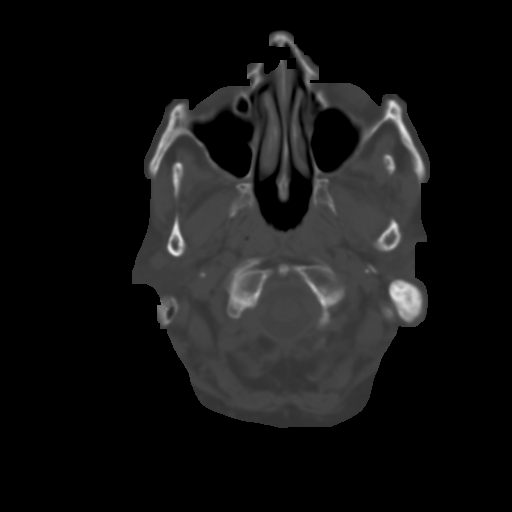
[im 4/36  brain]
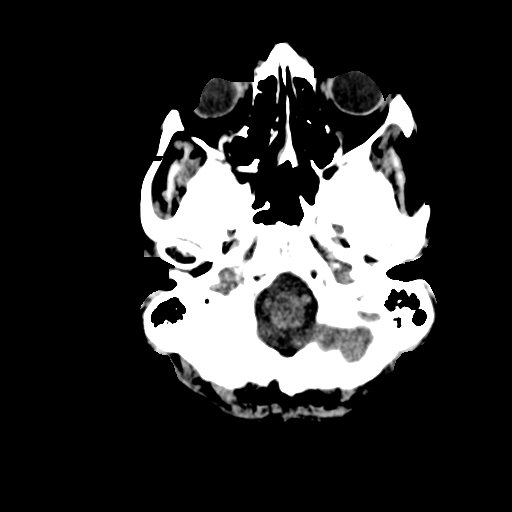
[im 7/36  brain]
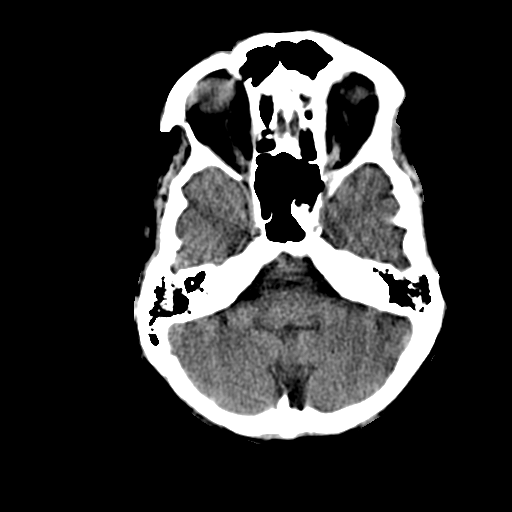
[im 9/36  brain]
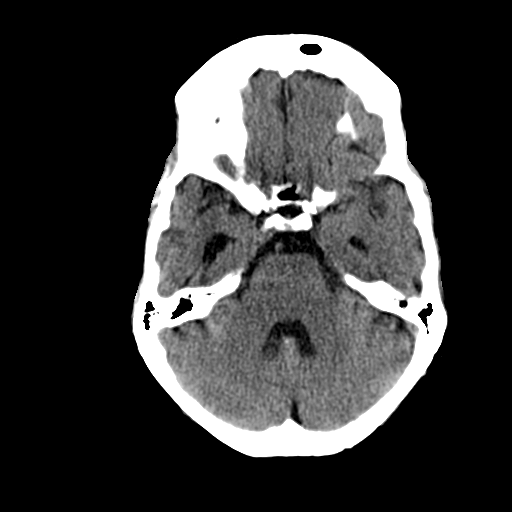
[im 10/36  brain]
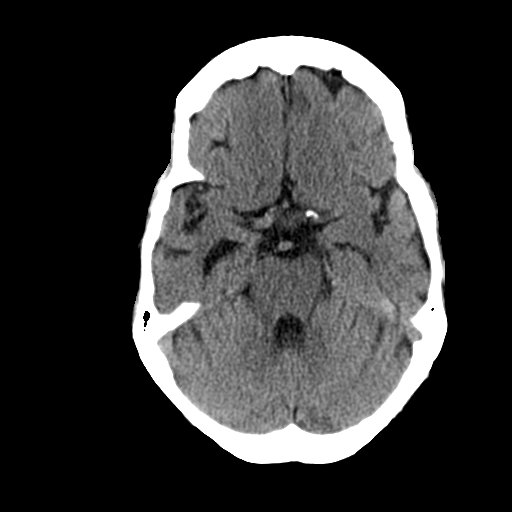
[im 10/36  bone]
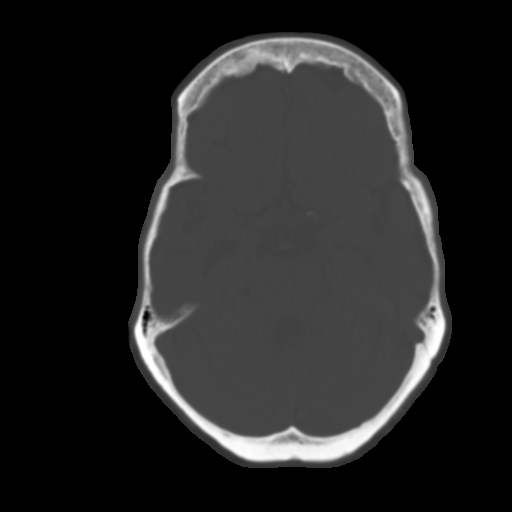
[im 13/36  brain]
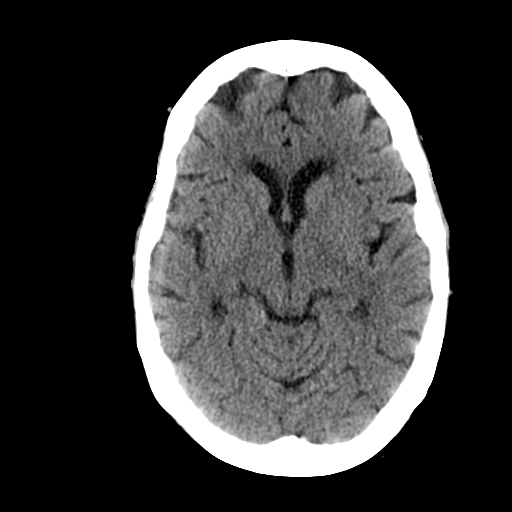
[im 15/36  brain]
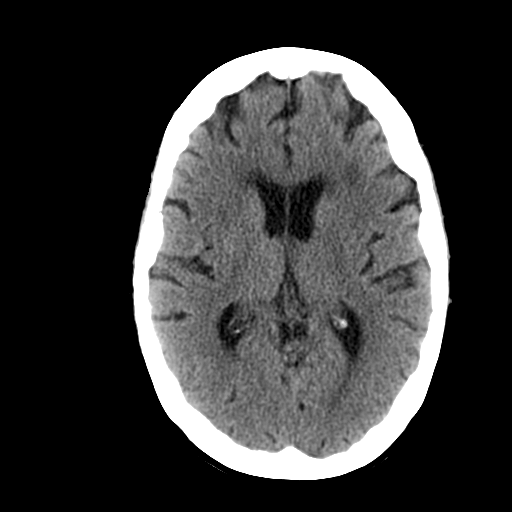
[im 17/36  brain]
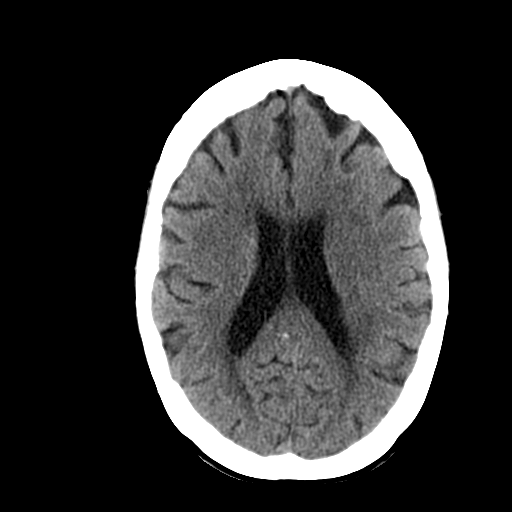
[im 19/36  brain]
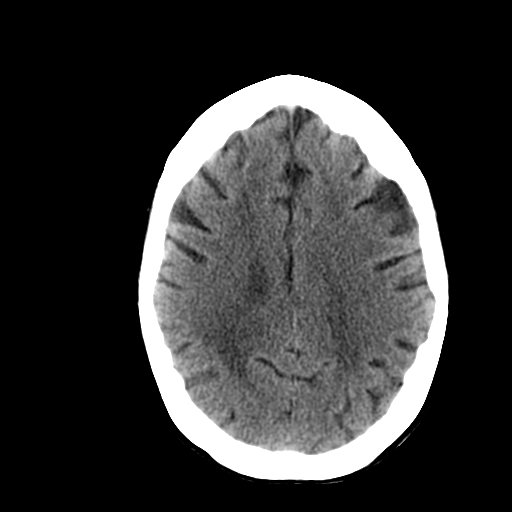
[im 19/36  bone]
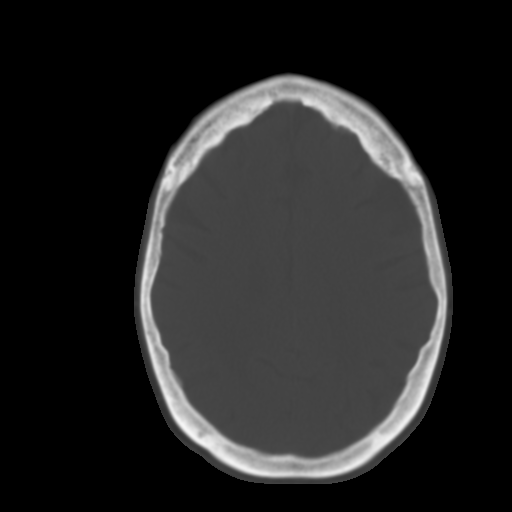
[im 21/36  brain]
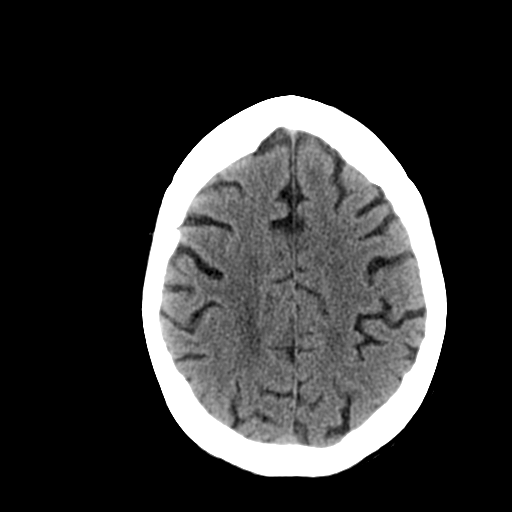
[im 23/36  brain]
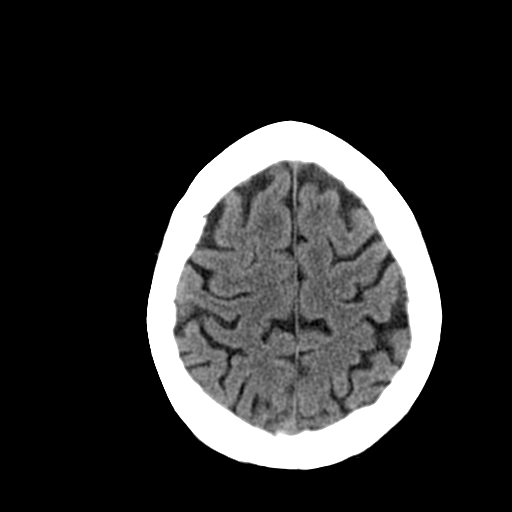
[im 26/36  brain]
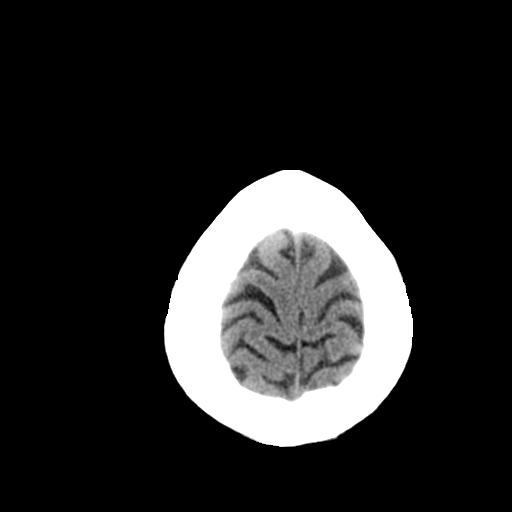
[im 27/36  brain]
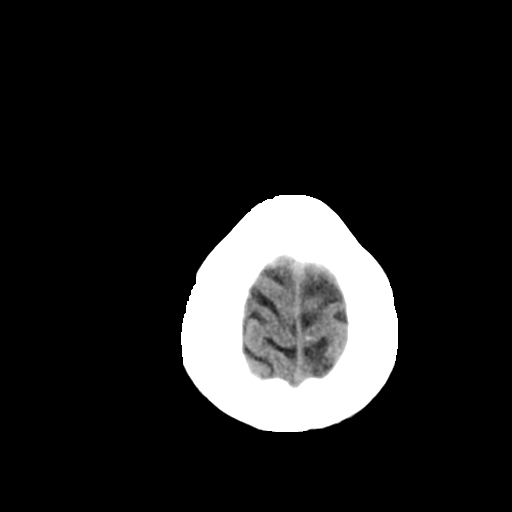
[im 27/36  bone]
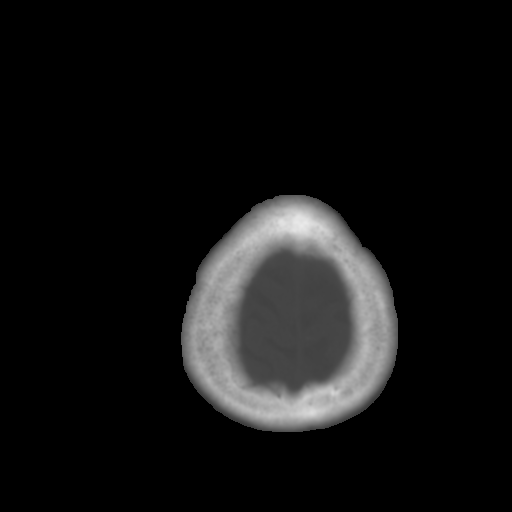
[im 29/36  brain]
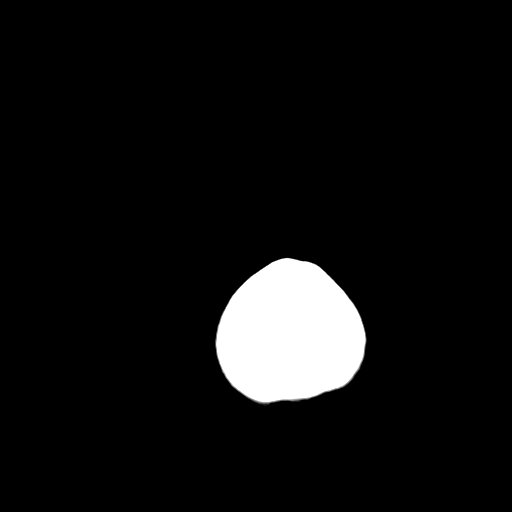
[im 32/36  brain]
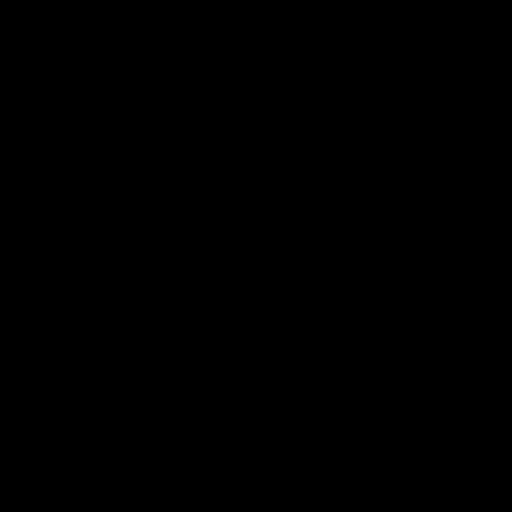
[im 34/36  brain]
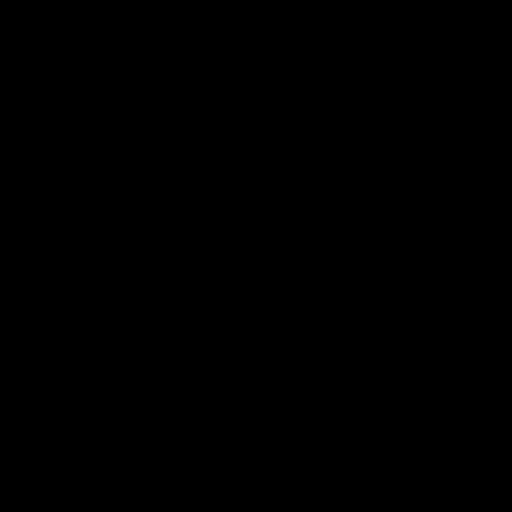

[16 of 30 positions shown; findings below may reference images not displayed]

FINDINGS: No acute intracranial hemorrhage. No focal mass lesion. No CT
evidence of acute infarction. No midline shift or mass effect. No
hydrocephalus. Basilar cisterns are patent.

There is periventricular and subcortical white matter hypodensities.
White matter disease is increased from prior.

Paranasal sinuses and  mastoid air cells are clear.
IMPRESSION: 1. No acute intracranial findings.
2. Atrophy and chronic white matter microvascular disease is advance
compared to 1377.

## 2016-04-15 ENCOUNTER — Encounter (HOSPITAL_COMMUNITY)
Admission: RE | Admit: 2016-04-15 | Discharge: 2016-04-15 | Disposition: A | Discharge status: Home / Self Care | Payer: Commercial Managed Care - HMO | Source: Ambulatory Visit | Attending: Family | Admitting: Family

## 2016-04-15 DIAGNOSIS — M810 Age-related osteoporosis without current pathological fracture: Secondary | ICD-10-CM | POA: Diagnosis not present

## 2016-04-15 LAB — COMPREHENSIVE METABOLIC PANEL
ALK PHOS: 50 U/L (ref 38–126)
ALT: 10 U/L — AB (ref 14–54)
AST: 17 U/L (ref 15–41)
Albumin: 4 g/dL (ref 3.5–5.0)
Anion gap: 4 — ABNORMAL LOW (ref 5–15)
BILIRUBIN TOTAL: 0.9 mg/dL (ref 0.3–1.2)
BUN: 20 mg/dL (ref 6–20)
CALCIUM: 8.7 mg/dL — AB (ref 8.9–10.3)
CO2: 24 mmol/L (ref 22–32)
CREATININE: 0.88 mg/dL (ref 0.44–1.00)
Chloride: 107 mmol/L (ref 101–111)
GFR, EST NON AFRICAN AMERICAN: 59 mL/min — AB (ref >60)
Glucose, Bld: 110 mg/dL — ABNORMAL HIGH (ref 65–99)
Potassium: 4.4 mmol/L (ref 3.5–5.1)
Sodium: 135 mmol/L (ref 135–145)
TOTAL PROTEIN: 6.5 g/dL (ref 6.5–8.1)

## 2016-04-15 MED ORDER — SODIUM CHLORIDE 0.9 % IV SOLN
Freq: Once | INTRAVENOUS | Status: AC
  Administered 2016-04-15: 14:00:00 via INTRAVENOUS

## 2016-04-15 MED ORDER — ZOLEDRONIC ACID 5 MG/100ML IV SOLN
5.0000 mg | Freq: Once | INTRAVENOUS | Status: AC
  Administered 2016-04-15: 5 mg via INTRAVENOUS
  Filled 2016-04-15: qty 100

## 2016-04-15 NOTE — Discharge Instructions (Signed)

## 2016-05-07 ENCOUNTER — Telehealth: Payer: Self-pay | Admitting: Family

## 2016-05-07 NOTE — Telephone Encounter (Signed)
Please advise 

## 2016-05-07 NOTE — Telephone Encounter (Signed)
Please write note for patient. 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

## 2016-05-08 ENCOUNTER — Encounter: Payer: Self-pay | Admitting: *Deleted

## 2016-05-08 NOTE — Telephone Encounter (Signed)
Letter to be written and placed up front for pick up, son aware by VM

## 2016-06-03 ENCOUNTER — Other Ambulatory Visit: Payer: Self-pay | Admitting: Family

## 2016-06-04 ENCOUNTER — Ambulatory Visit (INDEPENDENT_AMBULATORY_CARE_PROVIDER_SITE_OTHER): Payer: Commercial Managed Care - HMO | Admitting: Urology

## 2016-06-04 DIAGNOSIS — N3020 Other chronic cystitis without hematuria: Secondary | ICD-10-CM | POA: Diagnosis not present

## 2016-06-04 DIAGNOSIS — N3281 Overactive bladder: Secondary | ICD-10-CM | POA: Diagnosis not present

## 2016-06-10 ENCOUNTER — Other Ambulatory Visit: Payer: Self-pay | Admitting: Family

## 2016-06-17 DIAGNOSIS — M4854XA Collapsed vertebra, not elsewhere classified, thoracic region, initial encounter for fracture: Secondary | ICD-10-CM | POA: Diagnosis not present

## 2016-06-17 DIAGNOSIS — Z79899 Other long term (current) drug therapy: Secondary | ICD-10-CM | POA: Diagnosis not present

## 2016-06-17 DIAGNOSIS — E7800 Pure hypercholesterolemia, unspecified: Secondary | ICD-10-CM | POA: Diagnosis not present

## 2016-06-17 DIAGNOSIS — I252 Old myocardial infarction: Secondary | ICD-10-CM | POA: Diagnosis not present

## 2016-06-17 DIAGNOSIS — Z7902 Long term (current) use of antithrombotics/antiplatelets: Secondary | ICD-10-CM | POA: Diagnosis not present

## 2016-06-17 DIAGNOSIS — R531 Weakness: Secondary | ICD-10-CM | POA: Diagnosis not present

## 2016-06-17 DIAGNOSIS — Z955 Presence of coronary angioplasty implant and graft: Secondary | ICD-10-CM | POA: Diagnosis not present

## 2016-06-17 DIAGNOSIS — S22000S Wedge compression fracture of unspecified thoracic vertebra, sequela: Secondary | ICD-10-CM | POA: Diagnosis not present

## 2016-06-17 DIAGNOSIS — I119 Hypertensive heart disease without heart failure: Secondary | ICD-10-CM | POA: Diagnosis not present

## 2016-06-17 DIAGNOSIS — K219 Gastro-esophageal reflux disease without esophagitis: Secondary | ICD-10-CM | POA: Diagnosis not present

## 2016-06-17 DIAGNOSIS — R05 Cough: Secondary | ICD-10-CM | POA: Diagnosis not present

## 2016-07-04 ENCOUNTER — Ambulatory Visit (INDEPENDENT_AMBULATORY_CARE_PROVIDER_SITE_OTHER): Payer: Commercial Managed Care - HMO | Admitting: Urology

## 2016-07-04 DIAGNOSIS — N3281 Overactive bladder: Secondary | ICD-10-CM | POA: Diagnosis not present

## 2016-07-04 DIAGNOSIS — N3020 Other chronic cystitis without hematuria: Secondary | ICD-10-CM | POA: Diagnosis not present

## 2016-08-03 DIAGNOSIS — Z955 Presence of coronary angioplasty implant and graft: Secondary | ICD-10-CM | POA: Diagnosis not present

## 2016-08-03 DIAGNOSIS — S40021A Contusion of right upper arm, initial encounter: Secondary | ICD-10-CM | POA: Diagnosis not present

## 2016-08-03 DIAGNOSIS — I119 Hypertensive heart disease without heart failure: Secondary | ICD-10-CM | POA: Diagnosis not present

## 2016-08-03 DIAGNOSIS — I252 Old myocardial infarction: Secondary | ICD-10-CM | POA: Diagnosis not present

## 2016-08-03 DIAGNOSIS — K219 Gastro-esophageal reflux disease without esophagitis: Secondary | ICD-10-CM | POA: Diagnosis not present

## 2016-08-03 DIAGNOSIS — E7800 Pure hypercholesterolemia, unspecified: Secondary | ICD-10-CM | POA: Diagnosis not present

## 2016-08-03 DIAGNOSIS — F419 Anxiety disorder, unspecified: Secondary | ICD-10-CM | POA: Diagnosis not present

## 2016-08-03 DIAGNOSIS — Z79899 Other long term (current) drug therapy: Secondary | ICD-10-CM | POA: Diagnosis not present

## 2016-08-03 DIAGNOSIS — W1839XA Other fall on same level, initial encounter: Secondary | ICD-10-CM | POA: Diagnosis not present

## 2016-08-03 DIAGNOSIS — M79601 Pain in right arm: Secondary | ICD-10-CM | POA: Diagnosis not present

## 2016-08-03 DIAGNOSIS — Z947 Corneal transplant status: Secondary | ICD-10-CM | POA: Diagnosis not present

## 2016-08-03 IMAGING — CR DG CHEST 1V PORT
1 series · 1 of 1 positions shown · non-contrast
Comparison: 05/04/2014; 02/18/2014; 06/05/2012

CLINICAL DATA: Productive cough with white sputum, weakness and
shortness of breath. Former smoker. Initial encounter.

EXAM:
PORTABLE CHEST - 1 VIEW

[portable]
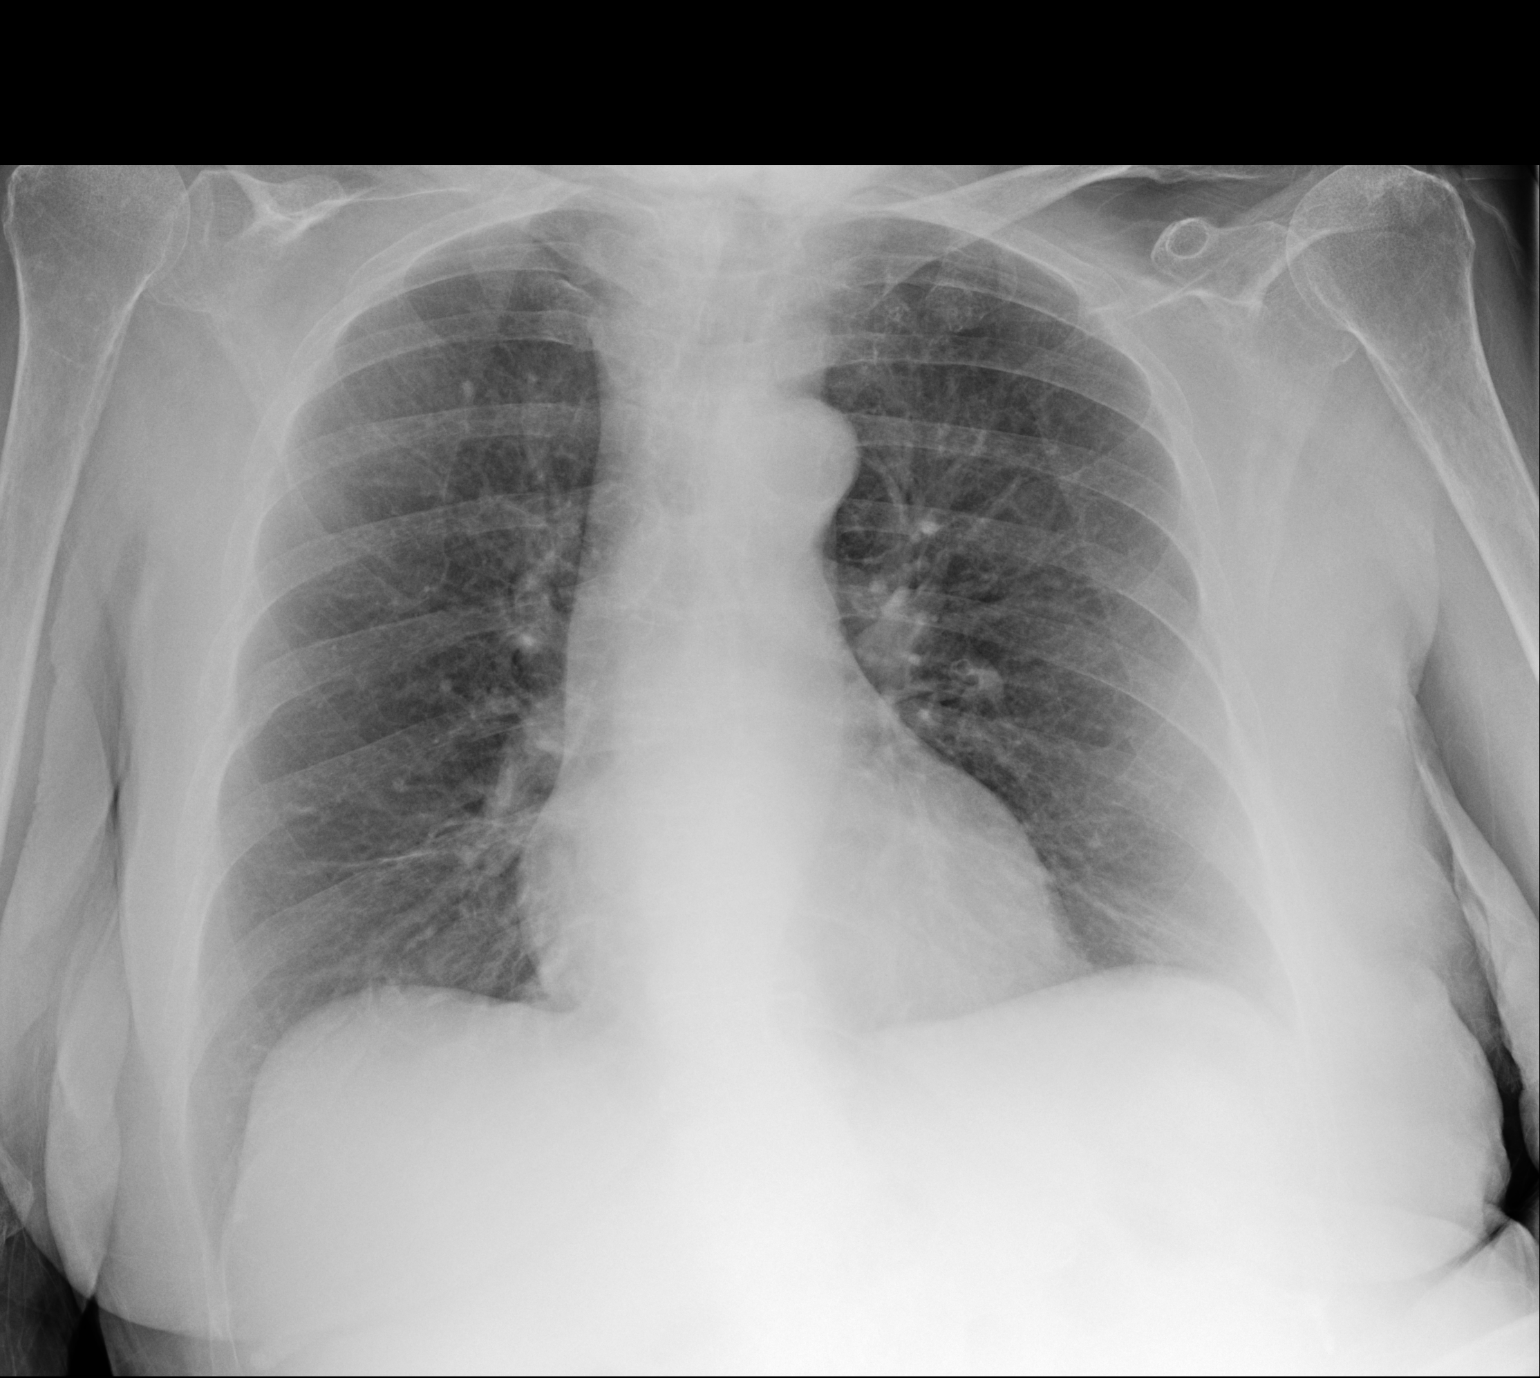

[1 of 1 positions shown; findings below may reference images not displayed]

FINDINGS: Grossly unchanged cardiac silhouette and mediastinal contours with
atherosclerotic plaque within a tortuous thoracic aorta. There is
unchanged thickening of the right paratracheal stripe presumably
secondary to prominent vasculature. There is unchanged mild diffuse
slightly nodular thickening of the pulmonary interstitium. Unchanged
punctate (approximately 0.5 cm) granuloma within the right upper
lung is unchanged since the [DATE] examination. Bibasilar linear
heterogeneous opacities are unchanged. No new focal airspace
opacities. No pleural effusion or pneumothorax. No evidence of
edema. No acute osseus abnormalities.
IMPRESSION: Bronchitic change and bibasilar atelectasis without acute
cardiopulmonary disease.

## 2016-08-04 ENCOUNTER — Encounter: Payer: Self-pay | Admitting: Family

## 2016-08-05 ENCOUNTER — Other Ambulatory Visit: Payer: Self-pay | Admitting: Family

## 2016-08-05 DIAGNOSIS — J309 Allergic rhinitis, unspecified: Secondary | ICD-10-CM

## 2016-08-22 ENCOUNTER — Ambulatory Visit: Payer: Medicare HMO | Admitting: Urology

## 2016-09-02 ENCOUNTER — Other Ambulatory Visit: Payer: Self-pay | Admitting: Family

## 2016-09-08 ENCOUNTER — Other Ambulatory Visit: Payer: Self-pay | Admitting: Family

## 2016-09-08 ENCOUNTER — Ambulatory Visit (INDEPENDENT_AMBULATORY_CARE_PROVIDER_SITE_OTHER): Payer: Medicare HMO | Admitting: Pediatrics

## 2016-09-08 ENCOUNTER — Encounter: Payer: Self-pay | Admitting: Pediatrics

## 2016-09-08 VITALS — BP 150/71 | HR 73 | Temp 97.8°F | Ht 62.0 in | Wt 162.4 lb

## 2016-09-08 DIAGNOSIS — J441 Chronic obstructive pulmonary disease with (acute) exacerbation: Secondary | ICD-10-CM | POA: Diagnosis not present

## 2016-09-08 MED ORDER — SPACER/AERO CHAMBER MOUTHPIECE MISC: 1.0000 | Freq: Four times a day (QID) | 0 refills | Status: DC | PRN

## 2016-09-08 MED ORDER — DOXYCYCLINE HYCLATE 100 MG PO TABS: 100.0000 mg | ORAL_TABLET | Freq: Two times a day (BID) | ORAL | 0 refills | Status: DC

## 2016-09-08 MED ORDER — ALBUTEROL SULFATE HFA 108 (90 BASE) MCG/ACT IN AERS: 2.0000 | INHALATION_SPRAY | Freq: Four times a day (QID) | RESPIRATORY_TRACT | 0 refills | Status: DC | PRN

## 2016-09-08 MED ORDER — PREDNISONE 20 MG PO TABS: 40.0000 mg | ORAL_TABLET | Freq: Every day | ORAL | 0 refills | Status: AC

## 2016-09-08 NOTE — Progress Notes (Signed)
  Subjective:   Patient ID: [name], female    DOB: [birth year], 83 y.o.   MRN: [id number] CC: Cough (couple days started about Friday, dry cough, OTC generic mucinex childs dosage) and Wheezing  HPI: [name] is a 83 y.o. female presenting for Cough (couple days started about Friday, dry cough, OTC generic mucinex childs dosage) and Wheezing  Gets cold and hot past few days mucinex at home, helping some Humidifier has been helping Has been using flonase some Has been a while since needed albuterol, doesn't have any at home, hasnt been taking No fevers Appetite is ok Coughing more Coughing up more Feels more SOB  Relevant past medical, surgical, family and social history reviewed. Allergies and medications reviewed and updated. History  Smoking Status  . Never Smoker  Smokeless Tobacco  . Never Used   ROS: Per HPI   Objective:    BP (!) 150/71   Pulse 73   Temp 97.8 F (36.6 C) (Oral)   Ht 5\' 2"  (1.575 m)   Wt 162 lb 6.4 oz (73.7 kg)   SpO2 94%   BMI 29.70 kg/m   Wt Readings from Last 3 Encounters:  09/08/16 162 lb 6.4 oz (73.7 kg)  04/15/16 162 lb (73.5 kg)  03/27/16 162 lb (73.5 kg)    Gen: NAD, alert, cooperative with exam, NCAT EYES: EOMI, no conjunctival injection, or no icterus ENT:  TMs pink b/l, OP without erythema LYMPH: no cervical LAD CV: NRRR, normal S1/S2 Resp: moving air fair, b/l wheeze with exhalation, no crackles, normal WOB Abd: +BS, soft, NTND. no guarding  Ext: No edema, warm Neuro: Alert   Assessment & Plan:  [name] was seen today for cough and wheezing.  Diagnoses and all orders for this visit:  COPD exacerbation (HCC) 3 days of prednisone, doxycycline given azithrmycin allergy, albuterol TID If not improving let us know Comfortable breathing today, O2 sats 94% -     albuterol (PROVENTIL HFA;VENTOLIN HFA) 108 (90 Base) MCG/ACT inhaler; Inhale 2 puffs into the lungs every 6 (six) hours as needed for wheezing or  shortness of breath. -     predniSONE (DELTASONE) 20 MG tablet; Take 2 tablets (40 mg total) by mouth daily with breakfast. -     doxycycline (VIBRA-TABS) 100 MG tablet; Take 1 tablet (100 mg total) by mouth 2 (two) times daily. -     Spacer/Aero Chamber Mouthpiece MISC; 1 each by Does not apply route every 6 (six) hours as needed.   Follow up plan: Return if symptoms worsen or fail to improve. Rex Krasarol Deshannon Hinchliffe, MD [facility]

## 2016-09-08 NOTE — Patient Instructions (Signed)
Use albuterol with spacer three times a day while wheezing and short of breath If worsening breathing let me know  Take prednisone 40mg  for 3 days Take doxycycline twice a day for 5 days

## 2016-09-12 DIAGNOSIS — R41841 Cognitive communication deficit: Secondary | ICD-10-CM | POA: Diagnosis not present

## 2016-09-12 DIAGNOSIS — R1312 Dysphagia, oropharyngeal phase: Secondary | ICD-10-CM | POA: Diagnosis not present

## 2016-09-12 DIAGNOSIS — K219 Gastro-esophageal reflux disease without esophagitis: Secondary | ICD-10-CM | POA: Diagnosis not present

## 2016-09-12 DIAGNOSIS — E784 Other hyperlipidemia: Secondary | ICD-10-CM | POA: Diagnosis not present

## 2016-09-12 DIAGNOSIS — J101 Influenza due to other identified influenza virus with other respiratory manifestations: Secondary | ICD-10-CM | POA: Diagnosis not present

## 2016-09-12 DIAGNOSIS — I10 Essential (primary) hypertension: Secondary | ICD-10-CM | POA: Diagnosis not present

## 2016-09-12 DIAGNOSIS — J441 Chronic obstructive pulmonary disease with (acute) exacerbation: Secondary | ICD-10-CM | POA: Diagnosis not present

## 2016-09-12 DIAGNOSIS — F05 Delirium due to known physiological condition: Secondary | ICD-10-CM | POA: Diagnosis not present

## 2016-09-12 DIAGNOSIS — R05 Cough: Secondary | ICD-10-CM | POA: Diagnosis not present

## 2016-09-12 DIAGNOSIS — R278 Other lack of coordination: Secondary | ICD-10-CM | POA: Diagnosis not present

## 2016-09-12 DIAGNOSIS — R0902 Hypoxemia: Secondary | ICD-10-CM | POA: Diagnosis not present

## 2016-09-12 DIAGNOSIS — D649 Anemia, unspecified: Secondary | ICD-10-CM | POA: Diagnosis not present

## 2016-09-12 DIAGNOSIS — R531 Weakness: Secondary | ICD-10-CM | POA: Diagnosis not present

## 2016-09-12 DIAGNOSIS — J111 Influenza due to unidentified influenza virus with other respiratory manifestations: Secondary | ICD-10-CM | POA: Diagnosis not present

## 2016-09-12 DIAGNOSIS — F0390 Unspecified dementia without behavioral disturbance: Secondary | ICD-10-CM | POA: Diagnosis not present

## 2016-09-12 DIAGNOSIS — J1089 Influenza due to other identified influenza virus with other manifestations: Secondary | ICD-10-CM | POA: Diagnosis not present

## 2016-09-12 DIAGNOSIS — J9811 Atelectasis: Secondary | ICD-10-CM | POA: Diagnosis not present

## 2016-09-12 DIAGNOSIS — J09X9 Influenza due to identified novel influenza A virus with other manifestations: Secondary | ICD-10-CM | POA: Diagnosis not present

## 2016-09-12 DIAGNOSIS — Z955 Presence of coronary angioplasty implant and graft: Secondary | ICD-10-CM | POA: Diagnosis not present

## 2016-09-12 DIAGNOSIS — E860 Dehydration: Secondary | ICD-10-CM | POA: Diagnosis not present

## 2016-09-12 DIAGNOSIS — R0602 Shortness of breath: Secondary | ICD-10-CM | POA: Diagnosis not present

## 2016-09-12 DIAGNOSIS — M6281 Muscle weakness (generalized): Secondary | ICD-10-CM | POA: Diagnosis not present

## 2016-09-12 DIAGNOSIS — I259 Chronic ischemic heart disease, unspecified: Secondary | ICD-10-CM | POA: Diagnosis not present

## 2016-09-12 DIAGNOSIS — R2689 Other abnormalities of gait and mobility: Secondary | ICD-10-CM | POA: Diagnosis not present

## 2016-09-13 DIAGNOSIS — J111 Influenza due to unidentified influenza virus with other respiratory manifestations: Secondary | ICD-10-CM | POA: Diagnosis not present

## 2016-09-13 DIAGNOSIS — J441 Chronic obstructive pulmonary disease with (acute) exacerbation: Secondary | ICD-10-CM | POA: Diagnosis not present

## 2016-09-14 DIAGNOSIS — J111 Influenza due to unidentified influenza virus with other respiratory manifestations: Secondary | ICD-10-CM | POA: Diagnosis not present

## 2016-09-15 DIAGNOSIS — D649 Anemia, unspecified: Secondary | ICD-10-CM | POA: Diagnosis not present

## 2016-09-15 DIAGNOSIS — F0390 Unspecified dementia without behavioral disturbance: Secondary | ICD-10-CM | POA: Diagnosis not present

## 2016-09-15 DIAGNOSIS — Z955 Presence of coronary angioplasty implant and graft: Secondary | ICD-10-CM | POA: Diagnosis not present

## 2016-09-15 DIAGNOSIS — J441 Chronic obstructive pulmonary disease with (acute) exacerbation: Secondary | ICD-10-CM | POA: Diagnosis not present

## 2016-09-15 DIAGNOSIS — E860 Dehydration: Secondary | ICD-10-CM | POA: Diagnosis not present

## 2016-09-15 DIAGNOSIS — R0902 Hypoxemia: Secondary | ICD-10-CM | POA: Diagnosis not present

## 2016-09-15 DIAGNOSIS — F05 Delirium due to known physiological condition: Secondary | ICD-10-CM | POA: Diagnosis not present

## 2016-09-15 DIAGNOSIS — J111 Influenza due to unidentified influenza virus with other respiratory manifestations: Secondary | ICD-10-CM | POA: Diagnosis not present

## 2016-09-15 DIAGNOSIS — I10 Essential (primary) hypertension: Secondary | ICD-10-CM | POA: Diagnosis not present

## 2016-09-15 DIAGNOSIS — J101 Influenza due to other identified influenza virus with other respiratory manifestations: Secondary | ICD-10-CM | POA: Diagnosis not present

## 2016-09-16 DIAGNOSIS — J441 Chronic obstructive pulmonary disease with (acute) exacerbation: Secondary | ICD-10-CM | POA: Diagnosis not present

## 2016-09-16 DIAGNOSIS — Z955 Presence of coronary angioplasty implant and graft: Secondary | ICD-10-CM | POA: Diagnosis not present

## 2016-09-16 DIAGNOSIS — R0902 Hypoxemia: Secondary | ICD-10-CM | POA: Diagnosis not present

## 2016-09-16 DIAGNOSIS — J101 Influenza due to other identified influenza virus with other respiratory manifestations: Secondary | ICD-10-CM | POA: Diagnosis not present

## 2016-09-16 DIAGNOSIS — E860 Dehydration: Secondary | ICD-10-CM | POA: Diagnosis not present

## 2016-09-16 DIAGNOSIS — I10 Essential (primary) hypertension: Secondary | ICD-10-CM | POA: Diagnosis not present

## 2016-09-16 DIAGNOSIS — F0390 Unspecified dementia without behavioral disturbance: Secondary | ICD-10-CM | POA: Diagnosis not present

## 2016-09-16 DIAGNOSIS — F05 Delirium due to known physiological condition: Secondary | ICD-10-CM | POA: Diagnosis not present

## 2016-09-16 DIAGNOSIS — D649 Anemia, unspecified: Secondary | ICD-10-CM | POA: Diagnosis not present

## 2016-09-17 ENCOUNTER — Ambulatory Visit: Payer: Commercial Managed Care - HMO | Admitting: Cardiology

## 2016-09-17 DIAGNOSIS — D649 Anemia, unspecified: Secondary | ICD-10-CM | POA: Diagnosis not present

## 2016-09-17 DIAGNOSIS — E860 Dehydration: Secondary | ICD-10-CM | POA: Diagnosis not present

## 2016-09-17 DIAGNOSIS — J101 Influenza due to other identified influenza virus with other respiratory manifestations: Secondary | ICD-10-CM | POA: Diagnosis not present

## 2016-09-17 DIAGNOSIS — Z955 Presence of coronary angioplasty implant and graft: Secondary | ICD-10-CM | POA: Diagnosis not present

## 2016-09-17 DIAGNOSIS — R0902 Hypoxemia: Secondary | ICD-10-CM | POA: Diagnosis not present

## 2016-09-17 DIAGNOSIS — F0390 Unspecified dementia without behavioral disturbance: Secondary | ICD-10-CM | POA: Diagnosis not present

## 2016-09-17 DIAGNOSIS — F05 Delirium due to known physiological condition: Secondary | ICD-10-CM | POA: Diagnosis not present

## 2016-09-17 DIAGNOSIS — I10 Essential (primary) hypertension: Secondary | ICD-10-CM | POA: Diagnosis not present

## 2016-09-17 DIAGNOSIS — J441 Chronic obstructive pulmonary disease with (acute) exacerbation: Secondary | ICD-10-CM | POA: Diagnosis not present

## 2016-09-18 DIAGNOSIS — E860 Dehydration: Secondary | ICD-10-CM | POA: Diagnosis not present

## 2016-09-18 DIAGNOSIS — D649 Anemia, unspecified: Secondary | ICD-10-CM | POA: Diagnosis not present

## 2016-09-18 DIAGNOSIS — I10 Essential (primary) hypertension: Secondary | ICD-10-CM | POA: Diagnosis not present

## 2016-09-18 DIAGNOSIS — J101 Influenza due to other identified influenza virus with other respiratory manifestations: Secondary | ICD-10-CM | POA: Diagnosis not present

## 2016-09-18 DIAGNOSIS — F05 Delirium due to known physiological condition: Secondary | ICD-10-CM | POA: Diagnosis not present

## 2016-09-18 DIAGNOSIS — F0390 Unspecified dementia without behavioral disturbance: Secondary | ICD-10-CM | POA: Diagnosis not present

## 2016-09-18 DIAGNOSIS — R0902 Hypoxemia: Secondary | ICD-10-CM | POA: Diagnosis not present

## 2016-09-18 DIAGNOSIS — Z955 Presence of coronary angioplasty implant and graft: Secondary | ICD-10-CM | POA: Diagnosis not present

## 2016-09-18 DIAGNOSIS — J441 Chronic obstructive pulmonary disease with (acute) exacerbation: Secondary | ICD-10-CM | POA: Diagnosis not present

## 2016-09-19 DIAGNOSIS — Z955 Presence of coronary angioplasty implant and graft: Secondary | ICD-10-CM | POA: Diagnosis not present

## 2016-09-19 DIAGNOSIS — R3981 Functional urinary incontinence: Secondary | ICD-10-CM | POA: Diagnosis not present

## 2016-09-19 DIAGNOSIS — R41841 Cognitive communication deficit: Secondary | ICD-10-CM | POA: Diagnosis not present

## 2016-09-19 DIAGNOSIS — J101 Influenza due to other identified influenza virus with other respiratory manifestations: Secondary | ICD-10-CM | POA: Diagnosis not present

## 2016-09-19 DIAGNOSIS — K219 Gastro-esophageal reflux disease without esophagitis: Secondary | ICD-10-CM | POA: Diagnosis not present

## 2016-09-19 DIAGNOSIS — R0902 Hypoxemia: Secondary | ICD-10-CM | POA: Diagnosis not present

## 2016-09-19 DIAGNOSIS — J441 Chronic obstructive pulmonary disease with (acute) exacerbation: Secondary | ICD-10-CM | POA: Diagnosis not present

## 2016-09-19 DIAGNOSIS — I259 Chronic ischemic heart disease, unspecified: Secondary | ICD-10-CM | POA: Diagnosis not present

## 2016-09-19 DIAGNOSIS — R2689 Other abnormalities of gait and mobility: Secondary | ICD-10-CM | POA: Diagnosis not present

## 2016-09-19 DIAGNOSIS — J09X9 Influenza due to identified novel influenza A virus with other manifestations: Secondary | ICD-10-CM | POA: Diagnosis not present

## 2016-09-19 DIAGNOSIS — E860 Dehydration: Secondary | ICD-10-CM | POA: Diagnosis not present

## 2016-09-19 DIAGNOSIS — R05 Cough: Secondary | ICD-10-CM | POA: Diagnosis not present

## 2016-09-19 DIAGNOSIS — E784 Other hyperlipidemia: Secondary | ICD-10-CM | POA: Diagnosis not present

## 2016-09-19 DIAGNOSIS — J111 Influenza due to unidentified influenza virus with other respiratory manifestations: Secondary | ICD-10-CM | POA: Diagnosis not present

## 2016-09-19 DIAGNOSIS — I10 Essential (primary) hypertension: Secondary | ICD-10-CM | POA: Diagnosis not present

## 2016-09-19 DIAGNOSIS — R1312 Dysphagia, oropharyngeal phase: Secondary | ICD-10-CM | POA: Diagnosis not present

## 2016-09-19 DIAGNOSIS — F0390 Unspecified dementia without behavioral disturbance: Secondary | ICD-10-CM | POA: Diagnosis not present

## 2016-09-19 DIAGNOSIS — F05 Delirium due to known physiological condition: Secondary | ICD-10-CM | POA: Diagnosis not present

## 2016-09-19 DIAGNOSIS — D649 Anemia, unspecified: Secondary | ICD-10-CM | POA: Diagnosis not present

## 2016-09-19 DIAGNOSIS — M6281 Muscle weakness (generalized): Secondary | ICD-10-CM | POA: Diagnosis not present

## 2016-09-19 DIAGNOSIS — R278 Other lack of coordination: Secondary | ICD-10-CM | POA: Diagnosis not present

## 2016-10-09 ENCOUNTER — Ambulatory Visit: Payer: Commercial Managed Care - HMO | Admitting: Cardiology

## 2016-10-14 ENCOUNTER — Ambulatory Visit: Payer: Commercial Managed Care - HMO | Admitting: Pharmacist

## 2016-10-22 DIAGNOSIS — R3981 Functional urinary incontinence: Secondary | ICD-10-CM | POA: Diagnosis not present

## 2016-10-26 DIAGNOSIS — R2689 Other abnormalities of gait and mobility: Secondary | ICD-10-CM | POA: Diagnosis not present

## 2016-10-26 DIAGNOSIS — R262 Difficulty in walking, not elsewhere classified: Secondary | ICD-10-CM | POA: Diagnosis not present

## 2016-10-26 DIAGNOSIS — F0390 Unspecified dementia without behavioral disturbance: Secondary | ICD-10-CM | POA: Diagnosis not present

## 2016-10-26 DIAGNOSIS — J441 Chronic obstructive pulmonary disease with (acute) exacerbation: Secondary | ICD-10-CM | POA: Diagnosis not present

## 2016-10-26 DIAGNOSIS — Z7902 Long term (current) use of antithrombotics/antiplatelets: Secondary | ICD-10-CM | POA: Diagnosis not present

## 2016-10-26 DIAGNOSIS — F419 Anxiety disorder, unspecified: Secondary | ICD-10-CM | POA: Diagnosis not present

## 2016-10-26 DIAGNOSIS — M6281 Muscle weakness (generalized): Secondary | ICD-10-CM | POA: Diagnosis not present

## 2016-10-26 DIAGNOSIS — E782 Mixed hyperlipidemia: Secondary | ICD-10-CM | POA: Diagnosis not present

## 2016-10-26 DIAGNOSIS — I10 Essential (primary) hypertension: Secondary | ICD-10-CM | POA: Diagnosis not present

## 2016-10-28 DIAGNOSIS — F0390 Unspecified dementia without behavioral disturbance: Secondary | ICD-10-CM | POA: Diagnosis not present

## 2016-10-28 DIAGNOSIS — F419 Anxiety disorder, unspecified: Secondary | ICD-10-CM | POA: Diagnosis not present

## 2016-10-28 DIAGNOSIS — E782 Mixed hyperlipidemia: Secondary | ICD-10-CM | POA: Diagnosis not present

## 2016-10-28 DIAGNOSIS — R262 Difficulty in walking, not elsewhere classified: Secondary | ICD-10-CM | POA: Diagnosis not present

## 2016-10-28 DIAGNOSIS — Z7902 Long term (current) use of antithrombotics/antiplatelets: Secondary | ICD-10-CM | POA: Diagnosis not present

## 2016-10-28 DIAGNOSIS — I10 Essential (primary) hypertension: Secondary | ICD-10-CM | POA: Diagnosis not present

## 2016-10-28 DIAGNOSIS — M6281 Muscle weakness (generalized): Secondary | ICD-10-CM | POA: Diagnosis not present

## 2016-10-28 DIAGNOSIS — R2689 Other abnormalities of gait and mobility: Secondary | ICD-10-CM | POA: Diagnosis not present

## 2016-10-28 DIAGNOSIS — J441 Chronic obstructive pulmonary disease with (acute) exacerbation: Secondary | ICD-10-CM | POA: Diagnosis not present

## 2016-10-29 DIAGNOSIS — M6281 Muscle weakness (generalized): Secondary | ICD-10-CM | POA: Diagnosis not present

## 2016-10-29 DIAGNOSIS — R2689 Other abnormalities of gait and mobility: Secondary | ICD-10-CM | POA: Diagnosis not present

## 2016-10-29 DIAGNOSIS — Z7902 Long term (current) use of antithrombotics/antiplatelets: Secondary | ICD-10-CM | POA: Diagnosis not present

## 2016-10-29 DIAGNOSIS — F0390 Unspecified dementia without behavioral disturbance: Secondary | ICD-10-CM | POA: Diagnosis not present

## 2016-10-29 DIAGNOSIS — E782 Mixed hyperlipidemia: Secondary | ICD-10-CM | POA: Diagnosis not present

## 2016-10-29 DIAGNOSIS — F419 Anxiety disorder, unspecified: Secondary | ICD-10-CM | POA: Diagnosis not present

## 2016-10-29 DIAGNOSIS — J441 Chronic obstructive pulmonary disease with (acute) exacerbation: Secondary | ICD-10-CM | POA: Diagnosis not present

## 2016-10-29 DIAGNOSIS — R262 Difficulty in walking, not elsewhere classified: Secondary | ICD-10-CM | POA: Diagnosis not present

## 2016-10-29 DIAGNOSIS — I10 Essential (primary) hypertension: Secondary | ICD-10-CM | POA: Diagnosis not present

## 2016-10-30 ENCOUNTER — Encounter: Payer: Self-pay | Admitting: Pediatrics

## 2016-10-30 ENCOUNTER — Ambulatory Visit: Payer: Medicare HMO | Admitting: Family

## 2016-10-30 ENCOUNTER — Ambulatory Visit (INDEPENDENT_AMBULATORY_CARE_PROVIDER_SITE_OTHER): Payer: Medicare HMO | Admitting: Pediatrics

## 2016-10-30 VITALS — BP 103/66 | HR 76 | Temp 97.8°F | Ht 62.0 in | Wt 147.8 lb

## 2016-10-30 DIAGNOSIS — K219 Gastro-esophageal reflux disease without esophagitis: Secondary | ICD-10-CM

## 2016-10-30 DIAGNOSIS — E785 Hyperlipidemia, unspecified: Secondary | ICD-10-CM | POA: Diagnosis not present

## 2016-10-30 DIAGNOSIS — I2510 Atherosclerotic heart disease of native coronary artery without angina pectoris: Secondary | ICD-10-CM

## 2016-10-30 DIAGNOSIS — N3946 Mixed incontinence: Secondary | ICD-10-CM

## 2016-10-30 DIAGNOSIS — I10 Essential (primary) hypertension: Secondary | ICD-10-CM | POA: Diagnosis not present

## 2016-10-30 DIAGNOSIS — M810 Age-related osteoporosis without current pathological fracture: Secondary | ICD-10-CM | POA: Diagnosis not present

## 2016-10-30 DIAGNOSIS — E559 Vitamin D deficiency, unspecified: Secondary | ICD-10-CM | POA: Diagnosis not present

## 2016-10-30 DIAGNOSIS — J449 Chronic obstructive pulmonary disease, unspecified: Secondary | ICD-10-CM

## 2016-10-30 MED ORDER — BUSPIRONE HCL 15 MG PO TABS: 15.0000 mg | ORAL_TABLET | Freq: Two times a day (BID) | ORAL | 0 refills | Status: DC

## 2016-10-30 MED ORDER — CLOPIDOGREL BISULFATE 75 MG PO TABS: ORAL_TABLET | ORAL | 3 refills | Status: DC

## 2016-10-30 MED ORDER — ISOSORBIDE MONONITRATE ER 30 MG PO TB24: 30.0000 mg | ORAL_TABLET | Freq: Two times a day (BID) | ORAL | 3 refills | Status: DC

## 2016-10-30 MED ORDER — ATORVASTATIN CALCIUM 20 MG PO TABS: ORAL_TABLET | ORAL | 0 refills | Status: DC

## 2016-10-30 MED ORDER — LISINOPRIL 5 MG PO TABS: 5.0000 mg | ORAL_TABLET | Freq: Every day | ORAL | 4 refills | Status: DC

## 2016-10-30 MED ORDER — OXYBUTYNIN CHLORIDE ER 10 MG PO TB24: 10.0000 mg | ORAL_TABLET | Freq: Every day | ORAL | 0 refills | Status: DC

## 2016-10-30 MED ORDER — CALCIUM CITRATE-VITAMIN D 315-200 MG-UNIT PO TABS: 2.0000 | ORAL_TABLET | Freq: Every day | ORAL | 0 refills | Status: DC

## 2016-10-30 NOTE — Addendum Note (Signed)
Addended by: Margorie JohnJOHNSON, Jalie Eiland M on: 10/30/2016 03:44 PM   Modules accepted: Orders

## 2016-10-30 NOTE — Progress Notes (Signed)
Subjective:   Patient ID: [name], female    DOB: 09-27-1930, 83 y.o.   MRN: [id number] CC: Follow-up (Nursing home, Rehab)  HPI: [name] is a 83 y.o. female presenting for Follow-up (Nursing home, Rehab)  In the hospital recently for flu Discharged to SNF after hospitalization for rehab Has been doing better Left the hospital 2/15, in SNF for 2.5 weeks PT coming 3 times a week still Getting stronger daily Now living at home with son [name] Here today with son [name] also involved in care Someone with pt 24h a day now, planning to continue for 30 days after discharge Interested in life alert or similar if needed Appetite has been ok No swelling No chest pain Walking 3 times a day daily per son, planning to get her outside more to walk as weather improves Pt has no complaints today Some medications were stopped while in the hospital/SNF Son thinks she is doing very well off of them  GER: denies any stomach pain or reflux symptoms Taking pantoprazole daily, not sure when it was started, doesn't remember having any symptoms recently  HLD: taking statin nightly, no s/e  Tremor: son thinks partly due to anxiety Has been much improved since being on buspar  CAD: on plavix, imdur No recent symptoms  HTN: denies any lightheadedness, dizziness No CP  H/o pelvic fracture, no recent falls Not currently on osteoporosis medicine  Incontinence: ongoing issue No worse On oxybutinin, does help with symptoms  Relevant past medical, surgical, family and social history reviewed. Allergies and medications reviewed and updated. History  Smoking Status  . Never Smoker  Smokeless Tobacco  . Never Used   ROS: Per HPI   Objective:    BP 103/66   Pulse 76   Temp 97.8 F (36.6 C) (Oral)   Ht '5\' 2"'  (1.575 m)   Wt 147 lb 12.8 oz (67 kg)   BMI 27.03 kg/m   Wt Readings from Last 3 Encounters:  10/30/16 147 lb 12.8 oz (67 kg)  09/08/16 162 lb 6.4 oz (73.7  kg)  04/15/16 162 lb (73.5 kg)    Gen: NAD, alert, cooperative with exam, NCAT, repeats next office visit is cardiology several times throughout encounter EYES: EOMI, no conjunctival injection, or no icterus ENT:  TMs pearly gray b/l, OP without erythema LYMPH: no cervical LAD CV: NRRR, normal S1/S2, no murmur, distal pulses 2+ b/l Resp: CTABL, no wheezes, normal WOB Abd: +BS, soft, NTND. no guarding  Ext: No edema, warm Neuro: Alert, walking with walker  Assessment & Plan:  [name] was seen today for follow-up.  Diagnoses and all orders for this visit:  Hyperlipidemia, unspecified hyperlipidemia type Stable, cont below medication -     atorvastatin (LIPITOR) 20 MG tablet; TAKE 1 TABLET BY MOUTH DAILY AT 6 PM.  Osteoporosis without current pathological fracture, unspecified osteoporosis type Will discuss with pharmacist options for treatment with her insurance Cont weight bearing exercise, below -     calcium citrate-vitamin D (CITRACAL+D) 315-200 MG-UNIT tablet; Take 2 tablets by mouth daily.  Vitamin D deficiency Cont vita min D  Gastroesophageal reflux disease, esophagitis presence not specified No symptoms On pantoprazole Will do trial off of med, let me know if any worsening in symptoms  Essential hypertension, benign BP low today, 103/66 Son says has been similar for the past 3 weeks with frequent checks in hosp and SNF Will decrease lisinopril to 39m Cont to check at home -     lisinopril (PRINIVIL,ZESTRIL)  5 MG tablet; Take 1 tablet (5 mg total) by mouth daily. -     BMP8+EGFR; Future  Chronic obstructive pulmonary disease, unspecified COPD type ([place]) Stable, no recent symptoms   Atherosclerosis of native coronary artery of native heart without angina pectoris Stable, no symptoms Cont below Has f/u with cardiology scheduled -     isosorbide mononitrate (IMDUR) 30 MG 24 hr tablet; Take 1 tablet (30 mg total) by mouth 2 (two) times daily. -     clopidogrel  (PLAVIX) 75 MG tablet; TAKE 1 TABLET BY MOUTH EVERY DAY  Mixed stress and urge urinary incontinence Cont below Son thinks it is helping with symptoms, notices when she doesn't have it -     oxybutynin (DITROPAN-XL) 10 MG 24 hr tablet; Take 1 tablet (10 mg total) by mouth at bedtime.  Anxiety and tremor Continue below, helping with symptoms -     busPIRone (BUSPAR) 15 MG tablet; Take 1 tablet (15 mg total) by mouth 2 (two) times daily.  Follow up plan: Return in about 3 months (around 01/30/2017). Assunta Found, MD [place]

## 2016-10-31 ENCOUNTER — Other Ambulatory Visit: Payer: Self-pay | Admitting: *Deleted

## 2016-10-31 DIAGNOSIS — R7309 Other abnormal glucose: Secondary | ICD-10-CM

## 2016-10-31 LAB — BMP8+EGFR
BUN/Creatinine Ratio: 15 (ref 12–28)
BUN: 15 mg/dL (ref 8–27)
CALCIUM: 9.4 mg/dL (ref 8.7–10.3)
CO2: 22 mmol/L (ref 18–29)
Chloride: 103 mmol/L (ref 96–106)
Creatinine, Ser: 0.98 mg/dL (ref 0.57–1.00)
GFR calc Af Amer: 61 mL/min/{1.73_m2} (ref >59)
GFR, EST NON AFRICAN AMERICAN: 53 mL/min/{1.73_m2} — AB (ref >59)
Glucose: 111 mg/dL — ABNORMAL HIGH (ref 65–99)
POTASSIUM: 4.9 mmol/L (ref 3.5–5.2)
Sodium: 142 mmol/L (ref 134–144)

## 2016-11-03 ENCOUNTER — Other Ambulatory Visit: Payer: Self-pay | Admitting: Pharmacist

## 2016-11-03 DIAGNOSIS — M810 Age-related osteoporosis without current pathological fracture: Secondary | ICD-10-CM

## 2016-11-04 DIAGNOSIS — I10 Essential (primary) hypertension: Secondary | ICD-10-CM | POA: Diagnosis not present

## 2016-11-04 DIAGNOSIS — F419 Anxiety disorder, unspecified: Secondary | ICD-10-CM | POA: Diagnosis not present

## 2016-11-04 DIAGNOSIS — E782 Mixed hyperlipidemia: Secondary | ICD-10-CM | POA: Diagnosis not present

## 2016-11-04 DIAGNOSIS — F0390 Unspecified dementia without behavioral disturbance: Secondary | ICD-10-CM | POA: Diagnosis not present

## 2016-11-04 DIAGNOSIS — R2689 Other abnormalities of gait and mobility: Secondary | ICD-10-CM | POA: Diagnosis not present

## 2016-11-04 DIAGNOSIS — J441 Chronic obstructive pulmonary disease with (acute) exacerbation: Secondary | ICD-10-CM | POA: Diagnosis not present

## 2016-11-04 DIAGNOSIS — Z7902 Long term (current) use of antithrombotics/antiplatelets: Secondary | ICD-10-CM | POA: Diagnosis not present

## 2016-11-04 DIAGNOSIS — M6281 Muscle weakness (generalized): Secondary | ICD-10-CM | POA: Diagnosis not present

## 2016-11-04 DIAGNOSIS — R262 Difficulty in walking, not elsewhere classified: Secondary | ICD-10-CM | POA: Diagnosis not present

## 2016-11-11 ENCOUNTER — Other Ambulatory Visit: Payer: Medicare HMO

## 2016-11-13 ENCOUNTER — Ambulatory Visit: Payer: Medicare HMO

## 2016-11-19 DIAGNOSIS — R262 Difficulty in walking, not elsewhere classified: Secondary | ICD-10-CM | POA: Diagnosis not present

## 2016-11-19 DIAGNOSIS — Z7902 Long term (current) use of antithrombotics/antiplatelets: Secondary | ICD-10-CM | POA: Diagnosis not present

## 2016-11-19 DIAGNOSIS — E782 Mixed hyperlipidemia: Secondary | ICD-10-CM | POA: Diagnosis not present

## 2016-11-19 DIAGNOSIS — J441 Chronic obstructive pulmonary disease with (acute) exacerbation: Secondary | ICD-10-CM | POA: Diagnosis not present

## 2016-11-19 DIAGNOSIS — R2689 Other abnormalities of gait and mobility: Secondary | ICD-10-CM | POA: Diagnosis not present

## 2016-11-19 DIAGNOSIS — F419 Anxiety disorder, unspecified: Secondary | ICD-10-CM | POA: Diagnosis not present

## 2016-11-19 DIAGNOSIS — F0390 Unspecified dementia without behavioral disturbance: Secondary | ICD-10-CM | POA: Diagnosis not present

## 2016-11-19 DIAGNOSIS — I10 Essential (primary) hypertension: Secondary | ICD-10-CM | POA: Diagnosis not present

## 2016-11-19 DIAGNOSIS — M6281 Muscle weakness (generalized): Secondary | ICD-10-CM | POA: Diagnosis not present

## 2016-11-24 ENCOUNTER — Other Ambulatory Visit: Payer: Self-pay | Admitting: Pediatrics

## 2016-11-24 DIAGNOSIS — M810 Age-related osteoporosis without current pathological fracture: Secondary | ICD-10-CM

## 2016-11-24 NOTE — Progress Notes (Signed)
Patient is coming in at 2:30 Thursday 11/27/16

## 2016-11-25 DIAGNOSIS — R32 Unspecified urinary incontinence: Secondary | ICD-10-CM | POA: Diagnosis not present

## 2016-11-27 ENCOUNTER — Other Ambulatory Visit: Payer: Medicare HMO

## 2016-12-04 DIAGNOSIS — R262 Difficulty in walking, not elsewhere classified: Secondary | ICD-10-CM | POA: Diagnosis not present

## 2016-12-04 DIAGNOSIS — F419 Anxiety disorder, unspecified: Secondary | ICD-10-CM | POA: Diagnosis not present

## 2016-12-04 DIAGNOSIS — Z7902 Long term (current) use of antithrombotics/antiplatelets: Secondary | ICD-10-CM | POA: Diagnosis not present

## 2016-12-04 DIAGNOSIS — R2689 Other abnormalities of gait and mobility: Secondary | ICD-10-CM | POA: Diagnosis not present

## 2016-12-04 DIAGNOSIS — J441 Chronic obstructive pulmonary disease with (acute) exacerbation: Secondary | ICD-10-CM | POA: Diagnosis not present

## 2016-12-04 DIAGNOSIS — E782 Mixed hyperlipidemia: Secondary | ICD-10-CM | POA: Diagnosis not present

## 2016-12-04 DIAGNOSIS — M6281 Muscle weakness (generalized): Secondary | ICD-10-CM | POA: Diagnosis not present

## 2016-12-04 DIAGNOSIS — I10 Essential (primary) hypertension: Secondary | ICD-10-CM | POA: Diagnosis not present

## 2016-12-04 DIAGNOSIS — F0390 Unspecified dementia without behavioral disturbance: Secondary | ICD-10-CM | POA: Diagnosis not present

## 2016-12-30 ENCOUNTER — Ambulatory Visit (INDEPENDENT_AMBULATORY_CARE_PROVIDER_SITE_OTHER): Payer: Medicare HMO | Admitting: Family Medicine

## 2016-12-30 ENCOUNTER — Encounter: Payer: Self-pay | Admitting: Family Medicine

## 2016-12-30 VITALS — BP 108/72 | HR 93 | Temp 99.5°F | Ht 62.0 in | Wt 136.0 lb

## 2016-12-30 DIAGNOSIS — J40 Bronchitis, not specified as acute or chronic: Secondary | ICD-10-CM | POA: Diagnosis not present

## 2016-12-30 DIAGNOSIS — R350 Frequency of micturition: Secondary | ICD-10-CM | POA: Diagnosis not present

## 2016-12-30 DIAGNOSIS — N3281 Overactive bladder: Secondary | ICD-10-CM | POA: Diagnosis not present

## 2016-12-30 LAB — URINALYSIS, COMPLETE
BILIRUBIN UA: NEGATIVE
GLUCOSE, UA: NEGATIVE
Nitrite, UA: POSITIVE — AB
SPEC GRAV UA: 1.015 (ref 1.005–1.030)
UUROB: 0.2 mg/dL (ref 0.2–1.0)
pH, UA: 5.5 (ref 5.0–7.5)

## 2016-12-30 LAB — MICROSCOPIC EXAMINATION
Epithelial Cells (non renal): >10 /hpf — AB (ref 0–10)
RBC, UA: >30 /hpf — AB (ref 0–?)

## 2016-12-30 MED ORDER — BENZONATATE 200 MG PO CAPS: 200.0000 mg | ORAL_CAPSULE | Freq: Two times a day (BID) | ORAL | 0 refills | Status: DC | PRN

## 2016-12-30 MED ORDER — DOXYCYCLINE MONOHYDRATE 100 MG PO TABS: 100.0000 mg | ORAL_TABLET | Freq: Two times a day (BID) | ORAL | 0 refills | Status: DC

## 2016-12-30 NOTE — Progress Notes (Signed)
BP 108/72   Pulse 93   Temp 99.5 F (37.5 C) (Oral)   Ht  (1.575 m)   Wt 136 lb (61.7 kg)   BMI 24.87 kg/m    Subjective:    Patient ID: Oliviya Gilkison, female    DOB: [birth year], 83 y.o.   MRN: [id number]  HPI: Shanen Norris is a 83 y.o. female presenting on 12/30/2016 for Cough (productive, x 5 days) and Wants to have urine checked (no symptoms of UTI, just wants checked while here, explained insurance may not pay)   HPI Cough and chest congestion Patient has been having cough and chest congestion that's been going on for the past 5 days and has been increasing. Her cough is nonproductive. She denies any fevers or chills or shortness of breath. She thinks she may have had some wheezing at night. Her cough does worsen at night and has been keeping her up. She denies any chest pain.  Urinary frequency Patient has been having urinary frequency and gets very frequent UTIs. Besides the frequency of the overactive bladder she does not have any major symptoms like she has had previously but just wants to get her urine tested anyways. She denies any flank pain or abdominal pain.  Relevant past medical, surgical, family and social history reviewed and updated as indicated. Interim medical history since our last visit reviewed. Allergies and medications reviewed and updated.  Review of Systems  Constitutional: Negative for chills and fever.  HENT: Positive for congestion, postnasal drip, rhinorrhea, sinus pressure, sneezing and sore throat. Negative for ear discharge and ear pain.   Eyes: Negative for pain, redness and visual disturbance.  Respiratory: Positive for cough. Negative for chest tightness and shortness of breath.   Cardiovascular: Negative for chest pain and leg swelling.  Genitourinary: Positive for frequency. Negative for difficulty urinating, dysuria, flank pain and hematuria.  Musculoskeletal: Negative for back pain and gait problem.  Skin: Negative for rash.    Neurological: Negative for light-headedness and headaches.  Psychiatric/Behavioral: Negative for agitation and behavioral problems.  All other systems reviewed and are negative.   Per HPI unless specifically indicated above      Objective:    BP 108/72   Pulse 93   Temp 99.5 F (37.5 C) (Oral)   Ht  (1.575 m)   Wt 136 lb (61.7 kg)   BMI 24.87 kg/m   Wt Readings from Last 3 Encounters:  12/30/16 136 lb (61.7 kg)  10/30/16 147 lb 12.8 oz (67 kg)  09/08/16 162 lb 6.4 oz (73.7 kg)    Physical Exam  Constitutional: She is oriented to person, place, and time. She appears well-developed and well-nourished. No distress.  HENT:  Right Ear: Tympanic membrane, external ear and ear canal normal.  Left Ear: Tympanic membrane, external ear and ear canal normal.  Nose: Mucosal edema and rhinorrhea present. No epistaxis. Right sinus exhibits no maxillary sinus tenderness and no frontal sinus tenderness. Left sinus exhibits no maxillary sinus tenderness and no frontal sinus tenderness.  Mouth/Throat: Uvula is midline and mucous membranes are normal. Posterior oropharyngeal edema and posterior oropharyngeal erythema present. No oropharyngeal exudate or tonsillar abscesses.  Eyes: Conjunctivae are normal.  Cardiovascular: Normal rate, regular rhythm, normal heart sounds and intact distal pulses.   No murmur heard. Pulmonary/Chest: Effort normal and breath sounds normal. No respiratory distress. She has no wheezes. She has no rales.  Abdominal: Soft. Bowel sounds are normal. She exhibits no distension. There is no tenderness. There  is no rigidity, no rebound, no guarding and no CVA tenderness.  Musculoskeletal: Normal range of motion. She exhibits no edema or tenderness.  Neurological: She is alert and oriented to person, place, and time. Coordination normal.  Skin: Skin is warm and dry. No rash noted. She is not diaphoretic.  Psychiatric: She has a normal mood and affect. Her behavior is  normal.  Vitals reviewed.   Urinalysis: Pending    Assessment & Plan:   Problem List Items Addressed This Visit      Genitourinary   OAB (overactive bladder)   Relevant Orders   Urine culture   Urinalysis, Complete (Completed)    Other Visit Diagnoses    Bronchitis    -  Primary   Relevant Medications   doxycycline (ADOXA) 100 MG tablet   benzonatate (TESSALON) 200 MG capsule     her urinalysis is pending but we are treating her bronchitis with doxycycliich should cover most urinary infections.  Follow up plan: Return if symptoms worsen or fail to improve.  Counseling provided for all of the vaccine components Orders Placed This Encounter  Procedures  . Urine culture  . Urinalysis, Complete    Arville Care, MD [facility] 12/30/2016, 4:45 PM

## 2017-01-01 LAB — URINE CULTURE

## 2017-01-22 ENCOUNTER — Encounter: Payer: Self-pay | Admitting: Cardiology

## 2017-01-22 ENCOUNTER — Ambulatory Visit (INDEPENDENT_AMBULATORY_CARE_PROVIDER_SITE_OTHER): Payer: Medicare HMO | Admitting: Cardiology

## 2017-01-22 VITALS — BP 126/70 | HR 62 | Ht 62.0 in | Wt 134.2 lb

## 2017-01-22 DIAGNOSIS — I2510 Atherosclerotic heart disease of native coronary artery without angina pectoris: Secondary | ICD-10-CM | POA: Diagnosis not present

## 2017-01-22 DIAGNOSIS — I10 Essential (primary) hypertension: Secondary | ICD-10-CM | POA: Diagnosis not present

## 2017-01-22 NOTE — Patient Instructions (Signed)

## 2017-01-22 NOTE — Progress Notes (Signed)
Cardiology Office Note  Date: 01/22/2017   ID: Suzanne MillinKatherine Fauver, DOB 01/12/1931, MRN [id number]  PCP: Johna SheriffVincent, Carol L, MD  Primary Cardiologist: Nona DellSamuel Taniela Feltus, MD   Chief Complaint  Patient presents with  . Coronary Artery Disease    History of Present Illness: [name] is an 83 y.o. female last seen in July 2017. She is here today with one of her sons. She reports "up-and-down" days, no significant progressive angina symptoms on medical therapy. She is now using a walker to ambulate with increased unsteadiness.  I reviewed her medications. Cardiac regimen includes Plavix, Lipitor, Imdur, lisinopril, and as needed nitroglycerin.  I personally reviewed her ECG today which shows normal sinus rhythm.  Past Medical History:  Diagnosis Date  . Anxiety   . COPD (chronic obstructive pulmonary disease) (HCC)   . Coronary atherosclerosis of native coronary artery    DES LAD and BMS LAD 8/08  . Esophageal stricture   . Essential hypertension, benign   . GERD (gastroesophageal reflux disease)   . MI (myocardial infarction) (HCC) 2008  . Mixed hyperlipidemia   . Osteoporosis   . Pelvic fracture (HCC) 08/2014  . Resting tremor   . Rickets     Current Outpatient Prescriptions  Medication Sig Dispense Refill  . atorvastatin (LIPITOR) 20 MG tablet TAKE 1 TABLET BY MOUTH DAILY AT 6 PM. 90 tablet 0  . benzonatate (TESSALON) 200 MG capsule Take 1 capsule (200 mg total) by mouth 2 (two) times daily as needed for cough. 20 capsule 0  . busPIRone (BUSPAR) 15 MG tablet Take 1 tablet (15 mg total) by mouth 2 (two) times daily. 180 tablet 0  . CITRACAL/VITAMIN D 250-200 MG-UNIT TABS TAKE 2 TABLETS BY MOUTH EVERY DAY 180 each 0  . clopidogrel (PLAVIX) 75 MG tablet TAKE 1 TABLET BY MOUTH EVERY DAY 30 tablet 3  . isosorbide mononitrate (IMDUR) 30 MG 24 hr tablet Take 1 tablet (30 mg total) by mouth 2 (two) times daily. 180 tablet 3  . lisinopril (PRINIVIL,ZESTRIL) 5 MG tablet Take 1  tablet (5 mg total) by mouth daily. 30 tablet 4  . nitroGLYCERIN (NITROSTAT) 0.4 MG SL tablet Place 1 tablet (0.4 mg total) under the tongue every 5 (five) minutes as needed. 25 tablet 3  . oxybutynin (DITROPAN-XL) 10 MG 24 hr tablet Take 1 tablet (10 mg total) by mouth at bedtime. 90 tablet 0   No current facility-administered medications for this visit.    Allergies:  Azithromycin; Cephalexin; Ciprofloxacin; Codeine; Contrast media [iodinated diagnostic agents]; Latex; Nitrofuran derivatives; Nsaids; Nutritional supplements; Penicillins; Phenazopyridine; Quinolones; Sulfa antibiotics; Toviaz [fesoterodine fumarate er]; and Trimethoprim   Social History: The patient  reports that she has never smoked. She has never used smokeless tobacco. She reports that she does not drink alcohol or use drugs.   ROS:  Please see the history of present illness. Otherwise, complete review of systems is positive for tremor.  All other systems are reviewed and negative.   Physical Exam: VS:  BP 126/70   Pulse 62   Ht 5\' 2"  (1.575 m)   Wt 134 lb 3.2 oz (60.9 kg)   SpO2 97%   BMI 24.55 kg/m , BMI Body mass index is 24.55 kg/m.  Wt Readings from Last 3 Encounters:  01/22/17 134 lb 3.2 oz (60.9 kg)  12/30/16 136 lb (61.7 kg)  10/30/16 147 lb 12.8 oz (67 kg)    Elderly woman in no distress. Using a walker today.Marland Kitchen. HEENT: Conjunctiva and lids  normal, oropharynx clear.  Neck: Supple, no elevated jugular venous pressure, no bruits.  Lungs: Clear to auscultation, nonlabored.  Cardiac: Regular rate and rhythm, no S3 gallop.  Abdomen: Soft, nontender, no bruits.  Skin: Warm and dry.  Extremities: No significant peripheral edema   ECG: I personally reviewed the tracing from 03/27/2016 which showed normal sinus rhythm.  Recent Labwork: 03/07/2016: Platelets 254; TSH 2.320 04/15/2016: ALT 10; AST 17 10/30/2016: BUN 15; Creatinine, Ser 0.98; Potassium 4.9; Sodium 142   Other Studies Reviewed  Today:  Cardiac catheterization 06/20/2011: HEMODYNAMIC DATA:Aortic pressure is 139/60 with a mean of 91 mmHg. Left ventricular pressure is 137 with EDP of 10 mmHg.  ANGIOGRAPHIC DATA:The left coronary artery arises and distributes normally.The left main coronary artery is normal.  The left anterior descending artery demonstrates that the proximal and mid to distal vessel stents are widely patent.Between the stents in the mid vessel, there is diffuse 30% narrowing.There is a small first diagonal branch that has a focal 70% stenosis.This is less than 1.5 mm in diameter.  Left circumflex coronary artery distributes normally.It gives rise to 3 marginal branches.In the midvessel, there is diffuse 40% disease in a segmental fashion.  The right coronary arises and distributes normally.There is segmental 30% disease in the midvessel.  The left ventricular angiography performed in the RAO view demonstrates normal left ventricular size and contractility with normal ejection fraction of 60%.  FINAL IMPRESSION: 1. Single-vessel obstructive coronary artery disease with a modest lesion involving a small diagonal branch.The stents in the proximal and mid to distal left anterior descending are still patent.Compared to her prior cardiac catheterization, there is no significant change 2. Normal left ventricular function.  Assessment and Plan:  1. CAD status post DES and BMS interventions to the LAD as noted above. She is clinically stable without progressive angina on medical therapy, and our plan will be to continue with observation. Her ECG is normal today.  2. Essential hypertension, systolic blood pressure in the 120s today. No changes were made in current regimen.  Current medicines were reviewed with the patient today.   Orders Placed This Encounter  Procedures  . EKG 12-Lead    Disposition: Follow-up in 6 months.  Signed, Jonelle Sidle, MD, [facility] 01/22/2017 3:29 PM    [facility] at [facility] [address] [place], [place], [place] [address] Phone: [phone]; Fax: [phone]

## 2017-01-28 ENCOUNTER — Other Ambulatory Visit: Payer: Self-pay | Admitting: Pediatrics

## 2017-01-28 DIAGNOSIS — N3946 Mixed incontinence: Secondary | ICD-10-CM

## 2017-01-28 DIAGNOSIS — E785 Hyperlipidemia, unspecified: Secondary | ICD-10-CM

## 2017-01-30 ENCOUNTER — Ambulatory Visit: Payer: Medicare HMO | Admitting: Pediatrics

## 2017-02-05 ENCOUNTER — Encounter: Payer: Self-pay | Admitting: Pediatrics

## 2017-02-05 ENCOUNTER — Ambulatory Visit (INDEPENDENT_AMBULATORY_CARE_PROVIDER_SITE_OTHER): Payer: Medicare HMO | Admitting: Pediatrics

## 2017-02-05 VITALS — BP 122/60 | HR 59 | Temp 97.8°F | Ht 62.0 in | Wt 134.6 lb

## 2017-02-05 DIAGNOSIS — F0390 Unspecified dementia without behavioral disturbance: Secondary | ICD-10-CM

## 2017-02-05 DIAGNOSIS — I10 Essential (primary) hypertension: Secondary | ICD-10-CM | POA: Diagnosis not present

## 2017-02-05 DIAGNOSIS — J449 Chronic obstructive pulmonary disease, unspecified: Secondary | ICD-10-CM

## 2017-02-05 MED ORDER — NITROGLYCERIN 0.4 MG SL SUBL: 0.4000 mg | SUBLINGUAL_TABLET | SUBLINGUAL | 3 refills | Status: DC | PRN

## 2017-02-05 MED ORDER — DONEPEZIL HCL 5 MG PO TABS: 5.0000 mg | ORAL_TABLET | Freq: Every day | ORAL | 1 refills | Status: DC

## 2017-02-05 NOTE — Progress Notes (Signed)
  Subjective:   Patient ID: Suzanne MillinKatherine [name], female    DOB: 1931-06-10, 83 y.o.   MRN: [id number] CC: Follow-up (3 month) multiple med problems HPI: [name] is a 83 y.o. female presenting for Follow-up (3 month)  Here today with son Billey GoslingCharlie Has family with her 24h a day Needs help getting dressed due to balance, family preparing meals Does stumble sometimes, tends to shuffle her feet Recent stumble to knees, no injuries, feeling well now  Daughter there during day M-W, son Glorian MantleCharlie Thurs, Fri 7am to 3pm may need additional sitter at home M-W Son interested in options  HTN: no CP, no SOB  Walking regularly around house per son Uses walker Didn't enjoy PT when had it last, tried not to participate  Ongoing memory problems Son interested in trying medication Has not been on medication in the past  MMSE - Mini Mental State Exam 02/05/2017 03/21/2015  Not completed: - Unable to complete;Refused  Orientation to time 0 0  Orientation to Place 0 2  Registration 3 3  Attention/ Calculation 0 0  Recall 0 1  Language- name 2 objects 2 2  Language- repeat 1 1  Language- follow 3 step command 1 3  Language- read & follow direction 0 0  Write a sentence 0 0  Copy design 0 0  Total score 7 12     Relevant past medical, surgical, family and social history reviewed. Allergies and medications reviewed and updated. History  Smoking Status  . Never Smoker  Smokeless Tobacco  . Never Used   ROS: Per HPI   Objective:    BP 122/60   Pulse (!) 59   Temp 97.8 F (36.6 C) (Oral)   Ht 5\' 2"  (1.575 m)   Wt 134 lb 9.6 oz (61.1 kg)   BMI 24.62 kg/m   Wt Readings from Last 3 Encounters:  02/05/17 134 lb 9.6 oz (61.1 kg)  01/22/17 134 lb 3.2 oz (60.9 kg)  12/30/16 136 lb (61.7 kg)    Gen: NAD, alert, cooperative with exam, NCAT EYES: EOMI, no conjunctival injection, or no icterus ENT:   OP without erythema CV: NRRR, normal S1/S2, no murmur Resp: CTABL, no wheezes, normal  WOB Abd: +BS, soft, mildly tender with deep palpation, ND. no guarding or organomegaly Ext: No edema, warm Neuro: Alert and oriented to person MSK: normal muscle bulk Psych: normal affect, good eye contact, answers simple questions in short phrases appropriately  Assessment & Plan:  [name] was seen today for follow-up multiple med problems.  Diagnoses and all orders for this visit:  Dementia without behavioral disturbance, unspecified dementia type MMSE 7, oriented to person Start below Needs assistance with ADLs, needs 24h supervision, currently family providing Son asking about options, says paying for sitter will be difficult Will get him more info -     donepezil (ARICEPT) 5 MG tablet; Take 1 tablet (5 mg total) by mouth at bedtime.  Essential hypertension, benign Well controlled, cont current meds Labs next visit  COPD  Stable, no symptoms. Not on controller med   Follow up plan: Return in about 3 months (around 05/08/2017). Rex Krasarol Vincent, MD [facility]

## 2017-02-11 ENCOUNTER — Other Ambulatory Visit: Payer: Self-pay | Admitting: *Deleted

## 2017-02-11 ENCOUNTER — Other Ambulatory Visit: Payer: Self-pay | Admitting: Pediatrics

## 2017-02-11 ENCOUNTER — Other Ambulatory Visit: Payer: Medicare HMO

## 2017-02-11 ENCOUNTER — Telehealth: Payer: Self-pay | Admitting: Pediatrics

## 2017-02-11 ENCOUNTER — Telehealth: Payer: Self-pay | Admitting: *Deleted

## 2017-02-11 DIAGNOSIS — R350 Frequency of micturition: Secondary | ICD-10-CM

## 2017-02-11 LAB — MICROSCOPIC EXAMINATION: RBC, UA: >30 /hpf — AB (ref 0–?)

## 2017-02-11 LAB — URINALYSIS, ROUTINE W REFLEX MICROSCOPIC
BILIRUBIN UA: NEGATIVE
GLUCOSE, UA: NEGATIVE
Ketones, UA: NEGATIVE
Nitrite, UA: POSITIVE — AB
Specific Gravity, UA: 1.025 (ref 1.005–1.030)
UUROB: 0.2 mg/dL (ref 0.2–1.0)
pH, UA: 7 (ref 5.0–7.5)

## 2017-02-11 MED ORDER — DOXYCYCLINE HYCLATE 100 MG PO TABS: 100.0000 mg | ORAL_TABLET | Freq: Two times a day (BID) | ORAL | 0 refills | Status: DC

## 2017-02-11 NOTE — Telephone Encounter (Signed)
Spoke with patient's son.  Son aware that patient does have a uti and antibiotic has been sent to pharmacy.  Appt canceled for tomorrow 02/12/17 per Dr. Oswaldo DoneVincent

## 2017-02-11 NOTE — Progress Notes (Unsigned)
Sent in doxycycl[facility] for cystitis, [facility] x [facility]

## 2017-02-11 NOTE — Progress Notes (Signed)
Pt is having urinary frequency with weakness App scheduled for 02/12/2017  Pt's son will bring in specimen

## 2017-02-11 NOTE — Telephone Encounter (Signed)
Spoke with pt's son  Pt is having weakness and urinary urgency appt scheduled for 02/12/2017 but son will bring in specimen today

## 2017-02-12 ENCOUNTER — Ambulatory Visit: Payer: Medicare HMO | Admitting: Pediatrics

## 2017-02-14 LAB — URINE CULTURE

## 2017-02-16 ENCOUNTER — Other Ambulatory Visit: Payer: Self-pay | Admitting: Pediatrics

## 2017-02-16 ENCOUNTER — Telehealth: Payer: Self-pay | Admitting: Pediatrics

## 2017-02-16 MED ORDER — FOSFOMYCIN TROMETHAMINE 3 G PO PACK: 3.0000 g | PACK | Freq: Once | ORAL | 0 refills | Status: AC

## 2017-02-16 NOTE — Telephone Encounter (Signed)
Per pt's son pt is still having sxs of frequency and urgency with urination Please advise

## 2017-02-16 NOTE — Telephone Encounter (Signed)
Does he know what her reaction to nitrofurantoin is?  Can call in fosfomycin 3g, take once, mix with water. Not on her allergy list

## 2017-02-16 NOTE — Telephone Encounter (Signed)
Dr Oswaldo DoneVincent spoke with pt's son

## 2017-02-16 NOTE — Progress Notes (Signed)
Culture with bacteria resistant to doxycycline that she was started on. Tried to reach pt to talk about symptoms. Due to multiple allergies, limited options for antibiotics. Could try fosfomycin if still with symptoms.

## 2017-02-18 ENCOUNTER — Other Ambulatory Visit: Payer: Self-pay | Admitting: Pediatrics

## 2017-02-18 DIAGNOSIS — I2510 Atherosclerotic heart disease of native coronary artery without angina pectoris: Secondary | ICD-10-CM

## 2017-03-17 ENCOUNTER — Telehealth: Payer: Self-pay | Admitting: Pediatrics

## 2017-03-17 ENCOUNTER — Other Ambulatory Visit: Payer: Self-pay | Admitting: Pediatrics

## 2017-03-17 DIAGNOSIS — I10 Essential (primary) hypertension: Secondary | ICD-10-CM

## 2017-03-17 NOTE — Telephone Encounter (Signed)
Please  write and I will sign. Thanks, WS 

## 2017-03-18 ENCOUNTER — Other Ambulatory Visit: Payer: Self-pay | Admitting: Family Medicine

## 2017-03-18 ENCOUNTER — Other Ambulatory Visit: Payer: Medicare HMO

## 2017-03-18 DIAGNOSIS — R350 Frequency of micturition: Secondary | ICD-10-CM

## 2017-03-18 DIAGNOSIS — N3946 Mixed incontinence: Secondary | ICD-10-CM

## 2017-03-18 DIAGNOSIS — N3281 Overactive bladder: Secondary | ICD-10-CM | POA: Diagnosis not present

## 2017-03-18 LAB — URINALYSIS, COMPLETE
Bilirubin, UA: NEGATIVE
GLUCOSE, UA: NEGATIVE
KETONES UA: NEGATIVE
NITRITE UA: NEGATIVE
Protein, UA: NEGATIVE
SPEC GRAV UA: 1.015 (ref 1.005–1.030)
UUROB: 0.2 mg/dL (ref 0.2–1.0)
pH, UA: 7 (ref 5.0–7.5)

## 2017-03-18 LAB — MICROSCOPIC EXAMINATION

## 2017-03-18 NOTE — Telephone Encounter (Signed)
Orders have been placed.

## 2017-03-20 ENCOUNTER — Telehealth: Payer: Self-pay | Admitting: Pediatrics

## 2017-03-20 LAB — URINE CULTURE

## 2017-03-23 ENCOUNTER — Telehealth: Payer: Self-pay | Admitting: Family Medicine

## 2017-03-23 DIAGNOSIS — R350 Frequency of micturition: Secondary | ICD-10-CM

## 2017-03-23 NOTE — Telephone Encounter (Signed)
Please contact the son (If on HIPPA). Let him know culture was inconclusive. If sx continue will need to recollect the urine.

## 2017-03-23 NOTE — Telephone Encounter (Signed)
Paula ComptonKarla, It think that Dr Darlyn ReadStacks has addressed the culture repot - can you follow up

## 2017-03-23 NOTE — Telephone Encounter (Signed)
lmtcb on son's voicemail jkp 7/23

## 2017-03-23 NOTE — Telephone Encounter (Signed)
Patient's son is aware of urine culture results.  She is still having frequent urination.  He will bring her in this week to leave a urine sample for urine culture.

## 2017-03-23 NOTE — Telephone Encounter (Signed)
Patient was informed of lab results on 03/19/17

## 2017-03-24 ENCOUNTER — Other Ambulatory Visit: Payer: Medicare HMO

## 2017-03-26 ENCOUNTER — Other Ambulatory Visit: Payer: Medicare HMO

## 2017-03-27 DIAGNOSIS — R32 Unspecified urinary incontinence: Secondary | ICD-10-CM | POA: Diagnosis not present

## 2017-04-15 ENCOUNTER — Other Ambulatory Visit: Payer: Self-pay | Admitting: Family

## 2017-04-15 DIAGNOSIS — I2510 Atherosclerotic heart disease of native coronary artery without angina pectoris: Secondary | ICD-10-CM

## 2017-04-16 ENCOUNTER — Encounter (HOSPITAL_COMMUNITY): Payer: Commercial Managed Care - HMO

## 2017-04-24 ENCOUNTER — Encounter (HOSPITAL_COMMUNITY): Payer: Commercial Managed Care - HMO

## 2017-04-28 ENCOUNTER — Encounter: Payer: Self-pay | Admitting: Physician Assistant

## 2017-04-28 ENCOUNTER — Ambulatory Visit (INDEPENDENT_AMBULATORY_CARE_PROVIDER_SITE_OTHER): Payer: Medicare HMO | Admitting: Physician Assistant

## 2017-04-28 VITALS — BP 123/67 | HR 66 | Temp 97.5°F | Ht 62.0 in | Wt 133.0 lb

## 2017-04-28 DIAGNOSIS — N3001 Acute cystitis with hematuria: Secondary | ICD-10-CM

## 2017-04-28 DIAGNOSIS — R300 Dysuria: Secondary | ICD-10-CM

## 2017-04-28 DIAGNOSIS — B372 Candidiasis of skin and nail: Secondary | ICD-10-CM

## 2017-04-28 LAB — URINALYSIS
BILIRUBIN UA: NEGATIVE
GLUCOSE, UA: NEGATIVE
KETONES UA: NEGATIVE
Nitrite, UA: NEGATIVE
SPEC GRAV UA: 1.015 (ref 1.005–1.030)
UUROB: 0.2 mg/dL (ref 0.2–1.0)
pH, UA: 5.5 (ref 5.0–7.5)

## 2017-04-28 MED ORDER — FOSFOMYCIN TROMETHAMINE 3 G PO PACK: 3.0000 g | PACK | Freq: Once | ORAL | 0 refills | Status: AC

## 2017-04-28 MED ORDER — NYSTATIN 100000 UNIT/GM EX CREA: 1.0000 "application " | TOPICAL_CREAM | Freq: Two times a day (BID) | CUTANEOUS | 2 refills | Status: AC

## 2017-04-28 NOTE — Patient Instructions (Signed)
Intertrigo Intertrigo is skin irritation (inflammation) that happens in warm, moist areas of the body. The irritation can cause a rash and make skin raw and itchy. The rash is usually pink or red. It happens mostly between folds of skin or where skin rubs together, such as:  Toes.  Armpits.  Groin.  Belly.  Breasts.  Buttocks.  This condition is not passed from person to person (is not contagious). Follow these instructions at home:  Keep the affected area clean and dry.  Do not scratch your skin.  Stay cool as much as possible. Use an air conditioner or fan, if you can.  Apply over-the-counter and prescription medicines only as told by your doctor.  If you were prescribed an antibiotic medicine, use it as told by your doctor. Do not stop using the antibiotic even if your condition starts to get better.  Keep all follow-up visits as told by your doctor. This is important. How is this prevented?  Stay at a healthy weight.  Keep your feet dry. This is very important if you have diabetes. Wear cotton or wool socks.  Take care of and protect the skin in your groin and butt area as told by your doctor.  Do not wear tight clothes. Wear clothes that: ? Are loose. ? Take away moisture from your body. ? Are made of cotton.  Wear a bra that gives good support, if needed.  Shower and dry yourself fully after being active.  Keep your blood sugar under control if you have diabetes. Contact a doctor if:  Your symptoms do not get better with treatment.  Your symptoms get worse or they spread.  You notice more redness and warmth.  You have a fever. This information is not intended to replace advice given to you by your health care provider. Make sure you discuss any questions you have with your health care provider. Document Released: 09/20/2010 Document Revised: 01/24/2016 Document Reviewed: 02/19/2015 Elsevier Interactive Patient Education  2018 Elsevier Inc.  

## 2017-04-28 NOTE — Progress Notes (Signed)
BP 123/67   Pulse 66   Temp (!) 97.5 F (36.4 C)   Ht 5\' 2"  (1.575 m)   Wt 133 lb (60.3 kg)   BMI 24.33 kg/m    Subjective:    Patient ID: [name], female    DOB: 17-Nov-1930, 83 y.o.   MRN: [id number]  HPI: [name] is a 83 y.o. female presenting on 04/28/2017 for Urinary Tract Infection (rash in peritoneal area)  This patient has had several days of dysuria, frequency.There is also pain over the bladder in the suprapubic region, no back pain. Denies leakage or hematuria.  Denies fever or chills. No pain in flank area. The patient has known recurrent urinary tract infections. Her last treatment was fosfomycin back in June. She does have a positive urinalysis with 1+ blood 1+ protein 3+ leuks. She did not have enough urine to perform a microscopic exam. We will send what is left over for a urine culture.  She also reports a significant rash in the right fold of her leg and thigh. She does have an overlapping daily. She is having rash on her labia also. She does wear adult diapers.   Relevant past medical, surgical, family and social history reviewed and updated as indicated. Allergies and medications reviewed and updated.  Past Medical History:  Diagnosis Date  . Anxiety   . COPD (chronic obstructive pulmonary disease) (HCC)   . Coronary atherosclerosis of native coronary artery    DES LAD and BMS LAD 8/08  . Esophageal stricture   . Essential hypertension, benign   . GERD (gastroesophageal reflux disease)   . MI (myocardial infarction) (HCC) 2008  . Mixed hyperlipidemia   . Osteoporosis   . Pelvic fracture (HCC) 08/2014  . Resting tremor   . Rickets     Past Surgical History:  Procedure Laterality Date  . APPENDECTOMY    . Bilateral cataract surgery    . CARDIAC CATHETERIZATION    . CHOLECYSTECTOMY    . CORONARY ANGIOPLASTY WITH STENT PLACEMENT    . Left Sacroiliac Joint Injection  11/27/11   DR. O'TOOLE  . Left Trochanteric Bursa Injection  11/27/11   DR. O'TOOLE  . VEIN LIGATION AND STRIPPING    . YAG LASER APPLICATION  09/06/2012   Procedure: YAG LASER APPLICATION;  Surgeon: Susa Simmonds, MD;  Location: [facility];  Service: Ophthalmology;  Laterality: Left;    Review of Systems  Constitutional: Negative.   HENT: Negative.   Eyes: Negative.   Respiratory: Negative.   Gastrointestinal: Negative.   Genitourinary: Positive for difficulty urinating, dysuria and urgency. Negative for flank pain.  Skin: Positive for color change and rash.    Allergies as of 04/28/2017      Reactions   Azithromycin    Cephalexin    Causes her to shake   Ciprofloxacin Nausea And Vomiting, Other (See Comments)   jittery   Codeine    REACTION: shakiness   Contrast Media [iodinated Diagnostic Agents]    Latex    Nitrofuran Derivatives    Nsaids    Nutritional Supplements    Penicillins    REACTION: shakiness   Phenazopyridine    Quinolones    Sulfa Antibiotics    Toviaz [fesoterodine Fumarate Er]    Trimethoprim       Medication List       Accurate as of 04/28/17 12:01 PM. Always use your most recent med list.          atorvastatin 20 MG  tablet Commonly known as:  LIPITOR TAKE ONE TABLET BY MOUTH DAILY   benzonatate 200 MG capsule Commonly known as:  TESSALON Take 1 capsule (200 mg total) by mouth 2 (two) times daily as needed for cough.   busPIRone 15 MG tablet Commonly known as:  BUSPAR TAKE ONE TABLET BY MOUTH TWICE DAILY   CITRACAL/VITAMIN D 250-200 MG-UNIT Tabs Generic drug:  Calcium Citrate-Vitamin D TAKE 2 TABLETS BY MOUTH EVERY DAY   clopidogrel 75 MG tablet Commonly known as:  PLAVIX TAKE ONE TABLET BY MOUTH EVERY DAY   donepezil 5 MG tablet Commonly known as:  ARICEPT Take 1 tablet (5 mg total) by mouth at bedtime.   fosfomycin 3 g Pack Commonly known as:  MONUROL Take 3 g by mouth once.   isosorbide mononitrate 30 MG 24 hr tablet Commonly known as:  IMDUR Take 1 tablet (30 mg total) by mouth 2 (two) times  daily.   lisinopril 5 MG tablet Commonly known as:  PRINIVIL,ZESTRIL TAKE ONE TABLET BY MOUTH EVERY DAY   nitroGLYCERIN 0.4 MG SL tablet Commonly known as:  NITROSTAT Place 1 tablet (0.4 mg total) under the tongue every 5 (five) minutes as needed.   nystatin cream Commonly known as:  MYCOSTATIN Apply 1 application topically 2 (two) times daily.   oxybutynin 10 MG 24 hr tablet Commonly known as:  DITROPAN-XL TAKE ONE TABLET BY MOUTH AT BEDTIME            Discharge Care Instructions        Start     Ordered   04/28/17 0000  Urine Culture     04/28/17 1132   04/28/17 0000  Urinalysis     04/28/17 1148   04/28/17 0000  nystatin cream (MYCOSTATIN)  2 times daily    Question:  Supervising Provider  Answer:  Elenora Gamma   04/28/17 1156   04/28/17 0000  fosfomycin (MONUROL) 3 g PACK   Once    Question:  Supervising Provider  Answer:  Elenora Gamma   04/28/17 1156         Objective:    BP 123/67   Pulse 66   Temp (!) 97.5 F (36.4 C)   Ht 5\' 2"  (1.575 m)   Wt 133 lb (60.3 kg)   BMI 24.33 kg/m   Allergies  Allergen Reactions  . Azithromycin   . Cephalexin     Causes her to shake  . Ciprofloxacin Nausea And Vomiting and Other (See Comments)    jittery  . Codeine     REACTION: shakiness  . Contrast Media [Iodinated Diagnostic Agents]   . Latex   . Nitrofuran Derivatives   . Nsaids   . Nutritional Supplements   . Penicillins     REACTION: shakiness  . Phenazopyridine   . Quinolones   . Sulfa Antibiotics   . Toviaz [Fesoterodine Fumarate Er]   . Trimethoprim     Physical Exam  Constitutional: She is oriented to person, place, and time. She appears well-developed and well-nourished.  HENT:  Head: Normocephalic and atraumatic.  Eyes: Pupils are equal, round, and reactive to light. Conjunctivae are normal.  Cardiovascular: Normal rate, regular rhythm, normal heart sounds and intact distal pulses.   Pulmonary/Chest: Effort normal and breath  sounds normal.  Abdominal: Soft. Bowel sounds are normal. She exhibits no distension and no mass. There is tenderness in the suprapubic area. There is no rebound, no guarding and no CVA tenderness.  Neurological: She is alert  and oriented to person, place, and time. She has normal reflexes.  Skin: Skin is warm and dry. Rash noted. Rash is maculopapular. There is erythema.     In the right inguinal fold and labial area bilateral, there is a red maculopapular confluent rash. There is no obvious satellite lesions. It is quite red and irritated in the fold of the right inguinal area. Adult diaper in place.  Psychiatric: She has a normal mood and affect. Her behavior is normal. Judgment and thought content normal.    Results for orders placed or performed in visit on 03/18/17  Urine Culture  Result Value Ref Range   Urine Culture, Routine Final report    Organism ID, Bacteria Comment   Microscopic Examination  Result Value Ref Range   WBC, UA >30 (A) 0 - 5 /hpf   RBC, UA 3-10 (A) 0 - 2 /hpf   Epithelial Cells (non renal) 0-10 0 - 10 /hpf   Renal Epithel, UA 0-10 (A) None seen /hpf   Bacteria, UA Many (A) None seen/Few  Urinalysis, Complete  Result Value Ref Range   Specific Gravity, UA 1.015 1.005 - 1.030   pH, UA 7.0 5.0 - 7.5   Color, UA Yellow Yellow   Appearance Ur Clear Clear   Leukocytes, UA 1+ (A) Negative   Protein, UA Negative Negative/Trace   Glucose, UA Negative Negative   Ketones, UA Negative Negative   RBC, UA Trace (A) Negative   Bilirubin, UA Negative Negative   Urobilinogen, Ur 0.2 0.2 - 1.0 mg/dL   Nitrite, UA Negative Negative   Microscopic Examination See below:       Assessment & Plan:   1. Dysuria - Urine Culture - Urinalysis  2. Candidal intertrigo - nystatin cream (MYCOSTATIN); Apply 1 application topically 2 (two) times daily.  Dispense: 60 g; Refill: 2  3. Acute cystitis with hematuria - fosfomycin (MONUROL) 3 g PACK; Take 3 g by mouth once.   Dispense: 3 g; Refill: 0    Current Outpatient Prescriptions:  .  atorvastatin (LIPITOR) 20 MG tablet, TAKE ONE TABLET BY MOUTH DAILY, Disp: 90 tablet, Rfl: 2 .  benzonatate (TESSALON) 200 MG capsule, Take 1 capsule (200 mg total) by mouth 2 (two) times daily as needed for cough., Disp: 20 capsule, Rfl: 0 .  busPIRone (BUSPAR) 15 MG tablet, TAKE ONE TABLET BY MOUTH TWICE DAILY, Disp: 180 tablet, Rfl: 2 .  CITRACAL/VITAMIN D 250-200 MG-UNIT TABS, TAKE 2 TABLETS BY MOUTH EVERY DAY, Disp: 180 each, Rfl: 0 .  clopidogrel (PLAVIX) 75 MG tablet, TAKE ONE TABLET BY MOUTH EVERY DAY, Disp: 30 tablet, Rfl: 3 .  donepezil (ARICEPT) 5 MG tablet, Take 1 tablet (5 mg total) by mouth at bedtime., Disp: 90 tablet, Rfl: 1 .  isosorbide mononitrate (IMDUR) 30 MG 24 hr tablet, Take 1 tablet (30 mg total) by mouth 2 (two) times daily., Disp: 180 tablet, Rfl: 3 .  lisinopril (PRINIVIL,ZESTRIL) 5 MG tablet, TAKE ONE TABLET BY MOUTH EVERY DAY, Disp: 30 tablet, Rfl: 4 .  nitroGLYCERIN (NITROSTAT) 0.4 MG SL tablet, Place 1 tablet (0.4 mg total) under the tongue every 5 (five) minutes as needed., Disp: 25 tablet, Rfl: 3 .  oxybutynin (DITROPAN-XL) 10 MG 24 hr tablet, TAKE ONE TABLET BY MOUTH AT BEDTIME, Disp: 90 tablet, Rfl: 2 .  fosfomycin (MONUROL) 3 g PACK, Take 3 g by mouth once., Disp: 3 g, Rfl: 0 .  nystatin cream (MYCOSTATIN), Apply 1 application topically 2 (two) times daily.,  Disp: 60 g, Rfl: 2 Continue all other maintenance medications as listed above.  Follow up plan: Return if symptoms worsen or fail to improve.  Educational handout given for intertrigo  Remus Loffler PA-C [facility] [address]  [place], [place] [address] [phone]   04/28/2017, 12:01 PM

## 2017-04-29 LAB — URINE CULTURE

## 2017-04-30 ENCOUNTER — Other Ambulatory Visit: Payer: Medicare HMO

## 2017-05-19 ENCOUNTER — Other Ambulatory Visit: Payer: Self-pay | Admitting: Family

## 2017-05-25 ENCOUNTER — Other Ambulatory Visit: Payer: Self-pay | Admitting: Pediatrics

## 2017-05-25 ENCOUNTER — Encounter: Payer: Self-pay | Admitting: Family Medicine

## 2017-05-25 ENCOUNTER — Telehealth: Payer: Self-pay | Admitting: Pediatrics

## 2017-05-25 ENCOUNTER — Ambulatory Visit (INDEPENDENT_AMBULATORY_CARE_PROVIDER_SITE_OTHER): Payer: Medicare HMO | Admitting: Family Medicine

## 2017-05-25 VITALS — BP 133/61 | HR 68 | Temp 97.8°F | Ht 62.0 in | Wt 134.0 lb

## 2017-05-25 DIAGNOSIS — M810 Age-related osteoporosis without current pathological fracture: Secondary | ICD-10-CM

## 2017-05-25 DIAGNOSIS — N390 Urinary tract infection, site not specified: Secondary | ICD-10-CM

## 2017-05-25 DIAGNOSIS — N3001 Acute cystitis with hematuria: Secondary | ICD-10-CM

## 2017-05-25 DIAGNOSIS — N3946 Mixed incontinence: Secondary | ICD-10-CM | POA: Diagnosis not present

## 2017-05-25 DIAGNOSIS — R32 Unspecified urinary incontinence: Secondary | ICD-10-CM | POA: Diagnosis not present

## 2017-05-25 LAB — URINALYSIS
BILIRUBIN UA: NEGATIVE
GLUCOSE, UA: NEGATIVE
Nitrite, UA: POSITIVE — AB
Specific Gravity, UA: 1.025 (ref 1.005–1.030)
Urobilinogen, Ur: 0.2 mg/dL (ref 0.2–1.0)
pH, UA: 6 (ref 5.0–7.5)

## 2017-05-25 MED ORDER — CEPHALEXIN 500 MG PO CAPS: 500.0000 mg | ORAL_CAPSULE | Freq: Four times a day (QID) | ORAL | 0 refills | Status: DC

## 2017-05-25 NOTE — Patient Instructions (Signed)
We discussed your "allergy" to penicillins and cephalosporins. You told me that you have actually tolerated this without difficulty in the past. For this reason, I have prescribed you cephalexin to take 4 times a day. You will take this for the next week. I have placed a new referral to urology. I highly recommend that you see the urologist. If you develop fevers, chills, nausea, vomiting, worsening symptoms, bright red blood in your urine please seek immediate medical attention.   Urinary Tract Infection, Adult A urinary tract infection (UTI) is an infection of any part of the urinary tract, which includes the kidneys, ureters, bladder, and urethra. These organs make, store, and get rid of urine in the body. UTI can be a bladder infection (cystitis) or kidney infection (pyelonephritis). What are the causes? This infection may be caused by fungi, viruses, or bacteria. Bacteria are the most common cause of UTIs. This condition can also be caused by repeated incomplete emptying of the bladder during urination. What increases the risk? This condition is more likely to develop if:  You ignore your need to urinate or hold urine for long periods of time.  You do not empty your bladder completely during urination.  You wipe back to front after urinating or having a bowel movement, if you are female.  You are uncircumcised, if you are female.  You are constipated.  You have a urinary catheter that stays in place (indwelling).  You have a weak defense (immune) system.  You have a medical condition that affects your bowels, kidneys, or bladder.  You have diabetes.  You take antibiotic medicines frequently or for long periods of time, and the antibiotics no longer work well against certain types of infections (antibiotic resistance).  You take medicines that irritate your urinary tract.  You are exposed to chemicals that irritate your urinary tract.  You are female.  What are the signs or  symptoms? Symptoms of this condition include:  Fever.  Frequent urination or passing small amounts of urine frequently.  Needing to urinate urgently.  Pain or burning with urination.  Urine that smells bad or unusual.  Cloudy urine.  Pain in the lower abdomen or back.  Trouble urinating.  Blood in the urine.  Vomiting or being less hungry than normal.  Diarrhea or abdominal pain.  Vaginal discharge, if you are female.  How is this diagnosed? This condition is diagnosed with a medical history and physical exam. You will also need to provide a urine sample to test your urine. Other tests may be done, including:  Blood tests.  Sexually transmitted disease (STD) testing.  If you have had more than one UTI, a cystoscopy or imaging studies may be done to determine the cause of the infections. How is this treated? Treatment for this condition often includes a combination of two or more of the following:  Antibiotic medicine.  Other medicines to treat less common causes of UTI.  Over-the-counter medicines to treat pain.  Drinking enough water to stay hydrated.  Follow these instructions at home:  Take over-the-counter and prescription medicines only as told by your health care provider.  If you were prescribed an antibiotic, take it as told by your health care provider. Do not stop taking the antibiotic even if you start to feel better.  Avoid alcohol, caffeine, tea, and carbonated beverages. They can irritate your bladder.  Drink enough fluid to keep your urine clear or pale yellow.  Keep all follow-up visits as told by your health  care provider. This is important.  Make sure to: ? Empty your bladder often and completely. Do not hold urine for long periods of time. ? Empty your bladder before and after sex. ? Wipe from front to back after a bowel movement if you are female. Use each tissue one time when you wipe. Contact a health care provider if:  You have  back pain.  You have a fever.  You feel nauseous or vomit.  Your symptoms do not get better after 3 days.  Your symptoms go away and then return. Get help right away if:  You have severe back pain or lower abdominal pain.  You are vomiting and cannot keep down any medicines or water. This information is not intended to replace advice given to you by your health care provider. Make sure you discuss any questions you have with your health care provider. Document Released: 05/28/2005 Document Revised: 01/30/2016 Document Reviewed: 07/09/2015 Elsevier Interactive Patient Education  2017 [employer].

## 2017-05-25 NOTE — Progress Notes (Signed)
Subjective: JW:[id number] symptoms PCP: Suzanne Sheriff, MD [id number] [name] is a 83 y.o. female, who is accompanied by her daughter to today's visit. She is presenting to clinic today for:  1. Urinary symptoms Patient was seen on 04/28/2017 for urinary symptoms. UA was suggestive of urinary tract infection. She was treated with fosfomycin. Urine culture grew mixed flora with no predominant pathogen. It appears that she has had several UTIs over the last year. In June 2018, her urine grew a multidrug resistant Escherichia coli. Patient has several drug allergies. She is referred to urology in 2014 by Dr. Modesto Charon. She notes that about 1 week ago, she developed urinary frequency, dysuria, urinary urgency. She denies fevers, chills, nausea, vomiting, abdominal pain or new back pain, hematuria. She notes that she has been hydrating well. She reports that she has not seen urology in some time. Additionally, she is unsure of what her true allergy to cephalosporins, penicillins and Pyridium are.  She notes that she tolerated Vantin prescribed by her Urologist a couple of years ago without difficulty.  No rash, SOB, wheeze, difficulty swallowing.  She has a tremor at baseline.  Additionally, she reports urinary incontinence. Neither she nor her daughter know if she is currently taking Ditropan. She reports she wears pads daily. She reports incontinence with getting up from a seated position.  Allergies  Allergen Reactions  . Azithromycin   . Cephalexin     Causes her to shake  . Ciprofloxacin Nausea And Vomiting and Other (See Comments)    jittery  . Codeine     REACTION: shakiness  . Contrast Media [Iodinated Diagnostic Agents]   . Latex   . Nitrofuran Derivatives   . Nsaids   . Nutritional Supplements   . Penicillins     REACTION: shakiness  . Phenazopyridine   . Quinolones   . Sulfa Antibiotics   . Toviaz [Fesoterodine Fumarate Er]   . Trimethoprim    Past Medical History:  Diagnosis  Date  . Anxiety   . COPD (chronic obstructive pulmonary disease) (HCC)   . Coronary atherosclerosis of native coronary artery    DES LAD and BMS LAD 8/08  . Esophageal stricture   . Essential hypertension, benign   . GERD (gastroesophageal reflux disease)   . MI (myocardial infarction) (HCC) 2008  . Mixed hyperlipidemia   . Osteoporosis   . Pelvic fracture (HCC) 08/2014  . Resting tremor   . Rickets    Family History  Problem Relation Age of Onset  . Hypertension Unknown    Social Hx: non smoker.Current medications reviewed.   ROS: Per HPI  Objective: Office vital signs reviewed. BP 133/61   Pulse 68   Temp 97.8 F (36.6 C) (Oral)   Ht  (1.575 m)   Wt 134 lb (60.8 kg)   BMI 24.51 kg/m   Physical Examination:  General: Awake, alert, elderly female, well nourished, No acute distress Cardio: regular rate Pulm: normal work of breathing on room air GI: soft, non-tender, non-distended, bowel sounds present x4, GU: no suprapubic TTP, mild Right sided CVA TTP. Neuro: follows commands, speech normal, does repeat herself intermittently.  No results found for this or any previous visit (from the past 24 hour(s)).   Assessment/ Plan: 84 y.o. female   1. Acute cystitis with hematuria Urinalysis with trace ketones, 2+ blood, 2+ protein, nitrite positive, 3+ leukocytes. Unfortunately microscopic exam was not performed due to insufficient specimen. Instead, remaining urine specimen was sent for urine culture. I  did have a discussion with both patient and her daughter regarding recurrent/frequent urinary tract infections. I discussed that her urinary incontinence/incomplete voiding puts her at increased risk of urinary tract infections. See below. Additionally, she was treated in August for UTI symptoms. Urine culture at that time showed mixed flora but no predominant pathogen. I am somewhat concerned that she will ultimately developed a resistance to oral medications. Per today's  discussion, it does not sound like she has a true allergy to penicillins and cephalosporins. It appears she is growing Escherichia coli in the past, which has fairly good response in this area to cephalosporins. Therefore, I have prescribed Keflex 500 mg 4 times daily for the next 7 days to cover for possible early pyelonephritis. She is afebrile today with normal vital signs. However, she did have mild right-sided CVA tenderness. We reviewed strict return precautions and reasons for emergent evaluation. Both patient and her daughter voiced good understanding. I also placed a new referral to urology. I stressed the importance of keeping this appointment. - Urine Culture - Urinalysis - cephALEXin (KEFLEX) 500 MG capsule; Take 1 capsule (500 mg total) by mouth 4 (four) times daily.  Dispense: 28 capsule; Refill: 0 - Ambulatory referral to Urology  2. Mixed stress and urge urinary incontinence We reviewed frequent emptying of bladder. I encouraged consumption of water and avoidance of sugary beverages and foods. He'll exercises also recommended. Patient to follow-up with urology for further pharmacologic recommendations and urinary tract evaluation. - Ambulatory referral to Urology  3. Recurrent UTI - Ambulatory referral to Urology   Orders Placed This Encounter  Procedures  . Urine Culture  . Urinalysis  . Ambulatory referral to Urology    Referral Priority:   Routine    Referral Type:   Consultation    Referral Reason:   Specialty Services Required    Requested Specialty:   Urology    Number of Visits Requested:   1   Meds ordered this encounter  Medications  . cephALEXin (KEFLEX) 500 MG capsule    Sig: Take 1 capsule (500 mg total) by mouth 4 (four) times daily.    Dispense:  28 capsule    Refill:  0   Follow up prn Suzanne/ PCP  Suzanne Rhodes Hulen Skains, DO [facility] [phone]

## 2017-05-27 ENCOUNTER — Telehealth: Payer: Self-pay | Admitting: Pediatrics

## 2017-05-27 ENCOUNTER — Telehealth: Payer: Self-pay | Admitting: *Deleted

## 2017-05-27 NOTE — Telephone Encounter (Signed)
Per pt's son, Keflex is causing confusion for pt when she takes it QID Pt can take it BID with no problem Please advise

## 2017-05-27 NOTE — Telephone Encounter (Signed)
Pt's son notified of recommendation Verbalizes understanding

## 2017-05-27 NOTE — Telephone Encounter (Signed)
If she is able to take it 3 times that would be better.  Otherwise, twice daily is ok but if her urinary symptoms do not resolve or she develops other symptoms of infection, please have her seek immediate medical attention.

## 2017-05-29 ENCOUNTER — Encounter (HOSPITAL_COMMUNITY): Payer: Commercial Managed Care - HMO

## 2017-05-29 LAB — URINE CULTURE

## 2017-06-01 ENCOUNTER — Telehealth: Payer: Self-pay | Admitting: Pediatrics

## 2017-06-02 ENCOUNTER — Encounter (HOSPITAL_COMMUNITY): Payer: Commercial Managed Care - HMO

## 2017-06-03 NOTE — Telephone Encounter (Signed)
All numbers are unavailable. Multiple attempts made to both home and mobile numbers.

## 2017-06-09 ENCOUNTER — Encounter (HOSPITAL_COMMUNITY)
Admission: RE | Admit: 2017-06-09 | Discharge: 2017-06-09 | Disposition: A | Discharge status: Home / Self Care | Payer: Medicare HMO | Source: Ambulatory Visit | Attending: Family | Admitting: Family

## 2017-06-09 DIAGNOSIS — M810 Age-related osteoporosis without current pathological fracture: Secondary | ICD-10-CM | POA: Insufficient documentation

## 2017-06-09 LAB — COMPREHENSIVE METABOLIC PANEL
ALBUMIN: 3.7 g/dL (ref 3.5–5.0)
ALK PHOS: 59 U/L (ref 38–126)
ALT: 10 U/L — AB (ref 14–54)
ANION GAP: 8 (ref 5–15)
AST: 19 U/L (ref 15–41)
BILIRUBIN TOTAL: 1.3 mg/dL — AB (ref 0.3–1.2)
BUN: 13 mg/dL (ref 6–20)
CALCIUM: 8.9 mg/dL (ref 8.9–10.3)
CO2: 26 mmol/L (ref 22–32)
CREATININE: 1.02 mg/dL — AB (ref 0.44–1.00)
Chloride: 103 mmol/L (ref 101–111)
GFR calc Af Amer: 57 mL/min — ABNORMAL LOW (ref >60)
GFR calc non Af Amer: 49 mL/min — ABNORMAL LOW (ref >60)
GLUCOSE: 115 mg/dL — AB (ref 65–99)
Potassium: 3.9 mmol/L (ref 3.5–5.1)
SODIUM: 137 mmol/L (ref 135–145)
Total Protein: 6.7 g/dL (ref 6.5–8.1)

## 2017-06-09 MED ORDER — ZOLEDRONIC ACID 5 MG/100ML IV SOLN
5.0000 mg | Freq: Once | INTRAVENOUS | Status: AC
  Administered 2017-06-09: 5 mg via INTRAVENOUS
  Filled 2017-06-09: qty 100

## 2017-06-09 MED ORDER — SODIUM CHLORIDE 0.9 % IV SOLN
INTRAVENOUS | Status: DC
  Administered 2017-06-09: 11:00:00 via INTRAVENOUS

## 2017-06-09 NOTE — Discharge Instructions (Signed)

## 2017-06-11 ENCOUNTER — Other Ambulatory Visit: Payer: Medicare HMO

## 2017-06-18 ENCOUNTER — Telehealth: Payer: Self-pay | Admitting: Pediatrics

## 2017-06-18 NOTE — Telephone Encounter (Signed)
Called and left message, no answer. I have met [name] and [name], is [name] another brother? I know a daughter sometimes is with her as well. [name] needs 24 hour supervision, is he part of the 24h supervision? What days and hours in the week is he with her usually?

## 2017-06-19 ENCOUNTER — Ambulatory Visit (INDEPENDENT_AMBULATORY_CARE_PROVIDER_SITE_OTHER): Payer: Medicare HMO | Admitting: Urology

## 2017-06-19 DIAGNOSIS — N3020 Other chronic cystitis without hematuria: Secondary | ICD-10-CM | POA: Diagnosis not present

## 2017-06-19 DIAGNOSIS — N3281 Overactive bladder: Secondary | ICD-10-CM | POA: Diagnosis not present

## 2017-06-19 NOTE — Telephone Encounter (Signed)
Spoke with Bonita QuinKenneth Michael Canon. He along with his two siblings provide 24h care for his mother who has dementia and is not able to be left alone. Letter written.

## 2017-06-19 NOTE — Telephone Encounter (Signed)
[name] and [name] are the same person, his name is "[name]" but he uses [name].  He is with his mother daily from 9:00 pm to 9:00 am or a little later.  Please advise.

## 2017-06-19 NOTE — Telephone Encounter (Signed)
Tried again, no answer

## 2017-06-25 ENCOUNTER — Ambulatory Visit (INDEPENDENT_AMBULATORY_CARE_PROVIDER_SITE_OTHER): Payer: Medicare HMO

## 2017-06-25 ENCOUNTER — Other Ambulatory Visit: Payer: Medicare HMO

## 2017-06-25 DIAGNOSIS — Z23 Encounter for immunization: Secondary | ICD-10-CM | POA: Diagnosis not present

## 2017-07-07 ENCOUNTER — Telehealth: Payer: Self-pay | Admitting: Pediatrics

## 2017-07-07 ENCOUNTER — Other Ambulatory Visit: Payer: Medicare HMO

## 2017-07-07 DIAGNOSIS — N390 Urinary tract infection, site not specified: Secondary | ICD-10-CM | POA: Diagnosis not present

## 2017-07-07 LAB — URINALYSIS, ROUTINE W REFLEX MICROSCOPIC
Bilirubin, UA: NEGATIVE
GLUCOSE, UA: NEGATIVE
KETONES UA: NEGATIVE
NITRITE UA: NEGATIVE
Protein, UA: NEGATIVE
Specific Gravity, UA: 1.02 (ref 1.005–1.030)
UUROB: 0.2 mg/dL (ref 0.2–1.0)
pH, UA: 7.5 (ref 5.0–7.5)

## 2017-07-07 LAB — MICROSCOPIC EXAMINATION

## 2017-07-07 NOTE — Telephone Encounter (Signed)
Pt notified urine culture will not be back for 3-4 days Will call back with result

## 2017-07-08 LAB — URINE CULTURE

## 2017-07-14 DIAGNOSIS — R32 Unspecified urinary incontinence: Secondary | ICD-10-CM | POA: Diagnosis not present

## 2017-07-15 ENCOUNTER — Other Ambulatory Visit: Payer: Self-pay | Admitting: Pediatrics

## 2017-07-15 DIAGNOSIS — F0390 Unspecified dementia without behavioral disturbance: Secondary | ICD-10-CM

## 2017-07-21 ENCOUNTER — Encounter: Payer: Self-pay | Admitting: *Deleted

## 2017-07-21 ENCOUNTER — Ambulatory Visit: Payer: Commercial Managed Care - HMO | Admitting: Cardiology

## 2017-08-11 ENCOUNTER — Encounter: Payer: Self-pay | Admitting: *Deleted

## 2017-08-12 ENCOUNTER — Encounter: Payer: Self-pay | Admitting: Cardiology

## 2017-08-12 ENCOUNTER — Ambulatory Visit: Payer: Commercial Managed Care - HMO | Admitting: Cardiology

## 2017-08-12 NOTE — Progress Notes (Deleted)
Cardiology Office Note  Date: 08/12/2017   ID: [name], DOB [birth year], MRN [id number]  PCP: Johna SheriffVincent, Carol L, MD  Primary Cardiologist: Nona DellSamuel McDowell, MD   No chief complaint on file.   History of Present Illness: [name] is an 83 y.o. female last seen in May.  Past Medical History:  Diagnosis Date  . Anxiety   . COPD (chronic obstructive pulmonary disease) (HCC)   . Coronary atherosclerosis of native coronary artery    DES LAD and BMS LAD 8/08  . Esophageal stricture   . Essential hypertension, benign   . GERD (gastroesophageal reflux disease)   . MI (myocardial infarction) (HCC) 2008  . Mixed hyperlipidemia   . Osteoporosis   . Pelvic fracture (HCC) 08/2014  . Resting tremor   . Rickets     Past Surgical History:  Procedure Laterality Date  . APPENDECTOMY    . Bilateral cataract surgery    . CARDIAC CATHETERIZATION    . CHOLECYSTECTOMY    . CORONARY ANGIOPLASTY WITH STENT PLACEMENT    . Left Sacroiliac Joint Injection  11/27/11   DR. O'TOOLE  . Left Trochanteric Bursa Injection  11/27/11   DR. O'TOOLE  . VEIN LIGATION AND STRIPPING    . YAG LASER APPLICATION  09/06/2012   Procedure: YAG LASER APPLICATION;  Surgeon: Susa Simmondsarroll F Haines, MD;  Location: [facility];  Service: Ophthalmology;  Laterality: Left;    Current Outpatient Medications  Medication Sig Dispense Refill  . atorvastatin (LIPITOR) 20 MG tablet TAKE ONE TABLET BY MOUTH DAILY 90 tablet 2  . benzonatate (TESSALON) 200 MG capsule Take 1 capsule (200 mg total) by mouth 2 (two) times daily as needed for cough. 20 capsule 0  . busPIRone (BUSPAR) 15 MG tablet TAKE ONE TABLET BY MOUTH TWICE DAILY 180 tablet 2  . cephALEXin (KEFLEX) 500 MG capsule Take 1 capsule (500 mg total) by mouth 4 (four) times daily. 28 capsule 0  . CITRACAL/VITAMIN D 250-200 MG-UNIT TABS TAKE ONE TABLET BY MOUTH TWICE DAILY 180 each 1  . clopidogrel (PLAVIX) 75 MG tablet TAKE ONE TABLET BY MOUTH EVERY DAY 30 tablet 3    . donepezil (ARICEPT) 5 MG tablet TAKE ONE TABLET BY MOUTH AT BEDTIME 90 tablet 1  . isosorbide mononitrate (IMDUR) 30 MG 24 hr tablet Take 1 tablet (30 mg total) by mouth 2 (two) times daily. 180 tablet 3  . lisinopril (PRINIVIL,ZESTRIL) 5 MG tablet TAKE ONE TABLET BY MOUTH EVERY DAY 30 tablet 4  . nitroGLYCERIN (NITROSTAT) 0.4 MG SL tablet Place 1 tablet (0.4 mg total) under the tongue every 5 (five) minutes as needed. 25 tablet 3  . nystatin cream (MYCOSTATIN) Apply 1 application topically 2 (two) times daily. 60 g 2  . oxybutynin (DITROPAN-XL) 10 MG 24 hr tablet TAKE ONE TABLET BY MOUTH AT BEDTIME 90 tablet 2   No current facility-administered medications for this visit.    Allergies:  Azithromycin; Cephalexin; Ciprofloxacin; Codeine; Contrast media [iodinated diagnostic agents]; Latex; Nitrofuran derivatives; Nsaids; Nutritional supplements; Penicillins; Phenazopyridine; Quinolones; Sulfa antibiotics; Toviaz [fesoterodine fumarate er]; and Trimethoprim   Social History: The patient  reports that  has never smoked. she has never used smokeless tobacco. She reports that she does not drink alcohol or use drugs.   Family History: The patient's family history includes Hypertension in her unknown relative.   ROS:  Please see the history of present illness. Otherwise, complete review of systems is positive for {NONE DEFAULTED:18576::"none"}.  All other systems are  reviewed and negative.   Physical Exam: VS:  There were no vitals taken for this visit., BMI There is no height or weight on file to calculate BMI.  Wt Readings from Last 3 Encounters:  05/25/17 134 lb (60.8 kg)  04/28/17 133 lb (60.3 kg)  02/05/17 134 lb 9.6 oz (61.1 kg)    General: Patient appears comfortable at rest. HEENT: Conjunctiva and lids normal, oropharynx clear with moist mucosa. Neck: Supple, no elevated JVP or carotid bruits, no thyromegaly. Lungs: Clear to auscultation, nonlabored breathing at rest. Cardiac:  Regular rate and rhythm, no S3 or significant systolic murmur, no pericardial rub. Abdomen: Soft, nontender, no hepatomegaly, bowel sounds present, no guarding or rebound. Extremities: No pitting edema, distal pulses 2+. Skin: Warm and dry. Musculoskeletal: No kyphosis. Neuropsychiatric: Alert and oriented x3, affect grossly appropriate.  ECG: I personally reviewed the tracing from 01/22/2017 which showed sinus rhythm.  Recent Labwork: 06/09/2017: ALT 10; AST 19; BUN 13; Creatinine, Ser 1.02; Potassium 3.9; Sodium 137     Component Value Date/Time   CHOL 146 11/09/2014 1523   TRIG 88 11/09/2014 1523   HDL 72 11/09/2014 1523   CHOLHDL 2.0 11/09/2014 1523   CHOLHDL 2.1 06/07/2012 0528   VLDL 17 06/07/2012 0528   LDLCALC 56 11/09/2014 1523    Other Studies Reviewed Today:   Assessment and Plan:    Current medicines were reviewed with the patient today.  No orders of the defined types were placed in this encounter.   Disposition:  Signed, Jonelle SidleSamuel G. McDowell, MD, [facility] 08/12/2017 10:44 AM    [facility] [address], [place], [place] [address] Phone: [phone]; Fax: [phone]

## 2017-08-16 ENCOUNTER — Other Ambulatory Visit: Payer: Self-pay | Admitting: Pediatrics

## 2017-08-16 DIAGNOSIS — I10 Essential (primary) hypertension: Secondary | ICD-10-CM

## 2017-08-17 ENCOUNTER — Encounter: Payer: Self-pay | Admitting: Family Medicine

## 2017-08-17 ENCOUNTER — Ambulatory Visit (INDEPENDENT_AMBULATORY_CARE_PROVIDER_SITE_OTHER): Payer: Medicare HMO

## 2017-08-17 ENCOUNTER — Ambulatory Visit (INDEPENDENT_AMBULATORY_CARE_PROVIDER_SITE_OTHER): Payer: Medicare HMO | Admitting: Family Medicine

## 2017-08-17 VITALS — BP 157/71 | HR 55 | Temp 97.2°F | Ht 62.0 in | Wt 138.0 lb

## 2017-08-17 DIAGNOSIS — J40 Bronchitis, not specified as acute or chronic: Secondary | ICD-10-CM

## 2017-08-17 DIAGNOSIS — F0390 Unspecified dementia without behavioral disturbance: Secondary | ICD-10-CM | POA: Diagnosis not present

## 2017-08-17 DIAGNOSIS — N3281 Overactive bladder: Secondary | ICD-10-CM | POA: Diagnosis not present

## 2017-08-17 DIAGNOSIS — R682 Dry mouth, unspecified: Secondary | ICD-10-CM | POA: Diagnosis not present

## 2017-08-17 DIAGNOSIS — K117 Disturbances of salivary secretion: Secondary | ICD-10-CM

## 2017-08-17 DIAGNOSIS — R05 Cough: Secondary | ICD-10-CM | POA: Diagnosis not present

## 2017-08-17 MED ORDER — BENZONATATE 200 MG PO CAPS: 200.0000 mg | ORAL_CAPSULE | Freq: Three times a day (TID) | ORAL | 0 refills | Status: DC | PRN

## 2017-08-17 NOTE — Progress Notes (Deleted)
Subjective: CC: bronchitis PCP: Johna SheriffVincent, Carol L, MD YQM:VHQIONGEXHPI:Suzanne Georgana CurioBibee is a 83 y.o. female presenting to clinic today for:  1. URI symptoms Patient reports *** that started ***.  *** cough, hemoptysis, congestion, rhinorrhea, sinus pressure, headache, SOB, dizziness, rash, nausea, vomiting, diarrhea, fevers, chills, myalgia, sick contacts, recent travel.  Patient has used *** with *** relief of symptoms.  *** history of COPD or asthma.  *** tobacco use/ exposure.    ROS: Per HPI  Allergies  Allergen Reactions  . Azithromycin   . Cephalexin     Causes her to shake  . Ciprofloxacin Nausea And Vomiting and Other (See Comments)    jittery  . Codeine     REACTION: shakiness  . Contrast Media [Iodinated Diagnostic Agents]   . Latex   . Nitrofuran Derivatives   . Nsaids   . Nutritional Supplements   . Penicillins     REACTION: shakiness  . Phenazopyridine   . Quinolones   . Sulfa Antibiotics   . Toviaz [Fesoterodine Fumarate Er]   . Trimethoprim    Past Medical History:  Diagnosis Date  . Anxiety   . COPD (chronic obstructive pulmonary disease) (HCC)   . Coronary atherosclerosis of native coronary artery    DES LAD and BMS LAD 8/08  . Esophageal stricture   . Essential hypertension, benign   . GERD (gastroesophageal reflux disease)   . MI (myocardial infarction) (HCC) 2008  . Mixed hyperlipidemia   . Osteoporosis   . Pelvic fracture (HCC) 08/2014  . Resting tremor   . Rickets     Current Outpatient Medications:  .  atorvastatin (LIPITOR) 20 MG tablet, TAKE ONE TABLET BY MOUTH DAILY, Disp: 90 tablet, Rfl: 2 .  benzonatate (TESSALON) 200 MG capsule, Take 1 capsule (200 mg total) by mouth 2 (two) times daily as needed for cough., Disp: 20 capsule, Rfl: 0 .  busPIRone (BUSPAR) 15 MG tablet, TAKE ONE TABLET BY MOUTH TWICE DAILY, Disp: 180 tablet, Rfl: 2 .  cephALEXin (KEFLEX) 500 MG capsule, Take 1 capsule (500 mg total) by mouth 4 (four) times daily., Disp: 28  capsule, Rfl: 0 .  CITRACAL/VITAMIN D 250-200 MG-UNIT TABS, TAKE ONE TABLET BY MOUTH TWICE DAILY, Disp: 180 each, Rfl: 1 .  clopidogrel (PLAVIX) 75 MG tablet, TAKE ONE TABLET BY MOUTH EVERY DAY, Disp: 30 tablet, Rfl: 3 .  donepezil (ARICEPT) 5 MG tablet, TAKE ONE TABLET BY MOUTH AT BEDTIME, Disp: 90 tablet, Rfl: 1 .  isosorbide mononitrate (IMDUR) 30 MG 24 hr tablet, Take 1 tablet (30 mg total) by mouth 2 (two) times daily., Disp: 180 tablet, Rfl: 3 .  lisinopril (PRINIVIL,ZESTRIL) 5 MG tablet, TAKE ONE TABLET BY MOUTH EVERY DAY, Disp: 30 tablet, Rfl: 2 .  nitroGLYCERIN (NITROSTAT) 0.4 MG SL tablet, Place 1 tablet (0.4 mg total) under the tongue every 5 (five) minutes as needed., Disp: 25 tablet, Rfl: 3 .  nystatin cream (MYCOSTATIN), Apply 1 application topically 2 (two) times daily., Disp: 60 g, Rfl: 2 .  oxybutynin (DITROPAN-XL) 10 MG 24 hr tablet, TAKE ONE TABLET BY MOUTH AT BEDTIME, Disp: 90 tablet, Rfl: 2 Social History   Socioeconomic History  . Marital status: Widowed    Spouse name: Not on file  . Number of children: Not on file  . Years of education: Not on file  . Highest education level: Not on file  Social Needs  . Financial resource strain: Not on file  . Food insecurity - worry: Not on file  .  Food insecurity - inability: Not on file  . Transportation needs - medical: Not on file  . Transportation needs - non-medical: Not on file  Occupational History  . Not on file  Tobacco Use  . Smoking status: Never Smoker  . Smokeless tobacco: Never Used  Substance and Sexual Activity  . Alcohol use: No    Alcohol/week: 0.0 oz  . Drug use: No  . Sexual activity: No  Other Topics Concern  . Not on file  Social History Narrative  . Not on file   Family History  Problem Relation Age of Onset  . Hypertension Unknown     Objective: Office vital signs reviewed. There were no vitals taken for this visit.  Physical Examination:  General: Awake, alert, *** nourished, No  acute distress HEENT: Normal    Neck: No masses palpated. No lymphadenopathy    Ears: Tympanic membranes intact, normal light reflex, no erythema, no bulging    Eyes: PERRLA, extraocular membranes intact, sclera ***    Nose: nasal turbinates moist, *** nasal discharge    Throat: moist mucus membranes, no erythema, *** tonsillar exudate.  Airway is patent Cardio: regular rate and rhythm, S1S2 heard, no murmurs appreciated Pulm: clear to auscultation bilaterally, no wheezes, rhonchi or rales; normal work of breathing on room air GI: soft, non-tender, non-distended, bowel sounds present x4, no hepatomegaly, no splenomegaly, no masses GU: external vaginal tissue ***, cervix ***, *** punctate lesions on cervix appreciated, *** discharge from cervical os, *** bleeding, *** cervical motion tenderness, *** abdominal/ adnexal masses Extremities: warm, well perfused, No edema, cyanosis or clubbing; +*** pulses bilaterally MSK: *** gait and *** station Skin: dry; intact; no rashes or lesions Neuro: *** Strength and light touch sensation grossly intact, *** DTRs ***/4  Assessment/ Plan: 83 y.o. female   ***  No orders of the defined types were placed in this encounter.  No orders of the defined types were placed in this encounter.    Raliegh IpAshly M Jalisa Sacco, DO [facility] [phone]

## 2017-08-17 NOTE — Patient Instructions (Signed)
Use vaseline or Blistex for lips as needed and Biotene lozenges/mouthwash as needed for dry mouth.

## 2017-08-17 NOTE — Progress Notes (Signed)
Subjective:  Patient ID: [name], female    DOB: Sep 29, 1930  Age: 83 y.o. MRN: [id number]  CC: Cough (pt here today c/o cough and dry mouth)   HPI Suzanne MillinKatherine Legrande presents for dry mouth and lips. These have been increasing for an indeterminate time. Pt. Concerned that dryness may be caused by her heart med. Has been having cough for a week. Denies fever, dyspnea.  Cough has been nonproductive.  Patient is taking Ditropan for her urinary incontinence issues.  She wears a pad.  She does not want to discontinue the medication at this time.  She did rather use Vaseline for her lips.    Dep[facility] 2/[facility] 08/17/2017 05/25/2017 04/28/2017  Decreased Interest 0 0 1  Down, Depressed, Hopeless 0 0 1  PHQ - 2 Score 0 0 2  Altered sleeping - - 0  Tired, decreased energy - - 1  Change in appetite - - 0  Feeling bad or failure about yourself  - - 1  Trouble concentrating - - 1  Moving slowly or fidgety/restless - - 0  Suicidal thoughts - - 0  PHQ-9 Score - - 5  Difficult doing work/chores - - -  Some recent data might be hidden    History [name] has a past medical history of Anxiety, COPD (chronic obstructive pulmonary disease) (HCC), Coronary atherosclerosis of native coronary artery, Esophageal stricture, Essential hypertension, benign, GERD (gastroesophageal reflux disease), MI (myocardial infarction) (HCC) (2008), Mixed hyperlipidemia, Osteoporosis, Pelvic fracture (HCC) (08/2014), Resting tremor, and Rickets.   She has a past surgical history that includes Appendectomy; Cholecystectomy; Bilateral cataract surgery; Vein ligation and stripping; Cardiac catheterization; Coronary angioplasty with stent; Left Trochanteric Bursa Injection (11/27/11); Left Sacroiliac Joint Injection (11/27/11); and Yag laser application (09/06/2012).   Her family history includes Hypertension in her unknown relative.She reports that  has never smoked. she has never used smokeless tobacco. She reports  that she does not drink alcohol or use drugs.    ROS Review of Systems  Constitutional: Negative for activity change, appetite change and fever.  HENT: Negative for congestion, mouth sores, rhinorrhea and sore throat.   Eyes: Negative for visual disturbance.  Respiratory: Negative for cough and shortness of breath.   Cardiovascular: Negative for chest pain and palpitations.  Gastrointestinal: Negative for abdominal pain, diarrhea and nausea.  Genitourinary: Positive for enuresis and frequency. Negative for dysuria.  Musculoskeletal: Negative for arthralgias and myalgias.    Objective:  BP (!) 157/71   Pulse (!) 55   Temp (!) 97.2 F (36.2 C) (Oral)   Ht 5\' 2"  (1.575 m)   Wt 138 lb (62.6 kg)   BMI 25.24 kg/m   BP Readings from Last 3 Encounters:  08/17/17 (!) 157/71  05/25/17 133/61  04/28/17 123/67    Wt Readings from Last 3 Encounters:  08/17/17 138 lb (62.6 kg)  05/25/17 134 lb (60.8 kg)  04/28/17 133 lb (60.3 kg)     Physical Exam  Constitutional: She is oriented to person, place, and time. She appears well-developed and well-nourished. No distress.  HENT:  Head: Normocephalic and atraumatic.  Eyes: Conjunctivae are normal. Pupils are equal, round, and reactive to light.  Neck: Normal range of motion. Neck supple. No thyromegaly present.  Cardiovascular: Normal rate, regular rhythm and normal heart sounds.  No murmur heard. Pulmonary/Chest: Effort normal and breath sounds normal. No respiratory distress. She has no wheezes. She has no rales.  Abdominal: Soft. There is no tenderness.  Musculoskeletal: Normal range of  motion.  Lymphadenopathy:    She has no cervical adenopathy.  Neurological: She is alert and oriented to person, place, and time.  Skin: Skin is warm and dry.  Psychiatric: She has a normal mood and affect. Her behavior is normal.  repetative questions and statements      Assessment & Plan:   [name] was seen today for cough.  Diagnoses  and all orders for this visit:  Xerostomia  Bronchitis -     DG Chest 2 View; Future -     benzonatate (TESSALON) 200 MG capsule; Take 1 capsule (200 mg total) by mouth 3 (three) times daily as needed for cough.  Dementia without behavioral disturbance, unspecified dementia type  OAB (overactive bladder)       I have discontinued [name] Chervenak's cephALEXin. I have also changed her benzonatate. Additionally, I am having her maintain her isosorbide mononitrate, atorvastatin, busPIRone, oxybutynin, nitroGLYCERIN, clopidogrel, nystatin cream, CITRACAL/VITAMIN D, donepezil, and lisinopril.  Allergies as of 08/17/2017      Reactions   Azithromycin    Cephalexin    Causes her to shake   Ciprofloxacin Nausea And Vomiting, Other (See Comments)   jittery   Codeine    REACTION: shakiness   Contrast Media [iodinated Diagnostic Agents]    Latex    Nitrofuran Derivatives    Nsaids    Nutritional Supplements    Penicillins    REACTION: shakiness   Phenazopyridine    Quinolones    Sulfa Antibiotics    Toviaz [fesoterodine Fumarate Er]    Trimethoprim       Medication List        Accurate as of 08/17/17 11:12 PM. Always use your most recent med list.          atorvastatin 20 MG tablet Commonly known as:  LIPITOR TAKE ONE TABLET BY MOUTH DAILY   benzonatate 200 MG capsule Commonly known as:  TESSALON Take 1 capsule (200 mg total) by mouth 3 (three) times daily as needed for cough.   busPIRone 15 MG tablet Commonly known as:  BUSPAR TAKE ONE TABLET BY MOUTH TWICE DAILY   CITRACAL/VITAMIN D 250-200 MG-UNIT Tabs Generic drug:  Calcium Citrate-Vitamin D TAKE ONE TABLET BY MOUTH TWICE DAILY   clopidogrel 75 MG tablet Commonly known as:  PLAVIX TAKE ONE TABLET BY MOUTH EVERY DAY   donepezil 5 MG tablet Commonly known as:  ARICEPT TAKE ONE TABLET BY MOUTH AT BEDTIME   isosorbide mononitrate 30 MG 24 hr tablet Commonly known as:  IMDUR Take 1 tablet (30 mg total)  by mouth 2 (two) times daily.   lisinopril 5 MG tablet Commonly known as:  PRINIVIL,ZESTRIL TAKE ONE TABLET BY MOUTH EVERY DAY   nitroGLYCERIN 0.4 MG SL tablet Commonly known as:  NITROSTAT Place 1 tablet (0.4 mg total) under the tongue every 5 (five) minutes as needed.   nystatin cream Commonly known as:  MYCOSTATIN Apply 1 application topically 2 (two) times daily.   oxybutynin 10 MG 24 hr tablet Commonly known as:  DITROPAN-XL TAKE ONE TABLET BY MOUTH AT BEDTIME      Try using ACT &/or biotene products for dry mouth. Cough is likely acute. Tessalon should help. Long range for mouth may need to consider changing ditropan out for vesicare, myrbetriq if not cost prohibiive - defer to Dr. Oswaldo DoneVincent - follow up with her in a few weeks.  Follow-up: No Follow-up on file.  Mechele ClaudeWarren Colleene Swarthout, M.D.

## 2017-08-18 ENCOUNTER — Ambulatory Visit: Payer: Medicare HMO | Admitting: Family Medicine

## 2017-08-21 ENCOUNTER — Encounter: Payer: Self-pay | Admitting: *Deleted

## 2017-09-02 ENCOUNTER — Encounter: Payer: Self-pay | Admitting: *Deleted

## 2017-09-03 ENCOUNTER — Ambulatory Visit (INDEPENDENT_AMBULATORY_CARE_PROVIDER_SITE_OTHER): Payer: Medicare HMO | Admitting: Cardiology

## 2017-09-03 ENCOUNTER — Other Ambulatory Visit: Payer: Self-pay

## 2017-09-03 ENCOUNTER — Encounter: Payer: Self-pay | Admitting: Cardiology

## 2017-09-03 VITALS — BP 145/72 | HR 58 | Ht 62.0 in | Wt 137.0 lb

## 2017-09-03 DIAGNOSIS — I25119 Atherosclerotic heart disease of native coronary artery with unspecified angina pectoris: Secondary | ICD-10-CM

## 2017-09-03 DIAGNOSIS — I10 Essential (primary) hypertension: Secondary | ICD-10-CM | POA: Diagnosis not present

## 2017-09-03 NOTE — Patient Instructions (Signed)

## 2017-09-03 NOTE — Progress Notes (Signed)
Cardiology Office Note  Date: 09/03/2017   ID: [name], DOB 06/29/1931, MRN [id number]  PCP: Johna Sheriff, MD  Primary Cardiologist: Nona Dell, MD   Chief Complaint  Patient presents with  . Coronary Artery Disease    History of Present Illness: [name] is an 84 y.o. female last seen in May 2018. She presents for a routine follow-up visit. Since last encounter she has had no progressive angina symptoms or nitroglycerin use. She states that she has been taking her medications regularly. She is fairly sedentary, her son checks in on her regularly and helps with her medications.  We went over her current cardiac regimen which is outlined below. She continues to follow with PCP for routine lab work. We have been following her conservatively without ischemic testing in light of clinical stability on medical therapy. ECG from last 6 months was normal.  Past Medical History:  Diagnosis Date  . Anxiety   . COPD (chronic obstructive pulmonary disease) (HCC)   . Coronary atherosclerosis of native coronary artery    DES LAD and BMS LAD 8/08  . Esophageal stricture   . Essential hypertension, benign   . GERD (gastroesophageal reflux disease)   . MI (myocardial infarction) (HCC) 2008  . Mixed hyperlipidemia   . Osteoporosis   . Pelvic fracture (HCC) 08/2014  . Resting tremor   . Rickets     Past Surgical History:  Procedure Laterality Date  . APPENDECTOMY    . Bilateral cataract surgery    . CARDIAC CATHETERIZATION    . CHOLECYSTECTOMY    . CORONARY ANGIOPLASTY WITH STENT PLACEMENT    . Left Sacroiliac Joint Injection  11/27/11   DR. O'TOOLE  . Left Trochanteric Bursa Injection  11/27/11   DR. O'TOOLE  . VEIN LIGATION AND STRIPPING    . YAG LASER APPLICATION  09/06/2012   Procedure: YAG LASER APPLICATION;  Surgeon: Susa Simmonds, MD;  Location: [facility];  Service: Ophthalmology;  Laterality: Left;    Current Outpatient Medications  Medication Sig  Dispense Refill  . atorvastatin (LIPITOR) 20 MG tablet TAKE ONE TABLET BY MOUTH DAILY 90 tablet 2  . benzonatate (TESSALON) 200 MG capsule Take 1 capsule (200 mg total) by mouth 3 (three) times daily as needed for cough. 20 capsule 0  . busPIRone (BUSPAR) 15 MG tablet TAKE ONE TABLET BY MOUTH TWICE DAILY 180 tablet 2  . CITRACAL/VITAMIN D 250-200 MG-UNIT TABS TAKE ONE TABLET BY MOUTH TWICE DAILY 180 each 1  . clopidogrel (PLAVIX) 75 MG tablet TAKE ONE TABLET BY MOUTH EVERY DAY 30 tablet 3  . donepezil (ARICEPT) 5 MG tablet TAKE ONE TABLET BY MOUTH AT BEDTIME 90 tablet 1  . isosorbide mononitrate (IMDUR) 30 MG 24 hr tablet Take 1 tablet (30 mg total) by mouth 2 (two) times daily. 180 tablet 3  . lisinopril (PRINIVIL,ZESTRIL) 5 MG tablet TAKE ONE TABLET BY MOUTH EVERY DAY 30 tablet 2  . MYRBETRIQ 50 MG TB24 tablet Take 50 mg by mouth daily.  11  . nitroGLYCERIN (NITROSTAT) 0.4 MG SL tablet Place 1 tablet (0.4 mg total) under the tongue every 5 (five) minutes as needed. 25 tablet 3  . nystatin cream (MYCOSTATIN) Apply 1 application topically 2 (two) times daily. 60 g 2  . oxybutynin (DITROPAN-XL) 10 MG 24 hr tablet TAKE ONE TABLET BY MOUTH AT BEDTIME 90 tablet 2   No current facility-administered medications for this visit.    Allergies:  Azithromycin; Cephalexin; Ciprofloxacin; Codeine;  Contrast media [iodinated diagnostic agents]; Latex; Nitrofuran derivatives; Nsaids; Nutritional supplements; Penicillins; Phenazopyridine; Quinolones; Sulfa antibiotics; Toviaz [fesoterodine fumarate er]; and Trimethoprim   Social History: The patient  reports that  has never smoked. she has never used smokeless tobacco. She reports that she does not drink alcohol or use drugs.   ROS:  Please see the history of present illness. Otherwise, complete review of systems is positive for anxiety, essential tremor.  All other systems are reviewed and negative.   Physical Exam: VS:  BP (!) 145/72   Pulse (!) 58   Ht 5'  2" (1.575 m)   Wt 137 lb (62.1 kg)   BMI 25.06 kg/m , BMI Body mass index is 25.06 kg/m.  Wt Readings from Last 3 Encounters:  09/03/17 137 lb (62.1 kg)  08/17/17 138 lb (62.6 kg)  05/25/17 134 lb (60.8 kg)    General: Elderly woman, appears comfortable at rest. HEENT: Conjunctiva and lids normal, oropharynx clear. Neck: Supple, no elevated JVP or carotid bruits, no thyromegaly. Lungs: Clear to auscultation, nonlabored breathing at rest. Cardiac: Regular rate and rhythm, no S3 or significant systolic murmur. Abdomen: Soft, nontender, bowel sounds present. Extremities: No pitting edema, distal pulses 2+. Skin: Warm and dry. Musculoskeletal: No kyphosis. Neuropsychiatric: Alert and oriented x3, affect grossly appropriate. Essential tremor.  ECG: I personally reviewed the tracing from 01/22/2017 which showed normal sinus rhythm.  Recent Labwork: 06/09/2017: ALT 10; AST 19; BUN 13; Creatinine, Ser 1.02; Potassium 3.9; Sodium 137     Component Value Date/Time   CHOL 146 11/09/2014 1523   TRIG 88 11/09/2014 1523   HDL 72 11/09/2014 1523   CHOLHDL 2.0 11/09/2014 1523   CHOLHDL 2.1 06/07/2012 0528   VLDL 17 06/07/2012 0528   LDLCALC 56 11/09/2014 1523    Other Studies Reviewed Today:  Cardiac catheterization 06/20/2011: HEMODYNAMIC DATA:Aortic pressure is 139/60 with a mean of 91 mmHg. Left ventricular pressure is 137 with EDP of 10 mmHg.  ANGIOGRAPHIC DATA:The left coronary artery arises and distributes normally.The left main coronary artery is normal.  The left anterior descending artery demonstrates that the proximal and mid to distal vessel stents are widely patent.Between the stents in the mid vessel, there is diffuse 30% narrowing.There is a small first diagonal branch that has a focal 70% stenosis.This is less than 1.5 mm in diameter.  Left circumflex coronary artery distributes normally.It gives rise to 3 marginal branches.In the midvessel, there  is diffuse 40% disease in a segmental fashion.  The right coronary arises and distributes normally.There is segmental 30% disease in the midvessel.  The left ventricular angiography performed in the RAO view demonstrates normal left ventricular size and contractility with normal ejection fraction of 60%.  FINAL IMPRESSION: 1. Single-vessel obstructive coronary artery disease with a modest lesion involving a small diagonal branch.The stents in the proximal and mid to distal left anterior descending are still patent.Compared to her prior cardiac catheterization, there is no significant change 2. Normal left ventricular function.  Assessment and Plan:  1. CAD with previous DES and BMS interventions to the LAD, patent as of last cardiac catheterization in 2012. We are following her conservatively on medical therapy in the absence of progressive angina symptoms. No changes were made today.  2. Essential hypertension, keep follow-up with PCP.  Current medicines were reviewed with the patient today.  Disposition: Follow-up in 6 months.  Signed, Jonelle SidleSamuel G. Bridney Guadarrama, MD, [facility] 09/03/2017 11:09 AM    [facility]  [address]  [place], [place], [place] [address] Phone: [phone]; Fax: [phone]

## 2017-09-23 ENCOUNTER — Encounter: Payer: Self-pay | Admitting: Pediatrics

## 2017-09-23 ENCOUNTER — Ambulatory Visit (INDEPENDENT_AMBULATORY_CARE_PROVIDER_SITE_OTHER): Payer: Medicare HMO | Admitting: Pediatrics

## 2017-09-23 VITALS — BP 137/73 | HR 68 | Temp 97.3°F | Ht 62.0 in | Wt 135.6 lb

## 2017-09-23 DIAGNOSIS — N3281 Overactive bladder: Secondary | ICD-10-CM

## 2017-09-23 DIAGNOSIS — J069 Acute upper respiratory infection, unspecified: Secondary | ICD-10-CM

## 2017-09-23 DIAGNOSIS — R682 Dry mouth, unspecified: Secondary | ICD-10-CM

## 2017-09-23 NOTE — Progress Notes (Signed)
  Subjective:   Patient ID: [name], female    DOB: [birth year], 84 y.o.   MRN: [id number] CC: Cough; Nasal Congestion; and Laryngitis  HPI: Suzanne MillinKatherine Inclan is a 84 y.o. female presenting for Cough; Nasal Congestion; and Laryngitis  Here today with her daughter who is one of several caregivers for pt as she cannot be left alone due to dementia  Some coughing, some congestion for past 2 days No fevers, feeling hot and cold since Monday Taking tylenol regularly  No chest pain or shortness of breath  Birthday two days ago, has had good appetite since then, eating her cake at home still per daughter  Regularly bothered by dry mouth Has both myrbetriq and oxybutynin on medicine list Daughter is not sure if which she is taking or if taking both  Relevant past medical, surgical, family and social history reviewed. Allergies and medications reviewed and updated. Social History   Tobacco Use  Smoking Status Never Smoker  Smokeless Tobacco Never Used   ROS: Per HPI   Objective:    BP 137/73   Pulse 68   Temp (!) 97.3 F (36.3 C) (Oral)   Ht 5\' 2"  (1.575 m)   Wt 135 lb 9.6 oz (61.5 kg)   BMI 24.80 kg/m   Wt Readings from Last 3 Encounters:  09/23/17 135 lb 9.6 oz (61.5 kg)  09/03/17 137 lb (62.1 kg)  08/17/17 138 lb (62.6 kg)    Gen: NAD, alert, cooperative with exam, NCAT EYES: EOMI, no conjunctival injection, or no icterus ENT:  TMs pearly gray b/l, OP without erythema, MMM LYMPH: no cervical LAD CV: NRRR, normal S1/S2 Resp: CTABL, no wheezes, normal WOB Abd: +BS, soft, NTND. no guarding or organomegaly Ext: No edema, warm Neuro: Alert, follows directions, symmetric face, sensation intact b/l face, extremities coordination grossly normal  Assessment & Plan:  Natalia LeatherwoodKatherine was seen today for cough, nasal congestion and laryngitis.  Diagnoses and all orders for this visit:  Dry mouth Likely due to OAB medication, daughter not sure if taking one or both of myrbetriq  and oxybutynin. Daughter going to take medicine list home, compare with list of medicine from insurance co. (meds compre-packaged together by day). If on both, Stop the oxybutynin. If mouth doesn't improve stop the myrbetriq as well. If only on oxybutynin, stop for now. F/u with me in 4 weeks to see if symptoms improve. Pt says she would rather go to the bathroom more than have the dry mouth. If any question about which medicine in the box she needs to stop daughter will bring by office for us to look.  OAB (overactive bladder) Holding medicine as above.  Acute URI Discussed symptom care, return precautions Continue tylenol  Follow up plan: Return in about 4 weeks (around 10/21/2017). Rex Krasarol Daqwan Dougal, MD [facility]

## 2017-09-24 ENCOUNTER — Other Ambulatory Visit: Payer: Medicare HMO

## 2017-10-12 ENCOUNTER — Other Ambulatory Visit: Payer: Self-pay | Admitting: Pediatrics

## 2017-10-12 DIAGNOSIS — E785 Hyperlipidemia, unspecified: Secondary | ICD-10-CM

## 2017-10-12 DIAGNOSIS — I2510 Atherosclerotic heart disease of native coronary artery without angina pectoris: Secondary | ICD-10-CM

## 2017-10-19 DIAGNOSIS — R32 Unspecified urinary incontinence: Secondary | ICD-10-CM | POA: Diagnosis not present

## 2017-10-22 ENCOUNTER — Ambulatory Visit (INDEPENDENT_AMBULATORY_CARE_PROVIDER_SITE_OTHER): Payer: Medicare HMO | Admitting: Pediatrics

## 2017-10-22 ENCOUNTER — Encounter: Payer: Self-pay | Admitting: Pediatrics

## 2017-10-22 VITALS — BP 139/67 | HR 63 | Temp 97.2°F | Ht 62.0 in | Wt 135.8 lb

## 2017-10-22 DIAGNOSIS — R350 Frequency of micturition: Secondary | ICD-10-CM | POA: Diagnosis not present

## 2017-10-22 DIAGNOSIS — F0391 Unspecified dementia with behavioral disturbance: Secondary | ICD-10-CM

## 2017-10-22 DIAGNOSIS — R451 Restlessness and agitation: Secondary | ICD-10-CM

## 2017-10-22 NOTE — Progress Notes (Signed)
Subjective:   Patient ID: [name], female    DOB: August 18, 1931, 84 y.o.   MRN: [id number] CC: Follow-up (4 week) Multiple medical problems HPI: [name] is a 84 y.o. female presenting for Follow-up (4 week)  Here today with son [name]. Has episodes when she doesn't think she belongs at home, that it is someone else's home.  Usually happens at night, often when she wakes up around 11 PM after going to bed at 7 PM, her usual bedtime.  Does not happen every night.  Some nights if family is able to reorient her and get her back to sleep within the hour.  Sometimes she is up for 3 hours upset, asking when the area to take her home.  She is not aggressive, has not done anything that would put herself or others in danger. Family is able to redirect her.  Right now family members with her 24 hours a day 7 days a week.  During the day he knows who she is and where she is.  Is not oriented to date or time.  Son says that she does not remember these episodes of confusion in the middle of the night the next day.  Her son is not sure what medicines if any she is taking for overactive bladder.  He says that the family has been giving her water to drink or rinse her mouth out when her mouth is dry and she is no longer complaining of dry mouth.  She denies having to go to the bathroom and excessive amount during the day or night.  Son is not sure what wakes her up at night when she does wake up.  Relevant past medical, surgical, family and social history reviewed. Allergies and medications reviewed and updated. Social History   Tobacco Use  Smoking Status Never Smoker  Smokeless Tobacco Never Used   ROS: Per HPI   Objective:    BP 139/67   Pulse 63   Temp (!) 97.2 F (36.2 C) (Oral)   Ht 5\' 2"  (1.575 m)   Wt 135 lb 12.8 oz (61.6 kg)   BMI 24.84 kg/m   Wt Readings from Last 3 Encounters:  10/22/17 135 lb 12.8 oz (61.6 kg)  09/23/17 135 lb 9.6 oz (61.5 kg)  09/03/17 137 lb (62.1 kg)    Gen: NAD, alert, cooperative with exam, NCAT EYES: EOMI, no conjunctival injection, or no icterus ENT:  TMs pearly gray b/l, OP without erythema LYMPH: no cervical LAD CV: NRRR, normal S1/S2, no murmur, distal pulses 2+ b/l Resp: CTABL, no wheezes, normal WOB Abd: +BS, soft, NTND. no guarding or organomegaly Ext: No edema, warm Neuro: Alert and oriented to self, knows her birthday, knows she is at the doctor's office.  Does not know what year it is.  Says it is January when asked what month it is. MSK: normal muscle bulk  Assessment & Plan:  Delorese was seen today for follow-up multiple medical problems  Diagnoses and all orders for this visit:  Agitation Intermittent.  Usually at night.  Is able to be redirected.  Son asks about the medicine to use to help calm her down.  Discussed the negative side effects with most of those medicines and best to avoid possible because they can cause sleepiness, more confusion.  Patient is not a danger to herself.  Will continue to redirect as needed at home.  They will let me know if her behavior is getting worse.  Frequent urination Both dry mouth  and overactive bladder symptoms have improved.  Son is not sure if she is taking the oxybutynin and/or the Myrbetriq.  He will double check.  Ideally would like to get her off the oxybutynin if she is on it.  Dementia with behavioral disturbance, unspecified dementia type Continue Aricept.  Follow up plan: Return in about 3 months (around 01/19/2018). Rex Krasarol Tecia Cinnamon, MD [facility]

## 2017-10-22 NOTE — Patient Instructions (Addendum)
Myrbetriq and oxybutynin--should be on only one  If on both, Stop the myrbetriq. If you get dry mouth, switch to myrbetriq. If mouth doesn't improve stop the myrbetriq as well. If only on oxybutynin, stop for now.

## 2017-10-23 LAB — URINE CULTURE

## 2017-10-26 NOTE — Addendum Note (Signed)
Addended by: Johna SheriffVINCENT, Ophie Burrowes L on: 10/26/2017 05:25 PM   Modules accepted: Orders

## 2017-10-28 ENCOUNTER — Telehealth: Payer: Self-pay | Admitting: Pediatrics

## 2017-10-28 NOTE — Telephone Encounter (Signed)
Spoke with son and informed that there is nothing that can be prescribed for anxiety as needed because increase fall risk. Son state that patient is no harm to her self or anyone else.  Informed patient that the best thing to do is have someone with patient 24/7 to help redirect patient when gets upset. Per Dr. Oswaldo DoneVincent.  Son verbalized understanding.

## 2017-10-28 NOTE — Telephone Encounter (Signed)
Pt son wants to get back in with Doctor Oswaldo DoneVincent on a med that was suppose to be sent in for his mom for her anxiety. Not something she would take everyday just when she has an panic attack. He states that they talked about it at her visit. Please advise. Pt uses [facility].

## 2017-10-28 NOTE — Telephone Encounter (Signed)
Patient was seen 2/21- please review and advise

## 2017-11-05 DIAGNOSIS — F0390 Unspecified dementia without behavioral disturbance: Secondary | ICD-10-CM | POA: Diagnosis not present

## 2017-11-11 ENCOUNTER — Other Ambulatory Visit: Payer: Self-pay | Admitting: Pediatrics

## 2017-11-11 DIAGNOSIS — I2510 Atherosclerotic heart disease of native coronary artery without angina pectoris: Secondary | ICD-10-CM

## 2017-11-11 DIAGNOSIS — I10 Essential (primary) hypertension: Secondary | ICD-10-CM

## 2017-11-23 NOTE — Progress Notes (Signed)
Subjective: CC: LE swelling PCP: Suzanne Sheriff, MD [name] is a 84 y.o. female presenting to clinic today for:  1. LE swelling Patient is brought to the office by her daughter who notes that she has had right ankle swelling since injury 2 months ago.  They thought this was a sprain.  No imaging was obtained.  She notes some discomfort but she notes that if she walks carefully, she does not have pain.  Denies any changes in sensation.  Not currently using any brace/ medications for issue.   ROS: Per HPI  Allergies  Allergen Reactions  . Azithromycin   . Cephalexin     Causes her to shake  . Ciprofloxacin Nausea And Vomiting and Other (See Comments)    jittery  . Codeine     REACTION: shakiness  . Contrast Media [Iodinated Diagnostic Agents]   . Latex   . Nitrofuran Derivatives   . Nsaids   . Nutritional Supplements   . Penicillins     REACTION: shakiness  . Phenazopyridine   . Quinolones   . Sulfa Antibiotics   . Toviaz [Fesoterodine Fumarate Er]   . Trimethoprim    Past Medical History:  Diagnosis Date  . Anxiety   . COPD (chronic obstructive pulmonary disease) (HCC)   . Coronary atherosclerosis of native coronary artery    DES LAD and BMS LAD 8/08  . Esophageal stricture   . Essential hypertension, benign   . GERD (gastroesophageal reflux disease)   . MI (myocardial infarction) (HCC) 2008  . Mixed hyperlipidemia   . Osteoporosis   . Pelvic fracture (HCC) 08/2014  . Resting tremor   . Rickets     Current Outpatient Medications:  .  atorvastatin (LIPITOR) 20 MG tablet, TAKE ONE TABLET BY MOUTH DAILY, Disp: 90 tablet, Rfl: 1 .  busPIRone (BUSPAR) 15 MG tablet, TAKE ONE TABLET BY MOUTH TWICE DAILY, Disp: 180 tablet, Rfl: 0 .  CITRACAL/VITAMIN D 250-200 MG-UNIT TABS, TAKE ONE TABLET BY MOUTH TWICE DAILY, Disp: 180 each, Rfl: 1 .  clopidogrel (PLAVIX) 75 MG tablet, TAKE ONE TABLET BY MOUTH EVERY DAY, Disp: 30 tablet, Rfl: 3 .  clopidogrel (PLAVIX)  75 MG tablet, TAKE ONE TABLET BY MOUTH EVERY DAY, Disp: 30 tablet, Rfl: 4 .  donepezil (ARICEPT) 5 MG tablet, TAKE ONE TABLET BY MOUTH AT BEDTIME, Disp: 90 tablet, Rfl: 1 .  isosorbide mononitrate (IMDUR) 30 MG 24 hr tablet, TAKE ONE TABLET BY MOUTH TWICE DAILY, Disp: 180 tablet, Rfl: 1 .  lisinopril (PRINIVIL,ZESTRIL) 5 MG tablet, TAKE ONE TABLET BY MOUTH EVERY DAY, Disp: 30 tablet, Rfl: 4 .  nitroGLYCERIN (NITROSTAT) 0.4 MG SL tablet, Place 1 tablet (0.4 mg total) under the tongue every 5 (five) minutes as needed., Disp: 25 tablet, Rfl: 3 .  nystatin cream (MYCOSTATIN), Apply 1 application topically 2 (two) times daily., Disp: 60 g, Rfl: 2 Social History   Socioeconomic History  . Marital status: Widowed    Spouse name: Not on file  . Number of children: Not on file  . Years of education: Not on file  . Highest education level: Not on file  Occupational History  . Not on file  Social Needs  . Financial resource strain: Not on file  . Food insecurity:    Worry: Not on file    Inability: Not on file  . Transportation needs:    Medical: Not on file    Non-medical: Not on file  Tobacco Use  . Smoking  status: Never Smoker  . Smokeless tobacco: Never Used  Substance and Sexual Activity  . Alcohol use: No    Alcohol/week: 0.0 oz  . Drug use: No  . Sexual activity: Never  Lifestyle  . Physical activity:    Days per week: Not on file    Minutes per session: Not on file  . Stress: Not on file  Relationships  . Social connections:    Talks on phone: Not on file    Gets together: Not on file    Attends religious service: Not on file    Active member of club or organization: Not on file    Attends meetings of clubs or organizations: Not on file    Relationship status: Not on file  . Intimate partner violence:    Fear of current or ex partner: Not on file    Emotionally abused: Not on file    Physically abused: Not on file    Forced sexual activity: Not on file  Other Topics  Concern  . Not on file  Social History Narrative  . Not on file   Family History  Problem Relation Age of Onset  . Hypertension Unknown     Objective: Office vital signs reviewed. BP (!) 150/70   Pulse 60   Temp (!) 97.3 F (36.3 C) (Oral)   Ht 5\' 2"  (1.575 m)   Wt 138 lb (62.6 kg)   BMI 25.24 kg/m   Physical Examination:  General: Awake, alert, well nourished, No acute distress Extremities: warm, well perfused, mild nonpitting edema appreciated along the right lateral aspect of the ankle.  No cyanosis or clubbing; +2 pulses bilaterally MSK: slow gait and station  Right ankle: Trace edema appreciated over the ATF and along the right lateral ankle.  She does have tenderness out of proportion to exam over the ATF.  No associated bruising or erythema noted.  No palpable bony abnormalities.  No ligamentous laxity.  She is able to move her foot and ankle independently. Skin: dry; intact; no rashes or lesions Neuro: LE light touch sensation grossly intact  No results found.   Assessment/ Plan: 84 y.o. female   1. Acute right ankle pain Difficult to elicit exactly how she injured her ankle.  X-rays were obtained which upon personal review did not demonstrate any acute fractures or abnormalities.  I suspect that this is a sprain of the ATF given her location of pain and swelling.  She was placed in compression prior to discharge.  Icing recommended.  May use topical analgesic of choice.  May use Tylenol if needed.  Follow-up as needed. - DG Ankle Complete Right; Future  2. Sprain of anterior talofibular ligament of right ankle, initial encounter   Orders Placed This Encounter  Procedures  . DG Ankle Complete Right    Standing Status:   Future    Number of Occurrences:   1    Standing Expiration Date:   01/25/2019    Order Specific Question:   Reason for Exam (SYMPTOM  OR DIAGNOSIS REQUIRED)    Answer:   right ankle pain and swelling s/p after injury.  tenderness out of  proportion to exam over ATF.    Order Specific Question:   Preferred imaging location?    Answer:   Internal    Order Specific Question:   Radiology Contrast Protocol - [name] NOT remove file path    Answer:   [email or web address]   Per request, nitroglycerin sublingual tablets were refilled.  She  will continue to follow-up with her PCP and cardiologist for further management of this issue.   [name], [name] [facility] [phone]

## 2017-11-24 ENCOUNTER — Ambulatory Visit (INDEPENDENT_AMBULATORY_CARE_PROVIDER_SITE_OTHER): Payer: Medicare HMO

## 2017-11-24 ENCOUNTER — Ambulatory Visit (INDEPENDENT_AMBULATORY_CARE_PROVIDER_SITE_OTHER): Payer: Medicare HMO | Admitting: Family Medicine

## 2017-11-24 ENCOUNTER — Encounter: Payer: Self-pay | Admitting: Family Medicine

## 2017-11-24 VITALS — BP 150/70 | HR 60 | Temp 97.3°F | Ht 62.0 in | Wt 138.0 lb

## 2017-11-24 DIAGNOSIS — S93491A Sprain of other ligament of right ankle, initial encounter: Secondary | ICD-10-CM

## 2017-11-24 DIAGNOSIS — M25571 Pain in right ankle and joints of right foot: Secondary | ICD-10-CM | POA: Diagnosis not present

## 2017-11-24 MED ORDER — NITROGLYCERIN 0.4 MG SL SUBL
0.4000 mg | SUBLINGUAL_TABLET | SUBLINGUAL | 0 refills | Status: DC | PRN
Start: 2017-11-24 — End: 2018-12-16

## 2017-11-24 NOTE — Patient Instructions (Signed)
Ankle Sprain  An ankle sprain is a stretch or tear in one of the tough tissues (ligaments) in your ankle.  Follow these instructions at home:   Rest your ankle.   Take over-the-counter and prescription medicines only as told by your doctor.   For 2-3 days, keep your ankle higher than the level of your heart (elevated) as much as possible.   If directed, put ice on the area:  ? Put ice in a plastic bag.  ? Place a towel between your skin and the bag.  ? Leave the ice on for 20 minutes, 2-3 times a day.   If you were given a brace:  ? Wear it as told.  ? Take it off to shower or bathe.  ? Try not to move your ankle much, but wiggle your toes from time to time. This helps to prevent swelling.   If you were given an elastic bandage (dressing):  ? Take it off when you shower or bathe.  ? Try not to move your ankle much, but wiggle your toes from time to time. This helps to prevent swelling.  ? Adjust the bandage to make it more comfortable if it feels too tight.  ? Loosen the bandage if you lose feeling in your foot, your foot tingles, or your foot gets cold and blue.   If you have crutches, use them as told by your doctor. Continue to use them until you can walk without feeling pain in your ankle.  Contact a doctor if:   Your bruises or swelling are quickly getting worse.   Your pain does not get better after you take medicine.  Get help right away if:   You cannot feel your toes or foot.   Your toes or your foot looks blue.   You have very bad pain that gets worse.  This information is not intended to replace advice given to you by your health care provider. Make sure you discuss any questions you have with your health care provider.  Document Released: 02/04/2008 Document Revised: 01/24/2016 Document Reviewed: 03/20/2015  Elsevier Interactive Patient Education  2018 [employer]vier Inc[employer]

## 2017-12-01 ENCOUNTER — Encounter: Payer: Self-pay | Admitting: *Deleted

## 2017-12-01 ENCOUNTER — Ambulatory Visit (INDEPENDENT_AMBULATORY_CARE_PROVIDER_SITE_OTHER): Payer: Medicare HMO | Admitting: *Deleted

## 2017-12-01 VITALS — BP 150/69 | HR 59 | Ht <= 58 in | Wt 134.0 lb

## 2017-12-01 DIAGNOSIS — Z0000 Encounter for general adult medical examination without abnormal findings: Secondary | ICD-10-CM | POA: Diagnosis not present

## 2017-12-01 NOTE — Progress Notes (Addendum)
Subjective:   [name] is a 84 y.o. female who presents for an Initial Medicare Annual Wellness Visit. [name] lives at home with her son. She has 3 sons and 1 living daughter. She had 1 daughter that was stillborn. Her husband passed away at 854 with lung cancer. Her daughter accompanies her today and expresses concern about her declining memory. She has a diagnosis of dementia and is taking Aricept. Her daughter provides most of the medical history. [name] really enjoys spending time with her 82 year old chihuahua and prefers not to go anywhere without her.   Review of Systems    Health is about the same as last year.   Cardiac Risk Factors include: advanced age (>2455men, 28>65 women);sedentary lifestyle;hypertension;dyslipidemia;family history of premature cardiovascular disease     Objective:    Today's Vitals   12/01/17 1129  BP: (!) 150/69  Pulse: (!) 59  Weight: 134 lb (60.8 kg)  Height: 4\' 10"  (1.473 m)   Body mass index is 28.01 kg/m.  Advanced Directives 12/01/2017 03/21/2015 06/12/2014 05/17/2014 09/06/2012 06/06/2012  Does Patient Have a Medical Advance Directive? No No No Yes Patient does not have advance directive Patient has advance directive, copy not in chart  Type of Advance Directive - - Healthcare Power of [employer] Healthcare Power of Attorney - Healthcare Power of Attorney  Copy of Healthcare Power of Attorney in Chart? - - No - copy requested No - copy requested - Copy requested from family  Would patient like information on creating a medical advance directive? No - Patient declined No - patient declined information No - patient declined information - - -  Pre-existing out of facility DNR order (yellow form or pink MOST form) - - - - No -    Current Medications (verified) Outpatient Encounter Medications as of 12/01/2017  Medication Sig  . atorvastatin (LIPITOR) 20 MG tablet TAKE ONE TABLET BY MOUTH DAILY  . busPIRone (BUSPAR) 15 MG tablet TAKE ONE TABLET BY  MOUTH TWICE DAILY  . CITRACAL/VITAMIN D 250-200 MG-UNIT TABS TAKE ONE TABLET BY MOUTH TWICE DAILY  . clopidogrel (PLAVIX) 75 MG tablet TAKE ONE TABLET BY MOUTH EVERY DAY  . clopidogrel (PLAVIX) 75 MG tablet TAKE ONE TABLET BY MOUTH EVERY DAY  . donepezil (ARICEPT) 5 MG tablet TAKE ONE TABLET BY MOUTH AT BEDTIME  . isosorbide mononitrate (IMDUR) 30 MG 24 hr tablet TAKE ONE TABLET BY MOUTH TWICE DAILY  . lisinopril (PRINIVIL,ZESTRIL) 5 MG tablet TAKE ONE TABLET BY MOUTH EVERY DAY  . nitroGLYCERIN (NITROSTAT) 0.4 MG SL tablet Place 1 tablet (0.4 mg total) under the tongue every 5 (five) minutes as needed.  . nystatin cream (MYCOSTATIN) Apply 1 application topically 2 (two) times daily.   No facility-administered encounter medications on file as of 12/01/2017.     Allergies (verified) Azithromycin; Cephalexin; Ciprofloxacin; Codeine; Contrast media [iodinated diagnostic agents]; Latex; Nitrofuran derivatives; Nsaids; Nutritional supplements; Penicillins; Phenazopyridine; Quinolones; Sulfa antibiotics; Toviaz [fesoterodine fumarate er]; and Trimethoprim   History: Past Medical History:  Diagnosis Date  . Anxiety   . COPD (chronic obstructive pulmonary disease) (HCC)   . Coronary atherosclerosis of native coronary artery    DES LAD and BMS LAD 8/08  . Esophageal stricture   . Essential hypertension, benign   . GERD (gastroesophageal reflux disease)   . MI (myocardial infarction) (HCC) 2008  . Mixed hyperlipidemia   . Osteoporosis   . Pelvic fracture (HCC) 08/2014  . Resting tremor   . Rickets  Past Surgical History:  Procedure Laterality Date  . APPENDECTOMY    . Bilateral cataract surgery    . CARDIAC CATHETERIZATION    . CHOLECYSTECTOMY    . CORONARY ANGIOPLASTY WITH STENT PLACEMENT    . Left Sacroiliac Joint Injection  11/27/11   DR. O'TOOLE  . Left Trochanteric Bursa Injection  11/27/11   DR. O'TOOLE  . VEIN LIGATION AND STRIPPING    . YAG LASER APPLICATION  09/06/2012    Procedure: YAG LASER APPLICATION;  Surgeon: Susa Simmonds, MD;  Location: [facility];  Service: Ophthalmology;  Laterality: Left;   Family History  Problem Relation Age of Onset  . Cancer Father        melanoma  . Cancer Sister        breast  . Tremor Sister   . Hypertension Unknown   . Thyroid disease Daughter   . Cancer Son        throat  . Obesity Son   . Hypertension Son   . Peripheral Artery Disease Son   . Healthy Son   . Hypertension Daughter   . Depression Daughter   . GER disease Daughter    Social History   Socioeconomic History  . Marital status: Widowed    Spouse name: Not on file  . Number of children: 5  . Years of education: 11th  . Highest education level: 11th grade  Occupational History    Comment: Housewife  Social Needs  . Financial resource strain: Not hard at all  . Food insecurity:    Worry: Never true    Inability: Never true  . Transportation needs:    Medical: No    Non-medical: No  Tobacco Use  . Smoking status: Never Smoker  . Smokeless tobacco: Never Used  Substance and Sexual Activity  . Alcohol use: No    Alcohol/week: 0.0 oz  . Drug use: No  . Sexual activity: Not Currently  Lifestyle  . Physical activity:    Days per week: 0 days    Minutes per session: 0 min  . Stress: Not at all  Relationships  . Social connections:    Talks on phone: More than three times a week    Gets together: More than three times a week    Attends religious service: Never    Active member of club or organization: No    Attends meetings of clubs or organizations: Never    Relationship status: Widowed  Other Topics Concern  . Not on file  Social History Narrative  . Not on file    Tobacco Counseling No tobacco use  Clinical Intake:  Pain : No/denies pain    Nutritional Status: BMI 25 -29 Overweight Diabetes: No  How often do you need to have someone help you when you read instructions, pamphlets, or other written materials from your  doctor or pharmacy?: 1 - Never What is the last grade level you completed in school?: 11th    Information entered by :: Demetrios Loll, RN   Activities of Daily Living In your present state of health, do you have any difficulty performing the following activities: 12/01/2017  Hearing? N  Vision? N  Comment overdue for eye exam  Difficulty concentrating or making decisions? Y  Comment daughter states that she has some episodes of confusion that are getting worse  Walking or climbing stairs? N  Dressing or bathing? Y  Comment daughter helps some  Doing errands, shopping? Y  Comment son, Kathlene November, does  grocery shopping and her daughter helps with other shopping  Preparing Food and eating ? Y  Comment children prepares food  Using the Toilet? N  In the past six months, have you accidently leaked urine? N  Do you have problems with loss of bowel control? N  Managing your Medications? N  Managing your Finances? N  Housekeeping or managing your Housekeeping? N  Some recent data might be hidden     Immunizations and Health Maintenance Immunization History  Administered Date(s) Administered  . Influenza Whole 06/07/1996  . Influenza, High Dose Seasonal PF 06/25/2017  . Influenza,inj,Quad PF,6+ Mos 06/28/2015  . Influenza-Unspecified 05/03/2016  . Pneumococcal Conjugate-13 11/09/2014  . Pneumococcal Polysaccharide-23 09/02/1995, 09/01/2000  . Pneumococcal-Unspecified 09/01/2010  . Td 12/30/1996  . Tdap 06/01/2012  . Zoster 06/01/2012   Health Maintenance Due  Topic Date Due  . DEXA SCAN  03/20/2017    Patient Care Team: Johna Sheriff, MD as PCP - General (Internal Medicine) Jonelle Sidle, MD (Cardiology)  No hospitalizations, ER visits, or surgeries this past year.      Assessment:   This is a routine wellness examination for [employer].  Hearing/Vision screen No deficits noted during visit.. Eye exam is overdue.   Dietary issues and exercise activities  discussed:  Diet Eats 3 meals a day. Mostly home prepared meals.   Current Exercise Habits: Home exercise routine, Type of exercise: walking, Time (Minutes): 30, Frequency (Times/Week): 5, Weekly Exercise (Minutes/Week): 150, Intensity: Mild, Exercise limited by: orthopedic condition(s)  Goals    . Exercise 150 min/wk Moderate Activity      Depression Screen PHQ 2/9 Scores 12/01/2017 11/24/2017 10/22/2017 09/23/2017 08/17/2017 05/25/2017 04/28/2017  PHQ - 2 Score 0 0 0 0 0 0 2  PHQ- 9 Score - - - - - - 5  Exception Documentation - - - - - - -    Fall Risk Fall Risk  12/01/2017 11/24/2017 10/22/2017 09/23/2017 05/25/2017  Falls in the past year? Yes Yes No No No  Number falls in past yr: 1 1 - - -  Injury with Fall? No No - - -  Comment - - - - -  Risk Factor Category  - - - - -  Risk for fall due to : History of fall(s) - - - -  Follow up Falls prevention discussed - - - -    Is the patient's home free of loose throw rugs in walkways, pet beds, electrical cords, etc?   yes      Grab bars in the bathroom? no. Has a standup shower.       Handrails on the stairs?   yes      Adequate lighting?   yes   Cognitive Function: MMSE - Mini Mental State Exam 02/05/2017 03/21/2015  Not completed: - Unable to complete;Refused  Orientation to time 0 0  Orientation to Place 0 2  Registration 3 3  Attention/ Calculation 0 0  Recall 0 1  Language- name 2 objects 2 2  Language- repeat 1 1  Language- follow 3 step command 1 3  Language- read & follow direction 0 0  Write a sentence 0 0  Copy design 0 0  Total score 7 12    Abnormal exam as expected    Screening Tests Health Maintenance  Topic Date Due  . DEXA SCAN  03/20/2017  . INFLUENZA VACCINE  04/01/2018  . TETANUS/TDAP  06/01/2022  . PNA vac Low Risk Adult  Completed  Plan:  Keep f/u with PCP Increase activity level as tolerated. Aim for 150 minutes a week. Increase socialization a little. Daughter is considering the senior  programs at the recreation department.  Move carefully to avoid falls  I have personally reviewed and noted the following in the patient's chart:   . Medical and social history . Use of alcohol, tobacco or illicit drugs  . Current medications and supplements . Functional ability and status . Nutritional status . Physical activity . Advanced directives . List of other physicians . Hospitalizations, surgeries, and ER visits in previous 12 months . Vitals . Screenings to include cognitive, depression, and falls . Referrals and appointments  In addition, I have reviewed and discussed with patient certain preventive protocols, quality metrics, and best practice recommendations. A written personalized care plan for preventive services as well as general preventive health recommendations were provided to patient.     Demetrios Loll, RN   12/02/2017   I have reviewed and agree with the above AWV documentation.   Mary-Margaret Daphine Deutscher, FNP

## 2017-12-01 NOTE — Patient Instructions (Addendum)
[email or web address]   [name] , Thank you for taking time to come for your Medicare Wellness Visit. I appreciate your ongoing commitment to your health goals. Please review the following plan we discussed and let me know if I can assist you in the future.   These are the goals we discussed: Goals    . Exercise 150 min/wk Moderate Activity       This is a list of the screening recommended for you and due dates:  Health Maintenance  Topic Date Due  . DEXA scan (bone density measurement)  03/20/2017  . Flu Shot  04/01/2018  . Tetanus Vaccine  06/01/2022  . Pneumonia vaccines  Completed

## 2017-12-09 ENCOUNTER — Other Ambulatory Visit: Payer: Self-pay | Admitting: Pediatrics

## 2017-12-09 DIAGNOSIS — M810 Age-related osteoporosis without current pathological fracture: Secondary | ICD-10-CM

## 2018-01-05 DIAGNOSIS — F0390 Unspecified dementia without behavioral disturbance: Secondary | ICD-10-CM | POA: Diagnosis not present

## 2018-01-10 ENCOUNTER — Other Ambulatory Visit: Payer: Self-pay | Admitting: Pediatrics

## 2018-01-10 DIAGNOSIS — F0390 Unspecified dementia without behavioral disturbance: Secondary | ICD-10-CM

## 2018-01-28 ENCOUNTER — Ambulatory Visit: Payer: Medicare HMO | Admitting: Pediatrics

## 2018-01-29 ENCOUNTER — Encounter: Payer: Self-pay | Admitting: Pediatrics

## 2018-02-05 DIAGNOSIS — F0390 Unspecified dementia without behavioral disturbance: Secondary | ICD-10-CM | POA: Diagnosis not present

## 2018-03-11 ENCOUNTER — Encounter: Payer: Self-pay | Admitting: *Deleted

## 2018-03-11 NOTE — Progress Notes (Signed)
Cardiology Office Note  Date: 03/12/2018   ID: [name], DOB Apr 22, 1931, MRN [id number]  PCP: Johna SheriffVincent, Carol L, MD  Primary Cardiologist: Nona DellSamuel McDowell, MD   Chief Complaint  Patient presents with  . Coronary Artery Disease    History of Present Illness: [name] is an 84 y.o. female last seen in January.  She is here today with a caregiver. She does not report any obvious angina symptoms on medical therapy.  Dementia has been a progressive issue and discussion with caregiver today.  She is on Aricept.  I personally reviewed her ECG which shows sinus rhythm with increased voltage.  Nonspecific T wave changes.  Current cardiac regimen includes Plavix, Imdur, lisinopril, and Lipitor.  Past Medical History:  Diagnosis Date  . Anxiety   . COPD (chronic obstructive pulmonary disease) (HCC)   . Coronary atherosclerosis of native coronary artery    DES LAD and BMS LAD 8/08  . Esophageal stricture   . Essential hypertension, benign   . GERD (gastroesophageal reflux disease)   . MI (myocardial infarction) (HCC) 2008  . Mixed hyperlipidemia   . Osteoporosis   . Pelvic fracture (HCC) 08/2014  . Resting tremor   . Rickets     Past Surgical History:  Procedure Laterality Date  . APPENDECTOMY    . Bilateral cataract surgery    . CARDIAC CATHETERIZATION    . CHOLECYSTECTOMY    . CORONARY ANGIOPLASTY WITH STENT PLACEMENT    . Left Sacroiliac Joint Injection  11/27/11   DR. O'TOOLE  . Left Trochanteric Bursa Injection  11/27/11   DR. O'TOOLE  . VEIN LIGATION AND STRIPPING    . YAG LASER APPLICATION  09/06/2012   Procedure: YAG LASER APPLICATION;  Surgeon: Susa Simmondsarroll F Haines, MD;  Location: [facility];  Service: Ophthalmology;  Laterality: Left;    Current Outpatient Medications  Medication Sig Dispense Refill  . atorvastatin (LIPITOR) 20 MG tablet TAKE ONE TABLET BY MOUTH DAILY 90 tablet 1  . busPIRone (BUSPAR) 15 MG tablet TAKE ONE TABLET BY MOUTH TWICE DAILY 180  tablet 0  . CITRACAL/VITAMIN D 250-200 MG-UNIT TABS TAKE ONE TABLET BY MOUTH TWICE DAILY 180 each 1  . clopidogrel (PLAVIX) 75 MG tablet TAKE ONE TABLET BY MOUTH EVERY DAY 30 tablet 4  . donepezil (ARICEPT) 5 MG tablet TAKE ONE TABLET BY MOUTH AT BEDTIME 90 tablet 0  . isosorbide mononitrate (IMDUR) 30 MG 24 hr tablet TAKE ONE TABLET BY MOUTH TWICE DAILY 180 tablet 1  . lisinopril (PRINIVIL,ZESTRIL) 5 MG tablet TAKE ONE TABLET BY MOUTH EVERY DAY 30 tablet 4  . nitroGLYCERIN (NITROSTAT) 0.4 MG SL tablet Place 1 tablet (0.4 mg total) under the tongue every 5 (five) minutes as needed. 25 tablet 0  . nystatin cream (MYCOSTATIN) Apply 1 application topically 2 (two) times daily. 60 g 2   No current facility-administered medications for this visit.    Allergies:  Azithromycin; Cephalexin; Ciprofloxacin; Codeine; Contrast media [iodinated diagnostic agents]; Latex; Nitrofuran derivatives; Nsaids; Nutritional supplements; Penicillins; Phenazopyridine; Quinolones; Sulfa antibiotics; Toviaz [fesoterodine fumarate er]; and Trimethoprim   Social History: The patient  reports that she has never smoked. She has never used smokeless tobacco. She reports that she does not drink alcohol or use drugs. \  ROS:  Please see the history of present illness. Otherwise, complete review of systems is positive for none.  All other systems are reviewed and negative.   Physical Exam: VS:  BP (!) 102/58   Pulse 66  Ht 5\' 2"  (1.575 m)   Wt 131 lb (59.4 kg)   SpO2 97%   BMI 23.96 kg/m , BMI Body mass index is 23.96 kg/m.  Wt Readings from Last 3 Encounters:  03/12/18 131 lb (59.4 kg)  12/01/17 134 lb (60.8 kg)  11/24/17 138 lb (62.6 kg)    General: Elderly woman, appears comfortable at rest. HEENT: Conjunctiva and lids normal, oropharynx clear. Neck: Supple, no elevated JVP or carotid bruits, no thyromegaly. Lungs: Clear to auscultation, nonlabored breathing at rest. Cardiac: Regular rate and rhythm, no S3 or  significant systolic murmur. Abdomen: Soft, nontender, bowel sounds present,. Extremities: No pitting edema, distal pulses 2+.  ECG: I personally reviewed the tracing from 01/22/2017 which showed normal sinus rhythm.  Recent Labwork: 06/09/2017: ALT 10; AST 19; BUN 13; Creatinine, Ser 1.02; Potassium 3.9; Sodium 137     Component Value Date/Time   CHOL 146 11/09/2014 1523   TRIG 88 11/09/2014 1523   HDL 72 11/09/2014 1523   CHOLHDL 2.0 11/09/2014 1523   CHOLHDL 2.1 06/07/2012 0528   VLDL 17 06/07/2012 0528   LDLCALC 56 11/09/2014 1523   Other Studies Reviewed Today:  Cardiac catheterization 06/20/2011: HEMODYNAMIC DATA:Aortic pressure is 139/60 with a mean of 91 mmHg. Left ventricular pressure is 137 with EDP of 10 mmHg.  ANGIOGRAPHIC DATA:The left coronary artery arises and distributes normally.The left main coronary artery is normal.  The left anterior descending artery demonstrates that the proximal and mid to distal vessel stents are widely patent.Between the stents in the mid vessel, there is diffuse 30% narrowing.There is a small first diagonal branch that has a focal 70% stenosis.This is less than 1.5 mm in diameter.  Left circumflex coronary artery distributes normally.It gives rise to 3 marginal branches.In the midvessel, there is diffuse 40% disease in a segmental fashion.  The right coronary arises and distributes normally.There is segmental 30% disease in the midvessel.  The left ventricular angiography performed in the RAO view demonstrates normal left ventricular size and contractility with normal ejection fraction of 60%.  FINAL IMPRESSION: 1. Single-vessel obstructive coronary artery disease with a modest lesion involving a small diagonal branch.The stents in the proximal and mid to distal left anterior descending are still patent.Compared to her prior cardiac catheterization, there is no significant change 2.  Normal left ventricular function.  Assessment and Plan:  1.  CAD status post DES and BMS interventions to the LAD.  We are managing her conservatively at this point.  Medical therapy is stable and she reports no obvious angina symptoms.  ECG reviewed.  2.  Progressive dementia, on Aricept.  3.  Essential hypertension, blood pressure is low normal today.  Current medicines were reviewed with the patient today.   Orders Placed This Encounter  Procedures  . EKG 12-Lead    Disposition: Follow-up in 6 months.  Signed, Jonelle Sidle, MD, [facility] 03/12/2018 4:39 PM    [facility] [address] [place], [place], [place] [address] Phone: [phone]; Fax: [phone]

## 2018-03-12 ENCOUNTER — Ambulatory Visit (INDEPENDENT_AMBULATORY_CARE_PROVIDER_SITE_OTHER): Payer: Medicare HMO | Admitting: Cardiology

## 2018-03-12 ENCOUNTER — Encounter: Payer: Self-pay | Admitting: Cardiology

## 2018-03-12 VITALS — BP 102/58 | HR 66 | Ht 62.0 in | Wt 131.0 lb

## 2018-03-12 DIAGNOSIS — I10 Essential (primary) hypertension: Secondary | ICD-10-CM

## 2018-03-12 DIAGNOSIS — I25119 Atherosclerotic heart disease of native coronary artery with unspecified angina pectoris: Secondary | ICD-10-CM

## 2018-03-12 NOTE — Patient Instructions (Signed)

## 2018-03-25 ENCOUNTER — Ambulatory Visit: Payer: Medicare HMO

## 2018-03-26 ENCOUNTER — Encounter: Payer: Self-pay | Admitting: Pediatrics

## 2018-03-26 ENCOUNTER — Ambulatory Visit (INDEPENDENT_AMBULATORY_CARE_PROVIDER_SITE_OTHER): Payer: Medicare HMO | Admitting: Pediatrics

## 2018-03-26 VITALS — BP 120/62 | HR 56 | Temp 96.0°F | Ht 62.0 in | Wt 132.6 lb

## 2018-03-26 DIAGNOSIS — F0391 Unspecified dementia with behavioral disturbance: Secondary | ICD-10-CM

## 2018-03-26 DIAGNOSIS — R399 Unspecified symptoms and signs involving the genitourinary system: Secondary | ICD-10-CM

## 2018-03-26 NOTE — Progress Notes (Signed)
  Subjective:   Patient ID: [name], female    DOB: [birth year], 84 y.o.   MRN: [id number] CC: urinary frequency HPI: [name] is a 84 y.o. female   Here today with daughter [name]. Having increased urinary frequency past week.  No dysuria, no abdominal pain.  Her appetite is been fine.  No fevers.    Her mood has been off as well. She has been grieving her 82yo dog who died 2 weeks ago. Daughter says she will have episodes of crying, usually happen in the afternoon. Daughter usually with her during the day several days a week.  They are getting her a new dog this next week.   Patient says she gets sad sometimes.  She thinks overall her mood has been fine.  Relevant past medical, surgical, family and social history reviewed. Allergies and medications reviewed and updated. Social History   Tobacco Use  Smoking Status Never Smoker  Smokeless Tobacco Never Used   ROS: Per HPI   Objective:    BP 120/62   Pulse (!) 56   Temp (!) 96 F (35.6 C) (Oral)   Ht 5\' 2"  (1.575 m)   Wt 132 lb 9.6 oz (60.1 kg)   BMI 24.25 kg/m   Wt Readings from Last 3 Encounters:  03/26/18 132 lb 9.6 oz (60.1 kg)  03/12/18 131 lb (59.4 kg)  12/01/17 134 lb (60.8 kg)    Gen: NAD, alert, very pleasant, cooperative with exam, NCAT, answers questions appropriately EYES: EOMI, no conjunctival injection, or no icterus ENT:  OP without erythema LYMPH: no cervical LAD CV: NRRR, normal S1/S2, no murmur, distal pulses 2+ b/l Resp: CTABL, no wheezes, normal WOB Abd: +BS, soft, NTND. no guarding or organomegaly, no CVA tenderness Ext: No edema, warm Neuro: Alert, appropriate strength equal b/l UE and LE, coordination grossly normal MSK: normal muscle bulk  Assessment & Plan:  84 year old female with urinary frequency last few days.  Diagnoses and all orders for this visit:  UTI symptoms Urinary frequency during the day for the last few days.  No other urinary symptoms.  Patient not able to leave  a urine sample now, will come back later today to leave one. -     Urinalysis, Complete -     Urine Culture; Future  Dementia with behavioral disturbance, unspecified dementia type Some more emotional lability.  Usually worse in the afternoon.  This has been going on, possibly off and on the daughter is not sure, last few months.  Daughter says she is not sure how patient acts in the evening because she is not there, patient son is with her at that time.  Normal exam, affect now.  If mood truly does seem to be down, could consider medicine such as mirtazapine.  Daughter going to discuss with her siblings who also care for the pt, regarding what they have noticed about her mood and behavior.  Follow up plan: Return in about 3 weeks (around 04/16/2018). Rex Krasarol Vincent, MD [facility]

## 2018-03-29 ENCOUNTER — Other Ambulatory Visit: Payer: Medicare HMO

## 2018-03-29 DIAGNOSIS — R399 Unspecified symptoms and signs involving the genitourinary system: Secondary | ICD-10-CM | POA: Diagnosis not present

## 2018-03-29 LAB — URINALYSIS, COMPLETE
BILIRUBIN UA: NEGATIVE
Glucose, UA: NEGATIVE
Ketones, UA: NEGATIVE
Nitrite, UA: POSITIVE — AB
PH UA: 6.5 (ref 5.0–7.5)
PROTEIN UA: NEGATIVE
SPEC GRAV UA: 1.025 (ref 1.005–1.030)
Urobilinogen, Ur: 0.2 mg/dL (ref 0.2–1.0)

## 2018-03-29 LAB — MICROSCOPIC EXAMINATION: RBC, UA: NONE SEEN /hpf (ref 0–2)

## 2018-03-29 NOTE — Addendum Note (Signed)
Addended by: Prescott GumLAND, Tyson Parkison M on: 03/29/2018 12:12 PM   Modules accepted: Orders

## 2018-03-31 ENCOUNTER — Other Ambulatory Visit: Payer: Self-pay | Admitting: Pediatrics

## 2018-03-31 DIAGNOSIS — N3090 Cystitis, unspecified without hematuria: Secondary | ICD-10-CM

## 2018-03-31 LAB — URINE CULTURE

## 2018-03-31 MED ORDER — DOXYCYCLINE HYCLATE 100 MG PO TABS: 100.0000 mg | ORAL_TABLET | Freq: Two times a day (BID) | ORAL | 0 refills | Status: DC

## 2018-04-01 ENCOUNTER — Other Ambulatory Visit: Payer: Self-pay | Admitting: Pediatrics

## 2018-04-07 ENCOUNTER — Other Ambulatory Visit: Payer: Self-pay | Admitting: Pediatrics

## 2018-04-07 DIAGNOSIS — E785 Hyperlipidemia, unspecified: Secondary | ICD-10-CM

## 2018-04-07 DIAGNOSIS — F0390 Unspecified dementia without behavioral disturbance: Secondary | ICD-10-CM

## 2018-04-07 DIAGNOSIS — I2510 Atherosclerotic heart disease of native coronary artery without angina pectoris: Secondary | ICD-10-CM

## 2018-04-07 DIAGNOSIS — I10 Essential (primary) hypertension: Secondary | ICD-10-CM

## 2018-04-07 NOTE — Telephone Encounter (Signed)
Last seen 03/26/18   Last lipid 11/09/14

## 2018-04-15 DIAGNOSIS — F0390 Unspecified dementia without behavioral disturbance: Secondary | ICD-10-CM | POA: Diagnosis not present

## 2018-04-16 ENCOUNTER — Encounter: Payer: Self-pay | Admitting: Pediatrics

## 2018-04-16 ENCOUNTER — Ambulatory Visit (INDEPENDENT_AMBULATORY_CARE_PROVIDER_SITE_OTHER): Payer: Medicare HMO | Admitting: Pediatrics

## 2018-04-16 VITALS — BP 131/64 | HR 58 | Temp 97.6°F | Ht 62.0 in | Wt 135.4 lb

## 2018-04-16 DIAGNOSIS — M810 Age-related osteoporosis without current pathological fracture: Secondary | ICD-10-CM | POA: Diagnosis not present

## 2018-04-16 DIAGNOSIS — F419 Anxiety disorder, unspecified: Secondary | ICD-10-CM | POA: Diagnosis not present

## 2018-04-16 DIAGNOSIS — F0390 Unspecified dementia without behavioral disturbance: Secondary | ICD-10-CM | POA: Diagnosis not present

## 2018-04-16 DIAGNOSIS — F0391 Unspecified dementia with behavioral disturbance: Secondary | ICD-10-CM | POA: Diagnosis not present

## 2018-04-16 MED ORDER — CLONAZEPAM 0.5 MG PO TABS: 0.5000 mg | ORAL_TABLET | Freq: Every day | ORAL | 1 refills | Status: DC | PRN

## 2018-04-16 NOTE — Progress Notes (Signed)
Subjective:   Patient ID: [name], female    DOB: 09-Sep-1930, 84 y.o.   MRN: [id number] CC: Dementia (3 week follow up)  HPI: [name] is a 84 y.o. female   Here today with her daughter [name] whom I have met previously.  Her son [name] has power of attorney.  Her children take turns staying with her, she is living at home right now.  Daughter says she continues to have periods of being sad and crying or being "mean and hateful" in the afternoon or evening.  Typically one of her brothers is with her in the evening.  Patient says sometimes she does get upset or sad, she thinks it is part of her normal behavior, she does not think it has been getting any worse.  Patient says her mood is been okay.  Daughter test says sometimes she gets very worked up, thinks that she is not in her house that she is, gets upset her or her siblings interfering with her affairs or not recognizing them as her children, or being too demanding with her follow-through.  Mornings are usually her best time.   Patient says the person with her is not her daughter.  She does not know who the woman is but she is paid to stay with her.  Patient does say she has 4 children and is able to name them all including her daughter [name].  Patient says she does feel safe at home, she does not think anybody is trying to hurt her.  She is always been a nervous person, sometimes her nerves get to be too much.  Patient still morning her small dog who recently died.  Relevant past medical, surgical, family and social history reviewed. Allergies and medications reviewed and updated. Social History   Tobacco Use  Smoking Status Never Smoker  Smokeless Tobacco Never Used   ROS: Per HPI   Objective:    BP 131/64   [name] (!) 58   Temp 97.6 F (36.4 C) (Oral)   Ht _0  (1.575 m)   Wt 135 lb 6.4 oz (61.4 kg)   BMI 24.76 kg/m   Wt Readings from Last 3 Encounters:  04/16/18 135 lb 6.4 oz (61.4 kg)  03/26/18 132 lb 9.6 oz  (60.1 kg)  03/12/18 131 lb (59.4 kg)    Gen: NAD, alert, cooperative with exam, NCAT EYES: EOMI, no conjunctival injection, or no icterus ENT: OP without erythema CV: NRRR, normal S1/S2, no murmur, distal pulses 2+ b/l Resp: CTABL, no wheezes, normal WOB Abd: +BS, soft, NTND. no guarding or organomegaly Ext: No edema, warm  Neuro: Alert and oriented to self and place, doesn't recognize her daughter  Assessment & Plan:  [name] was seen today for dementia.  Diagnoses and all orders for this visit:  Osteoporosis, unspecified osteoporosis type, unspecified pathological fracture presence Gets zoledronic acid once yearly, due October 2018. -     TSH -     CMP14+EGFR -     VITAMIN D 25 Hydroxy (Vit-D Deficiency, Fractures)  Anxiety -     clonazePAM (KLONOPIN) 0.5 MG tablet; Take 1 tablet (0.5 mg total) by mouth daily as needed for anxiety.  Dementia with behavioral disturbance, unspecified dementia type Daughter continuing to try to educate herself and family regarding dementia so they can best take care of her at home.  They do want to keep patient at home as that is where patient wants to stay.  Someone is always with her right now.  When pt gets really anxious about not recognizing things around her and worked up it is hard.  Continue to discuss best treatment for dementia is behavioral modifications, redirecting, keeping familiar objects around.  Daughter starting classes later this fall through [facility] [employer] [facility] which she is looking forward to.    Patient is sleeping well at night now.  She remains very anxious at times, tend to come in spells.  Will do trial of low-dose Klonopin as above, take only as needed, ideally only a couple times a week. I discussed risks of treatment including falls, worsening memory problems.  Any worsening they are to stop and let me know.  I spent 25 minutes with the patient with over 50% of the encounter time dedicated to counseling on the above  problems.   Follow up plan: Return in about 4 weeks (around 05/14/2018). Assunta Found, MD [place]

## 2018-04-17 ENCOUNTER — Encounter: Payer: Self-pay | Admitting: Pediatrics

## 2018-04-17 LAB — CMP14+EGFR
A/G RATIO: 1.7 (ref 1.2–2.2)
ALBUMIN: 4 g/dL (ref 3.5–4.7)
ALT: 6 IU/L (ref 0–32)
AST: 17 IU/L (ref 0–40)
Alkaline Phosphatase: 57 IU/L (ref 39–117)
BUN / CREAT RATIO: 13 (ref 12–28)
BUN: 13 mg/dL (ref 8–27)
Bilirubin Total: 0.7 mg/dL (ref 0.0–1.2)
CALCIUM: 9.3 mg/dL (ref 8.7–10.3)
CO2: 24 mmol/L (ref 20–29)
Chloride: 103 mmol/L (ref 96–106)
Creatinine, Ser: 1.02 mg/dL — ABNORMAL HIGH (ref 0.57–1.00)
GFR, EST AFRICAN AMERICAN: 58 mL/min/{1.73_m2} — AB (ref >59)
GFR, EST NON AFRICAN AMERICAN: 50 mL/min/{1.73_m2} — AB (ref >59)
GLOBULIN, TOTAL: 2.3 g/dL (ref 1.5–4.5)
Glucose: 102 mg/dL — ABNORMAL HIGH (ref 65–99)
POTASSIUM: 4.7 mmol/L (ref 3.5–5.2)
SODIUM: 139 mmol/L (ref 134–144)
TOTAL PROTEIN: 6.3 g/dL (ref 6.0–8.5)

## 2018-04-17 LAB — VITAMIN D 25 HYDROXY (VIT D DEFICIENCY, FRACTURES): Vit D, 25-Hydroxy: 29.2 ng/mL — ABNORMAL LOW (ref 30.0–100.0)

## 2018-04-17 LAB — TSH: TSH: 3.61 u[IU]/mL (ref 0.450–4.500)

## 2018-05-06 DIAGNOSIS — Z79899 Other long term (current) drug therapy: Secondary | ICD-10-CM | POA: Diagnosis not present

## 2018-05-06 DIAGNOSIS — R479 Unspecified speech disturbances: Secondary | ICD-10-CM | POA: Diagnosis not present

## 2018-05-06 DIAGNOSIS — Z7902 Long term (current) use of antithrombotics/antiplatelets: Secondary | ICD-10-CM | POA: Diagnosis not present

## 2018-05-06 DIAGNOSIS — W1839XA Other fall on same level, initial encounter: Secondary | ICD-10-CM | POA: Diagnosis not present

## 2018-05-06 DIAGNOSIS — M549 Dorsalgia, unspecified: Secondary | ICD-10-CM | POA: Diagnosis not present

## 2018-05-06 DIAGNOSIS — I10 Essential (primary) hypertension: Secondary | ICD-10-CM | POA: Diagnosis not present

## 2018-05-06 DIAGNOSIS — R1110 Vomiting, unspecified: Secondary | ICD-10-CM | POA: Diagnosis not present

## 2018-05-06 DIAGNOSIS — E7800 Pure hypercholesterolemia, unspecified: Secondary | ICD-10-CM | POA: Diagnosis not present

## 2018-05-06 DIAGNOSIS — R410 Disorientation, unspecified: Secondary | ICD-10-CM | POA: Diagnosis not present

## 2018-05-06 DIAGNOSIS — R531 Weakness: Secondary | ICD-10-CM | POA: Diagnosis not present

## 2018-05-06 DIAGNOSIS — F0390 Unspecified dementia without behavioral disturbance: Secondary | ICD-10-CM | POA: Diagnosis not present

## 2018-05-06 DIAGNOSIS — K219 Gastro-esophageal reflux disease without esophagitis: Secondary | ICD-10-CM | POA: Diagnosis not present

## 2018-05-07 ENCOUNTER — Telehealth: Payer: Self-pay | Admitting: Pediatrics

## 2018-05-07 DIAGNOSIS — R1110 Vomiting, unspecified: Secondary | ICD-10-CM | POA: Diagnosis not present

## 2018-05-07 NOTE — Telephone Encounter (Signed)
Patient is on Aricept 5 mg, son aware

## 2018-05-10 ENCOUNTER — Ambulatory Visit (INDEPENDENT_AMBULATORY_CARE_PROVIDER_SITE_OTHER): Payer: Medicare HMO | Admitting: Family Medicine

## 2018-05-10 ENCOUNTER — Encounter: Payer: Self-pay | Admitting: Family Medicine

## 2018-05-10 VITALS — BP 140/63 | HR 62 | Temp 98.3°F | Ht 62.0 in | Wt 134.2 lb

## 2018-05-10 DIAGNOSIS — F0280 Dementia in other diseases classified elsewhere without behavioral disturbance: Secondary | ICD-10-CM | POA: Diagnosis not present

## 2018-05-10 DIAGNOSIS — G3183 Dementia with Lewy bodies: Secondary | ICD-10-CM

## 2018-05-10 MED ORDER — DONEPEZIL HCL 10 MG PO TABS: 10.0000 mg | ORAL_TABLET | Freq: Every day | ORAL | 0 refills | Status: DC

## 2018-05-10 NOTE — Progress Notes (Signed)
Subjective:  Patient ID: [name], female    DOB: 1930/09/18  Age: 84 y.o. MRN: [id number]  CC: ER follow up (pt here today following up after going to ED for what she thought was "mini stroke")   HPI [name] presents for emergency department follow-up.  She was seen in the [facility] department 3 days ago due to a spell in which she became incoherent and unable to communicate.  This lasted for about an hour and then she returned to her baseline.  Evaluation in the emergency department including CT scan was negative for acute stroke.  There was never any focal weakness of an arm or leg.  No facial paresis was noted as well.  Patient's history is entirely" I have always been a nervous person.  "Her daughter is with her and gives the history.  She states that the patient has been jittery and shaky for several years.  And agrees that the patient has always been nervous.  The patient is essentially unresponsive to direct questioning.  Depression screen [facility] 2/9 04/16/2018 03/26/2018 12/01/2017  Decreased Interest 3 0 0  Down, Depressed, Hopeless 3 0 0  PHQ - 2 Score 6 0 0  Altered sleeping 0 - -  Tired, decreased energy 1 - -  Change in appetite 0 - -  Feeling bad or failure about yourself  1 - -  Trouble concentrating 1 - -  Moving slowly or fidgety/restless 1 - -  Suicidal thoughts 0 - -  PHQ-9 Score 10 - -  Difficult doing work/chores Somewhat difficult - -  Some recent data might be hidden    History Everlena has a past medical history of Anxiety, COPD (chronic obstructive pulmonary disease) (HCC), Coronary atherosclerosis of native coronary artery, Esophageal stricture, Essential hypertension, benign, GERD (gastroesophageal reflux disease), MI (myocardial infarction) (HCC) (2008), Mixed hyperlipidemia, Osteoporosis, Pelvic fracture (HCC) (08/2014), Resting tremor, and Rickets.   She has a past surgical history that includes Appendectomy; Cholecystectomy;  Bilateral cataract surgery; Vein ligation and stripping; Cardiac catheterization; Coronary angioplasty with stent; Left Trochanteric Bursa Injection (11/27/11); Left Sacroiliac Joint Injection (11/27/11); and Yag laser application (09/06/2012).   Her family history includes Cancer in her father, sister, and son; Depression in her daughter; GER disease in her daughter; Healthy in her son; Hypertension in her daughter, son, and unknown relative; Obesity in her son; Peripheral Artery Disease in her son; Thyroid disease in her daughter; Tremor in her sister.She reports that she has never smoked. She has never used smokeless tobacco. She reports that she does not drink alcohol or use drugs.    ROS Review of Systems  Unable to perform ROS: Dementia    Objective:  BP 140/63   Pulse 62   Temp 98.3 F (36.8 C) (Oral)   Ht 5\' 2"  (1.575 m)   Wt 134 lb 4 oz (60.9 kg)   BMI 24.55 kg/m   BP Readings from Last 3 Encounters:  05/10/18 140/63  04/16/18 131/64  03/26/18 120/62    Wt Readings from Last 3 Encounters:  05/10/18 134 lb 4 oz (60.9 kg)  04/16/18 135 lb 6.4 oz (61.4 kg)  03/26/18 132 lb 9.6 oz (60.1 kg)     Physical Exam  Constitutional: She is oriented to person, place, and time. She appears well-developed and well-nourished. No distress.  HENT:  Head: Normocephalic and atraumatic.  Right Ear: External ear normal.  Left Ear: External ear normal.  Nose: Nose normal.  Mouth/Throat: Oropharynx is  clear and moist.  Eyes: Pupils are equal, round, and reactive to light. Conjunctivae and EOM are normal.  Neck: Normal range of motion. Neck supple. No thyromegaly present.  Cardiovascular: Normal rate, regular rhythm and normal heart sounds.  No murmur heard. Pulmonary/Chest: Effort normal and breath sounds normal. No respiratory distress. She has no wheezes. She has no rales.  Abdominal: Soft. There is no tenderness.  Lymphadenopathy:    She has no cervical adenopathy.  Neurological:  She is alert and oriented to person, place, and time. She has normal reflexes.  Skin: Skin is warm and dry.  Psychiatric: Her speech is normal. Thought content normal. Her mood appears anxious. She is slowed. Cognition and memory are impaired. She expresses impulsivity.      Assessment & Plan:   Alejandrina was seen today for er follow up.  Diagnoses and all orders for this visit:  Lewy body dementia without behavioral disturbance  Other orders -     donepezil (ARICEPT) 10 MG tablet; Take 1 tablet (10 mg total) by mouth at bedtime.       I have discontinued Islay Mandell's donepezil. I am also having her start on donepezil. Additionally, I am having her maintain her nystatin cream, nitroGLYCERIN, CITRACAL/VITAMIN D, busPIRone, isosorbide mononitrate, lisinopril, atorvastatin, clopidogrel, and clonazePAM.  Allergies as of 05/10/2018      Reactions   Azithromycin    Cephalexin    Causes her to shake   Ciprofloxacin Nausea And Vomiting, Other (See Comments)   jittery   Codeine    REACTION: shakiness   Contrast Media [iodinated Diagnostic Agents]    Latex    Nitrofuran Derivatives    Nsaids    Nutritional Supplements    Penicillins    REACTION: shakiness   Phenazopyridine    Quinolones    Sulfa Antibiotics    Toviaz [fesoterodine Fumarate Er]    Trimethoprim       Medication List        Accurate as of 05/10/18  2:46 PM. Always use your most recent med list.          atorvastatin 20 MG tablet Commonly known as:  LIPITOR TAKE ONE TABLET BY MOUTH DAILY   busPIRone 15 MG tablet Commonly known as:  BUSPAR TAKE 1 TABLET BY MOUTH TWICE DAILY   CITRACAL/VITAMIN D 250-200 MG-UNIT Tabs Generic drug:  Calcium Citrate-Vitamin D TAKE ONE TABLET BY MOUTH TWICE DAILY   clonazePAM 0.5 MG tablet Commonly known as:  KLONOPIN Take 1 tablet (0.5 mg total) by mouth daily as needed for anxiety.   clopidogrel 75 MG tablet Commonly known as:  PLAVIX TAKE ONE TABLET BY MOUTH  EVERY DAY   donepezil 10 MG tablet Commonly known as:  ARICEPT Take 1 tablet (10 mg total) by mouth at bedtime.   isosorbide mononitrate 30 MG 24 hr tablet Commonly known as:  IMDUR TAKE ONE TABLET BY MOUTH TWICE DAILY   lisinopril 5 MG tablet Commonly known as:  PRINIVIL,ZESTRIL TAKE ONE TABLET BY MOUTH EVERY DAY   nitroGLYCERIN 0.4 MG SL tablet Commonly known as:  NITROSTAT Place 1 tablet (0.4 mg total) under the tongue every 5 (five) minutes as needed.   nystatin cream Commonly known as:  MYCOSTATIN Apply 1 application topically 2 (two) times daily.      Due to the tremor the possibility of Lewy body dementia is addressed as well.  Definitive work-up per Dr. Oswaldo Done on follow-up.  Follow-up: Return in about 2 weeks (around 05/24/2018) for Dementia,  with Dr. Oswaldo Done.  Mechele Claude, M.D.

## 2018-05-16 DIAGNOSIS — F0390 Unspecified dementia without behavioral disturbance: Secondary | ICD-10-CM | POA: Diagnosis not present

## 2018-05-20 ENCOUNTER — Other Ambulatory Visit: Payer: Self-pay | Admitting: Family Medicine

## 2018-05-20 ENCOUNTER — Ambulatory Visit: Payer: Medicare HMO | Admitting: Pediatrics

## 2018-05-25 ENCOUNTER — Ambulatory Visit (INDEPENDENT_AMBULATORY_CARE_PROVIDER_SITE_OTHER): Payer: Medicare HMO | Admitting: Pediatrics

## 2018-05-25 ENCOUNTER — Encounter: Payer: Self-pay | Admitting: Pediatrics

## 2018-05-25 VITALS — BP 139/65 | HR 57 | Temp 97.1°F | Ht 62.0 in | Wt 133.4 lb

## 2018-05-25 DIAGNOSIS — F0391 Unspecified dementia with behavioral disturbance: Secondary | ICD-10-CM

## 2018-05-25 DIAGNOSIS — R2681 Unsteadiness on feet: Secondary | ICD-10-CM | POA: Diagnosis not present

## 2018-05-25 MED ORDER — RISPERIDONE 0.25 MG PO TABS: ORAL_TABLET | ORAL | 1 refills | Status: DC

## 2018-05-25 NOTE — Progress Notes (Signed)
  Subjective:   Patient ID: [name], female    DOB: 1931-08-02, 84 y.o.   MRN: [id number] CC: Dementia (2 week follow up)  HPI: [name] is a 84 y.o. female   Here with daughter [name]. Has taken clonazepam a few times, does help some with anxiety. Continues to have episodes of agitation, sometimes aggressiveness every afternoon/evening.   Pt excited about new dog at home.   Has been sleeping ok. Patient says she has been feeling well, no complaints.  At times has been unsteady on her feet. Daughter says not at times she has taken clonazepam.  Relevant past medical, surgical, family and social history reviewed. Allergies and medications reviewed and updated. Social History   Tobacco Use  Smoking Status Never Smoker  Smokeless Tobacco Never Used   ROS: Per HPI   Objective:    BP 139/65   Pulse (!) 57   Temp (!) 97.1 F (36.2 C) (Oral)   Ht 5\' 2"  (1.575 m)   Wt 133 lb 6.4 oz (60.5 kg)   BMI 24.40 kg/m   Wt Readings from Last 3 Encounters:  05/25/18 133 lb 6.4 oz (60.5 kg)  05/10/18 134 lb 4 oz (60.9 kg)  04/16/18 135 lb 6.4 oz (61.4 kg)    Gen: NAD, alert, cooperative with exam, NCAT, pleasant, interactive,  EYES: EOMI, no conjunctival injection, or no icterus ENT:   OP without erythema CV: NRRR, normal S1/S2 Resp: CTABL, no wheezes, normal WOB Abd: +BS, soft, NTND. no guarding or organomegaly Ext: No edema, warm Neuro: Alert and oriented to self, place. answers simple questions appropriately  Assessment & Plan:  Suzanne LeatherwoodKatherine was seen today for dementia.  Diagnoses and all orders for this visit:  Dementia with behavioral disturbance, unspecified dementia type Needing more regular assistance with aggressive behavior and agitation. Stop clonazepam. Start below. Take as needed. Any worsening s/e let us know. Offered referral to neurology vs HHPT, declines neurology referral for now, open to PT. -     risperiDONE (RISPERDAL) 0.25 MG tablet; Take in the  afternoon as needed for agitiation   Follow up plan: Return in about 4 weeks (around 06/22/2018). sooner prn Rex Krasarol Norvel Wenker, MD [facility]

## 2018-06-15 DIAGNOSIS — F0390 Unspecified dementia without behavioral disturbance: Secondary | ICD-10-CM | POA: Diagnosis not present

## 2018-06-21 ENCOUNTER — Telehealth: Payer: Self-pay | Admitting: Pediatrics

## 2018-06-22 MED ORDER — CALCIUM CITRATE-VITAMIN D 200-250 MG-UNIT PO TABS: 1.0000 | ORAL_TABLET | Freq: Two times a day (BID) | ORAL | 1 refills | Status: DC

## 2018-06-22 NOTE — Telephone Encounter (Signed)
OK to take. I sent in refill.

## 2018-06-22 NOTE — Telephone Encounter (Signed)
Pharmacy notified.

## 2018-07-01 ENCOUNTER — Ambulatory Visit: Payer: Medicare HMO | Admitting: Pediatrics

## 2018-07-11 ENCOUNTER — Other Ambulatory Visit: Payer: Self-pay | Admitting: Pediatrics

## 2018-07-11 DIAGNOSIS — F0391 Unspecified dementia with behavioral disturbance: Secondary | ICD-10-CM

## 2018-07-11 DIAGNOSIS — E785 Hyperlipidemia, unspecified: Secondary | ICD-10-CM

## 2018-07-23 ENCOUNTER — Other Ambulatory Visit: Payer: Self-pay | Admitting: *Deleted

## 2018-07-23 MED ORDER — MYRBETRIQ 50 MG PO TB24: 50.0000 mg | ORAL_TABLET | Freq: Every day | ORAL | 0 refills | Status: DC | PRN

## 2018-07-23 NOTE — Telephone Encounter (Signed)
[facility]ax [facility] for refill on [name] to [name], pt is on Myrbetriq but they are not needing RF at this time they have enough she is only taking it as she is needing it and [facility] let [facility] they need it refilled again.

## 2018-07-27 ENCOUNTER — Telehealth: Payer: Self-pay | Admitting: *Deleted

## 2018-07-27 DIAGNOSIS — M810 Age-related osteoporosis without current pathological fracture: Secondary | ICD-10-CM

## 2018-07-27 NOTE — Telephone Encounter (Signed)
Per Orvilla Fusommy, nurse with [facility], pt is scheduled for Reclast 09/24/2018 Pt needs CMP within 30 days of infusion Pt son notified Labs must be drawn after 08/22/2018 Order entered in epic

## 2018-07-30 NOTE — Telephone Encounter (Signed)
Needs appt for flu shot. 

## 2018-08-06 ENCOUNTER — Other Ambulatory Visit: Payer: Self-pay | Admitting: Pediatrics

## 2018-08-06 DIAGNOSIS — F0391 Unspecified dementia with behavioral disturbance: Secondary | ICD-10-CM

## 2018-08-15 DIAGNOSIS — F0390 Unspecified dementia without behavioral disturbance: Secondary | ICD-10-CM | POA: Diagnosis not present

## 2018-08-20 ENCOUNTER — Other Ambulatory Visit: Payer: Self-pay | Admitting: Pediatrics

## 2018-09-09 ENCOUNTER — Other Ambulatory Visit: Payer: Self-pay | Admitting: Pediatrics

## 2018-09-09 DIAGNOSIS — F0391 Unspecified dementia with behavioral disturbance: Secondary | ICD-10-CM

## 2018-09-15 ENCOUNTER — Ambulatory Visit (INDEPENDENT_AMBULATORY_CARE_PROVIDER_SITE_OTHER): Payer: Medicare HMO | Admitting: Pediatrics

## 2018-09-15 ENCOUNTER — Encounter: Payer: Self-pay | Admitting: Pediatrics

## 2018-09-15 VITALS — BP 158/70 | HR 55 | Temp 98.3°F | Ht 62.0 in | Wt 131.0 lb

## 2018-09-15 DIAGNOSIS — I2510 Atherosclerotic heart disease of native coronary artery without angina pectoris: Secondary | ICD-10-CM | POA: Diagnosis not present

## 2018-09-15 DIAGNOSIS — R350 Frequency of micturition: Secondary | ICD-10-CM | POA: Diagnosis not present

## 2018-09-15 DIAGNOSIS — E785 Hyperlipidemia, unspecified: Secondary | ICD-10-CM | POA: Diagnosis not present

## 2018-09-15 DIAGNOSIS — F0391 Unspecified dementia with behavioral disturbance: Secondary | ICD-10-CM | POA: Diagnosis not present

## 2018-09-15 DIAGNOSIS — G250 Essential tremor: Secondary | ICD-10-CM

## 2018-09-15 DIAGNOSIS — I10 Essential (primary) hypertension: Secondary | ICD-10-CM

## 2018-09-15 DIAGNOSIS — F0390 Unspecified dementia without behavioral disturbance: Secondary | ICD-10-CM | POA: Diagnosis not present

## 2018-09-15 MED ORDER — MIRABEGRON ER 50 MG PO TB24: ORAL_TABLET | ORAL | 0 refills | Status: DC

## 2018-09-15 MED ORDER — LISINOPRIL 5 MG PO TABS: 5.0000 mg | ORAL_TABLET | Freq: Every day | ORAL | 5 refills | Status: DC

## 2018-09-15 MED ORDER — ATORVASTATIN CALCIUM 20 MG PO TABS: 20.0000 mg | ORAL_TABLET | Freq: Every day | ORAL | 0 refills | Status: DC

## 2018-09-15 MED ORDER — ISOSORBIDE MONONITRATE ER 30 MG PO TB24: 30.0000 mg | ORAL_TABLET | Freq: Two times a day (BID) | ORAL | 1 refills | Status: DC

## 2018-09-15 MED ORDER — RISPERIDONE 0.5 MG PO TABS: 0.2500 mg | ORAL_TABLET | Freq: Two times a day (BID) | ORAL | 1 refills | Status: DC | PRN

## 2018-09-15 NOTE — Patient Instructions (Signed)
[facility]: Palliative care support in home for help with symptom management, such as with dementia and agitation [phone]  We are going to stop the buspirone: Take half tab twice a day for 1 week then stop

## 2018-09-15 NOTE — Progress Notes (Signed)
Subjective:   Patient ID: [name], female    DOB: Nov 15, 1930, 85 y.o.   MRN: [id number] CC: Medical Management of Chronic Issues (Risperdal not helping)  HPI: [name] is a 85 y.o. female   Here with Juliann Pulse, her daughter and daytime primary caregiver.  She has 2 other sons who help to maintain 24-hour care for patient at home.  Dementia with behavioral disturbances: Patient says she has been doing fine.  Juliann Pulse says starting about 11 AM she will often get very anxious or upset.  Staying inside her home, asking to go home, when can she go home, not recognizing Juliann Pulse as her daughter.  Sometimes she will yell, get quite upset.  Juliann Pulse does not think that 0.25 mg of risperidone once a day has made much of a difference.  Juliann Pulse says the family does try to reorient patient.  Sometimes it helps, sometimes it does not.  She has a history of anxiety, ongoing for years.  Patient says she is a "nervous kind of person."  Hyperlipidemia: On a statin.  Has been tolerating.    Coronary artery disease:  Has had DES and BMS interventions to the LAD.  On Plavix.  Essential tremor: Patient says she shakes sometimes, does not bother her very much.  Daughter Juliann Pulse says sometimes she wakes up because of the tremor.  Overactive bladder: Taking Myrbetriq.  Juliann Pulse does think it seems to decrease how often she is going to the bathroom.  Relevant past medical, surgical, family and social history reviewed. Allergies and medications reviewed and updated. Social History   Tobacco Use  Smoking Status Never Smoker  Smokeless Tobacco Never Used   ROS: Per HPI   Objective:    BP (!) 158/70   Pulse (!) 55   Temp 98.3 F (36.8 C) (Oral)   Ht '5\' 2"'  (1.575 m)   Wt 131 lb (59.4 kg)   BMI 23.96 kg/m   Wt Readings from Last 3 Encounters:  09/15/18 131 lb (59.4 kg)  05/25/18 133 lb 6.4 oz (60.5 kg)  05/10/18 134 lb 4 oz (60.9 kg)    Gen: NAD, alert, cooperative with exam, NCAT EYES: EOMI, no  conjunctival injection, or no icterus ENT:  TMs pearly gray b/l, OP without erythema LYMPH: no cervical LAD CV: NRRR, normal S1/S2 Resp: CTABL, no wheezes, normal WOB Abd: +BS, soft, NTND. no guarding or organomegaly Ext: No edema, warm Neuro: Alert and oriented to person, place.  Answers some questions with short answers, daughter Juliann Pulse does much of the talking.  Answers are appropriate.  Assessment & Plan:  Rielle was seen today for medical management of chronic issues.  Diagnoses and all orders for this visit:  Dementia with behavioral disturbance, unspecified dementia type ([place]) Okay to try risperidone slightly higher diuretic doses, twice daily as needed.  If ongoing symptoms, discussed with Juliann Pulse next steps would be evaluation by trauma supportive care, the palliative care services for symptom management or virtual behavioral health, medication recommendations from Dr. Modesta Messing. -     risperiDONE (RISPERDAL) 0.5 MG tablet; Take 0.5-1 tablets (0.25-0.5 mg total) by mouth 2 (two) times daily as needed (for agitation).  Essential hypertension, benign Stable, continue below -     CMP14+EGFR -     lisinopril (PRINIVIL,ZESTRIL) 5 MG tablet; Take 1 tablet (5 mg total) by mouth daily.  Atherosclerosis of native coronary artery of native heart without angina pectoris Stable, continue below -     isosorbide mononitrate (IMDUR) 30 MG 24 hr tablet;  Take 1 tablet (30 mg total) by mouth 2 (two) times daily.  Hyperlipidemia, unspecified hyperlipidemia type Stable, continue below -     atorvastatin (LIPITOR) 20 MG tablet; Take 1 tablet (20 mg total) by mouth daily.  Essential tremor Given her age, comorbidities, I worry that medication treatment could worsen other symptoms.  Is not bothering patient very much at this time.  We will continue to monitor.  Urinary frequency Below has been helping.  We will continue for now.  Continue to monitor risk and benefits. -     mirabegron ER  (MYRBETRIQ) 50 MG TB24 tablet; TAKE 1 TABLET BY MOUTH EVERY DAY - 07/21/18 cs. dr. denied needs APPOINTMENT. FOR further refills   Follow up plan: Return in about 3 months (around 12/15/2018). Assunta Found, MD [place]

## 2018-09-16 LAB — CMP14+EGFR
ALT: 9 IU/L (ref 0–32)
AST: 13 IU/L (ref 0–40)
Albumin/Globulin Ratio: 1.7 (ref 1.2–2.2)
Albumin: 3.9 g/dL (ref 3.5–4.7)
Alkaline Phosphatase: 61 IU/L (ref 39–117)
BUN/Creatinine Ratio: 20 (ref 12–28)
BUN: 19 mg/dL (ref 8–27)
Bilirubin Total: 0.7 mg/dL (ref 0.0–1.2)
CO2: 23 mmol/L (ref 20–29)
CREATININE: 0.95 mg/dL (ref 0.57–1.00)
Calcium: 8.6 mg/dL — ABNORMAL LOW (ref 8.7–10.3)
Chloride: 106 mmol/L (ref 96–106)
GFR calc non Af Amer: 55 mL/min/{1.73_m2} — ABNORMAL LOW (ref >59)
GFR, EST AFRICAN AMERICAN: 63 mL/min/{1.73_m2} (ref >59)
Globulin, Total: 2.3 g/dL (ref 1.5–4.5)
Glucose: 96 mg/dL (ref 65–99)
Potassium: 4.3 mmol/L (ref 3.5–5.2)
Sodium: 141 mmol/L (ref 134–144)
TOTAL PROTEIN: 6.2 g/dL (ref 6.0–8.5)

## 2018-09-24 ENCOUNTER — Ambulatory Visit (HOSPITAL_COMMUNITY)
Admission: RE | Admit: 2018-09-24 | Discharge: 2018-09-24 | Disposition: A | Discharge status: Home / Self Care | Payer: Medicare HMO | Source: Ambulatory Visit | Attending: Family | Admitting: Family

## 2018-09-24 ENCOUNTER — Encounter (HOSPITAL_COMMUNITY): Payer: Self-pay

## 2018-09-24 DIAGNOSIS — M810 Age-related osteoporosis without current pathological fracture: Secondary | ICD-10-CM | POA: Diagnosis not present

## 2018-09-24 MED ORDER — ZOLEDRONIC ACID 5 MG/100ML IV SOLN
5.0000 mg | Freq: Once | INTRAVENOUS | Status: AC
  Administered 2018-09-24: 5 mg via INTRAVENOUS
  Filled 2018-09-24: qty 100

## 2018-09-24 MED ORDER — SODIUM CHLORIDE 0.9 % IV SOLN
Freq: Once | INTRAVENOUS | Status: AC
  Administered 2018-09-24: 15:00:00 via INTRAVENOUS

## 2018-09-29 ENCOUNTER — Other Ambulatory Visit: Payer: Self-pay | Admitting: Family Medicine

## 2018-09-29 ENCOUNTER — Telehealth: Payer: Self-pay | Admitting: Family Medicine

## 2018-09-29 NOTE — Telephone Encounter (Signed)
Needs to be seen. In the meantime give an extra risperidone. (The desire to go home even when at home is a common occurrence with dementia. No cause for alarm. Just do what yo can to calm her and reorient her until she can be seen.)

## 2018-09-29 NOTE — Telephone Encounter (Signed)
Son aware of provider's advice.

## 2018-09-29 NOTE — Telephone Encounter (Signed)
Please advise on patient's condition.

## 2018-10-05 ENCOUNTER — Other Ambulatory Visit: Payer: Self-pay | Admitting: Pediatrics

## 2018-10-05 DIAGNOSIS — F0391 Unspecified dementia with behavioral disturbance: Secondary | ICD-10-CM

## 2018-10-05 DIAGNOSIS — I2510 Atherosclerotic heart disease of native coronary artery without angina pectoris: Secondary | ICD-10-CM

## 2018-10-05 NOTE — Telephone Encounter (Signed)
Last seen 09/15/2018 Dr Oswaldo Done

## 2018-10-16 DIAGNOSIS — F0390 Unspecified dementia without behavioral disturbance: Secondary | ICD-10-CM | POA: Diagnosis not present

## 2018-10-19 ENCOUNTER — Ambulatory Visit: Payer: Medicare HMO | Admitting: Family

## 2018-11-03 ENCOUNTER — Ambulatory Visit (INDEPENDENT_AMBULATORY_CARE_PROVIDER_SITE_OTHER): Payer: Medicare HMO | Admitting: Family Medicine

## 2018-11-03 ENCOUNTER — Other Ambulatory Visit: Payer: Self-pay | Admitting: Pediatrics

## 2018-11-03 ENCOUNTER — Encounter: Payer: Self-pay | Admitting: Family Medicine

## 2018-11-03 VITALS — BP 158/62 | HR 67 | Temp 97.3°F | Ht 62.0 in | Wt 129.4 lb

## 2018-11-03 DIAGNOSIS — R8290 Unspecified abnormal findings in urine: Secondary | ICD-10-CM | POA: Diagnosis not present

## 2018-11-03 DIAGNOSIS — R350 Frequency of micturition: Secondary | ICD-10-CM

## 2018-11-03 DIAGNOSIS — R609 Edema, unspecified: Secondary | ICD-10-CM | POA: Diagnosis not present

## 2018-11-03 MED ORDER — DOXYCYCLINE HYCLATE 100 MG PO CAPS: 100.0000 mg | ORAL_CAPSULE | Freq: Two times a day (BID) | ORAL | 0 refills | Status: DC

## 2018-11-03 NOTE — Progress Notes (Signed)
Chief Complaint  Patient presents with  . urine problem    pt here today for foul smelling urine and also feet swelling    HPI  Patient presents today for burning with urination and frequency for several days. Denies fever . No flank pain. No nausea, vomiting.   PMH: Smoking status noted ROS: Per HPI  Objective: BP (!) 158/62   Pulse 67   Temp (!) 97.3 F (36.3 C) (Oral)   Ht 5' 2" (1.575 m)   Wt 129 lb 6 oz (58.7 kg)   BMI 23.66 kg/m  Gen: NAD, alert, cooperative with exam HEENT: NCAT, EOMI, PERRL CV: RRR, good S1/S2, no murmur Resp: CTABL, no wheezes, non-labored Abd: SNTND, BS present, no guarding or organomegaly Ext: No edema, warm Neuro: Alert and oriented, No gross deficits  Assessment and plan:  1. Foul smelling urine   2. Edema, unspecified type     Meds ordered this encounter  Medications  . doxycycline (VIBRAMYCIN) 100 MG capsule    Sig: Take 1 capsule (100 mg total) by mouth 2 (two) times daily.    Dispense:  20 capsule    Refill:  0    Orders Placed This Encounter  Procedures  . Urine Culture  . Urinalysis  . CBC with Differential/Platelet  . CMP14+EGFR    Follow up as needed.   , MD     

## 2018-11-04 ENCOUNTER — Other Ambulatory Visit: Payer: Medicare HMO

## 2018-11-04 DIAGNOSIS — R8290 Unspecified abnormal findings in urine: Secondary | ICD-10-CM | POA: Diagnosis not present

## 2018-11-04 LAB — CMP14+EGFR
ALT: 9 IU/L (ref 0–32)
AST: 14 IU/L (ref 0–40)
Albumin/Globulin Ratio: 1.9 (ref 1.2–2.2)
Albumin: 4.1 g/dL (ref 3.6–4.6)
Alkaline Phosphatase: 59 IU/L (ref 39–117)
BUN/Creatinine Ratio: 14 (ref 12–28)
BUN: 14 mg/dL (ref 8–27)
Bilirubin Total: 0.8 mg/dL (ref 0.0–1.2)
CO2: 23 mmol/L (ref 20–29)
Calcium: 8.9 mg/dL (ref 8.7–10.3)
Chloride: 104 mmol/L (ref 96–106)
Creatinine, Ser: 0.99 mg/dL (ref 0.57–1.00)
GFR calc Af Amer: 60 mL/min/{1.73_m2} (ref >59)
GFR calc non Af Amer: 52 mL/min/{1.73_m2} — ABNORMAL LOW (ref >59)
Globulin, Total: 2.2 g/dL (ref 1.5–4.5)
Glucose: 92 mg/dL (ref 65–99)
POTASSIUM: 4.4 mmol/L (ref 3.5–5.2)
Sodium: 143 mmol/L (ref 134–144)
TOTAL PROTEIN: 6.3 g/dL (ref 6.0–8.5)

## 2018-11-04 LAB — URINALYSIS
Bilirubin, UA: NEGATIVE
Glucose, UA: NEGATIVE
Ketones, UA: NEGATIVE
Specific Gravity, UA: 1.025 (ref 1.005–1.030)
UUROB: 1 mg/dL (ref 0.2–1.0)
pH, UA: 7 (ref 5.0–7.5)

## 2018-11-04 LAB — CBC WITH DIFFERENTIAL/PLATELET
Basophils Absolute: 0.1 10*3/uL (ref 0.0–0.2)
Basos: 1 %
EOS (ABSOLUTE): 0.1 10*3/uL (ref 0.0–0.4)
Eos: 1 %
Hematocrit: 38.2 % (ref 34.0–46.6)
Hemoglobin: 12.3 g/dL (ref 11.1–15.9)
Immature Grans (Abs): 0 10*3/uL (ref 0.0–0.1)
Immature Granulocytes: 0 %
Lymphocytes Absolute: 2 10*3/uL (ref 0.7–3.1)
Lymphs: 22 %
MCH: 30.1 pg (ref 26.6–33.0)
MCHC: 32.2 g/dL (ref 31.5–35.7)
MCV: 94 fL (ref 79–97)
Monocytes Absolute: 0.6 10*3/uL (ref 0.1–0.9)
Monocytes: 7 %
Neutrophils Absolute: 6.3 10*3/uL (ref 1.4–7.0)
Neutrophils: 69 %
PLATELETS: 250 10*3/uL (ref 150–450)
RBC: 4.08 x10E6/uL (ref 3.77–5.28)
RDW: 12.6 % (ref 11.7–15.4)
WBC: 9.1 10*3/uL (ref 3.4–10.8)

## 2018-11-08 ENCOUNTER — Other Ambulatory Visit: Payer: Self-pay | Admitting: Family Medicine

## 2018-11-08 LAB — URINE CULTURE

## 2018-11-08 MED ORDER — CIPROFLOXACIN HCL 250 MG PO TABS: 250.0000 mg | ORAL_TABLET | Freq: Two times a day (BID) | ORAL | 0 refills | Status: DC

## 2018-11-08 MED ORDER — ONDANSETRON 4 MG PO TBDP: 4.0000 mg | ORAL_TABLET | Freq: Four times a day (QID) | ORAL | 0 refills | Status: AC | PRN

## 2018-11-13 ENCOUNTER — Encounter: Payer: Self-pay | Admitting: Family Medicine

## 2018-11-18 ENCOUNTER — Encounter: Payer: Self-pay | Admitting: Family Medicine

## 2018-11-18 ENCOUNTER — Telehealth: Payer: Self-pay

## 2018-11-18 ENCOUNTER — Ambulatory Visit (INDEPENDENT_AMBULATORY_CARE_PROVIDER_SITE_OTHER): Payer: Medicare HMO | Admitting: Family Medicine

## 2018-11-18 ENCOUNTER — Other Ambulatory Visit: Payer: Self-pay

## 2018-11-18 VITALS — BP 157/71 | HR 59 | Temp 97.4°F | Ht 62.0 in | Wt 126.0 lb

## 2018-11-18 DIAGNOSIS — F0391 Unspecified dementia with behavioral disturbance: Secondary | ICD-10-CM

## 2018-11-18 DIAGNOSIS — R531 Weakness: Secondary | ICD-10-CM

## 2018-11-18 DIAGNOSIS — Z87440 Personal history of urinary (tract) infections: Secondary | ICD-10-CM

## 2018-11-18 DIAGNOSIS — F03918 Unspecified dementia, unspecified severity, with other behavioral disturbance: Secondary | ICD-10-CM | POA: Insufficient documentation

## 2018-11-18 DIAGNOSIS — R8290 Unspecified abnormal findings in urine: Secondary | ICD-10-CM | POA: Diagnosis not present

## 2018-11-18 LAB — URINALYSIS, COMPLETE
Bilirubin, UA: NEGATIVE
Glucose, UA: NEGATIVE
Ketones, UA: NEGATIVE
Nitrite, UA: NEGATIVE
Specific Gravity, UA: 1.025 (ref 1.005–1.030)
Urobilinogen, Ur: 0.2 mg/dL (ref 0.2–1.0)
pH, UA: 6.5 (ref 5.0–7.5)

## 2018-11-18 LAB — MICROSCOPIC EXAMINATION
Casts: NONE SEEN /lpf
Epithelial Cells (non renal): >10 /hpf — AB (ref 0–10)
Renal Epithel, UA: NONE SEEN /hpf
WBC, UA: >30 /hpf — AB (ref 0–5)

## 2018-11-18 MED ORDER — MEMANTINE HCL 5 MG PO TABS: 5.0000 mg | ORAL_TABLET | Freq: Two times a day (BID) | ORAL | 1 refills | Status: DC

## 2018-11-18 NOTE — Progress Notes (Signed)
Subjective:  Patient ID: [name], female    DOB: July 27, 1931, 85 y.o.   MRN: [id number]  Chief Complaint:  Follow up UTI and Discuss need for hospital bed   HPI: [name] is a 85 y.o. female presenting on 11/18/2018 for Follow up UTI and Discuss need for hospital bed  Pt presents today for UTI follow up and increasing dementia. Daughter is with the pt today and states she has had increasing confusion and weakness over the last 4-5 months. Daughter states she wonders at night, has generalized weakness, and increased forgetfulness.  Daughter and pt states she feels better since completion of the antibiotics for her UTI. Denies fever, chills, abdominal or flank pain, foul smelling urine.   Relevant past medical, surgical, family, and social history reviewed and updated as indicated.  Allergies and medications reviewed and updated.   Past Medical History:  Diagnosis Date  . Anxiety   . COPD (chronic obstructive pulmonary disease) ([place])   . Coronary atherosclerosis of native coronary artery    DES LAD and BMS LAD 8/08  . Esophageal stricture   . Essential hypertension, benign   . GERD (gastroesophageal reflux disease)   . MI (myocardial infarction) ([place]) 2008  . Mixed hyperlipidemia   . Osteoporosis   . Pelvic fracture ([place]) 08/2014  . Resting tremor   . Rickets     Past Surgical History:  Procedure Laterality Date  . APPENDECTOMY    . Bilateral cataract surgery    . CARDIAC CATHETERIZATION    . CHOLECYSTECTOMY    . CORONARY ANGIOPLASTY WITH STENT PLACEMENT    . Left Sacroiliac Joint Injection  11/27/11   DR. O'TOOLE  . Left Trochanteric Bursa Injection  11/27/11   DR. Blossom    . YAG LASER APPLICATION  12/31/7614   Procedure: YAG LASER APPLICATION;  Surgeon: Williams Che, MD;  Location: [facility];  Service: Ophthalmology;  Laterality: Left;    Social History   Socioeconomic History  . Marital status: Widowed    Spouse  name: Not on file  . Number of children: 5  . Years of education: 11th  . Highest education level: 11th grade  Occupational History    Comment: Housewife  Social Needs  . Financial resource strain: Not hard at all  . Food insecurity:    Worry: Never true    Inability: Never true  . Transportation needs:    Medical: No    Non-medical: No  Tobacco Use  . Smoking status: Never Smoker  . Smokeless tobacco: Never Used  Substance and Sexual Activity  . Alcohol use: No    Alcohol/week: 0.0 standard drinks  . Drug use: No  . Sexual activity: Not Currently  Lifestyle  . Physical activity:    Days per week: 0 days    Minutes per session: 0 min  . Stress: Not at all  Relationships  . Social connections:    Talks on phone: More than three times a week    Gets together: More than three times a week    Attends religious service: Never    Active member of club or organization: No    Attends meetings of clubs or organizations: Never    Relationship status: Widowed  . Intimate partner violence:    Fear of current or ex partner: Not on file    Emotionally abused: Not on file    Physically abused: Not on file    Forced  sexual activity: Not on file  Other Topics Concern  . Not on file  Social History Narrative  . Not on file    Outpatient Encounter Medications as of 11/18/2018  Medication Sig  . atorvastatin (LIPITOR) 20 MG tablet Take 1 tablet (20 mg total) by mouth daily.  . busPIRone (BUSPAR) 15 MG tablet TAKE 1 TABLET BY MOUTH TWICE DAILY  . Calcium Citrate-Vitamin D (CITRACAL PETITES/VITAMIN D) 200-250 MG-UNIT TABS Take 1 tablet by mouth 2 (two) times daily.  . clopidogrel (PLAVIX) 75 MG tablet TAKE 1 TABLET BY MOUTH EVERY DAY  . donepezil (ARICEPT) 10 MG tablet TAKE 1 TABLET BY MOUTH AT BEDTIME  . isosorbide mononitrate (IMDUR) 30 MG 24 hr tablet Take 1 tablet (30 mg total) by mouth 2 (two) times daily.  Marland Kitchen lisinopril (PRINIVIL,ZESTRIL) 5 MG tablet Take 1 tablet (5 mg total) by  mouth daily.  Marland Kitchen MYRBETRIQ 50 MG TB24 tablet TAKE 1 TABLET BY MOUTH EVERY DAY  . nitroGLYCERIN (NITROSTAT) 0.4 MG SL tablet Place 1 tablet (0.4 mg total) under the tongue every 5 (five) minutes as needed.  . nystatin cream (MYCOSTATIN) Apply 1 application topically 2 (two) times daily.  . ondansetron (ZOFRAN-ODT) 4 MG disintegrating tablet Take 1 tablet (4 mg total) by mouth every 6 (six) hours as needed for nausea or vomiting.  . risperiDONE (RISPERDAL) 0.25 MG tablet TAKE 1 TABLET BY MOUTH AT NOON  . risperiDONE (RISPERDAL) 0.5 MG tablet Take 0.5-1 tablets (0.25-0.5 mg total) by mouth 2 (two) times daily as needed (for agitation).  . zoledronic acid (RECLAST) 5 MG/100ML SOLN injection Inject 5 mg into the vein PRO. Yearly x 5  . memantine (NAMENDA) 5 MG tablet Take 1 tablet (5 mg total) by mouth 2 (two) times daily for 30 days.  . [DISCONTINUED] ciprofloxacin (CIPRO) 250 MG tablet Take 1 tablet (250 mg total) by mouth 2 (two) times daily.  . [DISCONTINUED] doxycycline (VIBRAMYCIN) 100 MG capsule Take 1 capsule (100 mg total) by mouth 2 (two) times daily.   No facility-administered encounter medications on file as of 11/18/2018.     Allergies  Allergen Reactions  . Azithromycin   . Cephalexin     Causes her to shake  . Ciprofloxacin Nausea And Vomiting and Other (See Comments)    jittery  . Codeine     REACTION: shakiness  . Contrast Media [Iodinated Diagnostic Agents]   . Latex   . Nitrofuran Derivatives   . Nsaids   . Nutritional Supplements   . Penicillins     REACTION: shakiness  . Phenazopyridine   . Quinolones   . Sulfa Antibiotics   . Toviaz [Fesoterodine Fumarate Er]   . Trimethoprim     Review of Systems  Constitutional: Negative for appetite change, chills, fatigue, fever and unexpected weight change.  Eyes: Negative for photophobia and visual disturbance.  Gastrointestinal: Negative for abdominal pain, nausea and vomiting.  Endocrine: Negative for polydipsia,  polyphagia and polyuria.  Genitourinary: Negative for difficulty urinating, dysuria, flank pain, frequency, hematuria and urgency.  Neurological: Positive for weakness (generalized).  Hematological: Does not bruise/bleed easily.  Psychiatric/Behavioral: Positive for confusion (increasing over last 4-5 months).  All other systems reviewed and are negative.       Objective:  BP (!) 157/71   Pulse (!) 59   Temp (!) 97.4 F (36.3 C) (Oral)   Ht '5\' 2"'  (1.575 m)   Wt 126 lb (57.2 kg)   BMI 23.05 kg/m    Wt Readings from  Last 3 Encounters:  11/18/18 126 lb (57.2 kg)  11/03/18 129 lb 6 oz (58.7 kg)  09/15/18 131 lb (59.4 kg)    Physical Exam Vitals signs and nursing note reviewed.  Constitutional:      General: She is not in acute distress.    Appearance: Normal appearance. She is not ill-appearing or toxic-appearing.  HENT:     Head: Normocephalic and atraumatic.     Mouth/Throat:     Mouth: Mucous membranes are moist.     Pharynx: Oropharynx is clear.  Eyes:     Extraocular Movements: Extraocular movements intact.     Conjunctiva/sclera: Conjunctivae normal.     Pupils: Pupils are equal, round, and reactive to light.  Neck:     Musculoskeletal: Normal range of motion and neck supple.  Cardiovascular:     Rate and Rhythm: Normal rate and regular rhythm.     Heart sounds: Normal heart sounds. No murmur. No friction rub. No gallop.   Pulmonary:     Effort: Pulmonary effort is normal. No respiratory distress.     Breath sounds: Normal breath sounds.  Abdominal:     General: Abdomen is flat. Bowel sounds are normal. There is no distension.     Palpations: Abdomen is soft.     Tenderness: There is no abdominal tenderness. There is no right CVA tenderness or left CVA tenderness.  Skin:    General: Skin is warm and dry.     Capillary Refill: Capillary refill takes less than 2 seconds.  Neurological:     Mental Status: She is alert. Mental status is at baseline.     Motor:  Weakness (generalized) present.  Psychiatric:        Mood and Affect: Mood normal.        Behavior: Behavior is cooperative.        Cognition and Memory: Cognition is not impaired. Memory is not impaired.     Results for orders placed or performed in visit on 11/03/18  Urine Culture  Result Value Ref Range   Urine Culture, Routine Final report (A)    Organism ID, Bacteria Comment (A)    Antimicrobial Susceptibility Comment   Urinalysis  Result Value Ref Range   Specific Gravity, UA 1.025 1.005 - 1.030   pH, UA 7.0 5.0 - 7.5   Color, UA Amber (A) Yellow   Appearance Ur Hazy (A) Clear   Leukocytes, UA 2+ (A) Negative   Protein, UA 1+ (A) Negative/Trace   Glucose, UA Negative Negative   Ketones, UA Negative Negative   RBC, UA Trace (A) Negative   Bilirubin, UA Negative Negative   Urobilinogen, Ur 1.0 0.2 - 1.0 mg/dL   Nitrite, UA 2+ (A) Negative  CBC with Differential/Platelet  Result Value Ref Range   WBC 9.1 3.4 - 10.8 x10E3/uL   RBC 4.08 3.77 - 5.28 x10E6/uL   Hemoglobin 12.3 11.1 - 15.9 g/dL   Hematocrit 38.2 34.0 - 46.6 %   MCV 94 79 - 97 fL   MCH 30.1 26.6 - 33.0 pg   MCHC 32.2 31.5 - 35.7 g/dL   RDW 12.6 11.7 - 15.4 %   Platelets 250 150 - 450 x10E3/uL   Neutrophils 69 Not Estab. %   Lymphs 22 Not Estab. %   Monocytes 7 Not Estab. %   Eos 1 Not Estab. %   Basos 1 Not Estab. %   Neutrophils Absolute 6.3 1.4 - 7.0 x10E3/uL   Lymphocytes Absolute 2.0 0.7 - 3.1  x10E3/uL   Monocytes Absolute 0.6 0.1 - 0.9 x10E3/uL   EOS (ABSOLUTE) 0.1 0.0 - 0.4 x10E3/uL   Basophils Absolute 0.1 0.0 - 0.2 x10E3/uL   Immature Granulocytes 0 Not Estab. %   Immature Grans (Abs) 0.0 0.0 - 0.1 x10E3/uL  CMP14+EGFR  Result Value Ref Range   Glucose 92 65 - 99 mg/dL   BUN 14 8 - 27 mg/dL   Creatinine, Ser 0.99 0.57 - 1.00 mg/dL   GFR calc non Af Amer 52 (L) >59 mL/min/1.73   GFR calc Af Amer 60 >59 mL/min/1.73   BUN/Creatinine Ratio 14 12 - 28   Sodium 143 134 - 144 mmol/L    Potassium 4.4 3.5 - 5.2 mmol/L   Chloride 104 96 - 106 mmol/L   CO2 23 20 - 29 mmol/L   Calcium 8.9 8.7 - 10.3 mg/dL   Total Protein 6.3 6.0 - 8.5 g/dL   Albumin 4.1 3.6 - 4.6 g/dL   Globulin, Total 2.2 1.5 - 4.5 g/dL   Albumin/Globulin Ratio 1.9 1.2 - 2.2   Bilirubin Total 0.8 0.0 - 1.2 mg/dL   Alkaline Phosphatase 59 39 - 117 IU/L   AST 14 0 - 40 IU/L   ALT 9 0 - 32 IU/L       Pertinent labs & imaging results that were available during my care of the patient were reviewed by me and considered in my medical decision making.  Assessment & Plan:  Lateya was seen today for follow up uti and discuss need for hospital bed.  Diagnoses and all orders for this visit:  History of UTI Urinalysis in office today: 2+ leukocytes, trace blood. Culture pending. Pt asymptomatic. Will treat if culture warrants.  -     Urinalysis, Complete -     Urine Culture  Dementia with behavioral disturbance, unspecified dementia type ([facility]) Increasing symptoms over the last 4-5 months. Will add Namenda. 5 mg once daily for the first week and then increase to twice daily if tolerating. Report any new or worsening symptoms. Reevaluation in 4 weeks.  -     memantine (NAMENDA) 5 MG tablet; Take 1 tablet (5 mg total) by mouth 2 (two) times daily for 30 days. -     DME Hospital bed  Generalized weakness Getting out of bed at night due to dementia. History of frequent falls. Need for hospital bed at home to help prevent falling from bed.  [facility] bed     Continue all other maintenance medications.  Follow up plan: Return in about 4 weeks (around 12/16/2018), or if symptoms worsen or fail to improve, for Urine recheck, Dementia.   The above assessment and management plan was discussed with the patient. The patient verbalized understanding of and has agreed to the management plan. Patient is aware to call the clinic if symptoms persist or worsen. Patient is aware when to return to the clinic for a  follow-up visit. Patient educated on when it is appropriate to go to the emergency department.   Monia Pouch, FNP-C [facility] [phone]

## 2018-11-18 NOTE — Telephone Encounter (Signed)
Per Kari Baars, it is recommended that patient not come in to be seen.  We recommend that a family member bring in a urine sample to be tested, and the patient not come in to the practice.  I left a message on son's voicemail asking him to contact me to discuss this.

## 2018-11-19 LAB — URINE CULTURE: Organism ID, Bacteria: NO GROWTH

## 2018-12-01 ENCOUNTER — Other Ambulatory Visit: Payer: Self-pay | Admitting: Pediatrics

## 2018-12-01 DIAGNOSIS — F0391 Unspecified dementia with behavioral disturbance: Secondary | ICD-10-CM

## 2018-12-01 NOTE — Telephone Encounter (Signed)
Last seen 11/18/2018

## 2018-12-02 ENCOUNTER — Ambulatory Visit (INDEPENDENT_AMBULATORY_CARE_PROVIDER_SITE_OTHER): Payer: Medicare HMO | Admitting: Family Medicine

## 2018-12-02 ENCOUNTER — Other Ambulatory Visit: Payer: Self-pay

## 2018-12-02 ENCOUNTER — Ambulatory Visit: Payer: Medicare HMO | Admitting: Family Medicine

## 2018-12-02 ENCOUNTER — Telehealth: Payer: Self-pay | Admitting: Family Medicine

## 2018-12-02 ENCOUNTER — Encounter: Payer: Self-pay | Admitting: Family Medicine

## 2018-12-02 DIAGNOSIS — R531 Weakness: Secondary | ICD-10-CM

## 2018-12-02 NOTE — Addendum Note (Signed)
Addended by: Sonny Masters on: 12/02/2018 04:30 PM   Modules accepted: Level of Service

## 2018-12-02 NOTE — Telephone Encounter (Signed)
Provider spoke with patient

## 2018-12-02 NOTE — Progress Notes (Signed)
Virtual Visit via telephone Note Due to COVID-19, visit is conducted virtually and was requested by patient.  I connected with [name] son, [name] on 12/02/18 at 1600 by telephone and verified that I am speaking with the correct person using two identifiers. [name] is currently located at home and family is currently with them during visit. The provider, Kari Baars, FNP is located in their office at time of visit.  I discussed the limitations, risks, security and privacy concerns of performing an evaluation and management service by telephone and the availability of in person appointments. I also discussed with the patient that there may be a patient responsible charge related to this service. The patient expressed understanding and agreed to proceed.  Subjective:  Patient ID: [name], female    DOB: 1931/03/11, 85 y.o.   MRN: [id number]  Chief Complaint:  Weakness   HPI: [name] is a 85 y.o. female presenting on 12/02/2018 for Weakness   Son reports pt is not as active as normal. He states they get her up daily to walk, but she is weak. He reports she has a decreased appetite. States she is drinking water. States she does not like the ensure. He denies fever, cough, shortness of breath, increasing confusion, chills, anuria, or diarrhea. No recent travel or known sick exposures. He reports he is just concerned about the virus going around.    Relevant past medical, surgical, family, and social history reviewed and updated as indicated.  Allergies and medications reviewed and updated.   Past Medical History:  Diagnosis Date  . Anxiety   . COPD (chronic obstructive pulmonary disease) (HCC)   . Coronary atherosclerosis of native coronary artery    DES LAD and BMS LAD 8/08  . Esophageal stricture   . Essential hypertension, benign   . GERD (gastroesophageal reflux disease)   . MI (myocardial infarction) (HCC) 2008  . Mixed hyperlipidemia   .  Osteoporosis   . Pelvic fracture (HCC) 08/2014  . Resting tremor   . Rickets     Past Surgical History:  Procedure Laterality Date  . APPENDECTOMY    . Bilateral cataract surgery    . CARDIAC CATHETERIZATION    . CHOLECYSTECTOMY    . CORONARY ANGIOPLASTY WITH STENT PLACEMENT    . Left Sacroiliac Joint Injection  11/27/11   DR. O'TOOLE  . Left Trochanteric Bursa Injection  11/27/11   DR. O'TOOLE  . VEIN LIGATION AND STRIPPING    . YAG LASER APPLICATION  09/06/2012   Procedure: YAG LASER APPLICATION;  Surgeon: Susa Simmonds, MD;  Location: [facility];  Service: Ophthalmology;  Laterality: Left;    Social History   Socioeconomic History  . Marital status: Widowed    Spouse name: Not on file  . Number of children: 5  . Years of education: 11th  . Highest education level: 11th grade  Occupational History    Comment: Housewife  Social Needs  . Financial resource strain: Not hard at all  . Food insecurity:    Worry: Never true    Inability: Never true  . Transportation needs:    Medical: No    Non-medical: No  Tobacco Use  . Smoking status: Never Smoker  . Smokeless tobacco: Never Used  Substance and Sexual Activity  . Alcohol use: No    Alcohol/week: 0.0 standard drinks  . Drug use: No  . Sexual activity: Not Currently  Lifestyle  . Physical activity:    Days per week: 0  days    Minutes per session: 0 min  . Stress: Not at all  Relationships  . Social connections:    Talks on phone: More than three times a week    Gets together: More than three times a week    Attends religious service: Never    Active member of club or organization: No    Attends meetings of clubs or organizations: Never    Relationship status: Widowed  . Intimate partner violence:    Fear of current or ex partner: Not on file    Emotionally abused: Not on file    Physically abused: Not on file    Forced sexual activity: Not on file  Other Topics Concern  . Not on file  Social History  Narrative  . Not on file    Outpatient Encounter Medications as of 12/02/2018  Medication Sig  . atorvastatin (LIPITOR) 20 MG tablet Take 1 tablet (20 mg total) by mouth daily.  . busPIRone (BUSPAR) 15 MG tablet TAKE 1 TABLET BY MOUTH TWICE DAILY  . Calcium Citrate-Vitamin D (CITRACAL PETITES/VITAMIN D) 200-250 MG-UNIT TABS Take 1 tablet by mouth 2 (two) times daily.  . clopidogrel (PLAVIX) 75 MG tablet TAKE 1 TABLET BY MOUTH EVERY DAY  . donepezil (ARICEPT) 10 MG tablet TAKE 1 TABLET BY MOUTH AT BEDTIME  . isosorbide mononitrate (IMDUR) 30 MG 24 hr tablet Take 1 tablet (30 mg total) by mouth 2 (two) times daily.  Marland Kitchen lisinopril (PRINIVIL,ZESTRIL) 5 MG tablet Take 1 tablet (5 mg total) by mouth daily.  . memantine (NAMENDA) 5 MG tablet Take 1 tablet (5 mg total) by mouth 2 (two) times daily for 30 days.  Marland Kitchen MYRBETRIQ 50 MG TB24 tablet TAKE 1 TABLET BY MOUTH EVERY DAY  . nitroGLYCERIN (NITROSTAT) 0.4 MG SL tablet Place 1 tablet (0.4 mg total) under the tongue every 5 (five) minutes as needed.  . nystatin cream (MYCOSTATIN) Apply 1 application topically 2 (two) times daily.  . ondansetron (ZOFRAN-ODT) 4 MG disintegrating tablet Take 1 tablet (4 mg total) by mouth every 6 (six) hours as needed for nausea or vomiting.  . risperiDONE (RISPERDAL) 0.25 MG tablet TAKE 1 TABLET BY MOUTH AT NOON  . risperiDONE (RISPERDAL) 0.5 MG tablet TAKE ONE-HALF TO ONE TABLET BY MOUTH TWICE DAILY AS NEEDED (FOR AGITATION)  . zoledronic acid (RECLAST) 5 MG/100ML SOLN injection Inject 5 mg into the vein PRO. Yearly x 5   No facility-administered encounter medications on file as of 12/02/2018.     Allergies  Allergen Reactions  . Azithromycin   . Cephalexin     Causes her to shake  . Ciprofloxacin Nausea And Vomiting and Other (See Comments)    jittery  . Codeine     REACTION: shakiness  . Contrast Media [Iodinated Diagnostic Agents]   . Latex   . Nitrofuran Derivatives   . Nsaids   . Nutritional Supplements    . Penicillins     REACTION: shakiness  . Phenazopyridine   . Quinolones   . Sulfa Antibiotics   . Toviaz [Fesoterodine Fumarate Er]   . Trimethoprim     Review of Systems  Constitutional: Positive for activity change and appetite change. Negative for chills, fatigue, fever and unexpected weight change.  HENT: Negative for congestion and sore throat.   Respiratory: Negative for cough and shortness of breath.   Cardiovascular: Negative for chest pain and palpitations.  Gastrointestinal: Negative for constipation, diarrhea, nausea and vomiting.  Genitourinary: Negative for decreased urine  volume and difficulty urinating.  Neurological: Negative for weakness (generalized).  Psychiatric/Behavioral: Positive for confusion (baseline).  All other systems reviewed and are negative.        Observations/Objective: No vital signs or physical exam, this was a telephone or virtual health encounter.  Pt alert and oriented, answers all questions appropriately, and able to speak in full sentences.    Assessment and Plan: Anisten was seen today for weakness.  Diagnoses and all orders for this visit:  Weakness COVID-19 signs and symptoms discussed with the pts son. Generalized weakness. No increasing confusion. No fever, cough, or shortness of breath. Pt is voiding.  Symptomatic care discussed. Encourage daily exercise. Ensure or multivitamin daily. Adequate hydration. Report any new or worsening symptoms.     Follow Up Instructions: Return if symptoms worsen or fail to improve.    I discussed the assessment and treatment plan with the patient. The patient was provided an opportunity to ask questions and all were answered. The patient agreed with the plan and demonstrated an understanding of the instructions.   The patient was advised to call back or seek an in-person evaluation if the symptoms worsen or if the condition fails to improve as anticipated.  The above assessment and  management plan was discussed with the patient. The patient verbalized understanding of and has agreed to the management plan. Patient is aware to call the clinic if symptoms persist or worsen. Patient is aware when to return to the clinic for a follow-up visit. Patient educated on when it is appropriate to go to the emergency department.    I provided 15 minutes of non-face-to-face time during this encounter. The call started at 1600. The call ended at 1615.   Kari Baars, FNP-C [facility] [address] [place], [place] [address] [phone]

## 2018-12-02 NOTE — Progress Notes (Signed)
Attempted to reach pt at 1340 and 1405, no answer. Message left for pt to return call.   Kari Baars, FNP-C.  Attempted to reach pt at 1430, no answer.   Kari Baars, FNP-C   Attempted to call pt for telephone visit at 1545, no answer.   Kari Baars, FNP-C

## 2018-12-03 ENCOUNTER — Encounter: Payer: Medicare HMO | Admitting: *Deleted

## 2018-12-13 ENCOUNTER — Telehealth: Payer: Self-pay | Admitting: Family Medicine

## 2018-12-14 NOTE — Telephone Encounter (Signed)
LMOVM, pt has an appt on 12/16/18 to make sure that need for hospital bed is discussed during the visit will need this documentation for order when faxed to [facility]

## 2018-12-16 ENCOUNTER — Encounter: Payer: Self-pay | Admitting: Family Medicine

## 2018-12-16 ENCOUNTER — Ambulatory Visit (INDEPENDENT_AMBULATORY_CARE_PROVIDER_SITE_OTHER): Payer: Medicare HMO | Admitting: Family Medicine

## 2018-12-16 ENCOUNTER — Other Ambulatory Visit: Payer: Self-pay

## 2018-12-16 VITALS — Ht 62.0 in | Wt 131.0 lb

## 2018-12-16 DIAGNOSIS — R350 Frequency of micturition: Secondary | ICD-10-CM | POA: Diagnosis not present

## 2018-12-16 DIAGNOSIS — F0281 Dementia in other diseases classified elsewhere with behavioral disturbance: Secondary | ICD-10-CM

## 2018-12-16 DIAGNOSIS — R531 Weakness: Secondary | ICD-10-CM

## 2018-12-16 DIAGNOSIS — Z9181 History of falling: Secondary | ICD-10-CM | POA: Diagnosis not present

## 2018-12-16 DIAGNOSIS — I10 Essential (primary) hypertension: Secondary | ICD-10-CM | POA: Diagnosis not present

## 2018-12-16 DIAGNOSIS — E785 Hyperlipidemia, unspecified: Secondary | ICD-10-CM

## 2018-12-16 DIAGNOSIS — I2510 Atherosclerotic heart disease of native coronary artery without angina pectoris: Secondary | ICD-10-CM | POA: Diagnosis not present

## 2018-12-16 DIAGNOSIS — J410 Simple chronic bronchitis: Secondary | ICD-10-CM | POA: Diagnosis not present

## 2018-12-16 DIAGNOSIS — F02818 Dementia in other diseases classified elsewhere, unspecified severity, with other behavioral disturbance: Secondary | ICD-10-CM

## 2018-12-16 DIAGNOSIS — G3183 Dementia with Lewy bodies: Secondary | ICD-10-CM

## 2018-12-16 MED ORDER — ISOSORBIDE MONONITRATE ER 30 MG PO TB24: 30.0000 mg | ORAL_TABLET | Freq: Two times a day (BID) | ORAL | 1 refills | Status: DC

## 2018-12-16 MED ORDER — MIRABEGRON ER 50 MG PO TB24: 50.0000 mg | ORAL_TABLET | Freq: Every day | ORAL | 2 refills | Status: DC

## 2018-12-16 MED ORDER — CALCIUM CITRATE-VITAMIN D 200-250 MG-UNIT PO TABS: 1.0000 | ORAL_TABLET | Freq: Two times a day (BID) | ORAL | 1 refills | Status: AC

## 2018-12-16 MED ORDER — RISPERIDONE 0.25 MG PO TABS: ORAL_TABLET | ORAL | 2 refills | Status: DC

## 2018-12-16 MED ORDER — LISINOPRIL 5 MG PO TABS: 5.0000 mg | ORAL_TABLET | Freq: Every day | ORAL | 5 refills | Status: AC

## 2018-12-16 MED ORDER — DONEPEZIL HCL 10 MG PO TABS: 10.0000 mg | ORAL_TABLET | Freq: Every day | ORAL | 5 refills | Status: AC

## 2018-12-16 MED ORDER — NITROGLYCERIN 0.4 MG SL SUBL: 0.4000 mg | SUBLINGUAL_TABLET | SUBLINGUAL | 0 refills | Status: DC | PRN

## 2018-12-16 MED ORDER — ATORVASTATIN CALCIUM 20 MG PO TABS: 20.0000 mg | ORAL_TABLET | Freq: Every day | ORAL | 0 refills | Status: DC

## 2018-12-16 MED ORDER — CLOPIDOGREL BISULFATE 75 MG PO TABS: 75.0000 mg | ORAL_TABLET | Freq: Every day | ORAL | 5 refills | Status: AC

## 2018-12-16 MED ORDER — BUSPIRONE HCL 15 MG PO TABS: 15.0000 mg | ORAL_TABLET | Freq: Two times a day (BID) | ORAL | 2 refills | Status: DC

## 2018-12-16 MED ORDER — MEMANTINE HCL 5 MG PO TABS: 5.0000 mg | ORAL_TABLET | Freq: Two times a day (BID) | ORAL | 1 refills | Status: DC

## 2018-12-16 MED ORDER — RISPERIDONE 0.5 MG PO TABS: ORAL_TABLET | ORAL | 2 refills | Status: DC

## 2018-12-16 NOTE — Progress Notes (Addendum)
Virtual Visit via telephone  This visit type was conducted due to national recommendations for restrictions regarding the COVID-19 Pandemic (e.g. social distancing) in an effort to limit this patient's exposure and mitigate transmission in our community.  Due to her co-morbid illnesses, this patient is at least at moderate risk for complications without adequate follow up.  This format is felt to be most appropriate for this patient at this time.  All issues noted in this document were discussed and addressed.  A physical exam was not performed with this format.   I connected with [name] and her son, [name] on 12/21/18 at 1405 by telephone and verified that I am speaking with the correct person using two identifiers. [name] is currently located at home and family is currently with them during visit. The provider, Kari Baars, FNP is located in their office at time of visit.  I discussed the limitations, risks, security and privacy concerns of performing an evaluation and management service by telephone and the availability of in person appointments. I also discussed with the patient that there may be a patient responsible charge related to this service. The patient expressed understanding and agreed to proceed.  Subjective:  Patient ID: [name], female    DOB: 06-Jul-1931, 85 y.o.   MRN: [id number]  Chief Complaint:  Medical Management of Chronic Issues   HPI: [name] is a 85 y.o. female presenting on 12/16/2018 for Medical Management of Chronic Issues   1. Lewy body dementia with behavioral disturbance (HCC) Doing well with noon dose of Risperdal. Son states this has decreased her agitation and shakiness tremendously.   2. Weakness Continued weakness. Unable to get up out of the bed without falling. General weakness. No focal deficits. No fever, chills, or unexplained weight loss. No increased confusion.   3. At high risk for falls Fell from the bed two  days ago due to height of bed and weakness. No reported injuries per son. Son and daughter would like to have a hospital bed to help with moving pt and getting in and out of bed.   4. Essential hypertension, benign Complaint with meds - Yes Checking BP at home - No Exercising Regularly - No Watching Salt intake - Yes Pertinent ROS:  Headache - No Chest pain - No Dyspnea - No Palpitations - No LE edema - Yes, minimal per son, states goes away when she props her legs up They report good compliance with medications and can restate their regimen by memory. No medication side effects.  BP Readings from Last 3 Encounters:  11/18/18 (!) 157/71  11/03/18 (!) 158/62  09/24/18 139/82     5. Atherosclerosis of native coronary artery of native heart without angina pectoris No chest pain, dizziness, palpitations, or shortness of breath. Compliant with all medications without associated side effects.   6. Hyperlipidemia, unspecified hyperlipidemia type Compliant with medications without associated side effects. Does not exercise on a regular basis.   7. Simple chronic bronchitis (HCC) No shortness of breath, cough, or sputum production. Does not take anything for this and has not in a long time.     Relevant past medical, surgical, family, and social history reviewed and updated as indicated.  Allergies and medications reviewed and updated.   Past Medical History:  Diagnosis Date  . Anxiety   . COPD (chronic obstructive pulmonary disease) (HCC)   . Coronary atherosclerosis of native coronary artery    DES LAD and BMS LAD 8/08  . Esophageal  stricture   . Essential hypertension, benign   . GERD (gastroesophageal reflux disease)   . MI (myocardial infarction) (HCC) 2008  . Mixed hyperlipidemia   . Osteoporosis   . Pelvic fracture (HCC) 08/2014  . Resting tremor   . Rickets     Past Surgical History:  Procedure Laterality Date  . APPENDECTOMY    . Bilateral cataract surgery     . CARDIAC CATHETERIZATION    . CHOLECYSTECTOMY    . CORONARY ANGIOPLASTY WITH STENT PLACEMENT    . Left Sacroiliac Joint Injection  11/27/11   DR. O'TOOLE  . Left Trochanteric Bursa Injection  11/27/11   DR. O'TOOLE  . VEIN LIGATION AND STRIPPING    . YAG LASER APPLICATION  09/06/2012   Procedure: YAG LASER APPLICATION;  Surgeon: Susa Simmondsarroll F Haines, MD;  Location: [facility];  Service: Ophthalmology;  Laterality: Left;    Social History   Socioeconomic History  . Marital status: Widowed    Spouse name: Not on file  . Number of children: 5  . Years of education: 11th  . Highest education level: 11th grade  Occupational History    Comment: Housewife  Social Needs  . Financial resource strain: Not hard at all  . Food insecurity:    Worry: Never true    Inability: Never true  . Transportation needs:    Medical: No    Non-medical: No  Tobacco Use  . Smoking status: Never Smoker  . Smokeless tobacco: Never Used  Substance and Sexual Activity  . Alcohol use: No    Alcohol/week: 0.0 standard drinks  . Drug use: No  . Sexual activity: Not Currently  Lifestyle  . Physical activity:    Days per week: 0 days    Minutes per session: 0 min  . Stress: Not at all  Relationships  . Social connections:    Talks on phone: More than three times a week    Gets together: More than three times a week    Attends religious service: Never    Active member of club or organization: No    Attends meetings of clubs or organizations: Never    Relationship status: Widowed  . Intimate partner violence:    Fear of current or ex partner: Not on file    Emotionally abused: Not on file    Physically abused: Not on file    Forced sexual activity: Not on file  Other Topics Concern  . Not on file  Social History Narrative  . Not on file    Outpatient Encounter Medications as of 12/16/2018  Medication Sig  . atorvastatin (LIPITOR) 20 MG tablet Take 1 tablet (20 mg total) by mouth daily.  . busPIRone  (BUSPAR) 15 MG tablet Take 1 tablet (15 mg total) by mouth 2 (two) times daily.  . Calcium Citrate-Vitamin D (CITRACAL PETITES/VITAMIN D) 200-250 MG-UNIT TABS Take 1 tablet by mouth 2 (two) times daily.  . clopidogrel (PLAVIX) 75 MG tablet Take 1 tablet (75 mg total) by mouth daily.  Marland Kitchen. donepezil (ARICEPT) 10 MG tablet Take 1 tablet (10 mg total) by mouth at bedtime.  . isosorbide mononitrate (IMDUR) 30 MG 24 hr tablet Take 1 tablet (30 mg total) by mouth 2 (two) times daily.  Marland Kitchen. lisinopril (ZESTRIL) 5 MG tablet Take 1 tablet (5 mg total) by mouth daily.  . memantine (NAMENDA) 5 MG tablet Take 1 tablet (5 mg total) by mouth 2 (two) times daily for 30 days.  . mirabegron ER (MYRBETRIQ)  50 MG TB24 tablet Take 1 tablet (50 mg total) by mouth daily.  . nitroGLYCERIN (NITROSTAT) 0.4 MG SL tablet Place 1 tablet (0.4 mg total) under the tongue every 5 (five) minutes as needed.  . nystatin cream (MYCOSTATIN) Apply 1 application topically 2 (two) times daily.  . ondansetron (ZOFRAN-ODT) 4 MG disintegrating tablet Take 1 tablet (4 mg total) by mouth every 6 (six) hours as needed for nausea or vomiting.  . risperiDONE (RISPERDAL) 0.25 MG tablet Take one tablet by mouth at noon  . risperiDONE (RISPERDAL) 0.5 MG tablet TAKE ONE-HALF TO ONE TABLET BY MOUTH TWICE DAILY AS NEEDED (FOR AGITATION)  . zoledronic acid (RECLAST) 5 MG/100ML SOLN injection Inject 5 mg into the vein PRO. Yearly x 5  . [DISCONTINUED] atorvastatin (LIPITOR) 20 MG tablet Take 1 tablet (20 mg total) by mouth daily.  . [DISCONTINUED] busPIRone (BUSPAR) 15 MG tablet TAKE 1 TABLET BY MOUTH TWICE DAILY  . [DISCONTINUED] Calcium Citrate-Vitamin D (CITRACAL PETITES/VITAMIN D) 200-250 MG-UNIT TABS Take 1 tablet by mouth 2 (two) times daily.  . [DISCONTINUED] clopidogrel (PLAVIX) 75 MG tablet TAKE 1 TABLET BY MOUTH EVERY DAY  . [DISCONTINUED] donepezil (ARICEPT) 10 MG tablet TAKE 1 TABLET BY MOUTH AT BEDTIME  . [DISCONTINUED] isosorbide mononitrate  (IMDUR) 30 MG 24 hr tablet Take 1 tablet (30 mg total) by mouth 2 (two) times daily.  . [DISCONTINUED] lisinopril (PRINIVIL,ZESTRIL) 5 MG tablet Take 1 tablet (5 mg total) by mouth daily.  . [DISCONTINUED] memantine (NAMENDA) 5 MG tablet Take 1 tablet (5 mg total) by mouth 2 (two) times daily for 30 days.  . [DISCONTINUED] MYRBETRIQ 50 MG TB24 tablet TAKE 1 TABLET BY MOUTH EVERY DAY  . [DISCONTINUED] nitroGLYCERIN (NITROSTAT) 0.4 MG SL tablet Place 1 tablet (0.4 mg total) under the tongue every 5 (five) minutes as needed.  . [DISCONTINUED] risperiDONE (RISPERDAL) 0.25 MG tablet TAKE 1 TABLET BY MOUTH AT NOON  . [DISCONTINUED] risperiDONE (RISPERDAL) 0.5 MG tablet TAKE ONE-HALF TO ONE TABLET BY MOUTH TWICE DAILY AS NEEDED (FOR AGITATION)   No facility-administered encounter medications on file as of 12/16/2018.     Allergies  Allergen Reactions  . Azithromycin   . Cephalexin     Causes her to shake  . Ciprofloxacin Nausea And Vomiting and Other (See Comments)    jittery  . Codeine     REACTION: shakiness  . Contrast Media [Iodinated Diagnostic Agents]   . Latex   . Nitrofuran Derivatives   . Nsaids   . Nutritional Supplements   . Penicillins     REACTION: shakiness  . Phenazopyridine   . Quinolones   . Sulfa Antibiotics   . Toviaz [Fesoterodine Fumarate Er]   . Trimethoprim     Review of Systems  Constitutional: Negative for chills, fatigue, fever and unexpected weight change.  Respiratory: Negative for cough, shortness of breath and wheezing.   Cardiovascular: Negative for chest pain and palpitations.  Gastrointestinal: Negative for blood in stool, constipation, diarrhea, nausea and vomiting.  Genitourinary: Positive for frequency and urgency. Negative for decreased urine volume and difficulty urinating.  Neurological: Positive for tremors and weakness. Negative for dizziness, seizures, syncope, facial asymmetry, speech difficulty, light-headedness, numbness and headaches.   Psychiatric/Behavioral: Positive for agitation and confusion.  All other systems reviewed and are negative.        Observations/Objective: No vital signs or physical exam, this was a telephone or virtual health encounter.  Pt alert and oriented, answers all questions appropriately, and able to speak  in full sentences.    Assessment and Plan: Adree was seen today for medical management of chronic issues.  Diagnoses and all orders for this visit:  Lewy body dementia with behavioral disturbance (HCC) -     Referral to Chronic Care Management Services -     busPIRone (BUSPAR) 15 MG tablet; Take 1 tablet (15 mg total) by mouth 2 (two) times daily. -     donepezil (ARICEPT) 10 MG tablet; Take 1 tablet (10 mg total) by mouth at bedtime. -     memantine (NAMENDA) 5 MG tablet; Take 1 tablet (5 mg total) by mouth 2 (two) times daily for 30 days. -     risperiDONE (RISPERDAL) 0.25 MG tablet; Take one tablet by mouth at noon -     risperiDONE (RISPERDAL) 0.5 MG tablet; TAKE ONE-HALF TO ONE TABLET BY MOUTH TWICE DAILY AS NEEDED (FOR AGITATION)  Weakness Need for hospital bed. Pt requires re-positioning of the body in ways that cannot be achieved with an ordinary bed or wedge pillow, to eliminate pain, reduce pressure, or treat existence of pressure sores. Pt requires the head of the bed to be elevated at least 30 degrees or more most of the time. Pt requires frequent / immediate changes in body positions which cannot be achieved with a normal bed.  -     Referral to Chronic Care Management Services  At high risk for falls -     Referral to Chronic Care Management Services  Essential hypertension, benign -     Referral to Chronic Care Management Services -     lisinopril (ZESTRIL) 5 MG tablet; Take 1 tablet (5 mg total) by mouth daily.  Atherosclerosis of native coronary artery of native heart without angina pectoris -     Referral to Chronic Care Management Services -     clopidogrel  (PLAVIX) 75 MG tablet; Take 1 tablet (75 mg total) by mouth daily. -     isosorbide mononitrate (IMDUR) 30 MG 24 hr tablet; Take 1 tablet (30 mg total) by mouth 2 (two) times daily. -     nitroGLYCERIN (NITROSTAT) 0.4 MG SL tablet; Place 1 tablet (0.4 mg total) under the tongue every 5 (five) minutes as needed.  Hyperlipidemia, unspecified hyperlipidemia type -     Referral to Chronic Care Management Services -     atorvastatin (LIPITOR) 20 MG tablet; Take 1 tablet (20 mg total) by mouth daily.  Simple chronic bronchitis (HCC) -     Referral to Chronic Care Management Services  Urinary frequency -     mirabegron ER (MYRBETRIQ) 50 MG TB24 tablet; Take 1 tablet (50 mg total) by mouth daily.  Continue all medications as prescribed. Report any new or worsening symptoms. Will order a hospital bed. Diet and exercise encouraged.  Reevaluate in 3 months.  Will place referral for CCM.    Follow Up Instructions: Return in about 3 months (around 03/17/2019), or if symptoms worsen or fail to improve, for dementia, COPD, HTN, Hyperlipidemia.    I discussed the assessment and treatment plan with the patient. The patient was provided an opportunity to ask questions and all were answered. The patient agreed with the plan and demonstrated an understanding of the instructions.   The patient was advised to call back or seek an in-person evaluation if the symptoms worsen or if the condition fails to improve as anticipated.  The above assessment and management plan was discussed with the patient. The patient verbalized understanding of and  has agreed to the management plan. Patient is aware to call the clinic if symptoms persist or worsen. Patient is aware when to return to the clinic for a follow-up visit. Patient educated on when it is appropriate to go to the emergency department.    I provided 25 minutes of non-face-to-face time during this encounter. The call started at 1405. The call ended at 1430.    Kari Baars, FNP-C [facility] [address] [place], [place] [address] [phone]

## 2018-12-17 ENCOUNTER — Ambulatory Visit: Payer: Self-pay | Admitting: Licensed Clinical Social Worker

## 2018-12-17 DIAGNOSIS — J410 Simple chronic bronchitis: Secondary | ICD-10-CM

## 2018-12-17 DIAGNOSIS — I10 Essential (primary) hypertension: Secondary | ICD-10-CM

## 2018-12-17 DIAGNOSIS — Z9181 History of falling: Secondary | ICD-10-CM

## 2018-12-17 DIAGNOSIS — R531 Weakness: Secondary | ICD-10-CM

## 2018-12-17 DIAGNOSIS — F02818 Dementia in other diseases classified elsewhere, unspecified severity, with other behavioral disturbance: Secondary | ICD-10-CM

## 2018-12-17 DIAGNOSIS — F0281 Dementia in other diseases classified elsewhere with behavioral disturbance: Secondary | ICD-10-CM

## 2018-12-17 DIAGNOSIS — F419 Anxiety disorder, unspecified: Secondary | ICD-10-CM

## 2018-12-17 DIAGNOSIS — G3183 Dementia with Lewy bodies: Principal | ICD-10-CM

## 2018-12-17 NOTE — Patient Instructions (Signed)
Licensed Clinical Social Worker Visit Information  Materials provided: No  [name], son of client, were given information about Chronic Care Management services today including:  1. CCM service includes personalized support from designated clinical staff supervised by her physician, including individualized plan of care and coordination with other care providers 2. 24/7 contact phone numbers for assistance for urgent and routine care needs. 3. Service will only be billed when office clinical staff spend 20 minutes or more in a month to coordinate care. 4. Only one practitioner may furnish and bill the service in a calendar month. 5. The patient may stop CCM services at any time (effective at the end of the month) by phone call to the office staff. 6. The patient will be responsible for cost sharing (co-pay) of up to 20% of the service fee (after annual deductible is met).  [name], son of patient, did not agree to services and wishes to consider information provided before deciding about enrollment in CCM services.   Follow Up Plan:  LCSW to call [name],son of client, in next 2 weeks to talk further with client or [name] about CCM program services.  [name], son of client verbalized understanding of instructions provided today and declined a print copy of patient instruction materials.  [name] MSW, LCSW Licensed Clinical Social Worker [facility] [phone]

## 2018-12-17 NOTE — Chronic Care Management (AMB) (Signed)
  Care Management Note   [name] is a 85 y.o. year old female who is a primary care patient of [name], [name], FNP. The CM team was consulted for assistance with chronic disease management and care coordination.   I reached out to [name], son of client, by phone today.   [name], son of client were given information about Chronic Care Management services today including:  1. CCM service includes personalized support from designated clinical staff supervised by her physician, including individualized plan of care and coordination with other care providers 2. 24/7 contact phone numbers for assistance for urgent and routine care needs. 3. Service will only be billed when office clinical staff spend 20 minutes or more in a month to coordinate care. 4. Only one practitioner may furnish and bill the service in a calendar month. 5. The patient may stop CCM services at any time (effective at the end of the month) by phone call to the office staff. 6. The patient will be responsible for cost sharing (co-pay) of up to 20% of the service fee (after annual deductible is met). [name], son of patient did not agree to services and wish to consider information provided before deciding about enrollment in CCM services.    Review of patient status, including review of consultants reports, relevant laboratory and other test results, and collaboration with appropriate care team members and the patient's provider was performed as part of comprehensive patient evaluation and provision of chronic care management services.   LCSW collaborated with RN CM regarding client interest in CCM program. [name], son of client, reported that he was in process of obtaining a hospital bed for client to use in the home. [name] agreed for RN CM to call him to talk about nursing needs of client and agreed for nurse to mail him information on CCM program services.    Follow Up Plan: LCSW to call [name], son of client , in next 2 weeks to talk with client or her son further about CCM program services.  [name].[name] MSW, LCSW Licensed Clinical Social Worker [facility] [phone]

## 2018-12-21 NOTE — Addendum Note (Signed)
Addended by: Sonny Masters on: 12/21/2018 08:03 AM   Modules accepted: Orders

## 2018-12-22 ENCOUNTER — Ambulatory Visit: Payer: Medicare HMO | Admitting: *Deleted

## 2018-12-22 DIAGNOSIS — N3946 Mixed incontinence: Secondary | ICD-10-CM

## 2018-12-22 DIAGNOSIS — F0281 Dementia in other diseases classified elsewhere with behavioral disturbance: Secondary | ICD-10-CM

## 2018-12-22 DIAGNOSIS — J410 Simple chronic bronchitis: Secondary | ICD-10-CM

## 2018-12-22 DIAGNOSIS — G3183 Dementia with Lewy bodies: Principal | ICD-10-CM

## 2018-12-22 DIAGNOSIS — Z9181 History of falling: Secondary | ICD-10-CM

## 2018-12-22 NOTE — Chronic Care Management (AMB) (Signed)
  Chronic Care Management   RN Telephone Consult Note  12/22/2018 Name: [name] MRN: [id number] DOB: 08/20/1931  Referred by: Sonny Masters, FNP Reason for referral : Chronic Care Management (RNCM telephone consult)  [name] is a 85 y.o. year old female who sees [name], Doralee Albino, FNP for primary care. The CM team was consulted for assistance with care coordination and chronic disease management. I spoke with [name], [name] and healthcare proxy today by telephone. He has talked with Suzanne Needle "Scott" Forrest, LCSW with the [facility] CCM Team regarding her psychosocial needs and today his primary concern today is obtaining a mechanical hospital bed to help with positioning.   Goals Addressed      Patient Stated   . "We really need to get her an adjustable hospital bed" (pt-stated)       Current Barriers:  Marland Kitchen Knowledge Deficits related to process for obtaining a hospital bed.  Nurse Case Manager Clinical Goal(s):  Marland Kitchen Over the next 7 days, patient will work with RN Case Production designer, [employer] and [employer] to address needs related to obtaining a mechanical hospital bed.  Interventions:  . Planned ollaboration with [facility]/DME Supply Nurse regarding status of order for hospital bed.  Marland Kitchen EMR reviewed. Order for Hospital Bed completed yesterday by PCP.  Patient Self Care Activities:  . Currently UNABLE TO independently provide her own care due to dementia.       Follow Up Plan:  RN Case Manager to follow up with [name] by telephone over the next 24 hours.    Demetrios Loll, RN-BC, BSN Nurse Case Manager [facility] [phone]

## 2018-12-22 NOTE — Patient Instructions (Signed)
Visit Information  Goals Addressed      Patient Stated   . "We really need to get her an adjustable hospital bed" (pt-stated)       Current Barriers:  Marland Kitchen Knowledge Deficits related to process for obtaining a hospital bed.  Nurse Case Manager Clinical Goal(s):  Marland Kitchen Over the next 7 days, patient will work with RN Case Freight forwarder and [place] to address needs related to obtaining a mechanical hospital bed.  Interventions:  . Planned ollaboration with [facility]/DME Supply Nurse regarding status of order for hospital bed.  Marland Kitchen EMR reviewed. Order for Hospital Bed completed yesterday by PCP.  Patient Self Care Activities:  . Currently UNABLE TO independently provide her own care due to dementia.  Initial goal documentation         The patient verbalized understanding of instructions provided today and declined a print copy of patient instruction materials.   The CM team will reach out to the patient again over the next 2 days.    Ms. Geisel was given information about Chronic Care Management services today including:  1. CCM service includes personalized support from designated clinical staff supervised by her physician, including individualized plan of care and coordination with other care providers 2. 24/7 contact phone numbers for assistance for urgent and routine care needs. 3. Service will only be billed when office clinical staff spend 20 minutes or more in a month to coordinate care. 4. Only one practitioner may furnish and bill the service in a calendar month. 5. The patient may stop CCM services at any time (effective at the end of the month) by phone call to the office staff. 6. The patient will be responsible for cost sharing (co-pay) of up to 20% of the service fee (after annual deductible is met).  Patient agreed to services and verbal consent obtained.   Chong Sicilian, RN-BC, BSN Nurse Case Manager [place] [phone]

## 2018-12-24 ENCOUNTER — Ambulatory Visit (INDEPENDENT_AMBULATORY_CARE_PROVIDER_SITE_OTHER): Payer: Medicare HMO | Admitting: Licensed Clinical Social Worker

## 2018-12-24 DIAGNOSIS — G3183 Dementia with Lewy bodies: Secondary | ICD-10-CM

## 2018-12-24 DIAGNOSIS — J410 Simple chronic bronchitis: Secondary | ICD-10-CM

## 2018-12-24 DIAGNOSIS — F0281 Dementia in other diseases classified elsewhere with behavioral disturbance: Secondary | ICD-10-CM

## 2018-12-24 DIAGNOSIS — F419 Anxiety disorder, unspecified: Secondary | ICD-10-CM

## 2018-12-24 DIAGNOSIS — I10 Essential (primary) hypertension: Secondary | ICD-10-CM | POA: Diagnosis not present

## 2018-12-24 DIAGNOSIS — Z9181 History of falling: Secondary | ICD-10-CM

## 2018-12-24 DIAGNOSIS — R531 Weakness: Secondary | ICD-10-CM

## 2018-12-24 DIAGNOSIS — F02818 Dementia in other diseases classified elsewhere, unspecified severity, with other behavioral disturbance: Secondary | ICD-10-CM

## 2018-12-24 NOTE — Chronic Care Management (AMB) (Signed)
  Care Management Note   Markeisha Mancias is a 85 y.o. year old female who is a primary care patient of [name], [name], FNP. The CM team was consulted for assistance with chronic disease management and care coordination.   I reached out to [employer] by phone today.   Ms. Arianny, Pun were given information about Chronic Care Management services today including:  1. CCM service includes personalized support from designated clinical staff supervised by her physician, including individualized plan of care and coordination with other care providers 2. 24/7 contact phone numbers for assistance for urgent and routine care needs. 3. Service will only be billed when office clinical staff spend 20 minutes or more in a month to coordinate care. 4. Only one practitioner may furnish and bill the service in a calendar month. 5. The patient may stop CCM services at any time (effective at the end of the month) by phone call to the office staff. 6. The patient will be responsible for cost sharing (co-pay) of up to 20% of the service fee (after annual deductible is met). Patient/son [name]  agreed to services and verbal consent obtained.    Review of patient status, including review of consultants reports, relevant laboratory and other test results, and collaboration with appropriate care team members and the patient's provider was performed as part of comprehensive patient evaluation and provision of chronic care management services.   Social Determinants of Health:Risk for Depression; Fall risk; weakness  Goals Addressed            This Visit's Progress   . Client to communicate with LCSW in next 30 days to discuss health needs management of client (pt-stated)       Current Barriers:  . ADL IADL limitations  . Weakness, fall potential . Needs help with ADLs  Clinical Social Work Clinical Goal(s):  Marland Kitchen Over the next 30 days, client/[name], son of client  will work with SW to address  concerns related to health needs management for client  Interventions: . Provided patient/son with information about CCM program services with RN and LCSW . Discussed health care needs of client . Discussed fact that client is scheduled to receive hospital bed next Tuesday. . Encouraged client or son to talk with RN CM to discuss nursing needs of client . Encouraged client/son to communicate as needed with Baruch Gouty, FNP regarding medical needs of client . Provided counseling support for client/son  Patient Self Care Activities:  . Attends all scheduled provider appointments  Plan:   Client to attend scheduled medical appointments Client/son, [name], to communicate with RN CM as needed to discuss nursing needs of client LCSW to call client/son, [name], in next 3 weeks to discuss with client/son the management of health needs of client Client/son, [name], to communicate with Darla Lesches, FNP regarding the medical needs of client  Initial goal documentation    Follow Up Plan: LCSW to call client or son, [name], in next 3 weeks to talk with client or her son about client's management of health needs of client.  Norva Riffle.Desiray Orchard MSW, LCSW Licensed Clinical Social Worker [facility] [phone]

## 2018-12-24 NOTE — Patient Instructions (Signed)
Licensed Clinical Social Worker Visit Information  Goals we discussed today:  Goals Addressed            This Visit's Progress   . Client to communicate with LCSW in next 30 days to discuss health needs management of client (pt-stated)       Current Barriers:  . ADL IADL limitations  . Weakness, fall potential . Needs help with ADLs  Clinical Social Work Clinical Goal(s):  Marland Kitchen Over the next 30 days, [name], son of client  will work with SW to address concerns related to health needs management for client  Interventions: . Provided patient/son with information about CCM program services with RN and LCSW . Discussed health care needs of client . Discussed fact that client is scheduled to receive hospital bed next Tuesday. . Encouraged client or son to talk with RN CM to discuss nursing needs of client . Encouraged client/son to communicate as needed with Baruch Gouty, FNP regarding medical needs of client . Provided counseling support for client/son  Patient Self Care Activities:  . Attends all scheduled provider appointments  Plan:   Client to attend scheduled medical appointments Client/son, [name], to communicate with RN CM as needed to discuss nursing needs of client LCSW to call client/son, [name], in next 3 weeks to discuss with client/son the management of health needs of client [name] to communicate as needed with Suzanne Lesches FNP regarding medical needs of client  Initial goal documentation     Materials provided: No  [name], were given information about Chronic Care Management services today including:  1. CCM service includes personalized support from designated clinical staff supervised by her physician, including individualized plan of care and coordination with other care providers 2. 24/7 contact phone numbers for assistance for urgent and routine care needs. 3. Service will only be billed when office clinical staff spend 20  minutes or more in a month to coordinate care. 4. Only one practitioner may furnish and bill the service in a calendar month. 5. The patient may stop CCM services at any time (effective at the end of the month) by phone call to the office staff. 6. The patient will be responsible for cost sharing (co-pay) of up to 20% of the service fee (after annual deductible is met).  Patient [name] [name] agreed to services and verbal consent obtained.   Follow up Plan:  LCSW to call client/son, [name], in next 3 weeks to discuss with client/son the client's management of health needs of client.   The patient/son, [name], verbalized understanding of instructions provided today and declined a print copy of patient instruction materials.   Norva Riffle.Yarelie Hams MSW, LCSW Licensed Clinical Social Worker [facility] [phone]

## 2018-12-28 ENCOUNTER — Telehealth: Payer: Self-pay | Admitting: Family Medicine

## 2018-12-28 ENCOUNTER — Telehealth: Payer: Medicare HMO | Admitting: *Deleted

## 2018-12-28 DIAGNOSIS — S329XXA Fracture of unspecified parts of lumbosacral spine and pelvis, initial encounter for closed fracture: Secondary | ICD-10-CM | POA: Diagnosis not present

## 2018-12-28 DIAGNOSIS — R531 Weakness: Secondary | ICD-10-CM | POA: Diagnosis not present

## 2018-12-28 DIAGNOSIS — F0391 Unspecified dementia with behavioral disturbance: Secondary | ICD-10-CM | POA: Diagnosis not present

## 2018-12-31 ENCOUNTER — Ambulatory Visit (INDEPENDENT_AMBULATORY_CARE_PROVIDER_SITE_OTHER): Payer: Medicare HMO | Admitting: Licensed Clinical Social Worker

## 2018-12-31 DIAGNOSIS — F0281 Dementia in other diseases classified elsewhere with behavioral disturbance: Secondary | ICD-10-CM | POA: Diagnosis not present

## 2018-12-31 DIAGNOSIS — J410 Simple chronic bronchitis: Secondary | ICD-10-CM | POA: Diagnosis not present

## 2018-12-31 DIAGNOSIS — I10 Essential (primary) hypertension: Secondary | ICD-10-CM | POA: Diagnosis not present

## 2018-12-31 DIAGNOSIS — R531 Weakness: Secondary | ICD-10-CM

## 2018-12-31 DIAGNOSIS — Z9181 History of falling: Secondary | ICD-10-CM

## 2018-12-31 DIAGNOSIS — F02818 Dementia in other diseases classified elsewhere, unspecified severity, with other behavioral disturbance: Secondary | ICD-10-CM

## 2018-12-31 DIAGNOSIS — G3183 Dementia with Lewy bodies: Secondary | ICD-10-CM

## 2018-12-31 DIAGNOSIS — F419 Anxiety disorder, unspecified: Secondary | ICD-10-CM

## 2018-12-31 NOTE — Patient Instructions (Signed)
Licensed Clinical Social Worker Visit Information  Goals we discussed today:  Goals Addressed            This Visit's Progress   . Client to communicate with LCSW in next 30 days to discuss health needs management of client (pt-stated)       Current Barriers:  . ADL IADL limitations  . Weakness, fall potential . Needs help with ADLs  Clinical Social Work Clinical Goal(s):  Marland Kitchen Over the next 30 days, client/[name], son of client  will work with LCSW to address concerns related to health needs management for client  . Interventions: . Discussed health care needs of client . Encouraged client or son to talk with RN CM to discuss nursing needs of client . Encouraged client/son to communicate as needed with Sonny Masters, FNP regarding medical needs of client . Provided counseling support  . Talked with son of client about client use of hospital bed  Patient Self Care Activities:  . Attends all scheduled provider appointments  Plan:  Client to attend scheduled medical appointments Client/son, [name], to communicate with RN CM as needed to discuss nursing needs of client LCSW to call client/son, [name], in next 3 weeks to discuss with client/son the management of health needs of client  Initial goal documentation      Materials Provided: No  Follow Up Plan: LCSW to call client or son, [name], in next 3 weeks to discuss with client or her son the management of  Health needs of client.  The patient verbalized understanding of instructions provided today and declined a print copy of patient instruction materials.   Kelton Pillar.Lakita Sahlin MSW, LCSW Licensed Clinical Social Worker [facility] [phone]

## 2018-12-31 NOTE — Chronic Care Management (AMB) (Signed)
  Care Management Note   Roshawna Tewes is a 85 y.o. year old female who is a primary care patient of [name], Doralee Albino, FNP. The CM team was consulted for assistance with chronic disease management and care coordination.   I reached out to Rickey Primus, [place], by phone today.   Review of patient status, including review of consultants reports, relevant laboratory and other test results, and collaboration with appropriate care team members and the patient's provider was performed as part of comprehensive patient evaluation and provision of chronic care management services.   Social Determinants of Health:Risk for Depression    Chronic Care Management from 12/24/2018 in [facility]  PHQ-9 Total Score  10     Goals Addressed            This Visit's Progress   . Client to communicate with LCSW in next 30 days to discuss health needs management of client (pt-stated)       Current Barriers:  . ADL IADL limitations  . Weakness, fall potential . Needs help with ADLs  Clinical Social Work Clinical Goal(s):  Marland Kitchen Over the next 30 days, client/[name], son of client  will work with LCSW to address concerns related to health needs management for client  . Interventions: . Discussed health care needs of client . Encouraged client or son to talk with RN CM to discuss nursing needs of client . Encouraged client/son to communicate as needed with Sonny Masters, FNP regarding medical needs of client  . Discussed client's use of hospital bed. . Provided counseling support  Patient Self Care Activities:  . Attends all scheduled provider appointments  Plan:   Client to attend scheduled medical appointments Client/son, [name], to communicate with RN CM as needed to discuss nursing needs of client LCSW to call client/son, [name], in next 3 weeks to discuss with client/son the management of health needs of client  Initial goal documentation    LCSW spoke with  [name], son of client, via phone. [name] reported that client now has hospital bed and use of this bed is helpful to client. He said client needs help with ADLs and is weak occasionally and has to take rest breaks. He said she is eating adequately, sleeping adequately and taking medications as prescribed. He said she uses Ensure supplement as scheduled.   Follow Up Plan: LCSW to call client or son, [name], in next 30 days to discuss with client/son the management of health needs of client.  Kelton Pillar.Jorell Agne MSW, LCSW Licensed Clinical Social Worker [facility] [phone]

## 2019-01-21 ENCOUNTER — Ambulatory Visit: Payer: Self-pay | Admitting: Licensed Clinical Social Worker

## 2019-01-21 DIAGNOSIS — R531 Weakness: Secondary | ICD-10-CM

## 2019-01-21 DIAGNOSIS — J410 Simple chronic bronchitis: Secondary | ICD-10-CM

## 2019-01-21 DIAGNOSIS — F419 Anxiety disorder, unspecified: Secondary | ICD-10-CM

## 2019-01-21 DIAGNOSIS — F0281 Dementia in other diseases classified elsewhere with behavioral disturbance: Secondary | ICD-10-CM

## 2019-01-21 DIAGNOSIS — I10 Essential (primary) hypertension: Secondary | ICD-10-CM

## 2019-01-21 DIAGNOSIS — Z9181 History of falling: Secondary | ICD-10-CM

## 2019-01-21 DIAGNOSIS — F02818 Dementia in other diseases classified elsewhere, unspecified severity, with other behavioral disturbance: Secondary | ICD-10-CM

## 2019-01-21 NOTE — Chronic Care Management (AMB) (Signed)
  Care Management Note   [name] is a 85 y.o. year old female who is a primary care patient of [name], Doralee Albino, FNP. The CM team was consulted for assistance with chronic disease management and care coordination.   I reached out to [employer], by phone today.   Review of patient status, including review of consultants reports, relevant laboratory and other test results, and collaboration with appropriate care team members and the patient's provider was performed as part of comprehensive patient evaluation and provision of chronic care management services.   Social Determinants of Health;Risk for falls; needs assistance with ADLs; risk for Depression    Chronic Care Management from 12/24/2018 in [facility]  PHQ-9 Total Score  10     Goals Addressed            This Visit's Progress   . Client to communicate with LCSW in next 30 days to discuss health needs management of client (pt-stated)       Current Barriers:  . ADL IADL limitations  . Weakness, fall potential . Needs help with ADLs  Clinical Social Work Clinical Goal(s):  Marland Kitchen Over the next 30 days, [name], son of client  will work with LCSW to address concerns related to health needs management for client  . Interventions: . Discussed health care needs of client . Encouraged client or son to talk with RN CM to discuss nursing needs of client . Encouraged client/son to communicate as needed with Sonny Masters, FNP regarding medical needs of client . Provided counseling support for client/son . Encouraged client/son of client to call LCSW as needed to talk about psychosocial needs of client.  . Talked with [name], son of client, about recent death of [name]  Patient Self Care Activities:  . Attends all scheduled provider appointments  Plan:   Client to attend scheduled medical appointments Client/son, [name], to communicate with RN CM as needed to discuss  nursing needs of client LCSW to call client/son, [name], in next 3 weeks to discuss with client/son the management of health needs of client  Initial goal documentation     [name], son of client, said client has potential for falls.  She has tremors, shakiness when she tries to walk.  She eats adequately.  Client has prescribed medications. [name] said that the hospital bed works well with client.  Client also has a lift chair to help her stand.  [name] said client has to take rest breaks when walking.  [name] said that his [name] passed away yesterday.LCSW expressed sympathy to [name] on 2019/01/31 regarding the recent death of [name].  [name] said he did not have any nursing needs for client to report at present. LCSW thanked [name] for phone call with LCSW and encouraged client or [name] to call LCSW as needed to discuss psychosocial needs of client  Follow Up Plan: LCSW to call client/son, [name], in next 3 weeks to discuss with client/son the management of health needs of client.  Kelton Pillar.Threasa Kinch MSW, LCSW Licensed Clinical Social Worker [facility] [phone]

## 2019-01-21 NOTE — Patient Instructions (Signed)
Licensed Clinical Social Worker Visit Information  Goals we discussed today:  Goals Addressed            This Visit's Progress   . Client to communicate with LCSW in next 30 days to discuss health needs management of client (pt-stated)       Current Barriers:  . ADL IADL limitations  . Weakness, fall potential . Needs help with ADLs  Clinical Social Work Clinical Goal(s):  Marland Kitchen Over the next 30 days, [name], son of client  will work with LCSW to address concerns related to health needs management for client  . Interventions: . Discussed health care needs of client . Encouraged client or son to talk with RN CM to discuss nursing needs of client . Encouraged client/son to communicate as needed with Sonny Masters, FNP regarding medical needs of client . Provided counseling support for client/son . Encouraged client/son of client to call LCSW as needed to talk about psychosocial needs of client.  . Talked with [name], son of client, about recent death of [name]'s brother  Patient Self Care Activities:  . Attends all scheduled provider appointments  Plan:  Client to attend scheduled medical appointments Client/son, [name], to communicate with RN CM as needed to discuss nursing needs of client LCSW to call client/son, [name], in next 3 weeks to discuss with client/son the management of health needs of client  Initial goal documentation      Materials Provided: No  Follow Up Plan: LCSW to call client/son, [name], in next 3 weeks to discuss with client/son the management of health needs of client  The patient/son [name],  verbalized understanding of instructions provided today and declined a print copy of patient instruction materials.   Kelton Pillar.Derelle Cockrell MSW, LCSW Licensed Clinical Social Worker [facility] [phone]

## 2019-01-27 ENCOUNTER — Ambulatory Visit (INDEPENDENT_AMBULATORY_CARE_PROVIDER_SITE_OTHER): Payer: Medicare HMO | Admitting: Family Medicine

## 2019-01-27 ENCOUNTER — Other Ambulatory Visit: Payer: Self-pay

## 2019-01-27 ENCOUNTER — Encounter: Payer: Self-pay | Admitting: Family Medicine

## 2019-01-27 DIAGNOSIS — M7989 Other specified soft tissue disorders: Secondary | ICD-10-CM

## 2019-01-27 DIAGNOSIS — R531 Weakness: Secondary | ICD-10-CM | POA: Diagnosis not present

## 2019-01-27 DIAGNOSIS — F0391 Unspecified dementia with behavioral disturbance: Secondary | ICD-10-CM | POA: Diagnosis not present

## 2019-01-27 DIAGNOSIS — S329XXA Fracture of unspecified parts of lumbosacral spine and pelvis, initial encounter for closed fracture: Secondary | ICD-10-CM | POA: Diagnosis not present

## 2019-01-27 MED ORDER — FUROSEMIDE 20 MG PO TABS: 20.0000 mg | ORAL_TABLET | Freq: Every day | ORAL | 3 refills | Status: DC

## 2019-01-27 NOTE — Progress Notes (Signed)
Virtual Visit via telephone Note Due to COVID-19, visit is conducted virtually and was requested by patient. This visit type was conducted due to national recommendations for restrictions regarding the COVID-19 Pandemic (e.g. social distancing) in an effort to limit this patient's exposure and mitigate transmission in our community. All issues noted in this document were discussed and addressed.  A physical exam was not performed with this format.   I connected with [name] son on 01/27/19 at 0815 by telephone and verified that I am speaking with the correct person using two identifiers. [name] is currently located at home and family is currently with them during visit. The provider, Kari Baars, FNP is located in their office at time of visit.  I discussed the limitations, risks, security and privacy concerns of performing an evaluation and management service by telephone and the availability of in person appointments. I also discussed with the patient that there may be a patient responsible charge related to this service. The patient expressed understanding and agreed to proceed.  Subjective:  Patient ID: [name], female    DOB: 26-Mar-1931, 85 y.o.   MRN: [id number]  Chief Complaint:  Leg Swelling   HPI: [name] is a 85 y.o. female presenting on 01/27/2019 for Leg Swelling   Son states pt has increased leg swelling. States this started a few days ago. States the swelling is worse as the day goes by. States she does not elevate her legs. He states she is not complaining of shortness of breath or chest pain. Denies significant weight changes. No fatigue or exertional shortness of breath.    Relevant past medical, surgical, family, and social history reviewed and updated as indicated.  Allergies and medications reviewed and updated.   Past Medical History:  Diagnosis Date  . Anxiety   . COPD (chronic obstructive pulmonary disease) (HCC)   . Coronary  atherosclerosis of native coronary artery    DES LAD and BMS LAD 8/08  . Esophageal stricture   . Essential hypertension, benign   . GERD (gastroesophageal reflux disease)   . MI (myocardial infarction) (HCC) 2008  . Mixed hyperlipidemia   . Osteoporosis   . Pelvic fracture (HCC) 08/2014  . Resting tremor   . Rickets     Past Surgical History:  Procedure Laterality Date  . APPENDECTOMY    . Bilateral cataract surgery    . CARDIAC CATHETERIZATION    . CHOLECYSTECTOMY    . CORONARY ANGIOPLASTY WITH STENT PLACEMENT    . Left Sacroiliac Joint Injection  11/27/11   DR. O'TOOLE  . Left Trochanteric Bursa Injection  11/27/11   DR. O'TOOLE  . VEIN LIGATION AND STRIPPING    . YAG LASER APPLICATION  09/06/2012   Procedure: YAG LASER APPLICATION;  Surgeon: Susa Simmonds, MD;  Location: [facility];  Service: Ophthalmology;  Laterality: Left;    Social History   Socioeconomic History  . Marital status: Widowed    Spouse name: Not on file  . Number of children: 5  . Years of education: 11th  . Highest education level: 11th grade  Occupational History    Comment: Housewife  Social Needs  . Financial resource strain: Not hard at all  . Food insecurity:    Worry: Never true    Inability: Never true  . Transportation needs:    Medical: No    Non-medical: No  Tobacco Use  . Smoking status: Never Smoker  . Smokeless tobacco: Never Used  Substance and Sexual  Activity  . Alcohol use: No    Alcohol/week: 0.0 standard drinks  . Drug use: No  . Sexual activity: Not Currently  Lifestyle  . Physical activity:    Days per week: 0 days    Minutes per session: 0 min  . Stress: Not at all  Relationships  . Social connections:    Talks on phone: More than three times a week    Gets together: More than three times a week    Attends religious service: Never    Active member of club or organization: No    Attends meetings of clubs or organizations: Never    Relationship status: Widowed  .  Intimate partner violence:    Fear of current or ex partner: No    Emotionally abused: No    Physically abused: No    Forced sexual activity: No  Other Topics Concern  . Not on file  Social History Narrative   Patient has dementia and is dependent on children for care.    Outpatient Encounter Medications as of 01/27/2019  Medication Sig  . atorvastatin (LIPITOR) 20 MG tablet Take 1 tablet (20 mg total) by mouth daily.  . busPIRone (BUSPAR) 15 MG tablet Take 1 tablet (15 mg total) by mouth 2 (two) times daily.  . Calcium Citrate-Vitamin D (CITRACAL PETITES/VITAMIN D) 200-250 MG-UNIT TABS Take 1 tablet by mouth 2 (two) times daily.  . clopidogrel (PLAVIX) 75 MG tablet Take 1 tablet (75 mg total) by mouth daily.  Marland Kitchen. donepezil (ARICEPT) 10 MG tablet Take 1 tablet (10 mg total) by mouth at bedtime.  . furosemide (LASIX) 20 MG tablet Take 1 tablet (20 mg total) by mouth daily.  . isosorbide mononitrate (IMDUR) 30 MG 24 hr tablet Take 1 tablet (30 mg total) by mouth 2 (two) times daily.  Marland Kitchen. lisinopril (ZESTRIL) 5 MG tablet Take 1 tablet (5 mg total) by mouth daily.  . memantine (NAMENDA) 5 MG tablet Take 1 tablet (5 mg total) by mouth 2 (two) times daily for 30 days.  . mirabegron ER (MYRBETRIQ) 50 MG TB24 tablet Take 1 tablet (50 mg total) by mouth daily.  . nitroGLYCERIN (NITROSTAT) 0.4 MG SL tablet Place 1 tablet (0.4 mg total) under the tongue every 5 (five) minutes as needed.  . nystatin cream (MYCOSTATIN) Apply 1 application topically 2 (two) times daily.  . ondansetron (ZOFRAN-ODT) 4 MG disintegrating tablet Take 1 tablet (4 mg total) by mouth every 6 (six) hours as needed for nausea or vomiting.  . risperiDONE (RISPERDAL) 0.25 MG tablet Take one tablet by mouth at noon  . risperiDONE (RISPERDAL) 0.5 MG tablet TAKE ONE-HALF TO ONE TABLET BY MOUTH TWICE DAILY AS NEEDED (FOR AGITATION)  . zoledronic acid (RECLAST) 5 MG/100ML SOLN injection Inject 5 mg into the vein PRO. Yearly x 5   No  facility-administered encounter medications on file as of 01/27/2019.     Allergies  Allergen Reactions  . Azithromycin   . Cephalexin     Causes her to shake  . Ciprofloxacin Nausea And Vomiting and Other (See Comments)    jittery  . Codeine     REACTION: shakiness  . Contrast Media [Iodinated Diagnostic Agents]   . Latex   . Nitrofuran Derivatives   . Nsaids   . Nutritional Supplements   . Penicillins     REACTION: shakiness  . Phenazopyridine   . Quinolones   . Sulfa Antibiotics   . Toviaz [Fesoterodine Fumarate Er]   . Trimethoprim  Review of Systems  Constitutional: Negative for activity change, appetite change, chills, fatigue, fever and unexpected weight change.  Respiratory: Negative for cough, choking, chest tightness, shortness of breath and wheezing.   Cardiovascular: Positive for leg swelling. Negative for chest pain and palpitations.  Gastrointestinal: Negative for abdominal distention.  Genitourinary: Negative for decreased urine volume and difficulty urinating.  Neurological: Negative for dizziness, tremors, seizures, syncope, facial asymmetry, speech difficulty, weakness, light-headedness, numbness and headaches.  Psychiatric/Behavioral: Positive for confusion (baseline).  All other systems reviewed and are negative.        Observations/Objective: No vital signs or physical exam, this was a telephone or virtual health encounter.  Pt alert and oriented, answers all questions appropriately, and able to speak in full sentences.    Assessment and Plan: Arlicia was seen today for leg swelling.  Diagnoses and all orders for this visit:  Leg swelling Reported symptoms consistent with dependent edema. Elevate legs when able. Decrease sodium intake. Will add daily lasix. Report weight gain of 3 pounds in one day or 5 pounds in one week. Return in 2 weeks for reevaluation and labs.  -     furosemide (LASIX) 20 MG tablet; Take 1 tablet (20 mg total) by  mouth daily.     Follow Up Instructions: No follow-ups on file.    I discussed the assessment and treatment plan with the patient. The patient was provided an opportunity to ask questions and all were answered. The patient agreed with the plan and demonstrated an understanding of the instructions.   The patient was advised to call back or seek an in-person evaluation if the symptoms worsen or if the condition fails to improve as anticipated.  The above assessment and management plan was discussed with the patient. The patient verbalized understanding of and has agreed to the management plan. Patient is aware to call the clinic if symptoms persist or worsen. Patient is aware when to return to the clinic for a follow-up visit. Patient educated on when it is appropriate to go to the emergency department.    I provided 15 minutes of non-face-to-face time during this encounter. The call started at 0815. The call ended at 0830. The other time was used for coordination of care.    Kari Baars, FNP-C [facility] [address] [place], [place] [address] [phone]

## 2019-02-02 DIAGNOSIS — F0390 Unspecified dementia without behavioral disturbance: Secondary | ICD-10-CM | POA: Diagnosis not present

## 2019-02-10 ENCOUNTER — Ambulatory Visit (INDEPENDENT_AMBULATORY_CARE_PROVIDER_SITE_OTHER): Payer: Medicare HMO | Admitting: Family Medicine

## 2019-02-10 ENCOUNTER — Encounter: Payer: Self-pay | Admitting: Family Medicine

## 2019-02-10 ENCOUNTER — Encounter (HOSPITAL_COMMUNITY): Payer: Self-pay

## 2019-02-10 ENCOUNTER — Emergency Department (HOSPITAL_COMMUNITY): Payer: Medicare HMO

## 2019-02-10 ENCOUNTER — Other Ambulatory Visit: Payer: Self-pay

## 2019-02-10 ENCOUNTER — Inpatient Hospital Stay (HOSPITAL_COMMUNITY)
Admission: EM | Admit: 2019-02-10 | Discharge: 2019-02-14 | DRG: 871 | Disposition: A | Discharge status: Home Health | Payer: Medicare HMO | Attending: Family Medicine | Admitting: Family Medicine

## 2019-02-10 VITALS — BP 111/53 | HR 72 | Temp 101.4°F | Resp 40

## 2019-02-10 DIAGNOSIS — Z882 Allergy status to sulfonamides status: Secondary | ICD-10-CM | POA: Diagnosis not present

## 2019-02-10 DIAGNOSIS — G9341 Metabolic encephalopathy: Secondary | ICD-10-CM | POA: Diagnosis not present

## 2019-02-10 DIAGNOSIS — J410 Simple chronic bronchitis: Secondary | ICD-10-CM | POA: Diagnosis not present

## 2019-02-10 DIAGNOSIS — R0602 Shortness of breath: Secondary | ICD-10-CM

## 2019-02-10 DIAGNOSIS — R0600 Dyspnea, unspecified: Secondary | ICD-10-CM

## 2019-02-10 DIAGNOSIS — F0390 Unspecified dementia without behavioral disturbance: Secondary | ICD-10-CM | POA: Diagnosis not present

## 2019-02-10 DIAGNOSIS — A419 Sepsis, unspecified organism: Secondary | ICD-10-CM | POA: Diagnosis not present

## 2019-02-10 DIAGNOSIS — J189 Pneumonia, unspecified organism: Secondary | ICD-10-CM | POA: Diagnosis not present

## 2019-02-10 DIAGNOSIS — Z881 Allergy status to other antibiotic agents status: Secondary | ICD-10-CM | POA: Diagnosis not present

## 2019-02-10 DIAGNOSIS — Z818 Family history of other mental and behavioral disorders: Secondary | ICD-10-CM | POA: Diagnosis not present

## 2019-02-10 DIAGNOSIS — M810 Age-related osteoporosis without current pathological fracture: Secondary | ICD-10-CM | POA: Diagnosis present

## 2019-02-10 DIAGNOSIS — J9621 Acute and chronic respiratory failure with hypoxia: Secondary | ICD-10-CM | POA: Diagnosis not present

## 2019-02-10 DIAGNOSIS — E876 Hypokalemia: Secondary | ICD-10-CM | POA: Diagnosis present

## 2019-02-10 DIAGNOSIS — Z8249 Family history of ischemic heart disease and other diseases of the circulatory system: Secondary | ICD-10-CM | POA: Diagnosis not present

## 2019-02-10 DIAGNOSIS — I2510 Atherosclerotic heart disease of native coronary artery without angina pectoris: Secondary | ICD-10-CM | POA: Diagnosis present

## 2019-02-10 DIAGNOSIS — Z20828 Contact with and (suspected) exposure to other viral communicable diseases: Secondary | ICD-10-CM | POA: Diagnosis present

## 2019-02-10 DIAGNOSIS — R05 Cough: Secondary | ICD-10-CM | POA: Diagnosis not present

## 2019-02-10 DIAGNOSIS — E782 Mixed hyperlipidemia: Secondary | ICD-10-CM | POA: Diagnosis present

## 2019-02-10 DIAGNOSIS — R54 Age-related physical debility: Secondary | ICD-10-CM | POA: Diagnosis not present

## 2019-02-10 DIAGNOSIS — R531 Weakness: Secondary | ICD-10-CM

## 2019-02-10 DIAGNOSIS — K219 Gastro-esophageal reflux disease without esophagitis: Secondary | ICD-10-CM | POA: Diagnosis present

## 2019-02-10 DIAGNOSIS — I340 Nonrheumatic mitral (valve) insufficiency: Secondary | ICD-10-CM | POA: Diagnosis not present

## 2019-02-10 DIAGNOSIS — J449 Chronic obstructive pulmonary disease, unspecified: Secondary | ICD-10-CM | POA: Diagnosis present

## 2019-02-10 DIAGNOSIS — R509 Fever, unspecified: Secondary | ICD-10-CM | POA: Diagnosis present

## 2019-02-10 DIAGNOSIS — Z885 Allergy status to narcotic agent status: Secondary | ICD-10-CM

## 2019-02-10 DIAGNOSIS — I10 Essential (primary) hypertension: Secondary | ICD-10-CM | POA: Diagnosis present

## 2019-02-10 DIAGNOSIS — Z79899 Other long term (current) drug therapy: Secondary | ICD-10-CM | POA: Diagnosis not present

## 2019-02-10 DIAGNOSIS — Z66 Do not resuscitate: Secondary | ICD-10-CM | POA: Diagnosis present

## 2019-02-10 DIAGNOSIS — G3183 Dementia with Lewy bodies: Secondary | ICD-10-CM | POA: Diagnosis not present

## 2019-02-10 DIAGNOSIS — M7989 Other specified soft tissue disorders: Secondary | ICD-10-CM

## 2019-02-10 DIAGNOSIS — R6520 Severe sepsis without septic shock: Secondary | ICD-10-CM | POA: Diagnosis not present

## 2019-02-10 DIAGNOSIS — I252 Old myocardial infarction: Secondary | ICD-10-CM

## 2019-02-10 DIAGNOSIS — J181 Lobar pneumonia, unspecified organism: Secondary | ICD-10-CM | POA: Diagnosis not present

## 2019-02-10 DIAGNOSIS — J9601 Acute respiratory failure with hypoxia: Secondary | ICD-10-CM | POA: Diagnosis not present

## 2019-02-10 DIAGNOSIS — F0281 Dementia in other diseases classified elsewhere with behavioral disturbance: Secondary | ICD-10-CM | POA: Diagnosis not present

## 2019-02-10 DIAGNOSIS — I361 Nonrheumatic tricuspid (valve) insufficiency: Secondary | ICD-10-CM | POA: Diagnosis not present

## 2019-02-10 DIAGNOSIS — J9811 Atelectasis: Secondary | ICD-10-CM | POA: Diagnosis present

## 2019-02-10 DIAGNOSIS — Z7902 Long term (current) use of antithrombotics/antiplatelets: Secondary | ICD-10-CM | POA: Diagnosis not present

## 2019-02-10 DIAGNOSIS — J440 Chronic obstructive pulmonary disease with acute lower respiratory infection: Secondary | ICD-10-CM | POA: Diagnosis present

## 2019-02-10 DIAGNOSIS — Z9181 History of falling: Secondary | ICD-10-CM

## 2019-02-10 LAB — CBC WITH DIFFERENTIAL/PLATELET
Abs Immature Granulocytes: 0.14 10*3/uL — ABNORMAL HIGH (ref 0.00–0.07)
Basophils Absolute: 0.1 10*3/uL (ref 0.0–0.1)
Basophils Relative: 0 %
Eosinophils Absolute: 0.5 10*3/uL (ref 0.0–0.5)
Eosinophils Relative: 4 %
HCT: 35.8 % — ABNORMAL LOW (ref 36.0–46.0)
Hemoglobin: 11 g/dL — ABNORMAL LOW (ref 12.0–15.0)
Immature Granulocytes: 1 %
Lymphocytes Relative: 8 %
Lymphs Abs: 0.9 10*3/uL (ref 0.7–4.0)
MCH: 30.3 pg (ref 26.0–34.0)
MCHC: 30.7 g/dL (ref 30.0–36.0)
MCV: 98.6 fL (ref 80.0–100.0)
Monocytes Absolute: 1 10*3/uL (ref 0.1–1.0)
Monocytes Relative: 9 %
Neutro Abs: 9.3 10*3/uL — ABNORMAL HIGH (ref 1.7–7.7)
Neutrophils Relative %: 78 %
Platelets: 210 10*3/uL (ref 150–400)
RBC: 3.63 MIL/uL — ABNORMAL LOW (ref 3.87–5.11)
RDW: 14 % (ref 11.5–15.5)
WBC: 11.9 10*3/uL — ABNORMAL HIGH (ref 4.0–10.5)
nRBC: 0 % (ref 0.0–0.2)

## 2019-02-10 LAB — MAGNESIUM: Magnesium: 2.2 mg/dL (ref 1.7–2.4)

## 2019-02-10 LAB — URINALYSIS, ROUTINE W REFLEX MICROSCOPIC
Bilirubin Urine: NEGATIVE
Glucose, UA: NEGATIVE mg/dL
Ketones, ur: NEGATIVE mg/dL
Leukocytes,Ua: NEGATIVE
Nitrite: NEGATIVE
Protein, ur: NEGATIVE mg/dL
Specific Gravity, Urine: 1.015 (ref 1.005–1.030)
pH: 5 (ref 5.0–8.0)

## 2019-02-10 LAB — COMPREHENSIVE METABOLIC PANEL
ALT: 14 U/L (ref 0–44)
AST: 19 U/L (ref 15–41)
Albumin: 3.1 g/dL — ABNORMAL LOW (ref 3.5–5.0)
Alkaline Phosphatase: 70 U/L (ref 38–126)
Anion gap: 11 (ref 5–15)
BUN: 32 mg/dL — ABNORMAL HIGH (ref 8–23)
CO2: 28 mmol/L (ref 22–32)
Calcium: 9 mg/dL (ref 8.9–10.3)
Chloride: 103 mmol/L (ref 98–111)
Creatinine, Ser: 0.94 mg/dL (ref 0.44–1.00)
GFR calc Af Amer: >60 mL/min (ref >60)
GFR calc non Af Amer: 55 mL/min — ABNORMAL LOW (ref >60)
Glucose, Bld: 128 mg/dL — ABNORMAL HIGH (ref 70–99)
Potassium: 2.9 mmol/L — ABNORMAL LOW (ref 3.5–5.1)
Sodium: 142 mmol/L (ref 135–145)
Total Bilirubin: 0.9 mg/dL (ref 0.3–1.2)
Total Protein: 6.5 g/dL (ref 6.5–8.1)

## 2019-02-10 LAB — SARS CORONAVIRUS 2 BY RT PCR (HOSPITAL ORDER, PERFORMED IN ~~LOC~~ HOSPITAL LAB): SARS Coronavirus 2: NEGATIVE

## 2019-02-10 LAB — PROTIME-INR
INR: 1 (ref 0.8–1.2)
Prothrombin Time: 13.3 seconds (ref 11.4–15.2)

## 2019-02-10 LAB — LACTIC ACID, PLASMA
Lactic Acid, Venous: 1.3 mmol/L (ref 0.5–1.9)
Lactic Acid, Venous: 1.5 mmol/L (ref 0.5–1.9)

## 2019-02-10 MED ORDER — BUSPIRONE HCL 5 MG PO TABS
15.0000 mg | ORAL_TABLET | Freq: Two times a day (BID) | ORAL | Status: DC
  Administered 2019-02-10 – 2019-02-14 (×8): 15 mg via ORAL
  Filled 2019-02-10 (×8): qty 3

## 2019-02-10 MED ORDER — IPRATROPIUM-ALBUTEROL 0.5-2.5 (3) MG/3ML IN SOLN: 3.0000 mL | RESPIRATORY_TRACT | Status: DC | PRN

## 2019-02-10 MED ORDER — ATORVASTATIN CALCIUM 20 MG PO TABS
20.0000 mg | ORAL_TABLET | Freq: Every day | ORAL | Status: DC
  Administered 2019-02-11 – 2019-02-13 (×2): 20 mg via ORAL
  Filled 2019-02-10 (×2): qty 1

## 2019-02-10 MED ORDER — POTASSIUM CHLORIDE CRYS ER 20 MEQ PO TBCR
40.0000 meq | EXTENDED_RELEASE_TABLET | Freq: Once | ORAL | Status: AC
  Administered 2019-02-10: 40 meq via ORAL
  Filled 2019-02-10: qty 2

## 2019-02-10 MED ORDER — POTASSIUM CHLORIDE 10 MEQ/100ML IV SOLN
10.0000 meq | Freq: Once | INTRAVENOUS | Status: AC
  Administered 2019-02-10: 10 meq via INTRAVENOUS
  Filled 2019-02-10: qty 100

## 2019-02-10 MED ORDER — SODIUM CHLORIDE 0.9% FLUSH
3.0000 mL | Freq: Once | INTRAVENOUS | Status: AC
  Administered 2019-02-10: 3 mL via INTRAVENOUS

## 2019-02-10 MED ORDER — MEMANTINE HCL 10 MG PO TABS
5.0000 mg | ORAL_TABLET | Freq: Two times a day (BID) | ORAL | Status: DC
  Administered 2019-02-10 – 2019-02-14 (×8): 5 mg via ORAL
  Filled 2019-02-10 (×8): qty 1

## 2019-02-10 MED ORDER — POLYETHYLENE GLYCOL 3350 17 G PO PACK: 17.0000 g | PACK | Freq: Every day | ORAL | Status: DC | PRN

## 2019-02-10 MED ORDER — RISPERIDONE 0.5 MG PO TABS
0.2500 mg | ORAL_TABLET | Freq: Two times a day (BID) | ORAL | Status: DC | PRN
  Administered 2019-02-11 – 2019-02-13 (×4): 0.25 mg via ORAL
  Filled 2019-02-10 (×4): qty 1

## 2019-02-10 MED ORDER — POTASSIUM CHLORIDE IN NACL 40-0.9 MEQ/L-% IV SOLN
INTRAVENOUS | Status: DC
  Administered 2019-02-10: 75 mL/h via INTRAVENOUS

## 2019-02-10 MED ORDER — ONDANSETRON HCL 4 MG PO TABS: 4.0000 mg | ORAL_TABLET | Freq: Four times a day (QID) | ORAL | Status: DC | PRN

## 2019-02-10 MED ORDER — SODIUM CHLORIDE 0.9 % IV SOLN
100.0000 mg | Freq: Once | INTRAVENOUS | Status: AC
  Administered 2019-02-10: 18:00:00 100 mg via INTRAVENOUS
  Filled 2019-02-10 (×2): qty 100

## 2019-02-10 MED ORDER — POTASSIUM CHLORIDE CRYS ER 20 MEQ PO TBCR: 40.0000 meq | EXTENDED_RELEASE_TABLET | Freq: Once | ORAL | Status: DC

## 2019-02-10 MED ORDER — ALBUTEROL SULFATE (2.5 MG/3ML) 0.083% IN NEBU: 2.5000 mg | INHALATION_SOLUTION | RESPIRATORY_TRACT | Status: DC | PRN

## 2019-02-10 MED ORDER — LISINOPRIL 5 MG PO TABS
5.0000 mg | ORAL_TABLET | Freq: Every day | ORAL | Status: DC
  Administered 2019-02-11: 5 mg via ORAL
  Filled 2019-02-10: qty 1

## 2019-02-10 MED ORDER — ACETAMINOPHEN 650 MG RE SUPP: 650.0000 mg | Freq: Four times a day (QID) | RECTAL | Status: DC | PRN

## 2019-02-10 MED ORDER — SODIUM CHLORIDE 0.9 % IV SOLN
1.0000 g | INTRAVENOUS | Status: DC
  Administered 2019-02-10 – 2019-02-13 (×4): 1 g via INTRAVENOUS
  Filled 2019-02-10 (×4): qty 10

## 2019-02-10 MED ORDER — DONEPEZIL HCL 5 MG PO TABS
10.0000 mg | ORAL_TABLET | Freq: Every day | ORAL | Status: DC
  Administered 2019-02-11 – 2019-02-13 (×3): 10 mg via ORAL
  Filled 2019-02-10 (×3): qty 2

## 2019-02-10 MED ORDER — MIRABEGRON ER 25 MG PO TB24
50.0000 mg | ORAL_TABLET | Freq: Every day | ORAL | Status: DC
  Administered 2019-02-11 – 2019-02-14 (×4): 50 mg via ORAL
  Filled 2019-02-10 (×4): qty 2

## 2019-02-10 MED ORDER — SODIUM CHLORIDE 0.9 % IV BOLUS
2000.0000 mL | Freq: Once | INTRAVENOUS | Status: AC
  Administered 2019-02-10: 2000 mL via INTRAVENOUS

## 2019-02-10 MED ORDER — ONDANSETRON HCL 4 MG/2ML IJ SOLN: 4.0000 mg | Freq: Four times a day (QID) | INTRAMUSCULAR | Status: DC | PRN

## 2019-02-10 MED ORDER — SODIUM CHLORIDE 0.9 % IV SOLN
100.0000 mg | Freq: Two times a day (BID) | INTRAVENOUS | Status: DC
  Administered 2019-02-11 – 2019-02-13 (×5): 100 mg via INTRAVENOUS
  Filled 2019-02-10 (×7): qty 100

## 2019-02-10 MED ORDER — GUAIFENESIN-DM 100-10 MG/5ML PO SYRP: 10.0000 mL | ORAL_SOLUTION | Freq: Four times a day (QID) | ORAL | Status: DC | PRN

## 2019-02-10 MED ORDER — ACETAMINOPHEN 325 MG PO TABS: 650.0000 mg | ORAL_TABLET | Freq: Four times a day (QID) | ORAL | Status: DC | PRN

## 2019-02-10 MED ORDER — CLOPIDOGREL BISULFATE 75 MG PO TABS
75.0000 mg | ORAL_TABLET | Freq: Every day | ORAL | Status: DC
  Administered 2019-02-11 – 2019-02-14 (×4): 75 mg via ORAL
  Filled 2019-02-10 (×4): qty 1

## 2019-02-10 NOTE — Patient Instructions (Signed)
Pt to ED via EMS for fever, respiratory distress, weakness, and confusion.

## 2019-02-10 NOTE — H&P (Signed)
History and Physical    Suzanne MillinKatherine Wade ZOX:[id number] DOB: Nov 25, 1930 DOA: 02/10/2019  PCP: Sonny Mastersakes, Linda M, FNP   Patient coming from: Home  I have personally briefly reviewed patient's old medical records [facility]  Chief Complaint: SOB, Cough, fever  HPI: [name] is a 85 y.o. female with medical history significant for dementia, hypertension, COPD, CAD, who was brought to the ED with reports of fever, cough and difficulty breathing that started today.  Also increased weakness.  Temperature of 101 was recorded at PCPs office, with tachycardia and tachypnea up to 40. History is obtained from daughter who is at bedside (works in the endoscopy unit here at [employer]), Patient has dementia and is not able to give me details or answer my questions though she is awake and appears alert.  At baseline she talks very little, and needs two-person assistance with ambulation.  ED Course: T max, 99.4.  Mostly 70s to 5280s.  Blood pressure systolic 120s to 811B160s.  Intermittent tachypnea to 30.  O2 sats greater than 93% on room air.  Chest x-ray right basilar atelectasis.  WBC 11.9.  Potassium 2.9.  EKG sinus rhythm, with no significant ST or T wave abnormalities.  SARS COVID 2 test negative.  Due to multiple antibiotic allergies patient was started on IV doxycycline.  Hospitalist to admit for possible pneumonia.  Review of Systems: Unable to obtain due to patient's dementia  Past Medical History:  Diagnosis Date  . Anxiety   . COPD (chronic obstructive pulmonary disease) (HCC)   . Coronary atherosclerosis of native coronary artery    DES LAD and BMS LAD 8/08  . Esophageal stricture   . Essential hypertension, benign   . GERD (gastroesophageal reflux disease)   . MI (myocardial infarction) (HCC) 2008  . Mixed hyperlipidemia   . Osteoporosis   . Pelvic fracture (HCC) 08/2014  . Resting tremor   . Rickets     Past Surgical History:  Procedure Laterality Date  . APPENDECTOMY     . Bilateral cataract surgery    . CARDIAC CATHETERIZATION    . CHOLECYSTECTOMY    . CORONARY ANGIOPLASTY WITH STENT PLACEMENT    . Left Sacroiliac Joint Injection  11/27/11   DR. O'TOOLE  . Left Trochanteric Bursa Injection  11/27/11   DR. O'TOOLE  . VEIN LIGATION AND STRIPPING    . YAG LASER APPLICATION  09/06/2012   Procedure: YAG LASER APPLICATION;  Surgeon: Susa Simmondsarroll F Haines, MD;  Location: [facility];  Service: Ophthalmology;  Laterality: Left;     reports that she has never smoked. She has never used smokeless tobacco. She reports that she does not drink alcohol or use drugs.  Allergies  Allergen Reactions  . Azithromycin   . Cephalexin     Causes her to shake  . Ciprofloxacin Nausea And Vomiting and Other (See Comments)    jittery  . Codeine     REACTION: shakiness  . Contrast Media [Iodinated Diagnostic Agents]   . Latex   . Nitrofuran Derivatives   . Nsaids   . Nutritional Supplements   . Penicillins     REACTION: shakiness  . Phenazopyridine   . Quinolones   . Sulfa Antibiotics   . Toviaz [Fesoterodine Fumarate Er]   . Trimethoprim     Family History  Problem Relation Age of Onset  . Cancer Father        melanoma  . Cancer Sister        breast  .  Tremor Sister   . Hypertension Other   . Thyroid disease Daughter   . Cancer Son        throat  . Obesity Son   . Hypertension Son   . Peripheral Artery Disease Son   . Healthy Son   . Hypertension Daughter   . Depression Daughter   . GER disease Daughter     Prior to Admission medications   Medication Sig Start Date End Date Taking? Authorizing Provider  atorvastatin (LIPITOR) 20 MG tablet Take 1 tablet (20 mg total) by mouth daily. 12/16/18  Yes [name], [name], FNP  Calcium Citrate-Vitamin D (CITRACAL PETITES/VITAMIN D) 200-250 MG-UNIT TABS Take 1 tablet by mouth 2 (two) times daily. 12/16/18  Yes [name], [name], FNP  clopidogrel (PLAVIX) 75 MG tablet Take 1 tablet (75 mg total) by mouth daily. 12/16/18  Yes  [name], [name], FNP  donepezil (ARICEPT) 10 MG tablet Take 1 tablet (10 mg total) by mouth at bedtime. 12/16/18  Yes [name], [name], FNP  furosemide (LASIX) 20 MG tablet Take 1 tablet (20 mg total) by mouth daily. 01/27/19  Yes [name], [name], FNP  isosorbide mononitrate (IMDUR) 30 MG 24 hr tablet Take 1 tablet (30 mg total) by mouth 2 (two) times daily. 12/16/18  Yes [name], [name], FNP  lisinopril (ZESTRIL) 5 MG tablet Take 1 tablet (5 mg total) by mouth daily. 12/16/18  Yes [name], [name], FNP  memantine (NAMENDA) 5 MG tablet Take 1 tablet (5 mg total) by mouth 2 (two) times daily for 30 days. 12/16/18 02/10/19 Yes [name], [name], FNP  mirabegron ER (MYRBETRIQ) 50 MG TB24 tablet Take 1 tablet (50 mg total) by mouth daily. 12/16/18  Yes [name], [name], FNP  nitroGLYCERIN (NITROSTAT) 0.4 MG SL tablet Place 1 tablet (0.4 mg total) under the tongue every 5 (five) minutes as needed. 12/16/18  Yes [name], [name], FNP  nystatin cream (MYCOSTATIN) Apply 1 application topically 2 (two) times daily. 04/28/17  Yes Remus LofflerJones, Angel S, PA-C  ondansetron (ZOFRAN-ODT) 4 MG disintegrating tablet Take 1 tablet (4 mg total) by mouth every 6 (six) hours as needed for nausea or vomiting. 11/08/18  Yes Stacks, Broadus JohnWarren, MD  risperiDONE (RISPERDAL) 0.5 MG tablet TAKE ONE-HALF TO ONE TABLET BY MOUTH TWICE DAILY AS NEEDED (FOR AGITATION) 12/16/18  Yes [name], [name], FNP  zoledronic acid (RECLAST) 5 MG/100ML SOLN injection Inject 5 mg into the vein PRO. Yearly x 5   Yes Hawks, [name] A, FNP  busPIRone (BUSPAR) 15 MG tablet Take 1 tablet (15 mg total) by mouth 2 (two) times daily. Patient not taking: Reported on 02/10/2019 12/16/18   Sonny Mastersakes, Linda M, FNP  risperiDONE (RISPERDAL) 0.25 MG tablet Take one tablet by mouth at noon Patient not taking: Reported on 02/10/2019 12/16/18   Sonny Mastersakes, Linda M, FNP    Physical Exam: Vitals:   02/10/19 2030 02/10/19 2100 02/10/19 2130 02/10/19 2213  BP:  (!) 153/99 (!) 146/88 (!) 151/74  Pulse: 78 76 84  79  Resp: (!) 24 (!) 22 (!) 27   Temp:    98.9 F (37.2 C)  TempSrc:    Oral  SpO2: 94% 98% 98% 95%  Weight:      Height:        Constitutional: NAD, calm, comfortable Vitals:   02/10/19 2030 02/10/19 2100 02/10/19 2130 02/10/19 2213  BP:  (!) 153/99 (!) 146/88 (!) 151/74  Pulse: 78 76 84 79  Resp: (!) 24 (!) 22 (!)  27   Temp:    98.9 F (37.2 C)  TempSrc:    Oral  SpO2: 94% 98% 98% 95%  Weight:      Height:       Eyes: PERRL, lids and conjunctivae normal ENMT: Mucous membranes are moist. Posterior pharynx clear of any exudate or lesions. Neck: normal, supple, no masses, no thyromegaly Respiratory: clear to auscultation bilaterally, no wheezing, no crackles. Normal respiratory effort. No accessory muscle use.  Cardiovascular: Regular rate and rhythm, no murmurs / rubs / gallops. No extremity edema. 2+ pedal pulses. No carotid bruits.  Abdomen: no tenderness, no masses palpated. No hepatosplenomegaly. Bowel sounds positive.  Musculoskeletal: no clubbing / cyanosis. No joint deformity upper and lower extremities. Good ROM, no contractures. Normal muscle tone.  Skin: no rashes, lesions, ulcers. No induration Neurologic: Unable to fully examine due to dementia but patient is moving all her extremities spontaneously. Psychiatric: Weak and alert, barely verbalize, but follows a few directions on exam with the help of her granddaughter .  Labs on Admission: I have personally reviewed following labs and imaging studies  CBC: Recent Labs  Lab 02/10/19 1713  WBC 11.9*  NEUTROABS 9.3*  HGB 11.0*  HCT 35.8*  MCV 98.6  PLT 767   Basic Metabolic Panel: Recent Labs  Lab 02/10/19 1713  NA 142  K 2.9*  CL 103  CO2 28  GLUCOSE 128*  BUN 32*  CREATININE 0.94  CALCIUM 9.0   Liver Function Tests: Recent Labs  Lab 02/10/19 1713  AST 19  ALT 14  ALKPHOS 70  BILITOT 0.9  PROT 6.5  ALBUMIN 3.1*   Coagulation Profile: Recent Labs  Lab 02/10/19 1713  INR 1.0   Urine  analysis:    Component Value Date/Time   COLORURINE YELLOW 02/10/2019 1707   APPEARANCEUR HAZY (A) 02/10/2019 1707   APPEARANCEUR Clear 11/18/2018 1518   LABSPEC 1.015 02/10/2019 1707   PHURINE 5.0 02/10/2019 1707   GLUCOSEU NEGATIVE 02/10/2019 1707   HGBUR SMALL (A) 02/10/2019 1707   BILIRUBINUR NEGATIVE 02/10/2019 1707   BILIRUBINUR Negative 11/18/2018 1518   KETONESUR NEGATIVE 02/10/2019 1707   PROTEINUR NEGATIVE 02/10/2019 1707   UROBILINOGEN negative 11/01/2015 1230   UROBILINOGEN 0.2 12/11/2014 1234   NITRITE NEGATIVE 02/10/2019 1707   LEUKOCYTESUR NEGATIVE 02/10/2019 1707    Radiological Exams on Admission: Dg Chest Port 1 View  Result Date: 02/10/2019 CLINICAL DATA:  Altered mental status EXAM: PORTABLE CHEST 1 VIEW COMPARISON:  05/07/2018 FINDINGS: Right basilar atelectasis. Normal cardiomediastinal contours. No pleural effusion or pneumothorax. IMPRESSION: Right basilar atelectasis. Electronically Signed   By: Ulyses Jarred M.D.   On: 02/10/2019 18:34    EKG: Independently reviewed.  Sinus rhythm.  QTc 435.  Nonspecific ST or T wave abnormalities compared to prior.  Assessment/Plan Active Problems:   Fever, unknown origin  Fever- respiratory symptoms difficulty breathing and cough, with lethargy T max 101 prior to arrival, tachypnea.  Sats > 93% On room air.  Mild leukocytosis 11.9.  Portable chest x-ray right basilar atelectasis. SARS COVID 2 test negative. Rules in for sepsis with normal lactic acid 1.3. 2 Liter bolus sepsis fluids given in ED.  Likely pneumonia, patient appears dehydrated. UA-rare bacteria -Repeat chest x-ray in the morning -Continue IV antibiotics with IV doxycycline, will start IV ceftriaxone (allergy noted- "shaking") for CAP coverage -Follow-up blood and urine cultures -Could benefit from PT evaluation prior to discharge - CBC, BMP a.m -PRN mucolytic's and supplemental O2 - IVF N/s + 40  KCL 75cc/hr x 15 hrs  Hypokalemia-potassium 2.9.  -Replete K, Check magnesium -BMP a.m.  COPD-no wheezing on exam.  Not on home O2. -PRN duo nebs  Dementia-appears patient is close to her baseline but appears lethargic.  At baseline she does not say much, requires two-person assist. -Continue home PRN risperidone, Namenda, donepezil, buspirone. -Consider PT evaluation when able  Coronary artery disease -Continue home Plavix, lipitor   DVT prophylaxis: SCDs- (Lovenox allergy noted) Code Status: Full Family Communication: Granddaughter at bedside Disposition Plan: Per rounding team Consults called: None Admission status: INPatient, tele I certify that at the point of admission it is my clinical judgment that the patient will require inpatient hospital care spanning beyond 2 midnights from the point of admission due to high intensity of service, high risk for further deterioration and high frequency of surveillance required. The following factors support the patient status of inpatient: Fever and respiratory symptoms suggesting pneumonia   Onnie BoerEjiroghene E Emokpae MD [facility]  02/10/2019, 11:00 PM

## 2019-02-10 NOTE — Progress Notes (Signed)
Subjective:  Patient ID: Suzanne MillinKatherine [name], female    DOB: 01-27-1931, 85 y.o.   MRN: [id number]  Chief Complaint:  Fever and frailty   HPI: [name] is a 85 y.o. female presenting on 02/10/2019 for Fever and frailty  Pt presents today for follow up. Pt was treated for leg swelling with lasix 20 mg PO and was to follow up today for repeat CMP. Family states pt has not had a fever at home. She states the pt has not been coughing. States she has been weak for at least 3 weeks and has not been out of the bed. Daughter states her brother usually takes care of the pt. Pt unable to answer questions, pt will open eyes to verbal stimuli. Pt incontinent of urine, has incontinence brief on, urine has foul odor.   Relevant past medical, surgical, family, and social history reviewed and updated as indicated.  Allergies and medications reviewed and updated.   Past Medical History:  Diagnosis Date  . Anxiety   . COPD (chronic obstructive pulmonary disease) (HCC)   . Coronary atherosclerosis of native coronary artery    DES LAD and BMS LAD 8/08  . Esophageal stricture   . Essential hypertension, benign   . GERD (gastroesophageal reflux disease)   . MI (myocardial infarction) (HCC) 2008  . Mixed hyperlipidemia   . Osteoporosis   . Pelvic fracture (HCC) 08/2014  . Resting tremor   . Rickets     Past Surgical History:  Procedure Laterality Date  . APPENDECTOMY    . Bilateral cataract surgery    . CARDIAC CATHETERIZATION    . CHOLECYSTECTOMY    . CORONARY ANGIOPLASTY WITH STENT PLACEMENT    . Left Sacroiliac Joint Injection  11/27/11   DR. O'TOOLE  . Left Trochanteric Bursa Injection  11/27/11   DR. O'TOOLE  . VEIN LIGATION AND STRIPPING    . YAG LASER APPLICATION  09/06/2012   Procedure: YAG LASER APPLICATION;  Surgeon: Susa Simmondsarroll F Haines, MD;  Location: [facility];  Service: Ophthalmology;  Laterality: Left;    Social History   Socioeconomic History  . Marital status: Widowed   Spouse name: Not on file  . Number of children: 5  . Years of education: 11th  . Highest education level: 11th grade  Occupational History    Comment: Housewife  Social Needs  . Financial resource strain: Not hard at all  . Food insecurity    Worry: Never true    Inability: Never true  . Transportation needs    Medical: No    Non-medical: No  Tobacco Use  . Smoking status: Never Smoker  . Smokeless tobacco: Never Used  Substance and Sexual Activity  . Alcohol use: No    Alcohol/week: 0.0 standard drinks  . Drug use: No  . Sexual activity: Not Currently  Lifestyle  . Physical activity    Days per week: 0 days    Minutes per session: 0 min  . Stress: Not at all  Relationships  . Social connections    Talks on phone: More than three times a week    Gets together: More than three times a week    Attends religious service: Never    Active member of club or organization: No    Attends meetings of clubs or organizations: Never    Relationship status: Widowed  . Intimate partner violence    Fear of current or ex partner: No    Emotionally abused: No  Physically abused: No    Forced sexual activity: No  Other Topics Concern  . Not on file  Social History Narrative   Patient has dementia and is dependent on children for care.    Outpatient Encounter Medications as of 02/10/2019  Medication Sig  . atorvastatin (LIPITOR) 20 MG tablet Take 1 tablet (20 mg total) by mouth daily.  . busPIRone (BUSPAR) 15 MG tablet Take 1 tablet (15 mg total) by mouth 2 (two) times daily.  . Calcium Citrate-Vitamin D (CITRACAL PETITES/VITAMIN D) 200-250 MG-UNIT TABS Take 1 tablet by mouth 2 (two) times daily.  . clopidogrel (PLAVIX) 75 MG tablet Take 1 tablet (75 mg total) by mouth daily.  Marland Kitchen. donepezil (ARICEPT) 10 MG tablet Take 1 tablet (10 mg total) by mouth at bedtime.  . furosemide (LASIX) 20 MG tablet Take 1 tablet (20 mg total) by mouth daily.  . isosorbide mononitrate (IMDUR) 30 MG 24 hr  tablet Take 1 tablet (30 mg total) by mouth 2 (two) times daily.  Marland Kitchen. lisinopril (ZESTRIL) 5 MG tablet Take 1 tablet (5 mg total) by mouth daily.  . mirabegron ER (MYRBETRIQ) 50 MG TB24 tablet Take 1 tablet (50 mg total) by mouth daily.  . nitroGLYCERIN (NITROSTAT) 0.4 MG SL tablet Place 1 tablet (0.4 mg total) under the tongue every 5 (five) minutes as needed.  . nystatin cream (MYCOSTATIN) Apply 1 application topically 2 (two) times daily.  . ondansetron (ZOFRAN-ODT) 4 MG disintegrating tablet Take 1 tablet (4 mg total) by mouth every 6 (six) hours as needed for nausea or vomiting.  . risperiDONE (RISPERDAL) 0.25 MG tablet Take one tablet by mouth at noon  . risperiDONE (RISPERDAL) 0.5 MG tablet TAKE ONE-HALF TO ONE TABLET BY MOUTH TWICE DAILY AS NEEDED (FOR AGITATION)  . zoledronic acid (RECLAST) 5 MG/100ML SOLN injection Inject 5 mg into the vein PRO. Yearly x 5  . memantine (NAMENDA) 5 MG tablet Take 1 tablet (5 mg total) by mouth 2 (two) times daily for 30 days.   No facility-administered encounter medications on file as of 02/10/2019.     Allergies  Allergen Reactions  . Azithromycin   . Cephalexin     Causes her to shake  . Ciprofloxacin Nausea And Vomiting and Other (See Comments)    jittery  . Codeine     REACTION: shakiness  . Contrast Media [Iodinated Diagnostic Agents]   . Latex   . Nitrofuran Derivatives   . Nsaids   . Nutritional Supplements   . Penicillins     REACTION: shakiness  . Phenazopyridine   . Quinolones   . Sulfa Antibiotics   . Toviaz [Fesoterodine Fumarate Er]   . Trimethoprim     Review of Systems  Unable to perform ROS: Dementia (ROS per daughter)  Constitutional: Negative for fever.  Respiratory: Negative for cough and shortness of breath.   Cardiovascular: Positive for leg swelling.  Neurological: Positive for weakness.        Objective:  BP (!) 103/53   Pulse 72   Temp (!) 101.4 F (38.6 C) (Oral)   Resp (!) 40   SpO2 93%    Wt  Readings from Last 3 Encounters:  12/21/18 131 lb (59.4 kg)  11/18/18 126 lb (57.2 kg)  11/03/18 129 lb 6 oz (58.7 kg)    Physical Exam Vitals signs and nursing note reviewed.  Constitutional:      General: She is in acute distress.     Appearance: She is ill-appearing and diaphoretic.  HENT:     Head: Normocephalic and atraumatic.     Mouth/Throat:     Mouth: Mucous membranes are dry.  Eyes:     Pupils: Pupils are equal, round, and reactive to light.     Comments: Eyes sunken in  Cardiovascular:     Rate and Rhythm: Normal rate and regular rhythm.     Heart sounds: Normal heart sounds. No murmur. No friction rub. No gallop.   Pulmonary:     Effort: Tachypnea and respiratory distress present.     Breath sounds: Decreased breath sounds and rhonchi present.  Skin:    General: Skin is warm and moist.     Coloration: Skin is pale.  Neurological:     Mental Status: She is easily aroused. She is lethargic and confused.  Psychiatric:        Cognition and Memory: Cognition is impaired. Memory is impaired.     Results for orders placed or performed in visit on 11/18/18  Urine Culture   Specimen: Urine   URINE  Result Value Ref Range   Urine Culture, Routine Final report    Organism ID, Bacteria No growth   Microscopic Examination   URINE  Result Value Ref Range   WBC, UA >30 (A) 0 - 5 /hpf   RBC, UA 0-2 0 - 2 /hpf   Epithelial Cells (non renal) >10 (A) 0 - 10 /hpf   Renal Epithel, UA None seen None seen /hpf   Casts None seen None seen /lpf   Mucus, UA Present Not Estab.   Bacteria, UA Few None seen/Few  Urinalysis, Complete  Result Value Ref Range   Specific Gravity, UA 1.025 1.005 - 1.030   pH, UA 6.5 5.0 - 7.5   Color, UA Yellow Yellow   Appearance Ur Clear Clear   Leukocytes, UA 2+ (A) Negative   Protein, UA 1+ (A) Negative/Trace   Glucose, UA Negative Negative   Ketones, UA Negative Negative   RBC, UA Trace (A) Negative   Bilirubin, UA Negative Negative    Urobilinogen, Ur 0.2 0.2 - 1.0 mg/dL   Nitrite, UA Negative Negative   Microscopic Examination See below:        Pertinent labs & imaging results that were available during my care of the patient were reviewed by me and considered in my medical decision making.  Assessment & Plan:  [name] was seen today for fever and frailty.  Diagnoses and all orders for this visit:  Leg swelling Fever in adult Shortness of breath Weakness Frailty Pt presented to the clinic today with shortness of breath, fever, lethargy, and weakness. Increasing confusion per family. RR 40, temp 101.4. pt to ED via EMS for further evaluation and treatment.      Continue all other maintenance medications.  Follow up plan: Return in about 1 week (around 02/17/2019), or if symptoms worsen or fail to improve.  Pt to ED via [facility]. Report given to charge nurse at [facility] ED.   The above assessment and management plan was discussed with the patient. The patient verbalized understanding of and has agreed to the management plan. Patient is aware to call the clinic if symptoms persist or worsen. Patient is aware when to return to the clinic for a follow-up visit. Patient educated on when it is appropriate to go to the emergency department.   Monia Pouch, FNP-C [facility] [phone]

## 2019-02-10 NOTE — ED Notes (Signed)
Have paged [facility] for Vibramycin

## 2019-02-10 NOTE — ED Triage Notes (Signed)
Pt brought to ED by [facility] for fever and increased RR sent from [place]. Per EMS, pts HR been 70's and once arrived HR now 170's. Pt family reports more confusion since March.

## 2019-02-10 NOTE — ED Notes (Signed)
[name] (Son) called [phone].  He lives out of town.  States, "has a sister that's probably here, but she's very hard of hearing, and wants [place] to make sure she hears and understands what we are saying if we talk to her".  He also states," his sister is a little slow to understand".  Pts Nurse informed of conversation.

## 2019-02-10 NOTE — ED Notes (Signed)
Grand-daughter's information:  Randa Spike  [phone]

## 2019-02-10 NOTE — ED Provider Notes (Signed)
[facility]ENCY DEPARTMENT Provider Note   CSN: [id number] Arrival date & time: 02/10/19  1654     History   Chief Complaint Chief Complaint  Patient presents with  . Fever    HPI Suzanne MillinKatherine [name] is a 85 y.o. female.     Patient has a cough some shortness of breath and fever for a number days.  The history is provided by the patient and a relative. No language interpreter was used.  Fever Max temp prior to arrival:  101.4 Temp source:  Oral Severity:  Moderate Onset quality:  Sudden Timing:  Constant Progression:  Worsening Chronicity:  New Relieved by:  Nothing Worsened by:  Nothing Ineffective treatments:  None tried Associated symptoms: chest pain and cough   Associated symptoms: no congestion, no diarrhea, no headaches and no rash   Risk factors: no contaminated food     Past Medical History:  Diagnosis Date  . Anxiety   . COPD (chronic obstructive pulmonary disease) (HCC)   . Coronary atherosclerosis of native coronary artery    DES LAD and BMS LAD 8/08  . Esophageal stricture   . Essential hypertension, benign   . GERD (gastroesophageal reflux disease)   . MI (myocardial infarction) (HCC) 2008  . Mixed hyperlipidemia   . Osteoporosis   . Pelvic fracture (HCC) 08/2014  . Resting tremor   . Rickets     Patient Active Problem List   Diagnosis Date Noted  . Leg swelling 01/27/2019  . Dementia with behavioral disturbance (HCC) 11/18/2018  . Urinary frequency 05/01/2015  . OAB (overactive bladder) 05/01/2015  . Pelvic fracture (HCC) 03/23/2015  . Osteoporosis 03/21/2015  . At high risk for falls 03/21/2015  . Vitamin D deficiency 11/09/2014  . COPD (chronic obstructive pulmonary disease) (HCC) 01/10/2014  . Urinary incontinence 11/29/2012  . Anxiety 06/06/2012  . Pulmonary nodules 08/19/2011  . Weakness 06/13/2011  . GERD 02/27/2010  . Essential hypertension, benign 07/12/2009  . Hyperlipidemia 07/25/2008  . CORONARY ATHEROSCLEROSIS  NATIVE CORONARY ARTERY 07/25/2008    Past Surgical History:  Procedure Laterality Date  . APPENDECTOMY    . Bilateral cataract surgery    . CARDIAC CATHETERIZATION    . CHOLECYSTECTOMY    . CORONARY ANGIOPLASTY WITH STENT PLACEMENT    . Left Sacroiliac Joint Injection  11/27/11   DR. O'TOOLE  . Left Trochanteric Bursa Injection  11/27/11   DR. O'TOOLE  . VEIN LIGATION AND STRIPPING    . YAG LASER APPLICATION  09/06/2012   Procedure: YAG LASER APPLICATION;  Surgeon: Susa Simmondsarroll F Haines, MD;  Location: [facility];  Service: Ophthalmology;  Laterality: Left;     OB History    Gravida  4   Para  4   Term  4   Preterm      AB      Living  4     SAB      TAB      Ectopic      Multiple      Live Births               Home Medications    Prior to Admission medications   Medication Sig Start Date End Date Taking? Authorizing Provider  atorvastatin (LIPITOR) 20 MG tablet Take 1 tablet (20 mg total) by mouth daily. 12/16/18  Yes [name], [name], FNP  Calcium Citrate-Vitamin D (CITRACAL PETITES/VITAMIN D) 200-250 MG-UNIT TABS Take 1 tablet by mouth 2 (two) times daily. 12/16/18  Yes [name], [name],  FNP  clopidogrel (PLAVIX) 75 MG tablet Take 1 tablet (75 mg total) by mouth daily. 12/16/18  Yes [name], [name], FNP  donepezil (ARICEPT) 10 MG tablet Take 1 tablet (10 mg total) by mouth at bedtime. 12/16/18  Yes [name], [name], FNP  furosemide (LASIX) 20 MG tablet Take 1 tablet (20 mg total) by mouth daily. 01/27/19  Yes [name], [name], FNP  isosorbide mononitrate (IMDUR) 30 MG 24 hr tablet Take 1 tablet (30 mg total) by mouth 2 (two) times daily. 12/16/18  Yes [name], [name], FNP  lisinopril (ZESTRIL) 5 MG tablet Take 1 tablet (5 mg total) by mouth daily. 12/16/18  Yes [name], [name], FNP  memantine (NAMENDA) 5 MG tablet Take 1 tablet (5 mg total) by mouth 2 (two) times daily for 30 days. 12/16/18 02/10/19 Yes [name], [name], FNP  mirabegron ER (MYRBETRIQ) 50 MG TB24 tablet Take 1 tablet (50  mg total) by mouth daily. 12/16/18  Yes [name], [name], FNP  nitroGLYCERIN (NITROSTAT) 0.4 MG SL tablet Place 1 tablet (0.4 mg total) under the tongue every 5 (five) minutes as needed. 12/16/18  Yes [name], [name], FNP  nystatin cream (MYCOSTATIN) Apply 1 application topically 2 (two) times daily. 04/28/17  Yes Remus LofflerJones, Angel S, PA-C  ondansetron (ZOFRAN-ODT) 4 MG disintegrating tablet Take 1 tablet (4 mg total) by mouth every 6 (six) hours as needed for nausea or vomiting. 11/08/18  Yes Stacks, Broadus JohnWarren, MD  risperiDONE (RISPERDAL) 0.5 MG tablet TAKE ONE-HALF TO ONE TABLET BY MOUTH TWICE DAILY AS NEEDED (FOR AGITATION) 12/16/18  Yes [name], [name], FNP  zoledronic acid (RECLAST) 5 MG/100ML SOLN injection Inject 5 mg into the vein PRO. Yearly x 5   Yes Hawks, [name] A, FNP  busPIRone (BUSPAR) 15 MG tablet Take 1 tablet (15 mg total) by mouth 2 (two) times daily. Patient not taking: Reported on 02/10/2019 12/16/18   Sonny Mastersakes, Linda M, FNP  risperiDONE (RISPERDAL) 0.25 MG tablet Take one tablet by mouth at noon Patient not taking: Reported on 02/10/2019 12/16/18   Sonny Mastersakes, Linda M, FNP    Family History Family History  Problem Relation Age of Onset  . Cancer Father        melanoma  . Cancer Sister        breast  . Tremor Sister   . Hypertension Other   . Thyroid disease Daughter   . Cancer Son        throat  . Obesity Son   . Hypertension Son   . Peripheral Artery Disease Son   . Healthy Son   . Hypertension Daughter   . Depression Daughter   . GER disease Daughter     Social History Social History   Tobacco Use  . Smoking status: Never Smoker  . Smokeless tobacco: Never Used  Substance Use Topics  . Alcohol use: No    Alcohol/week: 0.0 standard drinks  . Drug use: No     Allergies   Azithromycin, Cephalexin, Ciprofloxacin, Codeine, Contrast media [iodinated diagnostic agents], Latex, Nitrofuran derivatives, Nsaids, Nutritional supplements, Penicillins, Phenazopyridine, Quinolones, Sulfa  antibiotics, Toviaz [fesoterodine fumarate er], and Trimethoprim   Review of Systems Review of Systems  Constitutional: Positive for fever. Negative for appetite change and fatigue.  HENT: Negative for congestion, ear discharge and sinus pressure.   Eyes: Negative for discharge.  Respiratory: Positive for cough.   Cardiovascular: Positive for chest pain.  Gastrointestinal: Negative for abdominal pain and diarrhea.  Genitourinary: Negative for frequency and  hematuria.  Musculoskeletal: Negative for back pain.  Skin: Negative for rash.  Neurological: Negative for seizures and headaches.  Psychiatric/Behavioral: Negative for hallucinations.     Physical Exam Updated Vital Signs BP (!) 162/121   Pulse 75   Temp 99.3 F (37.4 C) (Rectal)   Resp (!) 30   Ht 5\' 2"  (1.575 m)   Wt 61.5 kg   SpO2 93%   BMI 24.78 kg/m   Physical Exam Vitals signs and nursing note reviewed.  Constitutional:      Appearance: She is well-developed.  HENT:     Head: Normocephalic.     Nose: Nose normal.  Eyes:     Conjunctiva/sclera: Conjunctivae normal.  Neck:     Trachea: No tracheal deviation.  Cardiovascular:     Rate and Rhythm: Normal rate and regular rhythm.     Heart sounds: No murmur.  Abdominal:     General: Bowel sounds are normal.  Musculoskeletal: Normal range of motion.  Skin:    General: Skin is warm.  Neurological:     Mental Status: She is oriented to person, place, and time.  Psychiatric:        Mood and Affect: Mood normal.      ED Treatments / Results  Labs (all labs ordered are listed, but only abnormal results are displayed) Labs Reviewed  COMPREHENSIVE METABOLIC PANEL - Abnormal; Notable for the following components:      Result Value   Potassium 2.9 (*)    Glucose, Bld 128 (*)    BUN 32 (*)    Albumin 3.1 (*)    GFR calc non Af Amer 55 (*)    All other components within normal limits  CBC WITH DIFFERENTIAL/PLATELET - Abnormal; Notable for the following  components:   WBC 11.9 (*)    RBC 3.63 (*)    Hemoglobin 11.0 (*)    HCT 35.8 (*)    Neutro Abs 9.3 (*)    Abs Immature Granulocytes 0.14 (*)    All other components within normal limits  URINALYSIS, ROUTINE W REFLEX MICROSCOPIC - Abnormal; Notable for the following components:   APPearance HAZY (*)    Hgb urine dipstick SMALL (*)    Bacteria, UA RARE (*)    All other components within normal limits  CULTURE, BLOOD (ROUTINE X 2)  CULTURE, BLOOD (ROUTINE X 2)  SARS CORONAVIRUS 2 (HOSPITAL ORDER, PERFORMED IN [place] [facility] LAB)  LACTIC ACID, PLASMA  PROTIME-INR  LACTIC ACID, PLASMA    EKG EKG Interpretation  Date/Time:  Thursday February 10 2019 17:14:14 EDT Ventricular Rate:  79 PR Interval:  120 QRS Duration: 82 QT Interval:  380 QTC Calculation: 435 R Axis:   18 Text Interpretation:  Sinus rhythm with marked sinus arrhythmia Otherwise normal ECG Confirmed by Bethann BerkshireZammit, Kamarii Buren [phone]([id number]) on 02/10/2019 5:19:45 PM   Radiology Dg Chest Port 1 View  Result Date: 02/10/2019 CLINICAL DATA:  Altered mental status EXAM: PORTABLE CHEST 1 VIEW COMPARISON:  05/07/2018 FINDINGS: Right basilar atelectasis. Normal cardiomediastinal contours. No pleural effusion or pneumothorax. IMPRESSION: Right basilar atelectasis. Electronically Signed   By: Deatra RobinsonKevin  Herman M.D.   On: 02/10/2019 18:34    Procedures Procedures (including critical care time)  Medications Ordered in ED Medications  doxycycline (VIBRAMYCIN) 100 mg in sodium chloride 0.9 % 250 mL IVPB (100 mg Intravenous New Bag/Given 02/10/19 1822)  potassium chloride 10 mEq in 100 mL IVPB (10 mEq Intravenous New Bag/Given 02/10/19 1927)  sodium chloride flush (NS) 0.9 %  injection 3 mL (3 mLs Intravenous Given 02/10/19 1753)  sodium chloride 0.9 % bolus 2,000 mL (2,000 mLs Intravenous New Bag/Given 02/10/19 1752)  potassium chloride SA (K-DUR) CR tablet 40 mEq (40 mEq Oral Given 02/10/19 1928)     Initial Impression / Assessment and Plan /  ED Course  I have reviewed the triage vital signs and the nursing notes.  Pertinent labs & imaging results that were available during my care of the patient were reviewed by me and considered in my medical decision making (see chart for details).        Patient with fever shortness of breath possible pneumonia on x-ray.  Patient will be admitted to medicine  Final Clinical Impressions(s) / ED Diagnoses   Final diagnoses:  Community acquired pneumonia, unspecified laterality    ED Discharge Orders    None       Milton Ferguson, MD 02/10/19 2023

## 2019-02-11 ENCOUNTER — Ambulatory Visit (INDEPENDENT_AMBULATORY_CARE_PROVIDER_SITE_OTHER): Payer: Medicare HMO | Admitting: Licensed Clinical Social Worker

## 2019-02-11 ENCOUNTER — Observation Stay (HOSPITAL_COMMUNITY): Payer: Medicare HMO

## 2019-02-11 DIAGNOSIS — J410 Simple chronic bronchitis: Secondary | ICD-10-CM

## 2019-02-11 DIAGNOSIS — J9621 Acute and chronic respiratory failure with hypoxia: Secondary | ICD-10-CM | POA: Diagnosis present

## 2019-02-11 DIAGNOSIS — R0602 Shortness of breath: Secondary | ICD-10-CM | POA: Diagnosis not present

## 2019-02-11 DIAGNOSIS — I10 Essential (primary) hypertension: Secondary | ICD-10-CM | POA: Diagnosis not present

## 2019-02-11 DIAGNOSIS — Z9181 History of falling: Secondary | ICD-10-CM

## 2019-02-11 DIAGNOSIS — R6520 Severe sepsis without septic shock: Secondary | ICD-10-CM

## 2019-02-11 DIAGNOSIS — J189 Pneumonia, unspecified organism: Secondary | ICD-10-CM | POA: Diagnosis present

## 2019-02-11 DIAGNOSIS — F0281 Dementia in other diseases classified elsewhere with behavioral disturbance: Secondary | ICD-10-CM

## 2019-02-11 DIAGNOSIS — A419 Sepsis, unspecified organism: Secondary | ICD-10-CM | POA: Diagnosis present

## 2019-02-11 DIAGNOSIS — J181 Lobar pneumonia, unspecified organism: Secondary | ICD-10-CM

## 2019-02-11 DIAGNOSIS — F0390 Unspecified dementia without behavioral disturbance: Secondary | ICD-10-CM | POA: Diagnosis present

## 2019-02-11 DIAGNOSIS — G3183 Dementia with Lewy bodies: Secondary | ICD-10-CM | POA: Diagnosis not present

## 2019-02-11 DIAGNOSIS — R531 Weakness: Secondary | ICD-10-CM

## 2019-02-11 DIAGNOSIS — R05 Cough: Secondary | ICD-10-CM | POA: Diagnosis not present

## 2019-02-11 DIAGNOSIS — J9601 Acute respiratory failure with hypoxia: Secondary | ICD-10-CM

## 2019-02-11 DIAGNOSIS — F419 Anxiety disorder, unspecified: Secondary | ICD-10-CM

## 2019-02-11 LAB — CBC
HCT: 32.9 % — ABNORMAL LOW (ref 36.0–46.0)
Hemoglobin: 10 g/dL — ABNORMAL LOW (ref 12.0–15.0)
MCH: 30.2 pg (ref 26.0–34.0)
MCHC: 30.4 g/dL (ref 30.0–36.0)
MCV: 99.4 fL (ref 80.0–100.0)
Platelets: 183 10*3/uL (ref 150–400)
RBC: 3.31 MIL/uL — ABNORMAL LOW (ref 3.87–5.11)
RDW: 13.9 % (ref 11.5–15.5)
WBC: 10.7 10*3/uL — ABNORMAL HIGH (ref 4.0–10.5)
nRBC: 0 % (ref 0.0–0.2)

## 2019-02-11 LAB — BASIC METABOLIC PANEL
Anion gap: 8 (ref 5–15)
BUN: 21 mg/dL (ref 8–23)
CO2: 24 mmol/L (ref 22–32)
Calcium: 7.6 mg/dL — ABNORMAL LOW (ref 8.9–10.3)
Chloride: 112 mmol/L — ABNORMAL HIGH (ref 98–111)
Creatinine, Ser: 0.74 mg/dL (ref 0.44–1.00)
GFR calc Af Amer: >60 mL/min (ref >60)
GFR calc non Af Amer: >60 mL/min (ref >60)
Glucose, Bld: 104 mg/dL — ABNORMAL HIGH (ref 70–99)
Potassium: 3.3 mmol/L — ABNORMAL LOW (ref 3.5–5.1)
Sodium: 144 mmol/L (ref 135–145)

## 2019-02-11 MED ORDER — POTASSIUM CHLORIDE CRYS ER 20 MEQ PO TBCR
40.0000 meq | EXTENDED_RELEASE_TABLET | Freq: Once | ORAL | Status: AC
  Administered 2019-02-11: 40 meq via ORAL
  Filled 2019-02-11: qty 2

## 2019-02-11 MED ORDER — ADULT MULTIVITAMIN LIQUID CH
15.0000 mL | Freq: Every day | ORAL | Status: DC
  Administered 2019-02-11 – 2019-02-14 (×4): 15 mL via ORAL
  Filled 2019-02-11 (×4): qty 15

## 2019-02-11 MED ORDER — BOOST / RESOURCE BREEZE PO LIQD CUSTOM
1.0000 | Freq: Two times a day (BID) | ORAL | Status: DC
  Administered 2019-02-12 (×2): 1 via ORAL
  Administered 2019-02-13: 237 mL via ORAL
  Administered 2019-02-13 – 2019-02-14 (×2): 1 via ORAL

## 2019-02-11 MED ORDER — DOXYCYCLINE HYCLATE 100 MG IV SOLR
INTRAVENOUS | Status: AC
  Filled 2019-02-11: qty 100

## 2019-02-11 MED ORDER — PRO-STAT SUGAR FREE PO LIQD
30.0000 mL | Freq: Two times a day (BID) | ORAL | Status: DC
  Administered 2019-02-11 – 2019-02-14 (×6): 30 mL via ORAL
  Filled 2019-02-11 (×5): qty 30

## 2019-02-11 MED ORDER — POTASSIUM CHLORIDE IN NACL 20-0.9 MEQ/L-% IV SOLN
INTRAVENOUS | Status: DC
  Administered 2019-02-11 – 2019-02-13 (×4): via INTRAVENOUS

## 2019-02-11 NOTE — Plan of Care (Signed)
  Problem: Acute Rehab PT Goals(only PT should resolve) Goal: Pt Will Go Supine/Side To Sit Outcome: Progressing Flowsheets (Taken 02/11/2019 1323) Pt will go Supine/Side to Sit:  with minimal assist  with moderate assist Goal: Patient Will Transfer Sit To/From Stand Outcome: Progressing Flowsheets (Taken 02/11/2019 1323) Patient will transfer sit to/from stand:  with min guard assist  with minimal assist Goal: Pt Will Transfer Bed To Chair/Chair To Bed Outcome: Progressing Flowsheets (Taken 02/11/2019 1323) Pt will Transfer Bed to Chair/Chair to Bed: with min assist Goal: Pt Will Ambulate Outcome: Progressing Flowsheets (Taken 02/11/2019 1323) Pt will Ambulate:  25 feet  with moderate assist  with rolling walker   1:27 PM, 02/11/19 Lonell Grandchild, MPT Physical Therapist with [facility] [phone] office [phone] mobile phone

## 2019-02-11 NOTE — Progress Notes (Addendum)
Patient Demographics:    [name], is a 85 y.o. female, DOB - 10-Oct-1930, ZOX:[id number]  Admit date - 02/10/2019   Admitting Physician Ejiroghene Wendall StadeE Ulas Zuercher, MD  Outpatient Primary MD for the patient is [name], [name], FNP  LOS - 0   Chief Complaint  Patient presents with  . Fever        Subjective:    Suzanne MillinKatherine [name] today has no fevers, no emesis,  No chest pain, cough and congestion noted, requiring oxygen,  Assessment  & Plan :    Principal Problem:   Sepsis due to right lower lobe pneumonia Active Problems:   Rt LL CAP (community acquired pneumonia)   Acute on chronic respiratory failure with hypoxia (HCC)   Dementia (HCC)   Essential hypertension, benign   COPD (chronic obstructive pulmonary disease) (HCC)   Fever, unknown origin   Brief Summary:- 85 year old with past medical history relevant for COPD, CAD, HTN and advanced dementia admitted on 02/10/2019 with fevers, leukocytosis, lethargy and respiratory symptoms as well as hypoxia consistent with presumed pneumonia  A/p 1)Sepsis secondary to Rt LL  CAP--- on admission patient had fevers, lethargy/metabolic encephalopathy, hypoxia, tachpnea and increased oxygen requirements, lactic acid was not elevated.  Admission chest x-ray and repeat chest x-ray on 02/11/2019 suggest right lower lobe pneumonia.  COVID negative, continue Rocephin and doxycycline, bronchodilators as ordered  2) acute hypoxic respiratory failure/COPD--- patient with history of COPD, previously was on home O2 ,but discontinued several months ago-continue bronchodilators, treat pneumonia as above #1, patient will need to be qualified for home O2 prior to discharge  3)Generalized Weakness/Debility--- PT recommends SNF rehab POA/son wants discharge home with home health)  4)CAD--stable, asymptomatic, continue atorvastatin and clopidogrel  5) dementia--at baseline  patient has significant cognitive deficits, no significant behavioral problems , continue buspirone 15 mg twice daily, Risperdal 0.25 mg twice daily as needed, Namenda 5 mg twice daily Aricept 10 mg at bedtime  6)Social/Ethics--- plan of care and CODE STATUS discussed with patient's son and POA Mr. Harold HedgeKenneth Cannon, he request DNR/DNI status  Disposition/Need for in-Hospital Stay- patient unable to be discharged at this time due to pneumonia with hypoxic respiratory failure requiring IV antibiotics and supplemental oxygen  Code Status : DNR/DNI  Family Communication:   D/w her son--  Oretha MilchCannon,[name] Son [phone]  [phone]   Disposition Plan  : TBD (PT recommends SNF rehab POA/son wants discharge home with home health)  Consults  :  na  DVT Prophylaxis  :    SCDs (intolerance to heparin)  Lab Results  Component Value Date   PLT 183 02/11/2019   Inpatient Medications  Scheduled Meds: . atorvastatin  20 mg Oral q1800  . busPIRone  15 mg Oral BID  . clopidogrel  75 mg Oral Daily  . donepezil  10 mg Oral QHS  . lisinopril  5 mg Oral Daily  . memantine  5 mg Oral BID  . mirabegron ER  50 mg Oral Daily   Continuous Infusions: . 0.9 % NaCl with KCl 20 mEq / L 40 mL/hr at 02/11/19 1058  . cefTRIAXone (ROCEPHIN)  IV 1 g (02/10/19 2340)  . doxycycline (VIBRAMYCIN) IV 100 mg (02/11/19 0616)   PRN Meds:.acetaminophen **OR** acetaminophen, guaiFENesin-dextromethorphan, ipratropium-albuterol, ondansetron **OR**  ondansetron (ZOFRAN) IV, polyethylene glycol, risperiDONE    Anti-infectives (From admission, onward)   Start     Dose/Rate Route Frequency Ordered Stop   02/11/19 0600  doxycycline (VIBRAMYCIN) 100 mg in sodium chloride 0.9 % 250 mL IVPB     100 mg 125 mL/hr over 120 Minutes Intravenous Every 12 hours 02/10/19 2234     02/10/19 2300  cefTRIAXone (ROCEPHIN) 1 g in sodium chloride 0.9 % 100 mL IVPB     1 g 200 mL/hr over 30 Minutes Intravenous Every 24 hours 02/10/19 2234      02/10/19 1800  doxycycline (VIBRAMYCIN) 100 mg in sodium chloride 0.9 % 250 mL IVPB     100 mg 125 mL/hr over 120 Minutes Intravenous  Once 02/10/19 1729 02/10/19 2040        Objective:   Vitals:   02/10/19 2100 02/10/19 2130 02/10/19 2213 02/11/19 0554  BP: (!) 153/99 (!) 146/88 (!) 151/74 (!) 139/53  Pulse: 76 84 79 61  Resp: (!) 22 (!) 27 (!) 26 (!) 26  Temp:   98.9 F (37.2 C) 98.1 F (36.7 C)  TempSrc:   Oral Oral  SpO2: 98% 98% 95%   Weight:    59.7 kg  Height:    5\' 3"  (1.6 m)    Wt Readings from Last 3 Encounters:  02/11/19 59.7 kg  12/21/18 59.4 kg  11/18/18 57.2 kg    Intake/Output Summary (Last 24 hours) at 02/11/2019 1443 Last data filed at 02/11/2019 1335 Gross per 24 hour  Intake 2769.94 ml  Output -  Net 2769.94 ml   Physical Exam  Gen:- Awake Alert, dyspnea on exertion noted, HEENT:- [place].AT, No sclera icterus Nose- [place] 2 L/min Neck-Supple Neck,No JVD,.  Lungs-diminished in bases right more than left, few scattered rhonchi, no wheezing  CV- S1, S2 normal, regular  Abd-  +ve B.Sounds, Abd Soft, No tenderness,    Extremity/Skin:- No  edema, pedal pulses present  Psych-affect is appropriate, disoriented with cognitive deficits but overall cooperative neuro-generalized weakness, but no new focal deficits, no tremors   Data Review:   Micro Results Recent Results (from the past 240 hour(s))  Culture, blood (Routine x 2)     Status: None (Preliminary result)   Collection Time: 02/10/19  5:13 PM   Specimen: BLOOD LEFT HAND  Result Value Ref Range Status   Specimen Description BLOOD LEFT HAND  Final   Special Requests   Final    BOTTLES DRAWN AEROBIC ONLY Blood Culture adequate volume   Culture   Final    NO GROWTH < 24 HOURS Performed at [facility], [address] [place], [place] [address]    Report Status PENDING  Incomplete  SARS Coronavirus 2 (CEPHEID- Performed in [place] hospital lab), Hosp Order     Status: None   Collection Time:  02/10/19  5:19 PM   Specimen: Nasopharyngeal Swab  Result Value Ref Range Status   SARS Coronavirus 2 NEGATIVE NEGATIVE Final    Comment: (NOTE) If result is NEGATIVE SARS-CoV-2 target nucleic acids are NOT DETECTED. The SARS-CoV-2 RNA is generally detectable in upper and lower  respiratory specimens during the acute phase of infection. The lowest  concentration of SARS-CoV-2 viral copies this assay can detect is 250  copies / mL. A negative result does not preclude SARS-CoV-2 infection  and should not be used as the sole basis for treatment or other  patient management decisions.  A negative result may occur with  improper specimen  collection / handling, submission of specimen other  than nasopharyngeal swab, presence of viral mutation(s) within the  areas targeted by this assay, and inadequate number of viral copies  (<250 copies / mL). A negative result must be combined with clinical  observations, patient history, and epidemiological information. If result is POSITIVE SARS-CoV-2 target nucleic acids are DETECTED. The SARS-CoV-2 RNA is generally detectable in upper and lower  respiratory specimens dur ing the acute phase of infection.  Positive  results are indicative of active infection with SARS-CoV-2.  Clinical  correlation with patient history and other diagnostic information is  necessary to determine patient infection status.  Positive results do  not rule out bacterial infection or co-infection with other viruses. If result is PRESUMPTIVE POSTIVE SARS-CoV-2 nucleic acids MAY BE PRESENT.   A presumptive positive result was obtained on the submitted specimen  and confirmed on repeat testing.  While 2019 novel coronavirus  (SARS-CoV-2) nucleic acids may be present in the submitted sample  additional confirmatory testing may be necessary for epidemiological  and / or clinical management purposes  to differentiate between  SARS-CoV-2 and other Sarbecovirus currently known to  infect humans.  If clinically indicated additional testing with an alternate test  methodology [phone]([id number]) is advised. The SARS-CoV-2 RNA is generally  detectable in upper and lower respiratory sp ecimens during the acute  phase of infection. The expected result is Negative. Fact Sheet for Patients:  BoilerBrush.com.cyhttps://www.fda.gov/media/136312/download Fact Sheet for Healthcare Providers: https://pope.com/https://www.fda.gov/media/136313/download This test is not yet approved or cleared by the [place] FDA and has been authorized for detection and/or diagnosis of SARS-CoV-2 by FDA under an Emergency Use Authorization (EUA).  This EUA will remain in effect (meaning this test can be used) for the duration of the COVID-19 declaration under Section 564(b)(1) of the Act, 21 U.S.C. section 360bbb-3(b)(1), unless the authorization is terminated or revoked sooner. Performed at [facility], [address] [place], [place] [address]   Culture, blood (Routine x 2)     Status: None (Preliminary result)   Collection Time: 02/10/19  5:49 PM   Specimen: Right Antecubital; Blood  Result Value Ref Range Status   Specimen Description RIGHT ANTECUBITAL  Final   Special Requests   Final    BOTTLES DRAWN AEROBIC AND ANAEROBIC Blood Culture adequate volume   Culture   Final    NO GROWTH < 12 HOURS Performed at [name], [address] [place], [place] [address]    Report Status PENDING  Incomplete    Radiology Reports Dg Chest Port 1 View  Result Date: 02/11/2019 CLINICAL DATA:  Shortness of breath.  Cough and fever. EXAM: PORTABLE CHEST 1 VIEW COMPARISON:  One-view chest x-ray 02/10/2019. FINDINGS: The heart size is normal. Lung volumes are low. Right lower lobe airspace opacity is again noted. No other focal consolidation is present. A small right effusion is suspected. IMPRESSION: 1. Asymmetric right lower lobe airspace disease and probable effusion. This is concerning for early infection. Electronically Signed   By:  Marin Robertshristopher  Mattern M.D.   On: 02/11/2019 06:04   Dg Chest Port 1 View  Result Date: 02/10/2019 CLINICAL DATA:  Altered mental status EXAM: PORTABLE CHEST 1 VIEW COMPARISON:  05/07/2018 FINDINGS: Right basilar atelectasis. Normal cardiomediastinal contours. No pleural effusion or pneumothorax. IMPRESSION: Right basilar atelectasis. Electronically Signed   By: Deatra RobinsonKevin  Herman M.D.   On: 02/10/2019 18:34     CBC Recent Labs  Lab 02/10/19 1713 02/11/19 0524  WBC 11.9* 10.7*  HGB 11.0*  10.0*  HCT 35.8* 32.9*  PLT 210 183  MCV 98.6 99.4  MCH 30.3 30.2  MCHC 30.7 30.4  RDW 14.0 13.9  LYMPHSABS 0.9  --   MONOABS 1.0  --   EOSABS 0.5  --   BASOSABS 0.1  --     Chemistries  Recent Labs  Lab 02/10/19 1713 02/11/19 0524  NA 142 144  K 2.9* 3.3*  CL 103 112*  CO2 28 24  GLUCOSE 128* 104*  BUN 32* 21  CREATININE 0.94 0.74  CALCIUM 9.0 7.6*  MG 2.2  --   AST 19  --   ALT 14  --   ALKPHOS 70  --   BILITOT 0.9  --    ------------------------------------------------------------------------------------------------------------------ No results for input(s): CHOL, HDL, LDLCALC, TRIG, CHOLHDL, LDLDIRECT in the last 72 hours.  Lab Results  Component Value Date   HGBA1C 5.8 (H) 06/06/2012   ------------------------------------------------------------------------------------------------------------------ No results for input(s): TSH, T4TOTAL, T3FREE, THYROIDAB in the last 72 hours.  Invalid input(s): FREET3 ------------------------------------------------------------------------------------------------------------------ No results for input(s): VITAMINB12, FOLATE, FERRITIN, TIBC, IRON, RETICCTPCT in the last 72 hours.  Coagulation profile Recent Labs  Lab 02/10/19 1713  INR 1.0    No results for input(s): DDIMER in the last 72 hours.  Cardiac Enzymes No results for input(s): CKMB, TROPONINI, MYOGLOBIN in the last 168 hours.  Invalid input(s): CK  ------------------------------------------------------------------------------------------------------------------ No results found for: BNP   Shon Haleourage Anatalia Kronk M.D on 02/11/2019 at 2:43 PM  Go to [email or web address] - for contact info  [facility] - Office  [phone]

## 2019-02-11 NOTE — Patient Instructions (Signed)
Licensed Clinical Social Worker Visit Information  Goals we discussed today:  Goals Addressed            This Visit's Progress   . Client to communicate with LCSW in next 30 days to discuss health needs management of client (pt-stated)       Current Barriers:  . ADL IADL limitations  . Weakness, fall potential . Needs help with ADLs  Clinical Social Work Clinical Goal(s):  Marland Kitchen Over the next 30 days, [name], son of client  will work with LCSW to address concerns related to health needs management for client  . Interventions: . Discussed health care needs of client . Encouraged client or son to talk with RN CM to discuss nursing needs of client . Encouraged client/son to communicate as needed with Baruch Gouty, FNP regarding medical needs of client . Encouraged client/son of client to call LCSW as needed to talk about psychosocial needs of client.   Patient Self Care Activities:  . Attends all scheduled provider appointments  Plan:   Client to attend scheduled medical appointments Client/son, [name], to communicate with RN CM as needed to discuss nursing needs of client LCSW to call client/son, [name], in next 3 weeks to discuss with client/son the management of health needs of client  Initial goal documentation      Materials Provided: No  Follow Up Plan: LCSW to call client/son, [name], in next 3 weeks to discuss with client/son the management of health needs of client.  The patient/son, [name], verbalized understanding of instructions provided today and declined a print copy of patient instruction materials.   Norva Riffle.Tarence Searcy MSW, LCSW Licensed Clinical Social Worker [facility] Care Management [phone]

## 2019-02-11 NOTE — Chronic Care Management (AMB) (Signed)
  Chronic Care Management    Clinical Social Work CCM Outreach Note  02/11/2019 Name: [name] MRN: [id number] DOB: [birth year]  [name] is a 85 y.o. year old female who is a primary care patient of [name], [name], FNP . The CCM team was consulted for assistance with assessment of psychosocial needs.   LCSW spoke via phone with [name] today    Chronic Care Management from 12/24/2018 in [place]  PHQ-9 Total Score  10     Patient Stated Goal: client wants to talk with LCSW in next 30 days about health management needs of client  Current Barriers:  . ADL IADL limitations  . Weakness, fall potential . Needs help with ADLs  Clinical Social Work Clinical Goal(s):  Marland Kitchen Over the next 30 days, [name], son of client  will work with LCSW to address concerns related to health needs management for client  . Interventions: . Discussed health care needs of client . Encouraged client or son to talk with RN CM to discuss nursing needs of client . Encouraged client/son to communicate as needed with Baruch Gouty, FNP regarding medical needs of client . Encouraged client/son of client to call LCSW as needed to talk about psychosocial needs of client.   Patient Self Care Activities:  . Attends all scheduled provider appointments  Plan:   Client to attend scheduled medical appointments Client/son, [name], to communicate with RN CM as needed to discuss nursing needs of client LCSW to call client/son, [name], in next 3 weeks to discuss with client/son the management of health needs of client  Initial goal documentation  [name], son of client, reported to LCSW that client was admitted to [facility] recently with complaints of fever, cough, breathing issues . Client has received physical therapy sessions at the hospital and client requests to go home when able with needed supports in place. [name] said he had talked with representative  at the hospital about setting up HHPT and support for client when discharged. [name] said he hopes client will discharge home in next few days..Client has hospital bed at home to help her with sleep. Client did not want to go to SNF facility but chooses to go home, when able,  with home health services in place  Follow Up Plan: LCSW to call client/son, [name], in next 3 weeks to discuss with client/son the management of health needs of client  Norva Riffle.Taleyah Hillman MSW, LCSW Licensed Clinical Social Worker [facility] Care [facility] [phone]

## 2019-02-11 NOTE — Progress Notes (Signed)
Initial Nutrition Assessment  DOCUMENTATION CODES:   Not applicable  INTERVENTION: Boost Breeze po BID, each supplement provides 250 kcal and 9 grams of protein  Will order 30 mL Prostat BID, each supplement provides 100 kcal and 15 grams of protein.  Liquid MVI with min.   Appreciate Nursing's assistance w/ feeding at meals.   NUTRITION DIAGNOSIS:  Increased nutrient needs related to wound healing as evidenced by estimated nutrition requirements for this outcome  GOAL:  Patient will meet greater than or equal to 90% of their needs  MONITOR:  PO intake, Weight trends, Supplement acceptance, Diet advancement, I & O's, Labs, Skin  REASON FOR ASSESSMENT:  Malnutrition Screening Tool    ASSESSMENT:  85 y/o female PMHx end-stage dementia, HTN, HLD, COPD, CAD, anxiety, MI. Presented to PCP w/ report of fever, cough, weakness and SOB x1 day. Directed to ED after found to be tachycardic and tachypneic w/ Temp of 101. In ED, CXR shows R basilar atelectasis. Met sepsis criteria with likely pna.   On RD arrival, pt confused. Oriented x0. Unable to provide any information.   Per staff, pt ate "2 bites" for breakfast this morning and only a couple bites and sips at lunch as well. She has largely been refusing to open mouth to take oral intake, however nursing notes occasional moments of clarity where patient can answer question and follow commands.   Weigh wise, pt currently weighs 131.6 lbs. Per chart, pts weight has been quite stable. She has been 130-135 lbs for the past 1.5 years. This suggests pt has been receiving adequate nutrition support in her current living environment.   Pt reportedly did not accept much of Ensure Enlive. Will try Boost Breeze, which tends to have higher acceptance rate in dementia population d/t sweetness. Will also add prostat BID due to pressure ulcer. Appreciate nursing assistance w/ feeding.   Labs: K:2.9->3.3. Bgs 104-128, WBC:10.7, Albumin: 3.1  Meds:  Namenda, Aricept, IVF, IV abx  Recent Labs  Lab 02/10/19 1713 02/11/19 0524  NA 142 144  K 2.9* 3.3*  CL 103 112*  CO2 28 24  BUN 32* 21  CREATININE 0.94 0.74  CALCIUM 9.0 7.6*  MG 2.2  --   GLUCOSE 128* 104*   NUTRITION - FOCUSED PHYSICAL EXAM: Deferred   Diet Order:   Diet Order            Diet Heart Room service appropriate? Yes; Fluid consistency: Thin  Diet effective now             EDUCATION NEEDS:  No education needs have been identified at this time  Skin:   PU stage II to sacrum  Last BM:  Unknown  Height:  Ht Readings from Last 1 Encounters:  02/11/19 _0  (1.6 m)   Weight:  Wt Readings from Last 1 Encounters:  02/11/19 59.7 kg   Wt Readings from Last 10 Encounters:  02/11/19 59.7 kg  12/21/18 59.4 kg  11/18/18 57.2 kg  11/03/18 58.7 kg  09/15/18 59.4 kg  05/25/18 60.5 kg  05/10/18 60.9 kg  04/16/18 61.4 kg  03/26/18 60.1 kg  03/12/18 59.4 kg   Ideal Body Weight:  52.27 kg  BMI:  Body mass index is 23.31 kg/m.  Estimated Nutritional Needs:  Kcal:  1500-1650 kcals (25-28 kcal/kg bw) Protein:  77-90g Pro (1.3-1.5g/kg bw) Fluid:  >1.5 L fluid (25 ml/kg bw)  Burtis Junes RD, LDN, CNSC Clinical Nutrition Available Tues-Sat via Pager: [phone] 02/11/2019 5:21 PM

## 2019-02-11 NOTE — Care Management Obs Status (Signed)
[place] NOTIFICATION   Patient Details  Name: Perline Awe MRN: [id number] Date of Birth: 03/01/1931   Medicare Observation Status Notification Given:  Other (see comment)(left a message on son's voicemail to return my call)    Tommy Medal 02/11/2019, 2:57 PM

## 2019-02-11 NOTE — Evaluation (Signed)
Physical Therapy Evaluation Patient Details Name: [name] MRN: [id number] DOB: 02/05/1931 Today's Date: 02/11/2019   History of Present Illness  [name] is a 85 y.o. female with medical history significant for dementia, hypertension, COPD, CAD, who was brought to the ED with reports of fever, cough and difficulty breathing that started today.  Also increased weakness.  Temperature of 101 was recorded at PCPs office, with tachycardia and tachypnea up to 40.History is obtained from daughter who is at bedside (works in the endoscopy unit here at [employer]), Patient has dementia and is not able to give me details or answer my questions though she is awake and appears alert.  At baseline she talks very little, and needs two-person assistance with ambulation.    Clinical Impression  Patient requires much assistance for sitting up at bedside with limited use of BUE due to generalized weakness, able to follow directions for completing functional activities, but non verbal most of time, limited to a few unsteady labored side steps with near loss of balance due to BLE using RW and tolerated sitting up in chair after therapy - RN/NT notified.  Patient will benefit from continued physical therapy in hospital and recommended venue below to increase strength, balance, endurance for safe ADLs and gait.    Follow Up Recommendations SNF;Supervision for mobility/OOB;Supervision/Assistance - 24 hour    Equipment Recommendations  Rolling walker with 5" wheels    Recommendations for Other Services       Precautions / Restrictions Precautions Precautions: Fall Restrictions Weight Bearing Restrictions: No      Mobility  Bed Mobility Overal bed mobility: Needs Assistance Bed Mobility: Supine to Sit     Supine to sit: Max assist        Transfers Overall transfer level: Needs assistance Equipment used: Rolling walker (2 wheeled) Transfers: Sit to/from [facility] Sit  to Stand: Mod assist;Max assist Stand pivot transfers: Max assist       General transfer comment: very unsteady due to BLE weakness  Ambulation/Gait Ambulation/Gait assistance: Max assist Gait Distance (Feet): 4 Feet Assistive device: Rolling walker (2 wheeled) Gait Pattern/deviations: Decreased step length - right;Decreased step length - left;Decreased stride length Gait velocity: slow   General Gait Details: limited to 4-5 very unsteady labored slow steps due to BLE weakness with poor standing balance  Stairs            Wheelchair Mobility    Modified Rankin (Stroke Patients Only)       Balance Overall balance assessment: Needs assistance Sitting-balance support: Feet supported;Bilateral upper extremity supported Sitting balance-Leahy Scale: Fair     Standing balance support: Bilateral upper extremity supported;During functional activity Standing balance-Leahy Scale: Poor Standing balance comment: using RW                             Pertinent Vitals/Pain Pain Assessment: Faces Faces Pain Scale: No hurt    Home Living Family/patient expects to be discharged to:: Private residence Living Arrangements: Children               Additional Comments: Patient is a poor historian    Prior Function Level of Independence: Needs assistance      ADL's / Homemaking Assistance Needed: asssisted by family        Hand Dominance        Extremity/Trunk Assessment   Upper Extremity Assessment Upper Extremity Assessment: Generalized weakness    Lower Extremity Assessment Lower  Extremity Assessment: Generalized weakness    Cervical / Trunk Assessment Cervical / Trunk Assessment: Kyphotic  Communication   Communication: Other (comment)(patient mostly non-verbal)  Cognition Arousal/Alertness: Awake/alert Behavior During Therapy: Flat affect Overall Cognitive Status: History of cognitive impairments - at baseline                                  General Comments: mostly nonverbal, but able to follow directions with Max verbal/tactile cueing      General Comments      Exercises     Assessment/Plan    PT Assessment Patient needs continued PT services  PT Problem List Decreased strength;Decreased activity tolerance;Decreased balance;Decreased mobility       PT Treatment Interventions Gait training;Stair training;Functional mobility training;Therapeutic activities;Therapeutic exercise;Patient/family education    PT Goals (Current goals can be found in the Care Plan section)  Acute Rehab PT Goals Patient Stated Goal: not stated PT Goal Formulation: With patient Time For Goal Achievement: 02/25/19 Potential to Achieve Goals: Fair    Frequency Min 3X/week   Barriers to discharge        Co-evaluation               AM-PAC PT "6 Clicks" Mobility  Outcome Measure Help needed turning from your back to your side while in a flat bed without using bedrails?: A Lot Help needed moving from lying on your back to sitting on the side of a flat bed without using bedrails?: A Lot Help needed moving to and from a bed to a chair (including a wheelchair)?: A Lot Help needed standing up from a chair using your arms (e.g., wheelchair or bedside chair)?: A Lot Help needed to walk in hospital room?: A Lot Help needed climbing 3-5 steps with a railing? : Total 6 Click Score: 11    End of Session Equipment Utilized During Treatment: Oxygen Activity Tolerance: Patient limited by fatigue;Patient tolerated treatment well Patient left: in chair;with call bell/phone within reach;with chair alarm set Nurse Communication: Mobility status PT Visit Diagnosis: Unsteadiness on feet (R26.81);Other abnormalities of gait and mobility (R26.89);Muscle weakness (generalized) (M62.81)    Time: [id number] PT Time Calculation (min) (ACUTE ONLY): 31 min   Charges:   PT Evaluation $PT Eval Moderate Complexity: 1 Mod PT  Treatments $Therapeutic Activity: 23-37 mins        1:21 PM, 02/11/19 Ocie BobJames Juanell Saffo, MPT Physical Therapist with [facility] [phone] office [phone] mobile phone

## 2019-02-11 NOTE — TOC Initial Note (Addendum)
Transition of Care [facility](TOC) - Initial/Assessment Note    Patient Details  Name: Suzanne MillinKatherine [name] MRN: [id number] Date of Birth: 07/07/1931  Transition of Care [facility](TOC) CM/SW Contact:    Annice NeedySettle, Kiannah Grunow D, LCSW Phone Number: 02/11/2019, 2:59 PM  Clinical Narrative:                 Patient is from home with son, [name]. Her daughter assists with care in the home during the day when [name] is working. Discussed PT recommendations. [name] is not interested in SNF due to coronovirus fears.  [facility] Preferences provided.  Referral made.  Patient has DME in the home consisting of walker (she uses), shower chair and bedside commode.  Patient is not currently on oxygen at home. [name] indicates that she was on home oxygen in the past however her PCP discontinued it some time ago.  Attending notified that [facility] of [facility] choice.   Expected Discharge Plan: Home w Home Health Services Barriers to Discharge: No Barriers Identified   Patient Goals and CMS Choice   CMS Medicare.gov Compare Post Acute Care list provided to:: Other (Comment Required)(Son) Choice offered to / list presented to : Adult Children  Expected Discharge Plan and Services Expected Discharge Plan: Home w Home Health Services       Living arrangements for the past 2 months: Single Family Home                           [facility] Date HH Agency Contacted: 02/11/19 Time HH Agency Contacted: 1459 [name]  Prior Living Arrangements/Services Living arrangements for the past 2 months: Single Family Home Lives with:: Adult Children Patient language and need for interpreter reviewed:: Yes Do you feel safe going back to the place where you live?: Yes      Need for Family Participation in Patient Care: Yes (Comment) Care giver support system in place?: Yes (comment) Current home services:  DME Criminal Activity/Legal Involvement Pertinent to Current Situation/Hospitalization: No - Comment as needed  Activities of Daily Living Home Assistive Devices/Equipment: Environmental consultantWalker (specify type), Cane (specify quad or straight), Other (Comment)(chair that raises) ADL Screening (condition at time of admission) Patient's cognitive ability adequate to safely complete daily activities?: No Is the patient deaf or have difficulty hearing?: Yes Does the patient have difficulty seeing, even when wearing glasses/contacts?: No Does the patient have difficulty concentrating, remembering, or making decisions?: Yes Patient able to express need for assistance with ADLs?: No Does the patient have difficulty dressing or bathing?: Yes Independently performs ADLs?: No Communication: Independent Dressing (OT): Needs assistance Is this a change from baseline?: Pre-admission baseline Grooming: Needs assistance Is this a change from baseline?: Pre-admission baseline Feeding: Needs assistance Is this a change from baseline?: Pre-admission baseline Toileting: Needs assistance Is this a change from baseline?: Pre-admission baseline In/Out Bed: Needs assistance Is this a change from baseline?: Pre-admission baseline Walks in Home: Needs assistance Is this a change from baseline?: Pre-admission baseline Does the patient have difficulty walking or climbing stairs?: Yes Weakness of Legs: Both Weakness of Arms/Hands: Both  Permission Sought/Granted Permission sought to share information with : Family Supports          Permission granted to share info w Relationship: [name], son     Emotional Assessment Appearance:: Appears stated age   Affect (typically observed): Unable to Assess Orientation: :  Oriented to Self Alcohol / Substance Use: Not Applicable Psych Involvement: No (comment)  Admission diagnosis:  Fever [R50.9] Community acquired pneumonia, unspecified laterality [J18.9] Patient Active  Problem List   Diagnosis Date Noted  . Rt LL CAP (community acquired pneumonia) 02/11/2019  . Dementia ([place]) 02/11/2019  . Acute on chronic respiratory failure with hypoxia ([place]) 02/11/2019  . Sepsis due to right lower lobe pneumonia 02/11/2019  . Fever, unknown origin 02/10/2019  . Leg swelling 01/27/2019  . Urinary frequency 05/01/2015  . OAB (overactive bladder) 05/01/2015  . Pelvic fracture ([place]) 03/23/2015  . Osteoporosis 03/21/2015  . At high risk for falls 03/21/2015  . Vitamin D deficiency 11/09/2014  . COPD (chronic obstructive pulmonary disease) ([place]) 01/10/2014  . Urinary incontinence 11/29/2012  . Anxiety 06/06/2012  . Pulmonary nodules 08/19/2011  . Weakness 06/13/2011  . GERD 02/27/2010  . Essential hypertension, benign 07/12/2009  . Hyperlipidemia 07/25/2008  . CORONARY ATHEROSCLEROSIS NATIVE CORONARY ARTERY 07/25/2008   PCP:  Baruch Gouty, FNP Pharmacy:   [facility], [place] [address] [place] [address] Phone: [phone] Fax: [phone]     Social Determinants of Health (SDOH) Interventions    Readmission Risk Interventions No flowsheet data found.

## 2019-02-12 DIAGNOSIS — I361 Nonrheumatic tricuspid (valve) insufficiency: Secondary | ICD-10-CM | POA: Diagnosis not present

## 2019-02-12 DIAGNOSIS — Z8249 Family history of ischemic heart disease and other diseases of the circulatory system: Secondary | ICD-10-CM | POA: Diagnosis not present

## 2019-02-12 DIAGNOSIS — Z885 Allergy status to narcotic agent status: Secondary | ICD-10-CM | POA: Diagnosis not present

## 2019-02-12 DIAGNOSIS — Z818 Family history of other mental and behavioral disorders: Secondary | ICD-10-CM | POA: Diagnosis not present

## 2019-02-12 DIAGNOSIS — Z881 Allergy status to other antibiotic agents status: Secondary | ICD-10-CM | POA: Diagnosis not present

## 2019-02-12 DIAGNOSIS — I252 Old myocardial infarction: Secondary | ICD-10-CM | POA: Diagnosis not present

## 2019-02-12 DIAGNOSIS — I340 Nonrheumatic mitral (valve) insufficiency: Secondary | ICD-10-CM | POA: Diagnosis not present

## 2019-02-12 DIAGNOSIS — K219 Gastro-esophageal reflux disease without esophagitis: Secondary | ICD-10-CM | POA: Diagnosis present

## 2019-02-12 DIAGNOSIS — F0390 Unspecified dementia without behavioral disturbance: Secondary | ICD-10-CM | POA: Diagnosis present

## 2019-02-12 DIAGNOSIS — J9601 Acute respiratory failure with hypoxia: Secondary | ICD-10-CM | POA: Diagnosis not present

## 2019-02-12 DIAGNOSIS — G9341 Metabolic encephalopathy: Secondary | ICD-10-CM | POA: Diagnosis present

## 2019-02-12 DIAGNOSIS — E876 Hypokalemia: Secondary | ICD-10-CM | POA: Diagnosis present

## 2019-02-12 DIAGNOSIS — J9621 Acute and chronic respiratory failure with hypoxia: Secondary | ICD-10-CM | POA: Diagnosis present

## 2019-02-12 DIAGNOSIS — M810 Age-related osteoporosis without current pathological fracture: Secondary | ICD-10-CM | POA: Diagnosis present

## 2019-02-12 DIAGNOSIS — Z20828 Contact with and (suspected) exposure to other viral communicable diseases: Secondary | ICD-10-CM | POA: Diagnosis present

## 2019-02-12 DIAGNOSIS — Z7902 Long term (current) use of antithrombotics/antiplatelets: Secondary | ICD-10-CM | POA: Diagnosis not present

## 2019-02-12 DIAGNOSIS — I2510 Atherosclerotic heart disease of native coronary artery without angina pectoris: Secondary | ICD-10-CM | POA: Diagnosis present

## 2019-02-12 DIAGNOSIS — E782 Mixed hyperlipidemia: Secondary | ICD-10-CM | POA: Diagnosis present

## 2019-02-12 DIAGNOSIS — J189 Pneumonia, unspecified organism: Secondary | ICD-10-CM | POA: Diagnosis present

## 2019-02-12 DIAGNOSIS — R509 Fever, unspecified: Secondary | ICD-10-CM | POA: Diagnosis present

## 2019-02-12 DIAGNOSIS — J440 Chronic obstructive pulmonary disease with acute lower respiratory infection: Secondary | ICD-10-CM | POA: Diagnosis present

## 2019-02-12 DIAGNOSIS — A419 Sepsis, unspecified organism: Secondary | ICD-10-CM | POA: Diagnosis present

## 2019-02-12 DIAGNOSIS — I10 Essential (primary) hypertension: Secondary | ICD-10-CM | POA: Diagnosis present

## 2019-02-12 DIAGNOSIS — Z66 Do not resuscitate: Secondary | ICD-10-CM | POA: Diagnosis present

## 2019-02-12 DIAGNOSIS — R6520 Severe sepsis without septic shock: Secondary | ICD-10-CM | POA: Diagnosis not present

## 2019-02-12 DIAGNOSIS — Z79899 Other long term (current) drug therapy: Secondary | ICD-10-CM | POA: Diagnosis not present

## 2019-02-12 DIAGNOSIS — J9811 Atelectasis: Secondary | ICD-10-CM | POA: Diagnosis present

## 2019-02-12 DIAGNOSIS — J181 Lobar pneumonia, unspecified organism: Secondary | ICD-10-CM | POA: Diagnosis not present

## 2019-02-12 DIAGNOSIS — Z882 Allergy status to sulfonamides status: Secondary | ICD-10-CM | POA: Diagnosis not present

## 2019-02-12 LAB — URINE CULTURE: Culture: NO GROWTH

## 2019-02-12 LAB — CBC
HCT: 34.5 % — ABNORMAL LOW (ref 36.0–46.0)
Hemoglobin: 10.8 g/dL — ABNORMAL LOW (ref 12.0–15.0)
MCH: 30.6 pg (ref 26.0–34.0)
MCHC: 31.3 g/dL (ref 30.0–36.0)
MCV: 97.7 fL (ref 80.0–100.0)
Platelets: 224 10*3/uL (ref 150–400)
RBC: 3.53 MIL/uL — ABNORMAL LOW (ref 3.87–5.11)
RDW: 14.1 % (ref 11.5–15.5)
WBC: 12 10*3/uL — ABNORMAL HIGH (ref 4.0–10.5)
nRBC: 0 % (ref 0.0–0.2)

## 2019-02-12 LAB — BASIC METABOLIC PANEL
Anion gap: 7 (ref 5–15)
BUN: 12 mg/dL (ref 8–23)
CO2: 24 mmol/L (ref 22–32)
Calcium: 7.4 mg/dL — ABNORMAL LOW (ref 8.9–10.3)
Chloride: 114 mmol/L — ABNORMAL HIGH (ref 98–111)
Creatinine, Ser: 0.64 mg/dL (ref 0.44–1.00)
GFR calc Af Amer: >60 mL/min (ref >60)
GFR calc non Af Amer: >60 mL/min (ref >60)
Glucose, Bld: 111 mg/dL — ABNORMAL HIGH (ref 70–99)
Potassium: 4.1 mmol/L (ref 3.5–5.1)
Sodium: 145 mmol/L (ref 135–145)

## 2019-02-12 MED ORDER — LISINOPRIL 10 MG PO TABS
10.0000 mg | ORAL_TABLET | Freq: Every day | ORAL | Status: DC
  Administered 2019-02-12 – 2019-02-14 (×3): 10 mg via ORAL
  Filled 2019-02-12 (×3): qty 1

## 2019-02-12 MED ORDER — AMLODIPINE BESYLATE 5 MG PO TABS: 5.0000 mg | ORAL_TABLET | Freq: Every day | ORAL | Status: DC

## 2019-02-12 MED ORDER — METOPROLOL TARTRATE 5 MG/5ML IV SOLN
INTRAVENOUS | Status: AC
  Filled 2019-02-12: qty 5

## 2019-02-12 MED ORDER — BISACODYL 10 MG RE SUPP
10.0000 mg | Freq: Once | RECTAL | Status: AC
  Administered 2019-02-12: 10 mg via RECTAL
  Filled 2019-02-12: qty 1

## 2019-02-12 MED ORDER — MAGNESIUM SULFATE 2 GM/50ML IV SOLN
2.0000 g | Freq: Once | INTRAVENOUS | Status: AC
  Administered 2019-02-12: 2 g via INTRAVENOUS
  Filled 2019-02-12: qty 50

## 2019-02-12 MED ORDER — POTASSIUM CHLORIDE CRYS ER 20 MEQ PO TBCR: 40.0000 meq | EXTENDED_RELEASE_TABLET | Freq: Once | ORAL | Status: DC

## 2019-02-12 MED ORDER — METOPROLOL TARTRATE 5 MG/5ML IV SOLN
5.0000 mg | Freq: Once | INTRAVENOUS | Status: AC
  Administered 2019-02-12: 5 mg via INTRAVENOUS

## 2019-02-12 MED ORDER — SODIUM CHLORIDE 0.9 % IV BOLUS
500.0000 mL | Freq: Once | INTRAVENOUS | Status: AC
  Administered 2019-02-12: 03:00:00 500 mL via INTRAVENOUS

## 2019-02-12 MED ORDER — DILTIAZEM HCL 25 MG/5ML IV SOLN: 10.0000 mg | Freq: Once | INTRAVENOUS | Status: AC

## 2019-02-12 MED ORDER — CARVEDILOL 3.125 MG PO TABS
6.2500 mg | ORAL_TABLET | Freq: Two times a day (BID) | ORAL | Status: DC
  Administered 2019-02-12 (×2): 6.25 mg via ORAL
  Filled 2019-02-12 (×2): qty 2

## 2019-02-12 MED ORDER — POTASSIUM CHLORIDE CRYS ER 20 MEQ PO TBCR
20.0000 meq | EXTENDED_RELEASE_TABLET | Freq: Once | ORAL | Status: AC
  Administered 2019-02-12: 20 meq via ORAL
  Filled 2019-02-12: qty 1

## 2019-02-12 MED ORDER — AMLODIPINE BESYLATE 5 MG PO TABS
10.0000 mg | ORAL_TABLET | Freq: Every day | ORAL | Status: DC
  Administered 2019-02-12: 10 mg via ORAL
  Filled 2019-02-12: qty 2

## 2019-02-12 MED ORDER — METOPROLOL TARTRATE 5 MG/5ML IV SOLN
2.5000 mg | Freq: Once | INTRAVENOUS | Status: AC
  Administered 2019-02-12: 2.5 mg via INTRAVENOUS
  Filled 2019-02-12: qty 5

## 2019-02-12 MED ORDER — DILTIAZEM HCL 25 MG/5ML IV SOLN
INTRAVENOUS | Status: AC
  Administered 2019-02-12: 10 mg
  Filled 2019-02-12: qty 5

## 2019-02-12 NOTE — Progress Notes (Signed)
0105: I noted pt was in SVT on the monitor.  Dr. Olevia Bowens notified. When Dr. Olevia Bowens arrived on the floor, SVT had broken and pt was in NSR.  Orders given.   6195: I noticed pt was in SVT again.  EKG done at this time to confirm.  Dr. Olevia Bowens notified and gave order to give 5mg  labetolol.  Medication given.  Patient returned to a sinus brady/sinus rhythm, but it was irregular at times.    0615: Dr. Olevia Bowens notified that patient had >550 in her bladder.  No new orders at this time.

## 2019-02-12 NOTE — Progress Notes (Addendum)
Patient Demographics:    [name], is a 85 y.o. female, DOB - 12-26-1930, FIE:[id number]  Admit date - 02/10/2019   Admitting Physician Ejiroghene Arlyce Dice, MD  Outpatient Primary MD for the patient is [name], [name], [place]  LOS - 0   Chief Complaint  Patient presents with  . Fever        Subjective:    [name] today has no fevers, no emesis,  No chest pain, cough and congestion noted, requiring oxygen, remains pleasantly confused,... Voiding difficulties persist, required another in and out catheterization overnight,  Also had episode of tachyarrythmia overnight.... required iv labetalol  Assessment  & Plan :    Principal Problem:   Sepsis due to right lower lobe pneumonia Active Problems:   Rt LL CAP (community acquired pneumonia)   Acute on chronic respiratory failure with hypoxia (HCC)   Dementia (HCC)   Essential hypertension, benign   COPD (chronic obstructive pulmonary disease) (HCC)   Fever, unknown origin   Brief Summary:- 85 year old with past medical history relevant for COPD, CAD, HTN and advanced dementia admitted on 02/10/2019 with fevers, leukocytosis, lethargy and respiratory symptoms as well as hypoxia consistent with presumed pneumonia, also found to have acute urinary retention  A/p 1)Sepsis secondary to Rt LL  CAP--- cough and congestion is not worse , on admission patient met sepsis criteria with fevers, leukocytosis, lethargy/metabolic encephalopathy, hypoxia, tachpnea and increased oxygen requirements, lactic acid was not elevated.  Admission chest x-ray and repeat chest x-ray on 02/11/2019 suggest right lower lobe pneumonia.  COVID negative, continue Rocephin and doxycycline, bronchodilators as ordered, continues to require oxygen .... WBC trending up, white count is now 12.0  2) acute hypoxic respiratory failure/COPD--- patient with history of COPD, previously  was on home O2 ,but discontinued several months ago-continue bronchodilators, treat pneumonia as above #1, patient will need to be qualified for home O2 prior to discharge  3)Generalized Weakness/Debility--- PT recommends SNF rehab, however POA/son wants discharge home with home health services instead  4)CAD--stable, asymptomatic, continue atorvastatin and clopidogrel  5)Dementia--at baseline patient has significant cognitive deficits, no significant behavioral problems , continue buspirone 15 mg twice daily, Risperdal 0.25 mg twice daily as needed, Namenda 5 mg twice daily Aricept 10 mg at bedtime  6)Social/Ethics--- plan of care and CODE STATUS discussed with patient's son and POA [name], he request DNR/DNI status  7)HTN--- BP and heart rate were elevated overnight, started on amlodipine, Coreg, lisinopril has been increased to 10 mg daily  8) urinary retention----patient has required in and out catheterization x2, if still unable to void may need Foley placement  8)Tachyarrythmia/SVT--- currently on coreg 6.88m bid....Marland KitchenMarland KitchenMarland Kitchenay titrate up as BP tolerates or switch to metoprolol for better rate control  Disposition/Need for in-Hospital Stay- patient unable to be discharged at this time due to pneumonia with hypoxic respiratory failure requiring IV antibiotics and supplemental oxygen  Code Status : DNR/DNI  Family Communication:   D/w her son--  CKellie [phone]  Disposition Plan  : TBD (PT recommends SNF rehab POA/son wants discharge home with home health)  Consults  :  na  DVT Prophylaxis  :    SCDs (intolerance to heparin)  Lab Results  Component Value  Date   PLT 224 02/12/2019   Inpatient Medications  Scheduled Meds: . amLODipine  10 mg Oral Daily  . atorvastatin  20 mg Oral q1800  . bisacodyl  10 mg Rectal Once  . busPIRone  15 mg Oral BID  . carvedilol  6.25 mg Oral BID WC  . clopidogrel  75 mg Oral Daily  . donepezil  10 mg  Oral QHS  . feeding supplement  1 Container Oral BID BM  . feeding supplement (PRO-STAT SUGAR FREE 64)  30 mL Oral BID  . lisinopril  10 mg Oral Daily  . memantine  5 mg Oral BID  . mirabegron ER  50 mg Oral Daily  . multivitamin  15 mL Oral Daily   Continuous Infusions: . 0.9 % NaCl with KCl 20 mEq / L 40 mL/hr at 02/11/19 2326  . cefTRIAXone (ROCEPHIN)  IV 1 g (02/11/19 2316)  . doxycycline (VIBRAMYCIN) IV 100 mg (02/12/19 0629)   PRN Meds:.acetaminophen **OR** acetaminophen, guaiFENesin-dextromethorphan, ipratropium-albuterol, ondansetron **OR** ondansetron (ZOFRAN) IV, polyethylene glycol, risperiDONE    Anti-infectives (From admission, onward)   Start     Dose/Rate Route Frequency Ordered Stop   02/11/19 0600  doxycycline (VIBRAMYCIN) 100 mg in sodium chloride 0.9 % 250 mL IVPB     100 mg 125 mL/hr over 120 Minutes Intravenous Every 12 hours 02/10/19 2234     02/10/19 2300  cefTRIAXone (ROCEPHIN) 1 g in sodium chloride 0.9 % 100 mL IVPB     1 g 200 mL/hr over 30 Minutes Intravenous Every 24 hours 02/10/19 2234     02/10/19 1800  doxycycline (VIBRAMYCIN) 100 mg in sodium chloride 0.9 % 250 mL IVPB     100 mg 125 mL/hr over 120 Minutes Intravenous  Once 02/10/19 1729 02/10/19 2040        Objective:   Vitals:   02/12/19 0657 02/12/19 0659 02/12/19 0706 02/12/19 0915  BP: (!) 202/68 (!) 195/77  (!) 150/61  Pulse: 71 78  65  Resp:    (!) 24  Temp:   98.4 F (36.9 C) 98.5 F (36.9 C)  TempSrc:   Oral Oral  SpO2:    96%  Weight:      Height:        Wt Readings from Last 3 Encounters:  02/11/19 59.7 kg  12/21/18 59.4 kg  11/18/18 57.2 kg    Intake/Output Summary (Last 24 hours) at 02/12/2019 1108 Last data filed at 02/12/2019 0900 Gross per 24 hour  Intake 2009.66 ml  Output 600 ml  Net 1409.66 ml   Physical Exam Gen:- Awake Alert, dyspnea on exertion noted, HEENT:- [place].AT, No sclera icterus Nose- [place] 2 L/min Neck-Supple Neck,No JVD,.  Lungs-diminished in  bases right more than left, few scattered rhonchi, no wheezing  CV- S1, S2 normal, regular  Abd-  +ve B.Sounds, Abd Soft, No tenderness,    Extremity/Skin:- No  edema, pedal pulses present  Psych-affect is appropriate, disoriented with cognitive deficits but overall cooperative neuro-generalized weakness, but no new focal deficits, no tremors   Data Review:   Micro Results Recent Results (from the past 240 hour(s))  Culture, blood (Routine x 2)     Status: None (Preliminary result)   Collection Time: 02/10/19  5:13 PM   Specimen: BLOOD LEFT HAND  Result Value Ref Range Status   Specimen Description BLOOD LEFT HAND  Final   Special Requests   Final    BOTTLES DRAWN AEROBIC ONLY Blood Culture adequate volume   Culture  Final    NO GROWTH 2 DAYS Performed at [facility], [address] [place], [place] [address]    Report Status PENDING  Incomplete  SARS Coronavirus 2 (CEPHEID- Performed in [place] hospital lab), Hosp Order     Status: None   Collection Time: 02/10/19  5:19 PM   Specimen: Nasopharyngeal Swab  Result Value Ref Range Status   SARS Coronavirus 2 NEGATIVE NEGATIVE Final    Comment: (NOTE) If result is NEGATIVE SARS-CoV-2 target nucleic acids are NOT DETECTED. The SARS-CoV-2 RNA is generally detectable in upper and lower  respiratory specimens during the acute phase of infection. The lowest  concentration of SARS-CoV-2 viral copies this assay can detect is 250  copies / mL. A negative result does not preclude SARS-CoV-2 infection  and should not be used as the sole basis for treatment or other  patient management decisions.  A negative result may occur with  improper specimen collection / handling, submission of specimen other  than nasopharyngeal swab, presence of viral mutation(s) within the  areas targeted by this assay, and inadequate number of viral copies  (<250 copies / mL). A negative result must be combined with clinical  observations, patient history,  and epidemiological information. If result is POSITIVE SARS-CoV-2 target nucleic acids are DETECTED. The SARS-CoV-2 RNA is generally detectable in upper and lower  respiratory specimens dur ing the acute phase of infection.  Positive  results are indicative of active infection with SARS-CoV-2.  Clinical  correlation with patient history and other diagnostic information is  necessary to determine patient infection status.  Positive results do  not rule out bacterial infection or co-infection with other viruses. If result is PRESUMPTIVE POSTIVE SARS-CoV-2 nucleic acids MAY BE PRESENT.   A presumptive positive result was obtained on the submitted specimen  and confirmed on repeat testing.  While 2019 novel coronavirus  (SARS-CoV-2) nucleic acids may be present in the submitted sample  additional confirmatory testing may be necessary for epidemiological  and / or clinical management purposes  to differentiate between  SARS-CoV-2 and other Sarbecovirus currently known to infect humans.  If clinically indicated additional testing with an alternate test  methodology [phone]) is advised. The SARS-CoV-2 RNA is generally  detectable in upper and lower respiratory sp ecimens during the acute  phase of infection. The expected result is Negative. Fact Sheet for Patients:  StrictlyIdeas.no Fact Sheet for Healthcare Providers: BankingDealers.co.za This test is not yet approved or cleared by the [place] FDA and has been authorized for detection and/or diagnosis of SARS-CoV-2 by FDA under an Emergency Use Authorization (EUA).  This EUA will remain in effect (meaning this test can be used) for the duration of the COVID-19 declaration under Section 564(b)(1) of the Act, 21 U.S.C. section 360bbb-3(b)(1), unless the authorization is terminated or revoked sooner. Performed at [facility], [address] [place], [place] [address]   Culture, blood  (Routine x 2)     Status: None (Preliminary result)   Collection Time: 02/10/19  5:49 PM   Specimen: Right Antecubital; Blood  Result Value Ref Range Status   Specimen Description RIGHT ANTECUBITAL  Final   Special Requests   Final    BOTTLES DRAWN AEROBIC AND ANAEROBIC Blood Culture adequate volume   Culture   Final    NO GROWTH 2 DAYS Performed at [facility], [address] [place], [place] [address]    Report Status PENDING  Incomplete    Radiology Reports Dg Chest [facility] [address] [place] [address]  Result Date: 02/11/2019 CLINICAL DATA:  Shortness of breath.  Cough and fever. EXAM: PORTABLE CHEST 1 VIEW COMPARISON:  One-view chest x-ray 02/10/2019. FINDINGS: The heart size is normal. Lung volumes are low. Right lower lobe airspace opacity is again noted. No other focal consolidation is present. A small right effusion is suspected. IMPRESSION: 1. Asymmetric right lower lobe airspace disease and probable effusion. This is concerning for early infection. Electronically Signed   By: San Morelle M.D.   On: 02/11/2019 06:04   Dg Chest Port 1 View  Result Date: 02/10/2019 CLINICAL DATA:  Altered mental status EXAM: PORTABLE CHEST 1 VIEW COMPARISON:  05/07/2018 FINDINGS: Right basilar atelectasis. Normal cardiomediastinal contours. No pleural effusion or pneumothorax. IMPRESSION: Right basilar atelectasis. Electronically Signed   By: Ulyses Jarred M.D.   On: 02/10/2019 18:34     CBC Recent Labs  Lab 02/10/19 1713 02/11/19 0524 02/12/19 0556  WBC 11.9* 10.7* 12.0*  HGB 11.0* 10.0* 10.8*  HCT 35.8* 32.9* 34.5*  PLT 210 183 224  MCV 98.6 99.4 97.7  MCH 30.3 30.2 30.6  MCHC 30.7 30.4 31.3  RDW 14.0 13.9 14.1  LYMPHSABS 0.9  --   --   MONOABS 1.0  --   --   EOSABS 0.5  --   --   BASOSABS 0.1  --   --     Chemistries  Recent Labs  Lab 02/10/19 1713 02/11/19 0524 02/12/19 0556  NA 142 144 145  K 2.9* 3.3* 4.1  CL 103 112* 114*  CO2 _0 GLUCOSE 128* 104* 111*  BUN 32* 21 12   CREATININE 0.94 0.74 0.64  CALCIUM 9.0 7.6* 7.4*  MG 2.2  --   --   AST 19  --   --   ALT 14  --   --   ALKPHOS 70  --   --   BILITOT 0.9  --   --    ------------------------------------------------------------------------------------------------------------------ No results for input(s): CHOL, HDL, LDLCALC, TRIG, CHOLHDL, LDLDIRECT in the last 72 hours.  Lab Results  Component Value Date   HGBA1C 5.8 (H) 06/06/2012   ------------------------------------------------------------------------------------------------------------------ No results for input(s): TSH, T4TOTAL, T3FREE, THYROIDAB in the last 72 hours.  Invalid input(s): FREET3 ------------------------------------------------------------------------------------------------------------------ No results for input(s): VITAMINB12, FOLATE, FERRITIN, TIBC, IRON, RETICCTPCT in the last 72 hours.  Coagulation profile Recent Labs  Lab 02/10/19 1713  INR 1.0    No results for input(s): DDIMER in the last 72 hours.  Cardiac Enzymes No results for input(s): CKMB, TROPONINI, MYOGLOBIN in the last 168 hours.  Invalid input(s): CK ------------------------------------------------------------------------------------------------------------------ No results found for: BNP   Roxan Hockey M.D on 02/12/2019 at 11:08 AM  Go to [email or web address] - for contact info  [facility] - Office  [phone]

## 2019-02-12 NOTE — Progress Notes (Signed)
Episode of Tachyarrythmia/SVT again--- will give cardizem iv x 1 and monitor...   Post IV cardizem 10 mg x 1..... ...... HR 59, NSR, B/P 120/59, O2 SAT 93% RA DENIES C/O PAIN, NO LONGER DIAPHORETIC   Roxan Hockey, MD

## 2019-02-12 NOTE — Progress Notes (Signed)
ATTEMPTED TO IN & OUT CATH PT., INCONTINENT OF MODERATE AMT. OF SOFT BROWN STOOL, PT VOIDED MODERATE AMT. OF CLEAR YELLOW URINE WHILE TURNED FOR INCONTINENCE CARE, PAD WAS NOTED TO BE  SOAKED WITH BOTH URINE & STOOL. WILL CONTINUE TO MONITOR PT. FOR URINARY INCONTINENCE OR CONTINUE WITH  BLADDER SCAN IF NECESSARY.

## 2019-02-13 ENCOUNTER — Inpatient Hospital Stay (HOSPITAL_COMMUNITY): Payer: Medicare HMO

## 2019-02-13 ENCOUNTER — Other Ambulatory Visit: Payer: Self-pay

## 2019-02-13 DIAGNOSIS — I340 Nonrheumatic mitral (valve) insufficiency: Secondary | ICD-10-CM

## 2019-02-13 DIAGNOSIS — I361 Nonrheumatic tricuspid (valve) insufficiency: Secondary | ICD-10-CM

## 2019-02-13 LAB — BASIC METABOLIC PANEL
Anion gap: 5 (ref 5–15)
BUN: 16 mg/dL (ref 8–23)
CO2: 24 mmol/L (ref 22–32)
Calcium: 7.2 mg/dL — ABNORMAL LOW (ref 8.9–10.3)
Chloride: 114 mmol/L — ABNORMAL HIGH (ref 98–111)
Creatinine, Ser: 0.7 mg/dL (ref 0.44–1.00)
GFR calc Af Amer: >60 mL/min (ref >60)
GFR calc non Af Amer: >60 mL/min (ref >60)
Glucose, Bld: 149 mg/dL — ABNORMAL HIGH (ref 70–99)
Potassium: 4 mmol/L (ref 3.5–5.1)
Sodium: 143 mmol/L (ref 135–145)

## 2019-02-13 LAB — CBC
HCT: 37.1 % (ref 36.0–46.0)
HCT: 36.8 % (ref 36.0–46.0)
Hemoglobin: 10.9 g/dL — ABNORMAL LOW (ref 12.0–15.0)
Hemoglobin: 11.2 g/dL — ABNORMAL LOW (ref 12.0–15.0)
MCH: 30.5 pg (ref 26.0–34.0)
MCH: 30.4 pg (ref 26.0–34.0)
MCHC: 29.4 g/dL — ABNORMAL LOW (ref 30.0–36.0)
MCHC: 30.4 g/dL (ref 30.0–36.0)
MCV: 103.9 fL — ABNORMAL HIGH (ref 80.0–100.0)
MCV: 99.7 fL (ref 80.0–100.0)
Platelets: 249 10*3/uL (ref 150–400)
Platelets: 266 10*3/uL (ref 150–400)
RBC: 3.57 MIL/uL — ABNORMAL LOW (ref 3.87–5.11)
RBC: 3.69 MIL/uL — ABNORMAL LOW (ref 3.87–5.11)
RDW: 14 % (ref 11.5–15.5)
RDW: 13.9 % (ref 11.5–15.5)
WBC: 12.9 10*3/uL — ABNORMAL HIGH (ref 4.0–10.5)
WBC: 13.3 10*3/uL — ABNORMAL HIGH (ref 4.0–10.5)
nRBC: 0 % (ref 0.0–0.2)
nRBC: 0 % (ref 0.0–0.2)

## 2019-02-13 LAB — MAGNESIUM: Magnesium: 2.2 mg/dL (ref 1.7–2.4)

## 2019-02-13 LAB — ECHOCARDIOGRAM COMPLETE
Height: 63 in
Weight: 2105.83 oz

## 2019-02-13 MED ORDER — DILTIAZEM HCL 25 MG/5ML IV SOLN
10.0000 mg | Freq: Once | INTRAVENOUS | Status: AC
  Administered 2019-02-13: 10 mg via INTRAVENOUS
  Filled 2019-02-13: qty 5

## 2019-02-13 MED ORDER — CARVEDILOL 3.125 MG PO TABS
6.2500 mg | ORAL_TABLET | Freq: Two times a day (BID) | ORAL | Status: DC
  Administered 2019-02-13: 6.25 mg via ORAL
  Filled 2019-02-13: qty 2

## 2019-02-13 MED ORDER — DOXYCYCLINE HYCLATE 100 MG PO TABS
100.0000 mg | ORAL_TABLET | Freq: Two times a day (BID) | ORAL | Status: DC
  Administered 2019-02-13 – 2019-02-14 (×2): 100 mg via ORAL
  Filled 2019-02-13 (×3): qty 1

## 2019-02-13 MED ORDER — CARVEDILOL 12.5 MG PO TABS
12.5000 mg | ORAL_TABLET | Freq: Two times a day (BID) | ORAL | Status: DC
  Administered 2019-02-13: 12.5 mg via ORAL
  Filled 2019-02-13 (×2): qty 1

## 2019-02-13 MED ORDER — DILTIAZEM HCL 30 MG PO TABS
30.0000 mg | ORAL_TABLET | Freq: Three times a day (TID) | ORAL | Status: DC
  Administered 2019-02-13 – 2019-02-14 (×3): 30 mg via ORAL
  Filled 2019-02-13 (×3): qty 1

## 2019-02-13 MED ORDER — ISOSORBIDE MONONITRATE ER 60 MG PO TB24
30.0000 mg | ORAL_TABLET | Freq: Every day | ORAL | Status: DC
  Administered 2019-02-13 – 2019-02-14 (×2): 30 mg via ORAL
  Filled 2019-02-13 (×2): qty 1

## 2019-02-13 NOTE — Progress Notes (Signed)
Patient Demographics:    [name], is a 85 y.o. female, DOB - Apr 05, 1931, TRV:[id number]  Admit date - 02/10/2019   Admitting Physician Ejiroghene Arlyce Dice, MD  Outpatient Primary MD for the patient is [name], [name], FNP  LOS - 1   Chief Complaint  Patient presents with  . Fever        Subjective:    [name] today has no fevers, no emesis,  No chest pain, cough and congestion noted, requiring oxygen, remains pleasantly confused,... Voiding difficulties persist, required another in and out catheterization overnight,  Also had episode of tachyarrythmia overnight.... required iv labetalol  Assessment  & Plan :    Principal Problem:   Sepsis due to right lower lobe pneumonia Active Problems:   Rt LL CAP (community acquired pneumonia)   Acute on chronic respiratory failure with hypoxia (HCC)   Dementia (HCC)   Essential hypertension, benign   COPD (chronic obstructive pulmonary disease) (HCC)   Fever, unknown origin   Fever   Brief Summary:- 85 year old with past medical history relevant for COPD, CAD, HTN and advanced dementia admitted on 02/10/2019 with fevers, leukocytosis, lethargy and respiratory symptoms as well as hypoxia consistent with presumed pneumonia, also found to have acute urinary retention  A/p 1)Sepsis secondary to Rt LL  CAP--- cough and congestion is not worse , on admission patient met sepsis criteria with fevers, leukocytosis, lethargy/metabolic encephalopathy, hypoxia, tachpnea and increased oxygen requirements, lactic acid was not elevated.  Admission chest x-ray and repeat chest x-ray on 02/11/2019 suggest right lower lobe pneumonia.  COVID negative, continue Rocephin and doxycycline, bronchodilators as ordered, continues to require oxygen .... WBC is 13.3  2)Acute hypoxic respiratory failure/COPD--- patient with history of COPD, previously was on home O2 ,but  discontinued several months ago-continue bronchodilators, treat pneumonia as above #1, patient will need to be qualified for home O2 prior to discharge  3)Generalized Weakness/Debility--- PT recommends SNF rehab, however POA/son wants discharge home with home health services instead  4)CAD--stable, asymptomatic, continue atorvastatin and clopidogrel  5)Dementia--at baseline patient has significant cognitive deficits, no significant behavioral problems , continue buspirone 15 mg twice daily, Risperdal 0.25 mg twice daily as needed, Namenda 5 mg twice daily Aricept 10 mg at bedtime  6)Social/Ethics--- plan of care and CODE STATUS discussed with patient's son and POA [name], he request DNR/DNI status  7)HTN--- BP and heart rate were elevated overnight, started on amlodipine, Coreg, lisinopril has been increased to 10 mg daily  8)Urinary Retention----patient has required in and out catheterization x2, if still unable to void may need Foley placement  8)Recurrent Tachyarrythmia/SVT--- Echo with preserved EF of 60 to 65%, no regional wall motion abnormalities, treat empirically with Cardizem 30 mg 3 times daily, okay to stop Coreg   Disposition/Need for in-Hospital Stay- patient unable to be discharged at this time due to pneumonia with hypoxic respiratory failure requiring IV antibiotics and supplemental oxygen  Code Status : DNR/DNI  Family Communication:   D/w her son--  [name] [phone]  [phone]   Disposition Plan  : TBD (PT recommends SNF rehab POA/son wants discharge home with home health)  Consults  :  na  DVT Prophylaxis  :    SCDs (intolerance to heparin)  Lab Results  Component Value Date   PLT 266 02/13/2019   Inpatient Medications  Scheduled Meds: . atorvastatin  20 mg Oral q1800  . busPIRone  15 mg Oral BID  . clopidogrel  75 mg Oral Daily  . diltiazem  30 mg Oral TID  . donepezil  10 mg Oral QHS  . doxycycline  100 mg Oral Q12H  .  feeding supplement  1 Container Oral BID BM  . feeding supplement (PRO-STAT SUGAR FREE 64)  30 mL Oral BID  . isosorbide mononitrate  30 mg Oral Daily  . lisinopril  10 mg Oral Daily  . memantine  5 mg Oral BID  . mirabegron ER  50 mg Oral Daily  . multivitamin  15 mL Oral Daily   Continuous Infusions: . 0.9 % NaCl with KCl 20 mEq / L 40 mL/hr at 02/12/19 1623  . cefTRIAXone (ROCEPHIN)  IV 1 g (02/12/19 2212)   PRN Meds:.acetaminophen **OR** acetaminophen, guaiFENesin-dextromethorphan, ipratropium-albuterol, ondansetron **OR** ondansetron (ZOFRAN) IV, polyethylene glycol, risperiDONE    Anti-infectives (From admission, onward)   Start     Dose/Rate Route Frequency Ordered Stop   02/13/19 1800  doxycycline (VIBRA-TABS) tablet 100 mg     100 mg Oral Every 12 hours 02/13/19 0955     02/11/19 0600  doxycycline (VIBRAMYCIN) 100 mg in sodium chloride 0.9 % 250 mL IVPB  Status:  Discontinued     100 mg 125 mL/hr over 120 Minutes Intravenous Every 12 hours 02/10/19 2234 02/13/19 0955   02/10/19 2300  cefTRIAXone (ROCEPHIN) 1 g in sodium chloride 0.9 % 100 mL IVPB     1 g 200 mL/hr over 30 Minutes Intravenous Every 24 hours 02/10/19 2234     02/10/19 1800  doxycycline (VIBRAMYCIN) 100 mg in sodium chloride 0.9 % 250 mL IVPB     100 mg 125 mL/hr over 120 Minutes Intravenous  Once 02/10/19 1729 02/10/19 2040        Objective:   Vitals:   02/12/19 2124 02/12/19 2213 02/13/19 0619 02/13/19 1445  BP: (!) 153/107 (!) 136/47 (!) 139/52 (!) 156/60  Pulse: 67  (!) 59 (!) 55  Resp: '17  18 18  ' Temp: 97.8 F (36.6 C)  98.4 F (36.9 C) 98.3 F (36.8 C)  TempSrc: Oral  Oral   SpO2: 95%  96% 98%  Weight:      Height:        Wt Readings from Last 3 Encounters:  02/11/19 59.7 kg  12/21/18 59.4 kg  11/18/18 57.2 kg    Intake/Output Summary (Last 24 hours) at 02/13/2019 1738 Last data filed at 02/12/2019 1900 Gross per 24 hour  Intake 60 ml  Output -  Net 60 ml   Physical Exam Gen:-  Awake Alert, in no acute distress HEENT:- [place].AT, No sclera icterus Nose-  2 L/min Neck-Supple Neck,No JVD,.  Lungs-improving air movement, few scattered rhonchi, no wheezing  CV- S1, S2 normal, regular  Abd-  +ve B.Sounds, Abd Soft, No tenderness,    Extremity/Skin:- No  edema, pedal pulses present  Psych-affect is appropriate, disoriented with cognitive deficits but overall cooperative neuro-generalized weakness, but no new focal deficits, no tremors   Data Review:   Micro Results Recent Results (from the past 240 hour(s))  Culture, blood (Routine x 2)     Status: None (Preliminary result)   Collection Time: 02/10/19  5:13 PM   Specimen: BLOOD LEFT HAND  Result Value Ref Range Status   Specimen Description BLOOD LEFT  HAND  Final   Special Requests   Final    BOTTLES DRAWN AEROBIC ONLY Blood Culture adequate volume   Culture   Final    NO GROWTH 3 DAYS Performed at [facility], [address], [place], [place] [address]    Report Status PENDING  Incomplete  SARS Coronavirus 2 (CEPHEID- Performed in [place] hospital lab), Hosp Order     Status: None   Collection Time: 02/10/19  5:19 PM   Specimen: Nasopharyngeal Swab  Result Value Ref Range Status   SARS Coronavirus 2 NEGATIVE NEGATIVE Final    Comment: (NOTE) If result is NEGATIVE SARS-CoV-2 target nucleic acids are NOT DETECTED. The SARS-CoV-2 RNA is generally detectable in upper and lower  respiratory specimens during the acute phase of infection. The lowest  concentration of SARS-CoV-2 viral copies this assay can detect is 250  copies / mL. A negative result does not preclude SARS-CoV-2 infection  and should not be used as the sole basis for treatment or other  patient management decisions.  A negative result may occur with  improper specimen collection / handling, submission of specimen other  than nasopharyngeal swab, presence of viral mutation(s) within the  areas targeted by this assay, and inadequate number of  viral copies  (<250 copies / mL). A negative result must be combined with clinical  observations, patient history, and epidemiological information. If result is POSITIVE SARS-CoV-2 target nucleic acids are DETECTED. The SARS-CoV-2 RNA is generally detectable in upper and lower  respiratory specimens dur ing the acute phase of infection.  Positive  results are indicative of active infection with SARS-CoV-2.  Clinical  correlation with patient history and other diagnostic information is  necessary to determine patient infection status.  Positive results do  not rule out bacterial infection or co-infection with other viruses. If result is PRESUMPTIVE POSTIVE SARS-CoV-2 nucleic acids MAY BE PRESENT.   A presumptive positive result was obtained on the submitted specimen  and confirmed on repeat testing.  While 2019 novel coronavirus  (SARS-CoV-2) nucleic acids may be present in the submitted sample  additional confirmatory testing may be necessary for epidemiological  and / or clinical management purposes  to differentiate between  SARS-CoV-2 and other Sarbecovirus currently known to infect humans.  If clinically indicated additional testing with an alternate test  methodology [id number]) is advised. The SARS-CoV-2 RNA is generally  detectable in upper and lower respiratory sp ecimens during the acute  phase of infection. The expected result is Negative. Fact Sheet for Patients:  StrictlyIdeas.no Fact Sheet for Healthcare Providers: BankingDealers.co.za This test is not yet approved or cleared by the [place] FDA and has been authorized for detection and/or diagnosis of SARS-CoV-2 by FDA under an Emergency Use Authorization (EUA).  This EUA will remain in effect (meaning this test can be used) for the duration of the COVID-19 declaration under Section 564(b)(1) of the Act, 21 U.S.C. section 360bbb-3(b)(1), unless the authorization is  terminated or revoked sooner. Performed at [facility], [address], [place], [place] [address]   Culture, blood (Routine x 2)     Status: None (Preliminary result)   Collection Time: 02/10/19  5:49 PM   Specimen: Right Antecubital; Blood  Result Value Ref Range Status   Specimen Description RIGHT ANTECUBITAL  Final   Special Requests   Final    BOTTLES DRAWN AEROBIC AND ANAEROBIC Blood Culture adequate volume   Culture   Final    NO GROWTH 3 DAYS Performed at [facility], [address] [place], [place] [address]    Report Status PENDING  Incomplete  Culture, Urine     Status: None   Collection Time: 02/10/19 11:30 PM   Specimen: Urine, Random  Result Value Ref Range Status   Specimen Description   Final    URINE, RANDOM Performed at [facility], [address], [place], [place] [address]    Special Requests   Final    NONE Performed at [facility], [address], [place], [place] [address]    Culture   Final    NO GROWTH Performed at [facility], [place] [address] [place], [place] [address]    Report Status 02/12/2019 FINAL  Final    Radiology Reports Dg Chest Port 1 View  Result Date: 02/11/2019 CLINICAL DATA:  Shortness of breath.  Cough and fever. EXAM: PORTABLE CHEST 1 VIEW COMPARISON:  One-view chest x-ray 02/10/2019. FINDINGS: The heart size is normal. Lung volumes are low. Right lower lobe airspace opacity is again noted. No other focal consolidation is present. A small right effusion is suspected. IMPRESSION: 1. Asymmetric right lower lobe airspace disease and probable effusion. This is concerning for early infection. Electronically Signed   By: San Morelle M.D.   On: 02/11/2019 06:04   Dg Chest Port 1 View  Result Date: 02/10/2019 CLINICAL DATA:  Altered mental status EXAM: PORTABLE CHEST 1 VIEW COMPARISON:  05/07/2018 FINDINGS: Right basilar atelectasis. Normal cardiomediastinal contours. No pleural effusion or pneumothorax. IMPRESSION: Right  basilar atelectasis. Electronically Signed   By: Ulyses Jarred M.D.   On: 02/10/2019 18:34     CBC Recent Labs  Lab 02/10/19 1713 02/11/19 0524 02/12/19 0556 02/13/19 0714 02/13/19 1111  WBC 11.9* 10.7* 12.0* 12.9* 13.3*  HGB 11.0* 10.0* 10.8* 10.9* 11.2*  HCT 35.8* 32.9* 34.5* 37.1 36.8  PLT 210 183 224 249 266  MCV 98.6 99.4 97.7 103.9* 99.7  MCH 30.3 30.2 30.6 30.5 30.4  MCHC 30.7 30.4 31.3 29.4* 30.4  RDW 14.0 13.9 14.1 14.0 13.9  LYMPHSABS 0.9  --   --   --   --   MONOABS 1.0  --   --   --   --   EOSABS 0.5  --   --   --   --   BASOSABS 0.1  --   --   --   --     Chemistries  Recent Labs  Lab 02/10/19 1713 02/11/19 0524 02/12/19 0556 02/13/19 1111  NA 142 144 145 143  K 2.9* 3.3* 4.1 4.0  CL 103 112* 114* 114*  CO2 '28 24 24 24  ' GLUCOSE 128* 104* 111* 149*  BUN 32* '21 12 16  ' CREATININE 0.94 0.74 0.64 0.70  CALCIUM 9.0 7.6* 7.4* 7.2*  MG 2.2  --   --  2.2  AST 19  --   --   --   ALT 14  --   --   --   ALKPHOS 70  --   --   --   BILITOT 0.9  --   --   --    ------------------------------------------------------------------------------------------------------------------ No results for input(s): CHOL, HDL, LDLCALC, TRIG, CHOLHDL, LDLDIRECT in the last 72 hours.  Lab Results  Component Value Date   HGBA1C 5.8 (H) 06/06/2012   ------------------------------------------------------------------------------------------------------------------ No results for input(s): TSH, T4TOTAL, T3FREE, THYROIDAB in the last 72 hours.  Invalid input(s): FREET3 ------------------------------------------------------------------------------------------------------------------ No results for input(s): VITAMINB12, FOLATE, FERRITIN, TIBC, IRON, RETICCTPCT in the last 72 hours.  Coagulation profile Recent Labs  Lab 02/10/19 1713  INR 1.0    No results for input(s): DDIMER in the last 72 hours.  Cardiac Enzymes No results for input(s): CKMB, TROPONINI, MYOGLOBIN in the last  168 hours.  Invalid input(s): CK ------------------------------------------------------------------------------------------------------------------ No results found for: BNP   Roxan Hockey M.D on 02/13/2019 at 5:38 PM  Go to [email or web address] - for contact info  [facility] - Office  [phone]

## 2019-02-13 NOTE — Progress Notes (Signed)
Gave pt 10mg  one time dose of cardizem IV pushed slow. Pt had 5.8 second pause. Patient returned to sinus bradycardia. MD made aware of pause. Will continue to monitor throughout shift.

## 2019-02-13 NOTE — Progress Notes (Signed)
*  PRELIMINARY RESULTS* Echocardiogram 2D Echocardiogram has been performed.  [name] 02/13/2019, 4:07 PM

## 2019-02-13 NOTE — Progress Notes (Signed)
In and out patient. Bladder returned 324mls of urine. Patient tolerated well. Will continue to monitor throughout shift.

## 2019-02-13 NOTE — Progress Notes (Signed)
Patient heart rate sustaining in the 150's, paged mid level. Mid level ordered Cardizem 10mg  Iv one time order. Will continue to monitor throughout shift.

## 2019-02-13 NOTE — Progress Notes (Signed)
Bladder scanned patient . Bladder scan showed greater than 490. Paged mid level. Mid level gave order to In and out catheter patient. Will continue to monitor throughout shift.

## 2019-02-14 DIAGNOSIS — I10 Essential (primary) hypertension: Secondary | ICD-10-CM

## 2019-02-14 LAB — CBC
HCT: 33.1 % — ABNORMAL LOW (ref 36.0–46.0)
Hemoglobin: 10.3 g/dL — ABNORMAL LOW (ref 12.0–15.0)
MCH: 30.7 pg (ref 26.0–34.0)
MCHC: 31.1 g/dL (ref 30.0–36.0)
MCV: 98.8 fL (ref 80.0–100.0)
Platelets: 244 10*3/uL (ref 150–400)
RBC: 3.35 MIL/uL — ABNORMAL LOW (ref 3.87–5.11)
RDW: 13.8 % (ref 11.5–15.5)
WBC: 11.5 10*3/uL — ABNORMAL HIGH (ref 4.0–10.5)
nRBC: 0 % (ref 0.0–0.2)

## 2019-02-14 MED ORDER — POLYETHYLENE GLYCOL 3350 17 G PO PACK: 17.0000 g | PACK | Freq: Every day | ORAL | 0 refills | Status: AC | PRN

## 2019-02-14 MED ORDER — RISPERIDONE 0.25 MG PO TABS: 0.2500 mg | ORAL_TABLET | Freq: Two times a day (BID) | ORAL | 2 refills | Status: DC | PRN

## 2019-02-14 MED ORDER — DOXYCYCLINE HYCLATE 100 MG PO TABS: 100.0000 mg | ORAL_TABLET | Freq: Two times a day (BID) | ORAL | 0 refills | Status: AC

## 2019-02-14 MED ORDER — FUROSEMIDE 20 MG PO TABS: 20.0000 mg | ORAL_TABLET | ORAL | 3 refills | Status: AC

## 2019-02-14 MED ORDER — ISOSORBIDE MONONITRATE ER 30 MG PO TB24: 30.0000 mg | ORAL_TABLET | Freq: Every day | ORAL | 2 refills | Status: DC

## 2019-02-14 MED ORDER — ACETAMINOPHEN 325 MG PO TABS: 650.0000 mg | ORAL_TABLET | Freq: Four times a day (QID) | ORAL | 1 refills | Status: AC | PRN

## 2019-02-14 MED ORDER — CEFDINIR 300 MG PO CAPS: 300.0000 mg | ORAL_CAPSULE | Freq: Two times a day (BID) | ORAL | 0 refills | Status: AC

## 2019-02-14 MED ORDER — DILTIAZEM HCL ER COATED BEADS 120 MG PO CP24: 120.0000 mg | ORAL_CAPSULE | Freq: Every day | ORAL | 4 refills | Status: AC

## 2019-02-14 MED ORDER — TAMSULOSIN HCL 0.4 MG PO CAPS: 0.4000 mg | ORAL_CAPSULE | Freq: Every day | ORAL | 0 refills | Status: AC

## 2019-02-14 MED ORDER — GUAIFENESIN 100 MG/5ML PO LIQD: 400.0000 mg | Freq: Three times a day (TID) | ORAL | 0 refills | Status: AC | PRN

## 2019-02-14 NOTE — Evaluation (Signed)
Clinical/Bedside Swallow Evaluation Patient Details  Name: [name] MRN: [id number] Date of Birth: 08/10/1931  Today's Date: 02/14/2019 Time: SLP Start Time (ACUTE ONLY): 1311 SLP Stop Time (ACUTE ONLY): 1349 SLP Time Calculation (min) (ACUTE ONLY): 38 min  Past Medical History:  Past Medical History:  Diagnosis Date  . Anxiety   . COPD (chronic obstructive pulmonary disease) ([place])   . Coronary atherosclerosis of native coronary artery    DES LAD and BMS LAD 8/08  . Esophageal stricture   . Essential hypertension, benign   . GERD (gastroesophageal reflux disease)   . MI (myocardial infarction) ([place]) 2008  . Mixed hyperlipidemia   . Osteoporosis   . Pelvic fracture ([place]) 08/2014  . Resting tremor   . Rickets    Past Surgical History:  Past Surgical History:  Procedure Laterality Date  . APPENDECTOMY    . Bilateral cataract surgery    . CARDIAC CATHETERIZATION    . CHOLECYSTECTOMY    . CORONARY ANGIOPLASTY WITH STENT PLACEMENT    . Left Sacroiliac Joint Injection  11/27/11   DR. O'TOOLE  . Left Trochanteric Bursa Injection  11/27/11   DR. Hinds    . YAG LASER APPLICATION  2/0/9470   Procedure: YAG LASER APPLICATION;  Surgeon: Williams Che, MD;  Location: [facility];  Service: Ophthalmology;  Laterality: Left;   HPI:  [name] is a 85 y.o. female with medical history significant for dementia, hypertension, COPD, CAD, who was brought to the ED with reports of fever, cough and difficulty breathing that started today.  Also increased weakness.  Temperature of 101 was recorded at PCPs office, with tachycardia and tachypnea up to 40. Pt with PNA and BSE requested.   Assessment / Plan / Recommendation Clinical Impression  Pt seen sitting up in chair for BSE with lunch meal (regular textures and thin liquid). Oral motor examination reveals xerostomia. Pt attempted to swallow, however unable due to xerostomia (improved with sips water).  Pt shows overt signs of aspiration/penetration with self presented cup and straw sips of thin liquid (coughing and wheezing). Pt with improved performance with SLP assist to control rate and amount. Pt with impaired mastication of solids and results in delayed coughing and "hawking" with self presented bites of potato with skin. Pt with slight tremor and has difficulty with self feeding and this likely impacts oral control. Pt with improved performance with soft solids and NTL. SLP will downgrade diet and check Pt tomorrow as she may benefit from MBSS.  SLP Visit Diagnosis: Dysphagia, unspecified (R13.10)    Aspiration Risk  Moderate aspiration risk;Risk for inadequate nutrition/hydration    Diet Recommendation Dysphagia 2 (Fine chop);Nectar-thick liquid   Liquid Administration via: Cup Medication Administration: Whole meds with liquid Supervision: Staff to assist with self feeding;Full supervision/cueing for compensatory strategies Compensations: Slow rate;Small sips/bites Postural Changes: Seated upright at 90 degrees;Remain upright for at least 30 minutes after po intake    Other  Recommendations Oral Care Recommendations: Oral care BID;Staff/trained caregiver to provide oral care;Oral care before and after PO Other Recommendations: Order thickener from pharmacy;Prohibited food (jello, ice cream, thin soups);Clarify dietary restrictions   Follow up Recommendations Skilled Nursing facility      Frequency and Duration min 2x/week  1 week       Prognosis Prognosis for Safe Diet Advancement: Fair Barriers to Reach Goals: Cognitive deficits;Severity of deficits      Swallow Study   General Date of Onset: 02/10/19 HPI:  Suzanne MillinKatherine [name] is a 85 y.o. female with medical history significant for dementia, hypertension, COPD, CAD, who was brought to the ED with reports of fever, cough and difficulty breathing that started today.  Also increased weakness.  Temperature of 101 was recorded at  PCPs office, with tachycardia and tachypnea up to 40. Pt with PNA and BSE requested. Type of Study: Bedside Swallow Evaluation Previous Swallow Assessment: None on record Diet Prior to this Study: Regular;Thin liquids Temperature Spikes Noted: No Respiratory Status: Nasal cannula History of Recent Intubation: No Behavior/Cognition: Alert;Cooperative;Pleasant mood;Confused;Requires cueing Oral Cavity Assessment: Within Functional Limits;Dry Oral Care Completed by SLP: Yes Oral Cavity - Dentition: Edentulous Vision: Functional for self-feeding Self-Feeding Abilities: Needs assist Patient Positioning: Upright in chair Baseline Vocal Quality: Normal Volitional Cough: Cognitively unable to elicit Volitional Swallow: Unable to elicit    Oral/Motor/Sensory Function Overall Oral Motor/Sensory Function: Within functional limits   Ice Chips Ice chips: Within functional limits Presentation: Spoon   Thin Liquid Thin Liquid: Impaired Presentation: Cup;Self Fed;Straw Oral Phase Impairments: Reduced lingual movement/coordination Pharyngeal  Phase Impairments: Suspected delayed Swallow;Multiple swallows;Cough - Immediate;Cough - Delayed    Nectar Thick Nectar Thick Liquid: Impaired Presentation: Cup;Self Fed;Straw Oral Phase Impairments: Reduced lingual movement/coordination Pharyngeal Phase Impairments: Suspected delayed Swallow   Honey Thick Honey Thick Liquid: Not tested   Puree Puree: Within functional limits Presentation: Spoon   Solid     Solid: Impaired Presentation: Self Fed Oral Phase Impairments: Impaired mastication Oral Phase Functional Implications: Prolonged oral transit Pharyngeal Phase Impairments: Cough - Delayed     Thank you,  Havery MorosDabney Porter, CCC-SLP [phone]  PORTER,DABNEY 02/14/2019,1:57 PM

## 2019-02-14 NOTE — Discharge Instructions (Signed)
1) keep Foley catheter in due to urinary retention--- follow-up with Dr. Lovena Neighbours or 1 of his partners at the urology clinic in [place] in about 3 weeks possible removal of the Foley catheter 2) take medications as prescribed 3)Dysphagia 2 (Fine chop solids);  Nectar-thick liquid 4) feeding and swallowing recommendations as above #3 and as below--- Liquid Administration via: Cup Medication Administration: Whole meds with liquid Supervision: Family to assist with self feeding;Full supervision/cueing for compensatory strategies Compensations: Slow rate;Small sips/bites Postural Changes: Seated upright at 90 degrees;Remain upright for at least 30 minutes after oral intake

## 2019-02-14 NOTE — Progress Notes (Signed)
Pt home health services arranged with [facility]. Follow up appointment with PCP is scheduled for this Thursday at 3:50 pm in office visit.   No further discharge needs at this time.

## 2019-02-14 NOTE — Plan of Care (Signed)
  Problem: Education: Goal: Knowledge of General Education information will improve Description: Including pain rating scale, medication(s)/side effects and non-pharmacologic comfort measures 02/14/2019 1501 by Rance Muir, RN Outcome: Adequate for Discharge 02/14/2019 1257 by Rance Muir, RN Outcome: Progressing   Problem: Health Behavior/Discharge Planning: Goal: Ability to manage health-related needs will improve 02/14/2019 1501 by Rance Muir, RN Outcome: Adequate for Discharge 02/14/2019 1257 by Rance Muir, RN Outcome: Progressing   Problem: Clinical Measurements: Goal: Ability to maintain clinical measurements within normal limits will improve 02/14/2019 1501 by Rance Muir, RN Outcome: Adequate for Discharge 02/14/2019 1257 by Rance Muir, RN Outcome: Progressing Goal: Will remain free from infection 02/14/2019 1501 by Rance Muir, RN Outcome: Adequate for Discharge 02/14/2019 1257 by Rance Muir, RN Outcome: Progressing Goal: Diagnostic test results will improve 02/14/2019 1501 by Rance Muir, RN Outcome: Adequate for Discharge 02/14/2019 1257 by Rance Muir, RN Outcome: Progressing Goal: Respiratory complications will improve 02/14/2019 1501 by Rance Muir, RN Outcome: Adequate for Discharge 02/14/2019 1257 by Rance Muir, RN Outcome: Progressing Goal: Cardiovascular complication will be avoided 02/14/2019 1501 by Rance Muir, RN Outcome: Adequate for Discharge 02/14/2019 1257 by Rance Muir, RN Outcome: Progressing   Problem: Activity: Goal: Risk for activity intolerance will decrease 02/14/2019 1501 by Rance Muir, RN Outcome: Adequate for Discharge 02/14/2019 1257 by Rance Muir, RN Outcome: Progressing   Problem: Nutrition: Goal: Adequate nutrition will be maintained 02/14/2019 1501 by Rance Muir, RN Outcome: Adequate for Discharge 02/14/2019 1257 by Rance Muir, RN Outcome: Progressing   Problem: Coping: Goal: Level of anxiety will decrease 02/14/2019 1501 by Rance Muir, RN Outcome: Adequate for  Discharge 02/14/2019 1257 by Rance Muir, RN Outcome: Progressing   Problem: Elimination: Goal: Will not experience complications related to bowel motility 02/14/2019 1501 by Rance Muir, RN Outcome: Adequate for Discharge 02/14/2019 1257 by Rance Muir, RN Outcome: Progressing Goal: Will not experience complications related to urinary retention 02/14/2019 1501 by Rance Muir, RN Outcome: Adequate for Discharge 02/14/2019 1257 by Rance Muir, RN Outcome: Progressing   Problem: Pain Managment: Goal: General experience of comfort will improve 02/14/2019 1501 by Rance Muir, RN Outcome: Adequate for Discharge 02/14/2019 1257 by Rance Muir, RN Outcome: Progressing   Problem: Safety: Goal: Ability to remain free from injury will improve 02/14/2019 1501 by Rance Muir, RN Outcome: Adequate for Discharge 02/14/2019 1257 by Rance Muir, RN Outcome: Progressing   Problem: Skin Integrity: Goal: Risk for impaired skin integrity will decrease 02/14/2019 1501 by Rance Muir, RN Outcome: Adequate for Discharge 02/14/2019 1257 by Rance Muir, RN Outcome: Progressing

## 2019-02-14 NOTE — Care Management Important Message (Signed)
Important Message  Patient Details  Name: [name] MRN: [id number] Date of Birth: 11-08-1930   Medicare Important Message Given:  Yes    Tommy Medal 02/14/2019, 4:02 PM

## 2019-02-14 NOTE — Progress Notes (Signed)
Physical Therapy Treatment Patient Details Name: Suzanne MillinKatherine Merk MRN: [id number] DOB: 08-24-1931 Today's Date: 02/14/2019    History of Present Illness [name] is a 85 y.o. female with medical history significant for dementia, hypertension, COPD, CAD, who was brought to the ED with reports of fever, cough and difficulty breathing that started today.  Also increased weakness.  Temperature of 101 was recorded at PCPs office, with tachycardia and tachypnea up to 40.History is obtained from daughter who is at bedside (works in the endoscopy unit here at [employer]), Patient has dementia and is not able to give me details or answer my questions though she is awake and appears alert.  At baseline she talks very little, and needs two-person assistance with ambulation.    PT Comments    Patient agreeable for therapy and presents with much shakiness all over body upon sitting up at bedside that continued throughout treatment.  Patient demonstrates poor carryover for attempting exercises, tolerated standing with RW for up to 4-5 minutes while being wiped by NT, able to take a few steps [facility].  Patient unable to ambulate away from bedside due to weakness, fall risk and fatigue.  Patient tolerated sitting up in chair after therapy - NT aware.  Patient will benefit from continued physical therapy in hospital and recommended venue below to increase strength, balance, endurance for safe ADLs and gait.    Follow Up Recommendations  SNF;Supervision for mobility/OOB;Supervision/Assistance - 24 hour     Equipment Recommendations  Rolling walker with 5" wheels    Recommendations for Other Services       Precautions / Restrictions Precautions Precautions: Fall Restrictions Weight Bearing Restrictions: No    Mobility  Bed Mobility Overal bed mobility: Needs Assistance Bed Mobility: Supine to Sit     Supine to sit: Max assist     General bed mobility comments: slow  labored movement  Transfers Overall transfer level: Needs assistance Equipment used: Rolling walker (2 wheeled) Transfers: Sit to/from [employer];Stand Pivot Transfers Sit to Stand: Mod assist Stand pivot transfers: Mod assist;Max assist       General transfer comment: very shaky, unsteady on feet [facility]  Ambulation/Gait Ambulation/Gait assistance: Max assist Gait Distance (Feet): 4 Feet Assistive device: Rolling walker (2 wheeled) Gait Pattern/deviations: Decreased step length - right;Decreased step length - left;Decreased stride length Gait velocity: slow   General Gait Details: limited to 4-5 unstedy labored slow shaky steps at bedside   Stairs             Wheelchair Mobility    Modified Rankin (Stroke Patients Only)       Balance Overall balance assessment: Needs assistance Sitting-balance support: Feet supported;No upper extremity supported Sitting balance-Leahy Scale: Fair     Standing balance support: Bilateral upper extremity supported;During functional activity Standing balance-Leahy Scale: Poor Standing balance comment: using RW                            Cognition Arousal/Alertness: Awake/alert Behavior During Therapy: WFL for tasks assessed/performed Overall Cognitive Status: History of cognitive impairments - at baseline                                 General Comments: mostly nonverbal      Exercises      General Comments        Pertinent  Vitals/Pain Pain Assessment: Faces Faces Pain Scale: Hurts even more Pain Location: over buttocks with pressure (when being cleaned) Pain Descriptors / Indicators: Sore;Grimacing Pain Intervention(s): Monitored during session;Limited activity within patient's tolerance    Home Living                      Prior Function            PT Goals (current goals can now be found in the care plan section) Acute Rehab PT Goals Patient Stated  Goal: not stated PT Goal Formulation: With patient Time For Goal Achievement: 02/25/19 Potential to Achieve Goals: Fair Progress towards PT goals: Progressing toward goals    Frequency    Min 3X/week      PT Plan Current plan remains appropriate    Co-evaluation              AM-PAC PT "6 Clicks" Mobility   Outcome Measure  Help needed turning from your back to your side while in a flat bed without using bedrails?: A Lot Help needed moving from lying on your back to sitting on the side of a flat bed without using bedrails?: A Lot Help needed moving to and from a bed to a chair (including a wheelchair)?: A Lot Help needed standing up from a chair using your arms (e.g., wheelchair or bedside chair)?: A Lot Help needed to walk in hospital room?: A Lot Help needed climbing 3-5 steps with a railing? : Total 6 Click Score: 11    End of Session   Activity Tolerance: Patient limited by fatigue;Patient tolerated treatment well Patient left: in chair;with call bell/phone within reach;with chair alarm set Nurse Communication: Mobility status PT Visit Diagnosis: Unsteadiness on feet (R26.81);Other abnormalities of gait and mobility (R26.89);Muscle weakness (generalized) (M62.81)     Time: 6962-9528 PT Time Calculation (min) (ACUTE ONLY): 34 min  Charges:  $Therapeutic Exercise: 23-37 mins                     1:21 PM, 02/14/19 [name], [name] Physical Therapist with [facility] 336 [phone] office [phone] mobile phone

## 2019-02-14 NOTE — Discharge Summary (Addendum)
[name], is a 85 y.o. female  DOB 1930-10-06  MRN [id number].  Admission date:  02/10/2019  Admitting Physician  Bethena Roys, MD  Discharge Date:  02/14/2019   Primary MD  Baruch Gouty, FNP  Recommendations for primary care physician for things to follow:   1) keep Foley catheter in due to urinary retention--- follow-up with Dr. Lovena Neighbours or 1 of his partners at the urology clinic in [place] in about 3 weeks possible removal of the Foley catheter 2) take medications as prescribed 3)Dysphagia 2 (Fine chop solids);  Nectar-thick liquid 4) feeding and swallowing recommendations as above #3 and as below--- Liquid Administration via: Cup Medication Administration: Whole meds with liquid Supervision: Family to assist with self feeding;Full supervision/cueing for compensatory strategies Compensations: Slow rate;Small sips/bites Postural Changes: Seated upright at 90 degrees;Remain upright for at least 30 minutes after oral intake    Admission Diagnosis  Fever [R50.9] Community acquired pneumonia, unspecified laterality [J18.9]   Discharge Diagnosis  Fever [R50.9] Community acquired pneumonia, unspecified laterality [J18.9]    Principal Problem:   Sepsis due to right lower lobe pneumonia Active Problems:   Rt LL CAP (community acquired pneumonia)   Acute on chronic respiratory failure with hypoxia ([place])   Dementia ([place])   Essential hypertension, benign   COPD (chronic obstructive pulmonary disease) ([place])   Fever, unknown origin   Fever      Past Medical History:  Diagnosis Date   Anxiety    COPD (chronic obstructive pulmonary disease) ([place])    Coronary atherosclerosis of native coronary artery    DES LAD and BMS LAD 8/08   Esophageal stricture    Essential hypertension, benign    GERD (gastroesophageal reflux disease)    MI (myocardial infarction) ([place]) 2008   Mixed  hyperlipidemia    Osteoporosis    Pelvic fracture ([place]) 08/2014   Resting tremor    [place]     Past Surgical History:  Procedure Laterality Date   APPENDECTOMY     Bilateral cataract surgery     CARDIAC CATHETERIZATION     CHOLECYSTECTOMY     CORONARY ANGIOPLASTY WITH STENT PLACEMENT     Left Sacroiliac Joint Injection  11/27/11   DR. O'TOOLE   Left Trochanteric Bursa Injection  11/27/11   DR. O'TOOLE   VEIN LIGATION AND STRIPPING     YAG LASER APPLICATION  12/03/6948   Procedure: YAG LASER APPLICATION;  Surgeon: Williams Che, MD;  Location: [facility];  Service: Ophthalmology;  Laterality: Left;       HPI  from the history and physical done on the day of admission:   Chief Complaint: SOB, Cough, fever  HPI: Inocencia Murtaugh is a 85 y.o. female with medical history significant for dementia, hypertension, COPD, CAD, who was brought to the ED with reports of fever, cough and difficulty breathing that started today.  Also increased weakness.  Temperature of 101 was recorded at PCPs office, with tachycardia and tachypnea up to 40. History is obtained from daughter who  is at bedside (works in the endoscopy unit here at [employer]), Patient has dementia and is not able to give me details or answer my questions though she is awake and appears alert.  At baseline she talks very little, and needs two-person assistance with ambulation.  ED Course: T max, 99.4.  Mostly 70s to 27s.  Blood pressure systolic 008Q to 761P.  Intermittent tachypnea to 30.  O2 sats greater than 93% on room air.  Chest x-ray right basilar atelectasis.  WBC 11.9.  Potassium 2.9.  EKG sinus rhythm, with no significant ST or T wave abnormalities.  SARS COVID 2 test negative.  Due to multiple antibiotic allergies patient was started on IV doxycycline.  Hospitalist to admit for possible pneumonia.  Review of Systems: Unable to obtain due to patient's dementia    Hospital Course:   Brief  Summary:- 85 year old with past medical history relevant for COPD, CAD, HTN and advanced dementia admitted on 02/10/2019 with fevers, leukocytosis, lethargy and respiratory symptoms as well as hypoxia consistent with presumed pneumonia, also found to have acute urinary retention, Foley placed, patient will have to follow-up with urology as outpatient  A/p 1)Sepsis secondary to Rt LL  CAP--- respiratory status is improved, hypoxia has resolved, on admission patient met sepsis criteria with fevers, leukocytosis, lethargy/metabolic encephalopathy, hypoxia, tachpnea and increased oxygen requirements, lactic acid was not elevated.  Admission chest x-ray and repeat chest x-ray on 02/11/2019 suggest right lower lobe pneumonia.  COVID negative, patient was treated with Rocephin and doxycycline and  bronchodilators .  respiratory status has improved, hypoxia has resolved,  ---WBC is down to 11.  Remains afebrile... Okay to discharge home on oral Omnicef and oral doxycycline...   2)Acute Hypoxic Respiratory Failure/COPD--- patient with history of COPD, previously was on home O2 ,but discontinued several months ago-treated with bronchodilators, treat pneumonia as above #1, patient does not qualify for home O2 at this time as her O2 sats is 98% room  3)Generalized Weakness/Debility--- PT recommends SNF rehab, however POA/son wants discharge home with home health services instead--- per family request patient is being discharged home with home health services  4)CAD--stable, asymptomatic, continue atorvastatin and clopidogrel  5)Dementia--at baseline patient has significant cognitive deficits, no significant behavioral problems , continue buspirone 15 mg twice daily, Risperdal , Namenda 5 mg twice daily Aricept 10 mg at bedtime  6)Social/Ethics--- plan of care and CODE STATUS discussed with patient's son and POA [name], he request DNR/DNI status  7)HTN--- stable, started on Cardizem 120 mg daily  mostly for rate control, lisinopril 5 mg daily, change Lasix to Monday Wednesday Friday only from daily dosing due to concerns about dehydration given modified oral intake  8)Urinary Retention----patient has required in and out catheterization x 3, Foley catheter placed, patient was started on Flomax, Myrbetriq was discontinued, she will follow-up with urology in 2 to 3 weeks for reevaluation and voiding trial   8)Recurrent Tachyarrythmia/SVT--- Echo with preserved EF of 60 to 65%, no regional wall motion abnormalities, no further episodes of SVT, magnesium was 2.2, potassium was 4.0, okay to discharge home on Cardizem CD 120 mg  Daily,  9)Dysphagia--swallowing concerns, speech pathologist evaluation appreciated, they recommend ----  dysphagia 2 (Fine chop solids) with  Nectar-thick liquid feeding and swallowing recommendations as above  and as below--- Liquid Administration via: Cup Medication Administration: Whole meds with liquid Supervision: Family to assist with self feeding;Full supervision/cueing for compensatory strategies Compensations: Slow rate;Small sips/bites Postural Changes: Seated upright at 90 degrees;Remain  upright for at least 30 minutes after oral intake    Disposition---- Home with home health   code Status : DNR/DNI  Family Communication:   D/w her son--  [name] [phone]  [phone]   Disposition Plan  : PT recommends SNF rehab , family declines SNF rehab, POA/son wants discharge home with home health  Consults  :  na  Discharge Condition: stable... Hypoxia resolved  Follow UP--- urology in 2 to 3 weeks for voiding trial  Diet and Activity recommendation:  As advised  Discharge Instructions     Discharge Instructions    Call MD for:  difficulty breathing, headache or visual disturbances   Complete by: As directed    Call MD for:  persistant dizziness or light-headedness   Complete by: As directed    Call MD for:  persistant nausea  and vomiting   Complete by: As directed    Call MD for:  severe uncontrolled pain   Complete by: As directed    Call MD for:  temperature >100.4   Complete by: As directed    Diet - low sodium heart healthy   Complete by: As directed    Dysphagia 2 (Fine chop solids);  Nectar-thick liquid  feeding and swallowing recommendations as above and as below--- Liquid Administration via: Cup Medication Administration: Whole meds with liquid Supervision: Family to assist with self feeding;Full supervision/cueing for compensatory strategies Compensations: Slow rate;Small sips/bites Postural Changes: Seated upright at 90 degrees;Remain upright for at least 30 minutes after oral intake   Dysphagia 2 (Fine chop solids);  Nectar-thick liquid  feeding and swallowing recommendations as above and as below--- Liquid Administration via: Cup Medication Administration: Whole meds with liquid Supervision: Family to assist with self feeding;Full supervision/cueing for compensatory strategies Compensations: Slow rate;Small sips/bites Postural Changes: Seated upright at 90 degrees;Remain upright for at least 30 minutes after oral intake   Discharge instructions   Complete by: As directed    1) keep Foley catheter in due to urinary retention--- follow-up with Dr. Lovena Neighbours or 1 of his partners at the urology clinic in [place] in about 3 weeks possible removal of the Foley catheter 2) take medications as prescribed 3)Dysphagia 2 (Fine chop solids);  Nectar-thick liquid 4) feeding and swallowing recommendations as above #3 and as below--- Liquid Administration via: Cup Medication Administration: Whole meds with liquid Supervision: Family to assist with self feeding;Full supervision/cueing for compensatory strategies Compensations: Slow rate;Small sips/bites Postural Changes: Seated upright at 90 degrees;Remain upright for at least 30 minutes after oral intake   For home use only DME Walker   Complete by: As  directed    Patient needs a walker to treat with the following condition: Gait disturbance   Walk with assistance   Complete by: As directed    Fall precautions   Walker rolling   Complete by: As directed    Balance and gait disturbance and falls        Discharge Medications     Allergies as of 02/14/2019      Reactions   Azithromycin    Cephalexin    Causes her to shake   Ciprofloxacin Nausea And Vomiting, Other (See Comments)   jittery   Codeine    REACTION: shakiness   Contrast Media [iodinated Diagnostic Agents]    Latex    Nitrofuran Derivatives    Nsaids    Nutritional Supplements    Penicillins    REACTION: shakiness   Phenazopyridine    Quinolones  Sulfa Antibiotics    Toviaz [fesoterodine Fumarate Er]    Trimethoprim       Medication List    STOP taking these medications   busPIRone 15 MG tablet Commonly known as: BUSPAR   mirabegron ER 50 MG Tb24 tablet Commonly known as: Myrbetriq   nitroGLYCERIN 0.4 MG SL tablet Commonly known as: NITROSTAT     TAKE these medications   acetaminophen 325 MG tablet Commonly known as: TYLENOL Take 2 tablets (650 mg total) by mouth every 6 (six) hours as needed for mild pain or fever (or Fever >/= 101).   atorvastatin 20 MG tablet Commonly known as: LIPITOR Take 1 tablet (20 mg total) by mouth daily.   Calcium Citrate-Vitamin D 200-250 MG-UNIT Tabs Commonly known as: Citracal Petites/Vitamin D Take 1 tablet by mouth 2 (two) times daily.   cefdinir 300 MG capsule Commonly known as: OMNICEF Take 1 capsule (300 mg total) by mouth 2 (two) times daily for 5 days.   clopidogrel 75 MG tablet Commonly known as: PLAVIX Take 1 tablet (75 mg total) by mouth daily.   diltiazem 120 MG 24 hr capsule Commonly known as: Cardizem CD Take 1 capsule (120 mg total) by mouth daily.   donepezil 10 MG tablet Commonly known as: ARICEPT Take 1 tablet (10 mg total) by mouth at bedtime.   doxycycline 100 MG  tablet Commonly known as: VIBRA-TABS Take 1 tablet (100 mg total) by mouth 2 (two) times daily for 5 days.   furosemide 20 MG tablet Commonly known as: LASIX Take 1 tablet (20 mg total) by mouth every Monday, Wednesday, and Friday. What changed: when to take this   guaiFENesin 100 MG/5ML liquid Commonly known as: ROBITUSSIN Take 20 mLs (400 mg total) by mouth 3 (three) times daily as needed for cough or congestion.   isosorbide mononitrate 30 MG 24 hr tablet Commonly known as: IMDUR Take 1 tablet (30 mg total) by mouth daily. Start taking on: February 15, 2019 What changed: when to take this   lisinopril 5 MG tablet Commonly known as: ZESTRIL Take 1 tablet (5 mg total) by mouth daily.   memantine 5 MG tablet Commonly known as: NAMENDA Take 1 tablet (5 mg total) by mouth 2 (two) times daily for 30 days.   nystatin cream Commonly known as: MYCOSTATIN Apply 1 application topically 2 (two) times daily.   ondansetron 4 MG disintegrating tablet Commonly known as: ZOFRAN-ODT Take 1 tablet (4 mg total) by mouth every 6 (six) hours as needed for nausea or vomiting.   polyethylene glycol 17 g packet Commonly known as: MIRALAX / GLYCOLAX Take 17 g by mouth daily as needed for mild constipation.   risperiDONE 0.25 MG tablet Commonly known as: RISPERDAL Take 1 tablet (0.25 mg total) by mouth 2 (two) times daily as needed ([place]). What changed:   medication strength  how much to take  how to take this  when to take this  reasons to take this  additional instructions  Another medication with the same name was removed. Continue taking this medication, and follow the directions you see here.   tamsulosin 0.4 MG Caps capsule Commonly known as: Flomax Take 1 capsule (0.4 mg total) by mouth daily after supper.   zoledronic acid 5 MG/100ML Soln injection Commonly known as: RECLAST Inject 5 mg into the vein PRO. Yearly x 5            Durable Medical Equipment  (From  admission, onward)  Start     Ordered   02/14/19 0000  For home use only DME Walker    Question:  Patient needs a walker to treat with the following condition  Answer:  Gait disturbance   02/14/19 1519          Major procedures and Radiology Reports - PLEASE review detailed and final reports for all details, in brief -    Dg Chest Port 1 View  Result Date: 02/11/2019 CLINICAL DATA:  Shortness of breath.  Cough and fever. EXAM: PORTABLE CHEST 1 VIEW COMPARISON:  One-view chest x-ray 02/10/2019. FINDINGS: The heart size is normal. Lung volumes are low. Right lower lobe airspace opacity is again noted. No other focal consolidation is present. A small right effusion is suspected. IMPRESSION: 1. Asymmetric right lower lobe airspace disease and probable effusion. This is concerning for early infection. Electronically Signed   By: San Morelle M.D.   On: 02/11/2019 06:04   Dg Chest Port 1 View  Result Date: 02/10/2019 CLINICAL DATA:  Altered mental status EXAM: PORTABLE CHEST 1 VIEW COMPARISON:  05/07/2018 FINDINGS: Right basilar atelectasis. Normal cardiomediastinal contours. No pleural effusion or pneumothorax. IMPRESSION: Right basilar atelectasis. Electronically Signed   By: Ulyses Jarred M.D.   On: 02/10/2019 18:34    Micro Results    Recent Results (from the past 240 hour(s))  Culture, blood (Routine x 2)     Status: None (Preliminary result)   Collection Time: 02/10/19  5:13 PM   Specimen: BLOOD LEFT HAND  Result Value Ref Range Status   Specimen Description BLOOD LEFT HAND  Final   Special Requests   Final    BOTTLES DRAWN AEROBIC ONLY Blood Culture adequate volume   Culture   Final    NO GROWTH 3 DAYS Performed at [facility], [address], [place], [place] [address]    Report Status PENDING  Incomplete  SARS Coronavirus 2 (CEPHEID- Performed in [place] hospital lab), Hosp Order     Status: None   Collection Time: 02/10/19  5:19 PM   Specimen:  Nasopharyngeal Swab  Result Value Ref Range Status   SARS Coronavirus 2 NEGATIVE NEGATIVE Final    Comment: (NOTE) If result is NEGATIVE SARS-CoV-2 target nucleic acids are NOT DETECTED. The SARS-CoV-2 RNA is generally detectable in upper and lower  respiratory specimens during the acute phase of infection. The lowest  concentration of SARS-CoV-2 viral copies this assay can detect is 250  copies / mL. A negative result does not preclude SARS-CoV-2 infection  and should not be used as the sole basis for treatment or other  patient management decisions.  A negative result may occur with  improper specimen collection / handling, submission of specimen other  than nasopharyngeal swab, presence of viral mutation(s) within the  areas targeted by this assay, and inadequate number of viral copies  (<250 copies / mL). A negative result must be combined with clinical  observations, patient history, and epidemiological information. If result is POSITIVE SARS-CoV-2 target nucleic acids are DETECTED. The SARS-CoV-2 RNA is generally detectable in upper and lower  respiratory specimens dur ing the acute phase of infection.  Positive  results are indicative of active infection with SARS-CoV-2.  Clinical  correlation with patient history and other diagnostic information is  necessary to determine patient infection status.  Positive results do  not rule out bacterial infection or co-infection with other viruses. If result is PRESUMPTIVE POSTIVE SARS-CoV-2 nucleic acids MAY BE PRESENT.   A presumptive  positive result was obtained on the submitted specimen  and confirmed on repeat testing.  While 2019 novel coronavirus  (SARS-CoV-2) nucleic acids may be present in the submitted sample  additional confirmatory testing may be necessary for epidemiological  and / or clinical management purposes  to differentiate between  SARS-CoV-2 and other Sarbecovirus currently known to infect humans.  If clinically  indicated additional testing with an alternate test  methodology [phone]) is advised. The SARS-CoV-2 RNA is generally  detectable in upper and lower respiratory sp ecimens during the acute  phase of infection. The expected result is Negative. Fact Sheet for Patients:  StrictlyIdeas.no Fact Sheet for Healthcare Providers: BankingDealers.co.za This test is not yet approved or cleared by the [place] FDA and has been authorized for detection and/or diagnosis of SARS-CoV-2 by FDA under an Emergency Use Authorization (EUA).  This EUA will remain in effect (meaning this test can be used) for the duration of the COVID-19 declaration under Section 564(b)(1) of the Act, 21 U.S.C. section 360bbb-3(b)(1), unless the authorization is terminated or revoked sooner. Performed at [facility], [address] [place], [place] [address]   Culture, blood (Routine x 2)     Status: None (Preliminary result)   Collection Time: 02/10/19  5:49 PM   Specimen: Right Antecubital; Blood  Result Value Ref Range Status   Specimen Description RIGHT ANTECUBITAL  Final   Special Requests   Final    BOTTLES DRAWN AEROBIC AND ANAEROBIC Blood Culture adequate volume   Culture   Final    NO GROWTH 3 DAYS Performed at [facility], [address], [place], [place] [address]    Report Status PENDING  Incomplete  Culture, Urine     Status: None   Collection Time: 02/10/19 11:30 PM   Specimen: Urine, Random  Result Value Ref Range Status   Specimen Description   Final    URINE, RANDOM Performed at [facility], [address], [place], [place] [address]    Special Requests   Final    NONE Performed at [facility], [address] [place], [place] [address]    Culture   Final    NO GROWTH Performed at [facility], [place] [address] [place], [place] [address]    Report Status 02/12/2019 FINAL  Final       Today   Subjective    Henriette Hesser today  has no new concerns, O2 sats on room air is 98%, oral intake is fair,  Had urinary retention, Foley was placed overnight        Patient has been seen and examined prior to discharge   Objective   Blood pressure 107/77, pulse 63, temperature 98.2 F (36.8 C), temperature source Oral, resp. rate 18, height '5\' 3"'  (1.6 m), weight 59.7 kg, SpO2 98 %.   Intake/Output Summary (Last 24 hours) at 02/14/2019 1552 Last data filed at 02/14/2019 1320 Gross per 24 hour  Intake 340 ml  Output 600 ml  Net -260 ml    Exam Gen:- Awake Alert, in no acute distress HEENT:- .AT, No sclera icterus Neck-Supple Neck,No JVD,.  Lungs-improved air movement,  no wheezing  CV- S1, S2 normal, regular  Abd-  +ve B.Sounds, Abd Soft, No tenderness,    Extremity/Skin:- No  edema, pedal pulses present  Psych-affect is appropriate, disoriented with cognitive deficits but overall cooperative  neuro-generalized weakness, but no new focal deficits, no tremors GU--Foley catheter in situ   Data Review   CBC w Diff:  Lab Results  Component Value Date   WBC 11.5 (H) 02/14/2019   HGB 10.3 (L) 02/14/2019   HGB 12.3 11/03/2018   HCT 33.1 (L) 02/14/2019   HCT 38.2 11/03/2018   PLT 244 02/14/2019   PLT 250 11/03/2018   LYMPHOPCT 8 02/10/2019   MONOPCT 9 02/10/2019   EOSPCT 4 02/10/2019   BASOPCT 0 02/10/2019    CMP:  Lab Results  Component Value Date   NA 143 02/13/2019   NA 143 11/03/2018   K 4.0 02/13/2019   CL 114 (H) 02/13/2019   CO2 24 02/13/2019   BUN 16 02/13/2019   BUN 14 11/03/2018   CREATININE 0.70 02/13/2019   PROT 6.5 02/10/2019   PROT 6.3 11/03/2018   ALBUMIN 3.1 (L) 02/10/2019   ALBUMIN 4.1 11/03/2018   BILITOT 0.9 02/10/2019   BILITOT 0.8 11/03/2018   ALKPHOS 70 02/10/2019   AST 19 02/10/2019   ALT 14 02/10/2019  .   Total Discharge time is about 33 minutes  Roxan Hockey M.D on 02/14/2019 at 3:52 PM  Go to [email or web address] -  for contact info  [facility] - Office   [phone]

## 2019-02-14 NOTE — Plan of Care (Signed)

## 2019-02-15 DIAGNOSIS — R1310 Dysphagia, unspecified: Secondary | ICD-10-CM | POA: Diagnosis not present

## 2019-02-15 DIAGNOSIS — L989 Disorder of the skin and subcutaneous tissue, unspecified: Secondary | ICD-10-CM | POA: Diagnosis not present

## 2019-02-15 DIAGNOSIS — I10 Essential (primary) hypertension: Secondary | ICD-10-CM | POA: Diagnosis not present

## 2019-02-15 DIAGNOSIS — I2510 Atherosclerotic heart disease of native coronary artery without angina pectoris: Secondary | ICD-10-CM | POA: Diagnosis not present

## 2019-02-15 DIAGNOSIS — J440 Chronic obstructive pulmonary disease with acute lower respiratory infection: Secondary | ICD-10-CM | POA: Diagnosis not present

## 2019-02-15 DIAGNOSIS — J9621 Acute and chronic respiratory failure with hypoxia: Secondary | ICD-10-CM | POA: Diagnosis not present

## 2019-02-15 DIAGNOSIS — F0390 Unspecified dementia without behavioral disturbance: Secondary | ICD-10-CM | POA: Diagnosis not present

## 2019-02-15 DIAGNOSIS — R339 Retention of urine, unspecified: Secondary | ICD-10-CM | POA: Diagnosis not present

## 2019-02-15 DIAGNOSIS — J181 Lobar pneumonia, unspecified organism: Secondary | ICD-10-CM | POA: Diagnosis not present

## 2019-02-15 LAB — CULTURE, BLOOD (ROUTINE X 2)
Culture: NO GROWTH
Culture: NO GROWTH
Special Requests: ADEQUATE
Special Requests: ADEQUATE

## 2019-02-16 ENCOUNTER — Telehealth: Payer: Self-pay | Admitting: Family Medicine

## 2019-02-16 DIAGNOSIS — L989 Disorder of the skin and subcutaneous tissue, unspecified: Secondary | ICD-10-CM | POA: Diagnosis not present

## 2019-02-16 DIAGNOSIS — F0390 Unspecified dementia without behavioral disturbance: Secondary | ICD-10-CM | POA: Diagnosis not present

## 2019-02-16 DIAGNOSIS — R339 Retention of urine, unspecified: Secondary | ICD-10-CM | POA: Diagnosis not present

## 2019-02-16 DIAGNOSIS — J440 Chronic obstructive pulmonary disease with acute lower respiratory infection: Secondary | ICD-10-CM | POA: Diagnosis not present

## 2019-02-16 DIAGNOSIS — J181 Lobar pneumonia, unspecified organism: Secondary | ICD-10-CM | POA: Diagnosis not present

## 2019-02-16 DIAGNOSIS — I10 Essential (primary) hypertension: Secondary | ICD-10-CM | POA: Diagnosis not present

## 2019-02-16 DIAGNOSIS — I2510 Atherosclerotic heart disease of native coronary artery without angina pectoris: Secondary | ICD-10-CM | POA: Diagnosis not present

## 2019-02-16 DIAGNOSIS — R1310 Dysphagia, unspecified: Secondary | ICD-10-CM | POA: Diagnosis not present

## 2019-02-16 DIAGNOSIS — J9621 Acute and chronic respiratory failure with hypoxia: Secondary | ICD-10-CM | POA: Diagnosis not present

## 2019-02-17 ENCOUNTER — Ambulatory Visit: Payer: Medicare HMO | Admitting: Family Medicine

## 2019-02-18 DIAGNOSIS — I10 Essential (primary) hypertension: Secondary | ICD-10-CM | POA: Diagnosis not present

## 2019-02-18 DIAGNOSIS — R339 Retention of urine, unspecified: Secondary | ICD-10-CM | POA: Diagnosis not present

## 2019-02-18 DIAGNOSIS — L989 Disorder of the skin and subcutaneous tissue, unspecified: Secondary | ICD-10-CM | POA: Diagnosis not present

## 2019-02-18 DIAGNOSIS — J9621 Acute and chronic respiratory failure with hypoxia: Secondary | ICD-10-CM | POA: Diagnosis not present

## 2019-02-18 DIAGNOSIS — J181 Lobar pneumonia, unspecified organism: Secondary | ICD-10-CM | POA: Diagnosis not present

## 2019-02-18 DIAGNOSIS — R1310 Dysphagia, unspecified: Secondary | ICD-10-CM | POA: Diagnosis not present

## 2019-02-18 DIAGNOSIS — I2510 Atherosclerotic heart disease of native coronary artery without angina pectoris: Secondary | ICD-10-CM | POA: Diagnosis not present

## 2019-02-18 DIAGNOSIS — J440 Chronic obstructive pulmonary disease with acute lower respiratory infection: Secondary | ICD-10-CM | POA: Diagnosis not present

## 2019-02-18 DIAGNOSIS — F0390 Unspecified dementia without behavioral disturbance: Secondary | ICD-10-CM | POA: Diagnosis not present

## 2019-02-21 DIAGNOSIS — J181 Lobar pneumonia, unspecified organism: Secondary | ICD-10-CM | POA: Diagnosis not present

## 2019-02-21 DIAGNOSIS — R1310 Dysphagia, unspecified: Secondary | ICD-10-CM | POA: Diagnosis not present

## 2019-02-21 DIAGNOSIS — J9621 Acute and chronic respiratory failure with hypoxia: Secondary | ICD-10-CM | POA: Diagnosis not present

## 2019-02-21 DIAGNOSIS — I10 Essential (primary) hypertension: Secondary | ICD-10-CM | POA: Diagnosis not present

## 2019-02-21 DIAGNOSIS — J440 Chronic obstructive pulmonary disease with acute lower respiratory infection: Secondary | ICD-10-CM | POA: Diagnosis not present

## 2019-02-21 DIAGNOSIS — F0390 Unspecified dementia without behavioral disturbance: Secondary | ICD-10-CM | POA: Diagnosis not present

## 2019-02-21 DIAGNOSIS — I2510 Atherosclerotic heart disease of native coronary artery without angina pectoris: Secondary | ICD-10-CM | POA: Diagnosis not present

## 2019-02-21 DIAGNOSIS — L989 Disorder of the skin and subcutaneous tissue, unspecified: Secondary | ICD-10-CM | POA: Diagnosis not present

## 2019-02-21 DIAGNOSIS — R339 Retention of urine, unspecified: Secondary | ICD-10-CM | POA: Diagnosis not present

## 2019-02-22 DIAGNOSIS — J181 Lobar pneumonia, unspecified organism: Secondary | ICD-10-CM | POA: Diagnosis not present

## 2019-02-22 DIAGNOSIS — L989 Disorder of the skin and subcutaneous tissue, unspecified: Secondary | ICD-10-CM | POA: Diagnosis not present

## 2019-02-22 DIAGNOSIS — I10 Essential (primary) hypertension: Secondary | ICD-10-CM | POA: Diagnosis not present

## 2019-02-22 DIAGNOSIS — R339 Retention of urine, unspecified: Secondary | ICD-10-CM | POA: Diagnosis not present

## 2019-02-22 DIAGNOSIS — I2510 Atherosclerotic heart disease of native coronary artery without angina pectoris: Secondary | ICD-10-CM | POA: Diagnosis not present

## 2019-02-22 DIAGNOSIS — J440 Chronic obstructive pulmonary disease with acute lower respiratory infection: Secondary | ICD-10-CM | POA: Diagnosis not present

## 2019-02-22 DIAGNOSIS — J9621 Acute and chronic respiratory failure with hypoxia: Secondary | ICD-10-CM | POA: Diagnosis not present

## 2019-02-22 DIAGNOSIS — R1310 Dysphagia, unspecified: Secondary | ICD-10-CM | POA: Diagnosis not present

## 2019-02-22 DIAGNOSIS — F0390 Unspecified dementia without behavioral disturbance: Secondary | ICD-10-CM | POA: Diagnosis not present

## 2019-02-23 DIAGNOSIS — F0390 Unspecified dementia without behavioral disturbance: Secondary | ICD-10-CM | POA: Diagnosis not present

## 2019-02-23 DIAGNOSIS — I2510 Atherosclerotic heart disease of native coronary artery without angina pectoris: Secondary | ICD-10-CM | POA: Diagnosis not present

## 2019-02-23 DIAGNOSIS — R339 Retention of urine, unspecified: Secondary | ICD-10-CM | POA: Diagnosis not present

## 2019-02-23 DIAGNOSIS — I10 Essential (primary) hypertension: Secondary | ICD-10-CM | POA: Diagnosis not present

## 2019-02-23 DIAGNOSIS — J181 Lobar pneumonia, unspecified organism: Secondary | ICD-10-CM | POA: Diagnosis not present

## 2019-02-23 DIAGNOSIS — R1310 Dysphagia, unspecified: Secondary | ICD-10-CM | POA: Diagnosis not present

## 2019-02-23 DIAGNOSIS — J440 Chronic obstructive pulmonary disease with acute lower respiratory infection: Secondary | ICD-10-CM | POA: Diagnosis not present

## 2019-02-23 DIAGNOSIS — L989 Disorder of the skin and subcutaneous tissue, unspecified: Secondary | ICD-10-CM | POA: Diagnosis not present

## 2019-02-23 DIAGNOSIS — J9621 Acute and chronic respiratory failure with hypoxia: Secondary | ICD-10-CM | POA: Diagnosis not present

## 2019-02-24 DIAGNOSIS — R339 Retention of urine, unspecified: Secondary | ICD-10-CM | POA: Diagnosis not present

## 2019-02-24 DIAGNOSIS — F0390 Unspecified dementia without behavioral disturbance: Secondary | ICD-10-CM | POA: Diagnosis not present

## 2019-02-24 DIAGNOSIS — J9621 Acute and chronic respiratory failure with hypoxia: Secondary | ICD-10-CM | POA: Diagnosis not present

## 2019-02-24 DIAGNOSIS — J181 Lobar pneumonia, unspecified organism: Secondary | ICD-10-CM | POA: Diagnosis not present

## 2019-02-24 DIAGNOSIS — I10 Essential (primary) hypertension: Secondary | ICD-10-CM | POA: Diagnosis not present

## 2019-02-24 DIAGNOSIS — L989 Disorder of the skin and subcutaneous tissue, unspecified: Secondary | ICD-10-CM | POA: Diagnosis not present

## 2019-02-24 DIAGNOSIS — R1310 Dysphagia, unspecified: Secondary | ICD-10-CM | POA: Diagnosis not present

## 2019-02-24 DIAGNOSIS — I2510 Atherosclerotic heart disease of native coronary artery without angina pectoris: Secondary | ICD-10-CM | POA: Diagnosis not present

## 2019-02-24 DIAGNOSIS — J440 Chronic obstructive pulmonary disease with acute lower respiratory infection: Secondary | ICD-10-CM | POA: Diagnosis not present

## 2019-02-25 ENCOUNTER — Telehealth: Payer: Self-pay | Admitting: *Deleted

## 2019-02-25 DIAGNOSIS — I10 Essential (primary) hypertension: Secondary | ICD-10-CM | POA: Diagnosis not present

## 2019-02-25 DIAGNOSIS — R1310 Dysphagia, unspecified: Secondary | ICD-10-CM | POA: Diagnosis not present

## 2019-02-25 DIAGNOSIS — F0390 Unspecified dementia without behavioral disturbance: Secondary | ICD-10-CM | POA: Diagnosis not present

## 2019-02-25 DIAGNOSIS — I2510 Atherosclerotic heart disease of native coronary artery without angina pectoris: Secondary | ICD-10-CM | POA: Diagnosis not present

## 2019-02-25 DIAGNOSIS — J9621 Acute and chronic respiratory failure with hypoxia: Secondary | ICD-10-CM | POA: Diagnosis not present

## 2019-02-25 DIAGNOSIS — J440 Chronic obstructive pulmonary disease with acute lower respiratory infection: Secondary | ICD-10-CM | POA: Diagnosis not present

## 2019-02-25 DIAGNOSIS — R339 Retention of urine, unspecified: Secondary | ICD-10-CM | POA: Diagnosis not present

## 2019-02-25 DIAGNOSIS — L989 Disorder of the skin and subcutaneous tissue, unspecified: Secondary | ICD-10-CM | POA: Diagnosis not present

## 2019-02-25 DIAGNOSIS — J181 Lobar pneumonia, unspecified organism: Secondary | ICD-10-CM | POA: Diagnosis not present

## 2019-02-25 NOTE — Telephone Encounter (Signed)
Ok to order catheter bag and changes of catheter bag as needed.

## 2019-02-25 NOTE — Telephone Encounter (Signed)
Order for catheter bag and changes PRN  - verbal ok given  ( per rakes )   Please approve by documentation.

## 2019-02-27 DIAGNOSIS — R531 Weakness: Secondary | ICD-10-CM | POA: Diagnosis not present

## 2019-02-27 DIAGNOSIS — F0391 Unspecified dementia with behavioral disturbance: Secondary | ICD-10-CM | POA: Diagnosis not present

## 2019-02-27 DIAGNOSIS — S329XXA Fracture of unspecified parts of lumbosacral spine and pelvis, initial encounter for closed fracture: Secondary | ICD-10-CM | POA: Diagnosis not present

## 2019-03-01 ENCOUNTER — Ambulatory Visit: Payer: Self-pay | Admitting: Licensed Clinical Social Worker

## 2019-03-01 DIAGNOSIS — Z9181 History of falling: Secondary | ICD-10-CM

## 2019-03-01 DIAGNOSIS — L989 Disorder of the skin and subcutaneous tissue, unspecified: Secondary | ICD-10-CM | POA: Diagnosis not present

## 2019-03-01 DIAGNOSIS — F0281 Dementia in other diseases classified elsewhere with behavioral disturbance: Secondary | ICD-10-CM

## 2019-03-01 DIAGNOSIS — F419 Anxiety disorder, unspecified: Secondary | ICD-10-CM

## 2019-03-01 DIAGNOSIS — J440 Chronic obstructive pulmonary disease with acute lower respiratory infection: Secondary | ICD-10-CM | POA: Diagnosis not present

## 2019-03-01 DIAGNOSIS — R531 Weakness: Secondary | ICD-10-CM

## 2019-03-01 DIAGNOSIS — J181 Lobar pneumonia, unspecified organism: Secondary | ICD-10-CM | POA: Diagnosis not present

## 2019-03-01 DIAGNOSIS — I10 Essential (primary) hypertension: Secondary | ICD-10-CM

## 2019-03-01 DIAGNOSIS — F0390 Unspecified dementia without behavioral disturbance: Secondary | ICD-10-CM | POA: Diagnosis not present

## 2019-03-01 DIAGNOSIS — F02818 Dementia in other diseases classified elsewhere, unspecified severity, with other behavioral disturbance: Secondary | ICD-10-CM

## 2019-03-01 DIAGNOSIS — J9621 Acute and chronic respiratory failure with hypoxia: Secondary | ICD-10-CM | POA: Diagnosis not present

## 2019-03-01 DIAGNOSIS — R1310 Dysphagia, unspecified: Secondary | ICD-10-CM | POA: Diagnosis not present

## 2019-03-01 DIAGNOSIS — J410 Simple chronic bronchitis: Secondary | ICD-10-CM

## 2019-03-01 DIAGNOSIS — R339 Retention of urine, unspecified: Secondary | ICD-10-CM | POA: Diagnosis not present

## 2019-03-01 DIAGNOSIS — I2510 Atherosclerotic heart disease of native coronary artery without angina pectoris: Secondary | ICD-10-CM | POA: Diagnosis not present

## 2019-03-01 NOTE — Chronic Care Management (AMB) (Signed)
  Care Management Note   [name] is a 85 y.o. year old female who is a primary care patient of [name], [name], FNP. The CM team was consulted for assistance with chronic disease management and care coordination.   I reached out to [name], [name] by phone today.   Review of patient status, including review of consultants reports, relevant laboratory and other test results, and collaboration with appropriate care team members and the patient's provider was performed as part of comprehensive patient evaluation and provision of chronic care management services.    Social Determinants of Health: Risk for Depression; Risk for Social Isolation    Chronic Care Management from 12/24/2018 in [place]  PHQ-9 Total Score  10     Goals    . Client to communicate with LCSW in next 30 days to discuss health needs management of client (pt-stated)     Current Barriers:  . ADL IADL limitations  . Weakness, fall potential . Needs help with ADLs  Clinical Social Work Clinical Goal(s):  Marland Kitchen Over the next 30 days, client/[name], son of client  will work with LCSW to address concerns related to health needs management for client  . Interventions: . Previously discussed health care needs of client . Previously encouraged client or son to talk with RN CM to discuss nursing needs of client . Previously encouraged client/son to communicate as needed with Baruch Gouty, FNP regarding medical needs of client . Encouraged client/son of client to call LCSW as needed to talk about psychosocial needs of client.   Patient Self Care Activities:  . Attends all scheduled provider appointments  Plan:   Client to attend scheduled medical appointments Client/son, [name], to communicate with RN CM as needed to discuss nursing needs of client LCSW to call client/son, [name], in next 3 weeks to discuss with client/son the management of health needs of client   Initial goal documentation    Follow Up Plan: LCSW to call client/ son, [name], in next 3 weeks to discuss with client/son the management of health needs of client  Norva Riffle.Lilienne Weins MSW, LCSW Licensed Clinical Social Worker [facility] Care Management [phone]

## 2019-03-01 NOTE — Patient Instructions (Signed)
Licensed Clinical Social Worker Visit Information  Goals we discussed today:  Goals        . Client to communicate with LCSW in next 30 days to discuss health needs management of client (pt-stated)     Current Barriers:  . ADL IADL limitations  . Weakness, fall potential . Needs help with ADLs  Clinical Social Work Clinical Goal(s):  Marland Kitchen Over the next 30 days, [name], son of client  will work with LCSW to address concerns related to health needs management for client  . Interventions: . Previously discussed health care needs of client . Previously encouraged client or son to talk with RN CM to discuss nursing needs of client . Previously encouraged client/son to communicate as needed with Baruch Gouty, FNP regarding medical needs of client . Encouraged client/son of client to call LCSW as needed to talk about psychosocial needs of client.   Patient Self Care Activities:  . Attends all scheduled provider appointments  Plan:   Client to attend scheduled medical appointments Client/son, [name], to communicate with RN CM as needed to discuss nursing needs of client LCSW to call client/son, [name], in next 3 weeks to discuss with client/son the management of health needs of client  Initial goal documentation     Materials Provided: No  Follow Up Plan: LCSW to call client or son, [name], in next 3 weeks to discuss with client or son the management of health needs of client  The patient/son, [name] verbalized understanding of instructions provided today and declined a print copy of patient instruction materials.   Norva Riffle.Juris Gosnell MSW, LCSW Licensed Clinical Social Worker [facility] [phone]

## 2019-03-02 ENCOUNTER — Telehealth: Payer: Self-pay | Admitting: Family Medicine

## 2019-03-02 DIAGNOSIS — J440 Chronic obstructive pulmonary disease with acute lower respiratory infection: Secondary | ICD-10-CM | POA: Diagnosis not present

## 2019-03-02 DIAGNOSIS — I2510 Atherosclerotic heart disease of native coronary artery without angina pectoris: Secondary | ICD-10-CM | POA: Diagnosis not present

## 2019-03-02 DIAGNOSIS — J181 Lobar pneumonia, unspecified organism: Secondary | ICD-10-CM | POA: Diagnosis not present

## 2019-03-02 DIAGNOSIS — L989 Disorder of the skin and subcutaneous tissue, unspecified: Secondary | ICD-10-CM | POA: Diagnosis not present

## 2019-03-02 DIAGNOSIS — I10 Essential (primary) hypertension: Secondary | ICD-10-CM | POA: Diagnosis not present

## 2019-03-02 DIAGNOSIS — R1310 Dysphagia, unspecified: Secondary | ICD-10-CM | POA: Diagnosis not present

## 2019-03-02 DIAGNOSIS — F0390 Unspecified dementia without behavioral disturbance: Secondary | ICD-10-CM | POA: Diagnosis not present

## 2019-03-02 DIAGNOSIS — J9621 Acute and chronic respiratory failure with hypoxia: Secondary | ICD-10-CM | POA: Diagnosis not present

## 2019-03-02 DIAGNOSIS — R339 Retention of urine, unspecified: Secondary | ICD-10-CM | POA: Diagnosis not present

## 2019-03-02 NOTE — Telephone Encounter (Signed)
[facility] calling stating she has a new un stagable Left heel recommend skin prep. Also stage 2 right ankle and stage 2 Buttocks. They recommend hydrocolloid. Patient also has Foley cath, but they recommend that stays in right now due to her incontinence. Please advise if you agree to these verbal orders

## 2019-03-02 NOTE — Telephone Encounter (Signed)
Please approve as recommended. Thanks, WS

## 2019-03-02 NOTE — Telephone Encounter (Signed)
Angie notified with [facility]

## 2019-03-02 NOTE — Telephone Encounter (Signed)
Verbal order given  

## 2019-03-03 ENCOUNTER — Ambulatory Visit (INDEPENDENT_AMBULATORY_CARE_PROVIDER_SITE_OTHER): Payer: Medicare HMO

## 2019-03-03 ENCOUNTER — Other Ambulatory Visit: Payer: Self-pay

## 2019-03-03 DIAGNOSIS — J181 Lobar pneumonia, unspecified organism: Secondary | ICD-10-CM | POA: Diagnosis not present

## 2019-03-03 DIAGNOSIS — Z7902 Long term (current) use of antithrombotics/antiplatelets: Secondary | ICD-10-CM

## 2019-03-03 DIAGNOSIS — J9621 Acute and chronic respiratory failure with hypoxia: Secondary | ICD-10-CM | POA: Diagnosis not present

## 2019-03-03 DIAGNOSIS — Z960 Presence of urogenital implants: Secondary | ICD-10-CM

## 2019-03-03 DIAGNOSIS — I10 Essential (primary) hypertension: Secondary | ICD-10-CM

## 2019-03-03 DIAGNOSIS — L989 Disorder of the skin and subcutaneous tissue, unspecified: Secondary | ICD-10-CM | POA: Diagnosis not present

## 2019-03-03 DIAGNOSIS — I2510 Atherosclerotic heart disease of native coronary artery without angina pectoris: Secondary | ICD-10-CM | POA: Diagnosis not present

## 2019-03-03 DIAGNOSIS — J440 Chronic obstructive pulmonary disease with acute lower respiratory infection: Secondary | ICD-10-CM | POA: Diagnosis not present

## 2019-03-03 DIAGNOSIS — F0390 Unspecified dementia without behavioral disturbance: Secondary | ICD-10-CM

## 2019-03-03 DIAGNOSIS — R1310 Dysphagia, unspecified: Secondary | ICD-10-CM

## 2019-03-03 DIAGNOSIS — R339 Retention of urine, unspecified: Secondary | ICD-10-CM

## 2019-03-03 DIAGNOSIS — Z953 Presence of xenogenic heart valve: Secondary | ICD-10-CM

## 2019-03-04 ENCOUNTER — Telehealth: Payer: Medicare HMO

## 2019-03-04 DIAGNOSIS — F0390 Unspecified dementia without behavioral disturbance: Secondary | ICD-10-CM | POA: Diagnosis not present

## 2019-03-08 DIAGNOSIS — R339 Retention of urine, unspecified: Secondary | ICD-10-CM | POA: Diagnosis not present

## 2019-03-08 DIAGNOSIS — I2510 Atherosclerotic heart disease of native coronary artery without angina pectoris: Secondary | ICD-10-CM | POA: Diagnosis not present

## 2019-03-08 DIAGNOSIS — L989 Disorder of the skin and subcutaneous tissue, unspecified: Secondary | ICD-10-CM | POA: Diagnosis not present

## 2019-03-08 DIAGNOSIS — J9621 Acute and chronic respiratory failure with hypoxia: Secondary | ICD-10-CM | POA: Diagnosis not present

## 2019-03-08 DIAGNOSIS — I10 Essential (primary) hypertension: Secondary | ICD-10-CM | POA: Diagnosis not present

## 2019-03-08 DIAGNOSIS — F0390 Unspecified dementia without behavioral disturbance: Secondary | ICD-10-CM | POA: Diagnosis not present

## 2019-03-08 DIAGNOSIS — J181 Lobar pneumonia, unspecified organism: Secondary | ICD-10-CM | POA: Diagnosis not present

## 2019-03-08 DIAGNOSIS — R1310 Dysphagia, unspecified: Secondary | ICD-10-CM | POA: Diagnosis not present

## 2019-03-08 DIAGNOSIS — J440 Chronic obstructive pulmonary disease with acute lower respiratory infection: Secondary | ICD-10-CM | POA: Diagnosis not present

## 2019-03-09 DIAGNOSIS — J9621 Acute and chronic respiratory failure with hypoxia: Secondary | ICD-10-CM | POA: Diagnosis not present

## 2019-03-09 DIAGNOSIS — L989 Disorder of the skin and subcutaneous tissue, unspecified: Secondary | ICD-10-CM | POA: Diagnosis not present

## 2019-03-09 DIAGNOSIS — R1310 Dysphagia, unspecified: Secondary | ICD-10-CM | POA: Diagnosis not present

## 2019-03-09 DIAGNOSIS — J181 Lobar pneumonia, unspecified organism: Secondary | ICD-10-CM | POA: Diagnosis not present

## 2019-03-09 DIAGNOSIS — I10 Essential (primary) hypertension: Secondary | ICD-10-CM | POA: Diagnosis not present

## 2019-03-09 DIAGNOSIS — F0390 Unspecified dementia without behavioral disturbance: Secondary | ICD-10-CM | POA: Diagnosis not present

## 2019-03-09 DIAGNOSIS — J440 Chronic obstructive pulmonary disease with acute lower respiratory infection: Secondary | ICD-10-CM | POA: Diagnosis not present

## 2019-03-09 DIAGNOSIS — R339 Retention of urine, unspecified: Secondary | ICD-10-CM | POA: Diagnosis not present

## 2019-03-09 DIAGNOSIS — I2510 Atherosclerotic heart disease of native coronary artery without angina pectoris: Secondary | ICD-10-CM | POA: Diagnosis not present

## 2019-03-10 ENCOUNTER — Other Ambulatory Visit: Payer: Self-pay | Admitting: Family Medicine

## 2019-03-14 ENCOUNTER — Other Ambulatory Visit: Payer: Self-pay | Admitting: *Deleted

## 2019-03-14 ENCOUNTER — Telehealth: Payer: Self-pay | Admitting: Family Medicine

## 2019-03-14 ENCOUNTER — Telehealth: Payer: Self-pay | Admitting: *Deleted

## 2019-03-14 DIAGNOSIS — L989 Disorder of the skin and subcutaneous tissue, unspecified: Secondary | ICD-10-CM | POA: Diagnosis not present

## 2019-03-14 DIAGNOSIS — Z9181 History of falling: Secondary | ICD-10-CM

## 2019-03-14 DIAGNOSIS — M7989 Other specified soft tissue disorders: Secondary | ICD-10-CM

## 2019-03-14 DIAGNOSIS — R339 Retention of urine, unspecified: Secondary | ICD-10-CM | POA: Diagnosis not present

## 2019-03-14 DIAGNOSIS — F419 Anxiety disorder, unspecified: Secondary | ICD-10-CM

## 2019-03-14 DIAGNOSIS — R531 Weakness: Secondary | ICD-10-CM

## 2019-03-14 DIAGNOSIS — R54 Age-related physical debility: Secondary | ICD-10-CM

## 2019-03-14 DIAGNOSIS — R1310 Dysphagia, unspecified: Secondary | ICD-10-CM | POA: Diagnosis not present

## 2019-03-14 DIAGNOSIS — R0602 Shortness of breath: Secondary | ICD-10-CM

## 2019-03-14 DIAGNOSIS — F02818 Dementia in other diseases classified elsewhere, unspecified severity, with other behavioral disturbance: Secondary | ICD-10-CM

## 2019-03-14 DIAGNOSIS — J181 Lobar pneumonia, unspecified organism: Secondary | ICD-10-CM | POA: Diagnosis not present

## 2019-03-14 DIAGNOSIS — F0281 Dementia in other diseases classified elsewhere with behavioral disturbance: Secondary | ICD-10-CM

## 2019-03-14 DIAGNOSIS — J9621 Acute and chronic respiratory failure with hypoxia: Secondary | ICD-10-CM | POA: Diagnosis not present

## 2019-03-14 DIAGNOSIS — J440 Chronic obstructive pulmonary disease with acute lower respiratory infection: Secondary | ICD-10-CM | POA: Diagnosis not present

## 2019-03-14 DIAGNOSIS — F0390 Unspecified dementia without behavioral disturbance: Secondary | ICD-10-CM | POA: Diagnosis not present

## 2019-03-14 DIAGNOSIS — I10 Essential (primary) hypertension: Secondary | ICD-10-CM | POA: Diagnosis not present

## 2019-03-14 DIAGNOSIS — I2510 Atherosclerotic heart disease of native coronary artery without angina pectoris: Secondary | ICD-10-CM | POA: Diagnosis not present

## 2019-03-14 NOTE — Telephone Encounter (Signed)
Wheelchair order, demographics, and office notes faxed to [place] at [phone]. [name] aware orders have been placed.

## 2019-03-14 NOTE — Telephone Encounter (Signed)
Verbal order given to Angie with [facility]

## 2019-03-15 ENCOUNTER — Telehealth: Payer: Self-pay | Admitting: Family Medicine

## 2019-03-15 DIAGNOSIS — Z9181 History of falling: Secondary | ICD-10-CM | POA: Diagnosis not present

## 2019-03-15 DIAGNOSIS — R1310 Dysphagia, unspecified: Secondary | ICD-10-CM | POA: Diagnosis not present

## 2019-03-15 DIAGNOSIS — J9621 Acute and chronic respiratory failure with hypoxia: Secondary | ICD-10-CM | POA: Diagnosis not present

## 2019-03-15 DIAGNOSIS — I10 Essential (primary) hypertension: Secondary | ICD-10-CM | POA: Diagnosis not present

## 2019-03-15 DIAGNOSIS — I2510 Atherosclerotic heart disease of native coronary artery without angina pectoris: Secondary | ICD-10-CM | POA: Diagnosis not present

## 2019-03-15 DIAGNOSIS — J440 Chronic obstructive pulmonary disease with acute lower respiratory infection: Secondary | ICD-10-CM | POA: Diagnosis not present

## 2019-03-15 DIAGNOSIS — L989 Disorder of the skin and subcutaneous tissue, unspecified: Secondary | ICD-10-CM | POA: Diagnosis not present

## 2019-03-15 DIAGNOSIS — R531 Weakness: Secondary | ICD-10-CM | POA: Diagnosis not present

## 2019-03-15 DIAGNOSIS — F0390 Unspecified dementia without behavioral disturbance: Secondary | ICD-10-CM | POA: Diagnosis not present

## 2019-03-15 DIAGNOSIS — J181 Lobar pneumonia, unspecified organism: Secondary | ICD-10-CM | POA: Diagnosis not present

## 2019-03-15 DIAGNOSIS — G3183 Dementia with Lewy bodies: Secondary | ICD-10-CM | POA: Diagnosis not present

## 2019-03-15 DIAGNOSIS — R339 Retention of urine, unspecified: Secondary | ICD-10-CM | POA: Diagnosis not present

## 2019-03-15 NOTE — Telephone Encounter (Signed)
Pharmacist aware.

## 2019-03-15 NOTE — Telephone Encounter (Signed)
Pt has Lewy body dementia, generalized weakness, and frequent falls. She is at risk for recurrent falls. She needs a wheelchair in the home to prevent falls.

## 2019-03-15 NOTE — Telephone Encounter (Signed)
Faxed to [facility] [phone]

## 2019-03-15 NOTE — Telephone Encounter (Signed)
This is 2 times daily as needed for agitation

## 2019-03-16 DIAGNOSIS — I10 Essential (primary) hypertension: Secondary | ICD-10-CM | POA: Diagnosis not present

## 2019-03-16 DIAGNOSIS — L989 Disorder of the skin and subcutaneous tissue, unspecified: Secondary | ICD-10-CM | POA: Diagnosis not present

## 2019-03-16 DIAGNOSIS — R1310 Dysphagia, unspecified: Secondary | ICD-10-CM | POA: Diagnosis not present

## 2019-03-16 DIAGNOSIS — J181 Lobar pneumonia, unspecified organism: Secondary | ICD-10-CM | POA: Diagnosis not present

## 2019-03-16 DIAGNOSIS — R339 Retention of urine, unspecified: Secondary | ICD-10-CM | POA: Diagnosis not present

## 2019-03-16 DIAGNOSIS — J440 Chronic obstructive pulmonary disease with acute lower respiratory infection: Secondary | ICD-10-CM | POA: Diagnosis not present

## 2019-03-16 DIAGNOSIS — I2510 Atherosclerotic heart disease of native coronary artery without angina pectoris: Secondary | ICD-10-CM | POA: Diagnosis not present

## 2019-03-16 DIAGNOSIS — F0390 Unspecified dementia without behavioral disturbance: Secondary | ICD-10-CM | POA: Diagnosis not present

## 2019-03-16 DIAGNOSIS — J9621 Acute and chronic respiratory failure with hypoxia: Secondary | ICD-10-CM | POA: Diagnosis not present

## 2019-03-18 DIAGNOSIS — F0390 Unspecified dementia without behavioral disturbance: Secondary | ICD-10-CM | POA: Diagnosis not present

## 2019-03-18 DIAGNOSIS — L989 Disorder of the skin and subcutaneous tissue, unspecified: Secondary | ICD-10-CM | POA: Diagnosis not present

## 2019-03-18 DIAGNOSIS — J9621 Acute and chronic respiratory failure with hypoxia: Secondary | ICD-10-CM | POA: Diagnosis not present

## 2019-03-18 DIAGNOSIS — I2510 Atherosclerotic heart disease of native coronary artery without angina pectoris: Secondary | ICD-10-CM | POA: Diagnosis not present

## 2019-03-18 DIAGNOSIS — J440 Chronic obstructive pulmonary disease with acute lower respiratory infection: Secondary | ICD-10-CM | POA: Diagnosis not present

## 2019-03-18 DIAGNOSIS — R1310 Dysphagia, unspecified: Secondary | ICD-10-CM | POA: Diagnosis not present

## 2019-03-18 DIAGNOSIS — J181 Lobar pneumonia, unspecified organism: Secondary | ICD-10-CM | POA: Diagnosis not present

## 2019-03-18 DIAGNOSIS — I10 Essential (primary) hypertension: Secondary | ICD-10-CM | POA: Diagnosis not present

## 2019-03-18 DIAGNOSIS — R339 Retention of urine, unspecified: Secondary | ICD-10-CM | POA: Diagnosis not present

## 2019-03-21 DIAGNOSIS — J181 Lobar pneumonia, unspecified organism: Secondary | ICD-10-CM | POA: Diagnosis not present

## 2019-03-21 DIAGNOSIS — I2510 Atherosclerotic heart disease of native coronary artery without angina pectoris: Secondary | ICD-10-CM | POA: Diagnosis not present

## 2019-03-21 DIAGNOSIS — J440 Chronic obstructive pulmonary disease with acute lower respiratory infection: Secondary | ICD-10-CM | POA: Diagnosis not present

## 2019-03-21 DIAGNOSIS — F0390 Unspecified dementia without behavioral disturbance: Secondary | ICD-10-CM | POA: Diagnosis not present

## 2019-03-21 DIAGNOSIS — R339 Retention of urine, unspecified: Secondary | ICD-10-CM | POA: Diagnosis not present

## 2019-03-21 DIAGNOSIS — R1310 Dysphagia, unspecified: Secondary | ICD-10-CM | POA: Diagnosis not present

## 2019-03-21 DIAGNOSIS — I10 Essential (primary) hypertension: Secondary | ICD-10-CM | POA: Diagnosis not present

## 2019-03-21 DIAGNOSIS — L989 Disorder of the skin and subcutaneous tissue, unspecified: Secondary | ICD-10-CM | POA: Diagnosis not present

## 2019-03-21 DIAGNOSIS — J9621 Acute and chronic respiratory failure with hypoxia: Secondary | ICD-10-CM | POA: Diagnosis not present

## 2019-03-22 ENCOUNTER — Ambulatory Visit (INDEPENDENT_AMBULATORY_CARE_PROVIDER_SITE_OTHER): Payer: Medicare HMO | Admitting: Licensed Clinical Social Worker

## 2019-03-22 DIAGNOSIS — G3183 Dementia with Lewy bodies: Secondary | ICD-10-CM

## 2019-03-22 DIAGNOSIS — F419 Anxiety disorder, unspecified: Secondary | ICD-10-CM

## 2019-03-22 DIAGNOSIS — J410 Simple chronic bronchitis: Secondary | ICD-10-CM | POA: Diagnosis not present

## 2019-03-22 DIAGNOSIS — Z9181 History of falling: Secondary | ICD-10-CM

## 2019-03-22 DIAGNOSIS — F0281 Dementia in other diseases classified elsewhere with behavioral disturbance: Secondary | ICD-10-CM | POA: Diagnosis not present

## 2019-03-22 DIAGNOSIS — R54 Age-related physical debility: Secondary | ICD-10-CM

## 2019-03-22 NOTE — Chronic Care Management (AMB) (Signed)
  Care Management Note   [name] is a 85 y.o. year old female who is a primary care patient of [name], [name], FNP. The CM team was consulted for assistance with chronic disease management and care coordination.   I reached out to [employer] by phone today.   Review of patient status, including review of consultants reports, relevant laboratory and other test results, and collaboration with appropriate care team members and the patient's provider was performed as part of comprehensive patient evaluation and provision of chronic care management services.   Social Determinants of Health:risk for Depression; risk for Social Isolation    Chronic Care Management from 12/24/2018 in [place]  PHQ-9 Total Score  10     Goals        . Client to communicate with LCSW in next 30 days to discuss health needs management of client (pt-stated)     Current Barriers:  . ADL IADL limitations  . Weakness, fall potential . Needs help with ADLs  Clinical Social Work Clinical Goal(s):  Marland Kitchen Over the next 30 days, [name], son of client  will work with LCSW to address concerns related to health needs management for client  . Interventions: . Discussed health care needs of client . Encouraged client or son to talk with RN CM to discuss nursing needs of client . Encouraged client/son to communicate as needed with Baruch Gouty, FNP regarding medical needs of client . Encouraged client/son of client to call LCSW as needed to talk about psychosocial needs of client.   Patient Self Care Activities:  . Attends all scheduled provider appointments  Plan:   Client to attend scheduled medical appointments Client/son, [name], to communicate with RN CM as needed to discuss nursing needs of client LCSW to call client/son, [name], in next 3 weeks to discuss with client/son the management of health needs of client  Initial goal documentation    .   [name] , son of  client, said client had now received prescribed wheelchair.  He said she has several bed sores at present, on her sacrum and on her back. He said client had been receiving physical therapy.  He said nurse from home health is helping with client  wound care dressings weekly. [name] said his sister, [name], also helps with client needs.  Client is weak, and at risk for falls. Client is using catheter.[name] said client has difficulty getting up. She does get up occasionally and uses wheelchair; but client is weak, fatigued. [name] said client used Ensure to try to build protein but [name] said client  had diarrhea often when using Ensure. She has decreased appetite.  [name] is hoping physical therapy will continue with client., saying that physical therapy representative has not been to see client recently.   Follow Up Plan: LCSW to call [name] in next 3 weeks to talk with client/son about health needs of client.   Norva Riffle.Chaselynn Kepple MSW, LCSW Licensed Clinical Social Worker [facility] [phone]

## 2019-03-22 NOTE — Patient Instructions (Signed)
Licensed Clinical Social Worker Visit Information  Goals we discussed today:  Goals        . Client to communicate with LCSW in next 30 days to discuss health needs management of client (pt-stated)     Current Barriers:  . ADL IADL limitations  . Weakness, fall potential . Needs help with ADLs  Clinical Social Work Clinical Goal(s):  Marland Kitchen Over the next 30 days, client/[name], son of client  will work with LCSW to address concerns related to health needs management for client  . Interventions: . Discussed health care needs of client . Encouraged client or son to talk with RN CM to discuss nursing needs of client . Encouraged client/son to communicate as needed with Baruch Gouty, FNP regarding medical needs of client . Encouraged client/son of client to call LCSW as needed to talk about psychosocial needs of client.   Patient Self Care Activities:  . Attends all scheduled provider appointments  Plan:   Client to attend scheduled medical appointments Client/son, [name], to communicate with RN CM as needed to discuss nursing needs of client LCSW to call client/son, [name], in next 3 weeks to discuss with client/son the management of health needs of client  Initial goal documentation    Materials Provided: No  Follow Up Plan: LCSW to call client/son, [name], in next 3 weeks to discuss with client/son about the health needs of client  The patient/son [name], verbalized understanding of instructions provided today and declined a print copy of patient instruction materials.   Norva Riffle.Zayvien Canning MSW, LCSW Licensed Clinical Social Worker [facility] [phone]

## 2019-03-23 DIAGNOSIS — J440 Chronic obstructive pulmonary disease with acute lower respiratory infection: Secondary | ICD-10-CM | POA: Diagnosis not present

## 2019-03-23 DIAGNOSIS — R339 Retention of urine, unspecified: Secondary | ICD-10-CM | POA: Diagnosis not present

## 2019-03-23 DIAGNOSIS — J181 Lobar pneumonia, unspecified organism: Secondary | ICD-10-CM | POA: Diagnosis not present

## 2019-03-23 DIAGNOSIS — F0390 Unspecified dementia without behavioral disturbance: Secondary | ICD-10-CM | POA: Diagnosis not present

## 2019-03-23 DIAGNOSIS — R1310 Dysphagia, unspecified: Secondary | ICD-10-CM | POA: Diagnosis not present

## 2019-03-23 DIAGNOSIS — I10 Essential (primary) hypertension: Secondary | ICD-10-CM | POA: Diagnosis not present

## 2019-03-23 DIAGNOSIS — J9621 Acute and chronic respiratory failure with hypoxia: Secondary | ICD-10-CM | POA: Diagnosis not present

## 2019-03-23 DIAGNOSIS — I2510 Atherosclerotic heart disease of native coronary artery without angina pectoris: Secondary | ICD-10-CM | POA: Diagnosis not present

## 2019-03-23 DIAGNOSIS — L989 Disorder of the skin and subcutaneous tissue, unspecified: Secondary | ICD-10-CM | POA: Diagnosis not present

## 2019-03-24 DIAGNOSIS — R1310 Dysphagia, unspecified: Secondary | ICD-10-CM | POA: Diagnosis not present

## 2019-03-24 DIAGNOSIS — F0390 Unspecified dementia without behavioral disturbance: Secondary | ICD-10-CM | POA: Diagnosis not present

## 2019-03-24 DIAGNOSIS — J440 Chronic obstructive pulmonary disease with acute lower respiratory infection: Secondary | ICD-10-CM | POA: Diagnosis not present

## 2019-03-24 DIAGNOSIS — I2510 Atherosclerotic heart disease of native coronary artery without angina pectoris: Secondary | ICD-10-CM | POA: Diagnosis not present

## 2019-03-24 DIAGNOSIS — J181 Lobar pneumonia, unspecified organism: Secondary | ICD-10-CM | POA: Diagnosis not present

## 2019-03-24 DIAGNOSIS — J9621 Acute and chronic respiratory failure with hypoxia: Secondary | ICD-10-CM | POA: Diagnosis not present

## 2019-03-24 DIAGNOSIS — I10 Essential (primary) hypertension: Secondary | ICD-10-CM | POA: Diagnosis not present

## 2019-03-24 DIAGNOSIS — R339 Retention of urine, unspecified: Secondary | ICD-10-CM | POA: Diagnosis not present

## 2019-03-24 DIAGNOSIS — L989 Disorder of the skin and subcutaneous tissue, unspecified: Secondary | ICD-10-CM | POA: Diagnosis not present

## 2019-03-28 DIAGNOSIS — R339 Retention of urine, unspecified: Secondary | ICD-10-CM | POA: Diagnosis not present

## 2019-03-28 DIAGNOSIS — F0390 Unspecified dementia without behavioral disturbance: Secondary | ICD-10-CM | POA: Diagnosis not present

## 2019-03-28 DIAGNOSIS — J440 Chronic obstructive pulmonary disease with acute lower respiratory infection: Secondary | ICD-10-CM | POA: Diagnosis not present

## 2019-03-28 DIAGNOSIS — J181 Lobar pneumonia, unspecified organism: Secondary | ICD-10-CM | POA: Diagnosis not present

## 2019-03-28 DIAGNOSIS — I10 Essential (primary) hypertension: Secondary | ICD-10-CM | POA: Diagnosis not present

## 2019-03-28 DIAGNOSIS — I2510 Atherosclerotic heart disease of native coronary artery without angina pectoris: Secondary | ICD-10-CM | POA: Diagnosis not present

## 2019-03-28 DIAGNOSIS — L989 Disorder of the skin and subcutaneous tissue, unspecified: Secondary | ICD-10-CM | POA: Diagnosis not present

## 2019-03-28 DIAGNOSIS — R1310 Dysphagia, unspecified: Secondary | ICD-10-CM | POA: Diagnosis not present

## 2019-03-28 DIAGNOSIS — J9621 Acute and chronic respiratory failure with hypoxia: Secondary | ICD-10-CM | POA: Diagnosis not present

## 2019-03-29 DIAGNOSIS — R531 Weakness: Secondary | ICD-10-CM | POA: Diagnosis not present

## 2019-03-29 DIAGNOSIS — F0391 Unspecified dementia with behavioral disturbance: Secondary | ICD-10-CM | POA: Diagnosis not present

## 2019-03-29 DIAGNOSIS — S329XXA Fracture of unspecified parts of lumbosacral spine and pelvis, initial encounter for closed fracture: Secondary | ICD-10-CM | POA: Diagnosis not present

## 2019-04-03 ENCOUNTER — Other Ambulatory Visit: Payer: Self-pay | Admitting: Family Medicine

## 2019-04-03 DIAGNOSIS — F02818 Dementia in other diseases classified elsewhere, unspecified severity, with other behavioral disturbance: Secondary | ICD-10-CM

## 2019-04-03 DIAGNOSIS — E785 Hyperlipidemia, unspecified: Secondary | ICD-10-CM

## 2019-04-03 DIAGNOSIS — F0281 Dementia in other diseases classified elsewhere with behavioral disturbance: Secondary | ICD-10-CM

## 2019-04-04 DIAGNOSIS — F0390 Unspecified dementia without behavioral disturbance: Secondary | ICD-10-CM | POA: Diagnosis not present

## 2019-04-05 ENCOUNTER — Telehealth: Payer: Self-pay | Admitting: *Deleted

## 2019-04-05 ENCOUNTER — Other Ambulatory Visit: Payer: Self-pay | Admitting: Family Medicine

## 2019-04-05 ENCOUNTER — Ambulatory Visit: Payer: Medicare HMO | Admitting: Cardiology

## 2019-04-05 DIAGNOSIS — I2510 Atherosclerotic heart disease of native coronary artery without angina pectoris: Secondary | ICD-10-CM | POA: Diagnosis not present

## 2019-04-05 DIAGNOSIS — F0390 Unspecified dementia without behavioral disturbance: Secondary | ICD-10-CM | POA: Diagnosis not present

## 2019-04-05 DIAGNOSIS — J9621 Acute and chronic respiratory failure with hypoxia: Secondary | ICD-10-CM | POA: Diagnosis not present

## 2019-04-05 DIAGNOSIS — R1310 Dysphagia, unspecified: Secondary | ICD-10-CM | POA: Diagnosis not present

## 2019-04-05 DIAGNOSIS — L089 Local infection of the skin and subcutaneous tissue, unspecified: Secondary | ICD-10-CM

## 2019-04-05 DIAGNOSIS — J181 Lobar pneumonia, unspecified organism: Secondary | ICD-10-CM | POA: Diagnosis not present

## 2019-04-05 DIAGNOSIS — J440 Chronic obstructive pulmonary disease with acute lower respiratory infection: Secondary | ICD-10-CM | POA: Diagnosis not present

## 2019-04-05 DIAGNOSIS — L989 Disorder of the skin and subcutaneous tissue, unspecified: Secondary | ICD-10-CM | POA: Diagnosis not present

## 2019-04-05 DIAGNOSIS — I10 Essential (primary) hypertension: Secondary | ICD-10-CM | POA: Diagnosis not present

## 2019-04-05 DIAGNOSIS — R339 Retention of urine, unspecified: Secondary | ICD-10-CM | POA: Diagnosis not present

## 2019-04-05 MED ORDER — AMOXICILLIN-POT CLAVULANATE 875-125 MG PO TABS: 1.0000 | ORAL_TABLET | Freq: Two times a day (BID) | ORAL | 0 refills | Status: AC

## 2019-04-05 MED ORDER — SULFAMETHOXAZOLE-TRIMETHOPRIM 800-160 MG PO TABS: 1.0000 | ORAL_TABLET | Freq: Two times a day (BID) | ORAL | 0 refills | Status: AC

## 2019-04-05 NOTE — Telephone Encounter (Signed)
TC back to [facility] w/ [facility], aware ok to use Santel & abx sent pharmacy. Will contact family about getting her an appt

## 2019-04-05 NOTE — Telephone Encounter (Signed)
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

## 2019-04-05 NOTE — Telephone Encounter (Signed)
Yes they can use Santel. She really needs to be in a nursing home. I will send in RX to pharmacy. Family needs to get her into office to be seen. She has not followed up since discharge from the hospital.

## 2019-04-05 NOTE — Progress Notes (Deleted)
Cardiology Office Note  Date: 04/05/2019   ID: Suzanne MillinKatherine [name], DOB [birth year], MRN [id number]  PCP:  Sonny Mastersakes, Linda M, FNP  Cardiologist:  Nona DellSamuel Minna Dumire, MD Electrophysiologist:  None   No chief complaint on file.   History of Present Illness: Suzanne MillinKatherine [name] is an 85 y.o. female last seen in July 2019.  Record review finds hospitalization in June with sepsis in the setting of right lower lobe pneumonia complicated by acute on chronic hypoxic respiratory failure.  She did have episodes of SVT during hospital stay and was treated with calcium channel blocker, discharged on Cardizem CD 120 mg daily.  Follow-up echocardiogram as noted below indicating LVEF 60 to 65% range and at least mild aortic stenosis.  Past Medical History:  Diagnosis Date  . Anxiety   . COPD (chronic obstructive pulmonary disease) (HCC)   . Coronary atherosclerosis of native coronary artery    DES LAD and BMS LAD 8/08  . Esophageal stricture   . Essential hypertension, benign   . GERD (gastroesophageal reflux disease)   . MI (myocardial infarction) (HCC) 2008  . Mixed hyperlipidemia   . Osteoporosis   . Pelvic fracture (HCC) 08/2014  . Resting tremor   . Rickets     Past Surgical History:  Procedure Laterality Date  . APPENDECTOMY    . Bilateral cataract surgery    . CARDIAC CATHETERIZATION    . CHOLECYSTECTOMY    . CORONARY ANGIOPLASTY WITH STENT PLACEMENT    . Left Sacroiliac Joint Injection  11/27/11   DR. O'TOOLE  . Left Trochanteric Bursa Injection  11/27/11   DR. O'TOOLE  . VEIN LIGATION AND STRIPPING    . YAG LASER APPLICATION  09/06/2012   Procedure: YAG LASER APPLICATION;  Surgeon: Susa Simmondsarroll F Haines, MD;  Location: [facility];  Service: Ophthalmology;  Laterality: Left;    Current Outpatient Medications  Medication Sig Dispense Refill  . acetaminophen (TYLENOL) 325 MG tablet Take 2 tablets (650 mg total) by mouth every 6 (six) hours as needed for mild pain or fever (or Fever >/= 101). 12  tablet 1  . atorvastatin (LIPITOR) 20 MG tablet TAKE 1 TABLET BY MOUTH EVERY DAY 90 tablet 0  . Calcium Citrate-Vitamin D (CITRACAL PETITES/VITAMIN D) 200-250 MG-UNIT TABS Take 1 tablet by mouth 2 (two) times daily. 180 tablet 1  . clopidogrel (PLAVIX) 75 MG tablet Take 1 tablet (75 mg total) by mouth daily. 30 tablet 5  . diltiazem (CARDIZEM CD) 120 MG 24 hr capsule Take 1 capsule (120 mg total) by mouth daily. 30 capsule 4  . donepezil (ARICEPT) 10 MG tablet Take 1 tablet (10 mg total) by mouth at bedtime. 30 tablet 5  . furosemide (LASIX) 20 MG tablet Take 1 tablet (20 mg total) by mouth every Monday, Wednesday, and Friday. 30 tablet 3  . guaiFENesin (ROBITUSSIN) 100 MG/5ML liquid Take 20 mLs (400 mg total) by mouth 3 (three) times daily as needed for cough or congestion. 236 mL 0  . isosorbide mononitrate (IMDUR) 30 MG 24 hr tablet Take 1 tablet (30 mg total) by mouth daily. 30 tablet 2  . lisinopril (ZESTRIL) 5 MG tablet Take 1 tablet (5 mg total) by mouth daily. 30 tablet 5  . memantine (NAMENDA) 5 MG tablet Take 1 tablet (5 mg total) by mouth 2 (two) times daily. (Needs to be seen before next refill) 60 tablet 0  . nystatin cream (MYCOSTATIN) Apply 1 application topically 2 (two) times daily. 60 g 2  . ondansetron (  ZOFRAN-ODT) 4 MG disintegrating tablet Take 1 tablet (4 mg total) by mouth every 6 (six) hours as needed for nausea or vomiting. 30 tablet 0  . polyethylene glycol (MIRALAX / GLYCOLAX) 17 g packet Take 17 g by mouth daily as needed for mild constipation. 14 each 0  . risperiDONE (RISPERDAL) 0.25 MG tablet TAKE 1 TABLET BY MOUTH TWICE DAILY AS NEEDED FOR AGITATION 30 tablet 2  . tamsulosin (FLOMAX) 0.4 MG CAPS capsule Take 1 capsule (0.4 mg total) by mouth daily after supper. 30 capsule 0  . zoledronic acid (RECLAST) 5 MG/100ML SOLN injection Inject 5 mg into the vein PRO. Yearly x 5     No current facility-administered medications for this visit.    Allergies:  Azithromycin,  Cephalexin, Ciprofloxacin, Codeine, Contrast media [iodinated diagnostic agents], Latex, Nitrofuran derivatives, Nsaids, Nutritional supplements, Penicillins, Phenazopyridine, Quinolones, Sulfa antibiotics, Toviaz [fesoterodine fumarate er], and Trimethoprim   Social History: The patient  reports that she has never smoked. She has never used smokeless tobacco. She reports that she does not drink alcohol or use drugs.   Family History: The patient's family history includes Cancer in her father, sister, and son; Depression in her daughter; GER disease in her daughter; Healthy in her son; Hypertension in her daughter, son, and another family member; Obesity in her son; Peripheral Artery Disease in her son; Thyroid disease in her daughter; Tremor in her sister.   ROS:  Please see the history of present illness. Otherwise, complete review of systems is positive for {NONE DEFAULTED:18576::"none"}.  All other systems are reviewed and negative.   Physical Exam: VS:  There were no vitals taken for this visit., BMI There is no height or weight on file to calculate BMI.  Wt Readings from Last 3 Encounters:  02/11/19 131 lb 9.8 oz (59.7 kg)  12/21/18 131 lb (59.4 kg)  11/18/18 126 lb (57.2 kg)    General: Patient appears comfortable at rest. HEENT: Conjunctiva and lids normal, oropharynx clear with moist mucosa. Neck: Supple, no elevated JVP or carotid bruits, no thyromegaly. Lungs: Clear to auscultation, nonlabored breathing at rest. Cardiac: Regular rate and rhythm, no S3 or significant systolic murmur, no pericardial rub. Abdomen: Soft, nontender, no hepatomegaly, bowel sounds present, no guarding or rebound. Extremities: No pitting edema, distal pulses 2+. Skin: Warm and dry. Musculoskeletal: No kyphosis. Neuropsychiatric: Alert and oriented x3, affect grossly appropriate.  ECG:  An ECG dated 02/12/2019 was personally reviewed today and demonstrated:  SVT, possible long RP tachycardia with low  voltage and nonspecific ST changes.  Recent Labwork: 04/16/2018: TSH 3.610 02/10/2019: ALT 14; AST 19 02/13/2019: BUN 16; Creatinine, Ser 0.70; Magnesium 2.2; Potassium 4.0; Sodium 143 02/14/2019: Hemoglobin 10.3; Platelets 244     Component Value Date/Time   CHOL 146 11/09/2014 1523   TRIG 88 11/09/2014 1523   HDL 72 11/09/2014 1523   CHOLHDL 2.0 11/09/2014 1523   CHOLHDL 2.1 06/07/2012 0528   VLDL 17 06/07/2012 0528   LDLCALC 56 11/09/2014 1523    Other Studies Reviewed Today:  Echocardiogram 02/13/2019:  1. The left ventricle has normal systolic function with an ejection fraction of 60-65%. The cavity size was normal. There is mild concentric left ventricular hypertrophy. Diastolic dysfunction, grade indeterminate.. Elevated left ventricular  end-diastolic pressure No evidence of left ventricular regional wall motion abnormalities.  2. The right ventricle has normal systolic function. The cavity was normal. There is no increase in right ventricular wall thickness.  3. Mildly thickened tricuspid valve leaflets.  4.  The mitral valve is grossly normal. Mild thickening of the mitral valve leaflet. Small torn chordae. There is moderate mitral annular calcification present.  5. The tricuspid valve is grossly normal.  6. The aortic valve is tricuspid. Mild thickening of the aortic valve. Mild calcification of the aortic valve. Aortic valve regurgitation is mild by color flow Doppler. Morphologically, there appears to be at least mild calcific stenosis of the aortic  valve.  7. The aortic root is normal in size and structure.  Assessment and Plan:    Medication Adjustments/Labs and Tests Ordered: Current medicines are reviewed at length with the patient today.  Concerns regarding medicines are outlined above.   Tests Ordered: No orders of the defined types were placed in this encounter.   Medication Changes: No orders of the defined types were placed in this encounter.    Disposition:  Follow up {follow up:15908}  Signed, Jonelle SidleSamuel G. Paulina Muchmore, MD, [facility]ACC 04/05/2019 10:50 AM    [facility] [address], [place], [place] [address] Phone: [phone]) [phone]; Fax: [phone]

## 2019-04-08 ENCOUNTER — Telehealth: Payer: Self-pay | Admitting: *Deleted

## 2019-04-08 DIAGNOSIS — R339 Retention of urine, unspecified: Secondary | ICD-10-CM | POA: Diagnosis not present

## 2019-04-08 DIAGNOSIS — I10 Essential (primary) hypertension: Secondary | ICD-10-CM | POA: Diagnosis not present

## 2019-04-08 DIAGNOSIS — F0390 Unspecified dementia without behavioral disturbance: Secondary | ICD-10-CM | POA: Diagnosis not present

## 2019-04-08 DIAGNOSIS — J181 Lobar pneumonia, unspecified organism: Secondary | ICD-10-CM | POA: Diagnosis not present

## 2019-04-08 DIAGNOSIS — I2510 Atherosclerotic heart disease of native coronary artery without angina pectoris: Secondary | ICD-10-CM | POA: Diagnosis not present

## 2019-04-08 DIAGNOSIS — R1310 Dysphagia, unspecified: Secondary | ICD-10-CM | POA: Diagnosis not present

## 2019-04-08 DIAGNOSIS — J440 Chronic obstructive pulmonary disease with acute lower respiratory infection: Secondary | ICD-10-CM | POA: Diagnosis not present

## 2019-04-08 DIAGNOSIS — L989 Disorder of the skin and subcutaneous tissue, unspecified: Secondary | ICD-10-CM | POA: Diagnosis not present

## 2019-04-08 DIAGNOSIS — J9621 Acute and chronic respiratory failure with hypoxia: Secondary | ICD-10-CM | POA: Diagnosis not present

## 2019-04-08 NOTE — Telephone Encounter (Signed)
VM from [place] w/ [facility] Pt has stage 2 & 3 ulcers on sacrum Needs order for air mattress, fax to [phone] Also needs referral to Hospice, she is not eating or drinking Family is ok with referral

## 2019-04-10 NOTE — Telephone Encounter (Signed)
[name],  Does this still need to be completed? It states it is closed but B. Blanch [employer] routed it to my in basket.   Thanks, Sharyn Lull

## 2019-04-11 ENCOUNTER — Other Ambulatory Visit: Payer: Self-pay | Admitting: Family Medicine

## 2019-04-11 DIAGNOSIS — R54 Age-related physical debility: Secondary | ICD-10-CM | POA: Insufficient documentation

## 2019-04-11 DIAGNOSIS — L089 Local infection of the skin and subcutaneous tissue, unspecified: Secondary | ICD-10-CM | POA: Insufficient documentation

## 2019-04-11 DIAGNOSIS — J410 Simple chronic bronchitis: Secondary | ICD-10-CM

## 2019-04-11 DIAGNOSIS — R627 Adult failure to thrive: Secondary | ICD-10-CM | POA: Insufficient documentation

## 2019-04-11 DIAGNOSIS — L8994 Pressure ulcer of unspecified site, stage 4: Secondary | ICD-10-CM | POA: Insufficient documentation

## 2019-04-11 DIAGNOSIS — R531 Weakness: Secondary | ICD-10-CM

## 2019-04-11 DIAGNOSIS — F0280 Dementia in other diseases classified elsewhere without behavioral disturbance: Secondary | ICD-10-CM

## 2019-04-11 NOTE — Telephone Encounter (Signed)
Angie w/ [facility] aware Hospice order placed Air mattress order faxed

## 2019-04-11 NOTE — Telephone Encounter (Signed)
Please address, no further documentation or referrals were placed

## 2019-04-11 NOTE — Telephone Encounter (Signed)
Orders placed.

## 2019-04-11 NOTE — Progress Notes (Signed)
failutre

## 2019-04-12 ENCOUNTER — Ambulatory Visit (INDEPENDENT_AMBULATORY_CARE_PROVIDER_SITE_OTHER): Payer: Medicare HMO | Admitting: Licensed Clinical Social Worker

## 2019-04-12 DIAGNOSIS — F0281 Dementia in other diseases classified elsewhere with behavioral disturbance: Secondary | ICD-10-CM

## 2019-04-12 DIAGNOSIS — F419 Anxiety disorder, unspecified: Secondary | ICD-10-CM

## 2019-04-12 DIAGNOSIS — R531 Weakness: Secondary | ICD-10-CM

## 2019-04-12 DIAGNOSIS — J410 Simple chronic bronchitis: Secondary | ICD-10-CM | POA: Diagnosis not present

## 2019-04-12 DIAGNOSIS — Z9181 History of falling: Secondary | ICD-10-CM

## 2019-04-12 DIAGNOSIS — I10 Essential (primary) hypertension: Secondary | ICD-10-CM

## 2019-04-12 DIAGNOSIS — G3183 Dementia with Lewy bodies: Secondary | ICD-10-CM | POA: Diagnosis not present

## 2019-04-12 NOTE — Patient Instructions (Addendum)
Licensed Clinical Social Worker Visit Information  Goals we discussed today:   Goals        . Client to communicate with LCSW in next 30 days to discuss health needs management of client (pt-stated)     Current Barriers:  . ADL IADL limitations  . Weakness, fall potential . Needs help with ADLs  Clinical Social Work Clinical Goal(s):  Marland Kitchen Over the next 30 days, [name], son of client  will work with LCSW to address concerns related to health needs management for client .   Marland Kitchen Interventions:  Discussed health care needs of client   Discussed with [name], son of client, Hospice involvement for client (RN for Hospice is scheduled to visit client on 04/12/2019 to evaluate client needs  Patient Self Care Activities:  . Attends all scheduled provider appointments  Plan:   Client to attend scheduled medical appointments Client/son, [name], to communicate with RN CM as needed to discuss nursing needs of client LCSW to call client/son, [name], in next 3 weeks to discuss with client/son the management of health needs of client  Initial goal documentation     Materials Provided: No  Follow Up Plan: LCSW to call client/son, [name], in next 3 weeks to discuss with client/son the management of health needs of client  The patient/[name] [name], son of client,  verbalized understanding of instructions provided today and declined a print copy of patient instruction materials.   [name] MSW, LCSW Licensed Clinical Social Worker [facility] [phone]

## 2019-04-12 NOTE — Chronic Care Management (AMB) (Signed)
  Care Management Note   [name] is a 85 y.o. year old female who is a primary care patient of [name], [name], FNP. The CM team was consulted for assistance with chronic disease management and care coordination.   I reached out to Aristes, son of client, by phone today.   Review of patient status, including review of consultants reports, relevant laboratory and other test results, and collaboration with appropriate care team members and the patient's provider was performed as part of comprehensive patient evaluation and provision of chronic care management services.   Social Determinants of Health: risk of social isolation; risk of depression    Chronic Care Management from 12/24/2018 in [place]  PHQ-9 Total Score  10     Goals        . Client to communicate with LCSW in next 30 days to discuss health needs management of client (pt-stated)     Current Barriers:  . ADL IADL limitations  . Weakness, fall potential . Needs help with ADLs  Clinical Social Work Clinical Goal(s):  Marland Kitchen Over the next 30 days, [name], son of client  will work with LCSW to address concerns related to health needs management for client  . Interventions: . Discussed health care needs of client . Discussed with [name], son of client, Hospice involvement for client (RN for Hospice is scheduled to visit client on 04/12/2019 to evaluate client needs) .   Patient Self Care Activities:  . Attends all scheduled provider appointments  Plan:   Client to attend scheduled medical appointments Client/son, [name], to communicate with RN CM as needed to discuss nursing needs of client LCSW to call client/son, [name], in next 3 weeks to discuss with client/son the management of health needs of client  Initial goal documentation   [name] , son of client, said client had now received prescribed wheelchair. He said she has several bed sores at  present, on her sacrum and on her back. Client is scheduled to receive an air mattress to help with skin care. Client has been referred to receive Hospice care. [name] said that nurse from Hospice is scheduled to visit client today for evaluation  [name] said his sister, [name], also helps with client needs. Client is weak, and at risk for falls. Client is using catheter.[name] said client has difficulty getting up. She does get up occasionally and uses wheelchair; but client is weak, fatigued. [name] said client used Ensure to try to build protein but [name] said client had diarrhea often when using Ensure. She has decreased appetite. LCSW encouraged [name] to call LCSW as needed to discuss social work needs of client.  Follow Up Plan: LCSW to call client/son, [name], in next 3 weeks to discuss with client/son the management of health needs of client.  Norva Riffle.Alisyn Lequire MSW, LCSW Licensed Clinical Social Worker [facility] [phone]

## 2019-04-15 DIAGNOSIS — R531 Weakness: Secondary | ICD-10-CM | POA: Diagnosis not present

## 2019-04-15 DIAGNOSIS — Z9181 History of falling: Secondary | ICD-10-CM | POA: Diagnosis not present

## 2019-04-15 DIAGNOSIS — G3183 Dementia with Lewy bodies: Secondary | ICD-10-CM | POA: Diagnosis not present

## 2019-04-25 ENCOUNTER — Telehealth: Payer: Self-pay | Admitting: *Deleted

## 2019-04-25 NOTE — Telephone Encounter (Signed)
Calcium Citrate-Vit D Caplet-DENIED  Medicare has excluded OTC drug products from Part D coverage.

## 2019-04-25 NOTE — Telephone Encounter (Signed)
LM for family to call about calcium OSCAL - OTC.

## 2019-04-25 NOTE — Telephone Encounter (Signed)
She can get Oscal over the counter

## 2019-05-03 ENCOUNTER — Other Ambulatory Visit: Payer: Self-pay | Admitting: Family Medicine

## 2019-05-03 DIAGNOSIS — F0281 Dementia in other diseases classified elsewhere with behavioral disturbance: Secondary | ICD-10-CM

## 2019-05-03 DIAGNOSIS — G3183 Dementia with Lewy bodies: Secondary | ICD-10-CM

## 2019-05-05 ENCOUNTER — Ambulatory Visit (INDEPENDENT_AMBULATORY_CARE_PROVIDER_SITE_OTHER): Payer: Medicare Other | Admitting: Licensed Clinical Social Worker

## 2019-05-05 DIAGNOSIS — I10 Essential (primary) hypertension: Secondary | ICD-10-CM

## 2019-05-05 DIAGNOSIS — R531 Weakness: Secondary | ICD-10-CM

## 2019-05-05 DIAGNOSIS — F02818 Dementia in other diseases classified elsewhere, unspecified severity, with other behavioral disturbance: Secondary | ICD-10-CM

## 2019-05-05 DIAGNOSIS — Z9181 History of falling: Secondary | ICD-10-CM

## 2019-05-05 DIAGNOSIS — F0281 Dementia in other diseases classified elsewhere with behavioral disturbance: Secondary | ICD-10-CM

## 2019-05-05 DIAGNOSIS — G3183 Dementia with Lewy bodies: Secondary | ICD-10-CM | POA: Diagnosis not present

## 2019-05-05 DIAGNOSIS — F0390 Unspecified dementia without behavioral disturbance: Secondary | ICD-10-CM | POA: Diagnosis not present

## 2019-05-05 DIAGNOSIS — J410 Simple chronic bronchitis: Secondary | ICD-10-CM

## 2019-05-05 DIAGNOSIS — F419 Anxiety disorder, unspecified: Secondary | ICD-10-CM

## 2019-05-05 NOTE — Chronic Care Management (AMB) (Signed)
Care Management Note   [name] is a 85 y.o. year old female who is a primary care patient of [name], [name]. The CM team was consulted for assistance with chronic disease management and care coordination.   I reached out to [name]/son, [name] by phone today.   Review of patient status, including review of consultants reports, relevant laboratory and other test results, and collaboration with appropriate care team members and the patient's provider was performed as part of comprehensive patient evaluation and provision of chronic care management services.   Social Determinants of Health: risk for social isolation; risk for depression; risk for physical inactivity    Chronic Care Management from 12/24/2018 [facility]  PHQ-9 Total Score  10     Medications   New medications from outside sources are available for reconciliation   acetaminophen (TYLENOL) 325 MG tablet    atorvastatin (LIPITOR) 20 MG tablet    Calcium Citrate-Vitamin D (CITRACAL PETITES/VITAMIN D) 200-250 MG-UNIT TABS    clopidogrel (PLAVIX) 75 MG tablet    diltiazem (CARDIZEM CD) 120 MG 24 hr capsule    donepezil (ARICEPT) 10 MG tablet    furosemide (LASIX) 20 MG tablet    guaiFENesin (ROBITUSSIN) 100 MG/5ML liquid    isosorbide mononitrate (IMDUR) 30 MG 24 hr tablet    lisinopril (ZESTRIL) 5 MG tablet    memantine (NAMENDA) 5 MG tablet    nystatin cream (MYCOSTATIN)    ondansetron (ZOFRAN-ODT) 4 MG disintegrating tablet    polyethylene glycol (MIRALAX / GLYCOLAX) 17 g packet    risperiDONE (RISPERDAL) 0.25 MG tablet    tamsulosin (FLOMAX) 0.4 MG CAPS capsule    zoledronic acid (RECLAST) 5 MG/100ML SOLN injection     Goals    . Client to communicate with LCSW in next 30 days to discuss health needs management of client (pt-stated)     Current Barriers:  . ADL IADL limitations  . Weakness, fall potential . Needs help with ADLs  Clinical Social Work  Clinical Goal(s):  Marland Kitchen. Over the next 30 days, client/Suzanne, son of client  will work with LCSW to address concerns related to health needs management for client  . Interventions: . Previously discussed health care needs of client . Previously encouraged client or son to talk with RN CM to discuss nursing needs of client . Previously encouraged client/son to communicate as needed with [name], [name] regarding medical needs of client . Previously encouraged client/son of client to call LCSW as needed to talk about psychosocial needs of client.   Patient Self Care Activities:  . Attends all scheduled provider appointments  Plan:   Client to attend scheduled medical appointments Client/son, [name], to communicate with RN CM as needed to discuss nursing needs of client LCSW to call client/son, [name], in next 3 weeks to discuss with client/son the management of health needs of client  Initial goal documentation        [name] , son of client, said client had now received prescribed wheelchair. He said she has several bed sores at present, on her sacrum and on her back. Client is scheduled to receive an air mattress to help with skin care. [name] reported  that nurse was coming out to client home daily to dress wound areas for client as needed.  [name] said his sister, [name], also helps with client needs. Client is weak, and at risk for falls. Client is using catheter.[name] said client has difficulty  getting up. She does get up occasionally and uses wheelchair; but client is weak, fatigued. LCSW has encouraged [name] to call LCSW as needed to discuss social work needs of client.  Follow Up Plan: LCSW to call client/son, [name], in next 3 weeks to talk with client/son of client about management of health needs of client  [name].[name] MSW, LCSW Licensed Clinical Social Worker [facility] Care Management [phone]

## 2019-05-05 NOTE — Patient Instructions (Signed)
Licensed Clinical Social Worker Visit Information  Goals we discussed today:    Goals    .         Marland Kitchen Client to communicate with LCSW in next 30 days to discuss health needs management of client (pt-stated)     Current Barriers:  . ADL IADL limitations  . Weakness, fall potential . Needs help with ADLs  Clinical Social Work Clinical Goal(s):  Marland Kitchen Over the next 30 days, [name], son of client  will work with LCSW to address concerns related to health needs management for client  . Interventions: . Previously discussed health care needs of client . Previously encouraged client or son to talk with RN CM to discuss nursing needs of client . Previously encouraged client/son to communicate as needed with Baruch Gouty, FNP regarding medical needs of client . Previously encouraged client/son of client to call LCSW as needed to talk about psychosocial needs of client.   Patient Self Care Activities:  . Attends all scheduled provider appointments  Plan:   Client to attend scheduled medical appointments Client/son, [name], to communicate with RN CM as needed to discuss nursing needs of client LCSW to call client/son, [name], in next 3 weeks to discuss with client/son the management of health needs of client  Initial goal documentation           Materials Provided: No  Follow Up Plan:  LCSW to call client/son, [name], in next 3 weeks to discuss with client/son the management of health needs of client  The patient/son, [name] verbalized understanding of instructions provided today and declined a print copy of patient instruction materials.   [name] MSW, LCSW Licensed Clinical Social Worker [facility] Care Management [phone]

## 2019-05-11 ENCOUNTER — Other Ambulatory Visit: Payer: Self-pay | Admitting: Family Medicine

## 2019-05-16 DIAGNOSIS — G3183 Dementia with Lewy bodies: Secondary | ICD-10-CM | POA: Diagnosis not present

## 2019-05-16 DIAGNOSIS — R531 Weakness: Secondary | ICD-10-CM | POA: Diagnosis not present

## 2019-05-16 DIAGNOSIS — Z9181 History of falling: Secondary | ICD-10-CM | POA: Diagnosis not present

## 2019-05-31 ENCOUNTER — Other Ambulatory Visit: Payer: Self-pay | Admitting: Family Medicine

## 2019-05-31 ENCOUNTER — Ambulatory Visit: Payer: Self-pay | Admitting: Licensed Clinical Social Worker

## 2019-05-31 DIAGNOSIS — Z9181 History of falling: Secondary | ICD-10-CM

## 2019-05-31 DIAGNOSIS — F419 Anxiety disorder, unspecified: Secondary | ICD-10-CM

## 2019-05-31 DIAGNOSIS — F0281 Dementia in other diseases classified elsewhere with behavioral disturbance: Secondary | ICD-10-CM

## 2019-05-31 DIAGNOSIS — R531 Weakness: Secondary | ICD-10-CM

## 2019-05-31 DIAGNOSIS — F02818 Dementia in other diseases classified elsewhere, unspecified severity, with other behavioral disturbance: Secondary | ICD-10-CM

## 2019-05-31 DIAGNOSIS — J410 Simple chronic bronchitis: Secondary | ICD-10-CM

## 2019-05-31 DIAGNOSIS — G3183 Dementia with Lewy bodies: Secondary | ICD-10-CM

## 2019-05-31 NOTE — Chronic Care Management (AMB) (Signed)
Care Management Note   [name] is a 85 y.o. year old female who is a primary care patient of [name], Doralee Albino, FNP. The CM team was consulted for assistance with chronic disease management and care coordination.   I reached out to [employer] by phone today.    Review of patient status, including review of consultants reports, relevant laboratory and other test results, and collaboration with appropriate care team members and the patient's provider was performed as part of comprehensive patient evaluation and provision of chronic care management services.   Social Determinants of Health: risk of depression; risk of social isolation    Chronic Care Management from 12/24/2018 in [facility]  PHQ-9 Total Score  10     Medications   New medications from outside sources are available for reconciliation   acetaminophen (TYLENOL) 325 MG tablet    atorvastatin (LIPITOR) 20 MG tablet    Calcium Citrate-Vitamin D (CITRACAL PETITES/VITAMIN D) 200-250 MG-UNIT TABS    clopidogrel (PLAVIX) 75 MG tablet    diltiazem (CARDIZEM CD) 120 MG 24 hr capsule    donepezil (ARICEPT) 10 MG tablet    furosemide (LASIX) 20 MG tablet    guaiFENesin (ROBITUSSIN) 100 MG/5ML liquid    isosorbide mononitrate (IMDUR) 30 MG 24 hr tablet    lisinopril (ZESTRIL) 5 MG tablet    memantine (NAMENDA) 5 MG tablet    nystatin cream (MYCOSTATIN)    ondansetron (ZOFRAN-ODT) 4 MG disintegrating tablet    polyethylene glycol (MIRALAX / GLYCOLAX) 17 g packet    risperiDONE (RISPERDAL) 0.25 MG tablet    tamsulosin (FLOMAX) 0.4 MG CAPS capsule    zoledronic acid (RECLAST) 5 MG/100ML SOLN injection    Goals        . Client to communicate with LCSW in next 30 days to discuss health needs management of client (pt-stated)     Current Barriers:  . ADL IADL limitations  . Weakness, fall potential . Needs help with ADLs  Clinical Social Work Clinical Goal(s):  Marland Kitchen Over the next 30 days,  [name], son of client  will work with LCSW to address concerns related to health needs management for client  . Interventions: . Discussed health care needs of client . Talked with [name], son of client, about Hospice in home support for client . Previously encouraged client/son to communicate as needed with Suzanne Masters, FNP regarding medical needs of client . Previously encouraged client/son of client to call LCSW as needed to talk about psychosocial needs of client.   Patient Self Care Activities:  . Attends all scheduled provider appointments  Plan:   Client to attend scheduled medical appointments Client/son, [name], to communicate with RN CM as needed to discuss nursing needs of client LCSW to call client/son, [name], in next 3 weeks to discuss with client/son the management of health needs of client  Initial goal documentation    .      [name] , son of client, previously said client had received prescribed wheelchair. He said she has several bed sores at present, on her sacrum and on her back.Client has air mattress which she uses on her bed. [name] reported  that nurse was coming out to client home daily to dress wound areas for client as needed. [name] said his sister, [name], also helps with client needs. Client is weak, and at risk for falls. Client is using catheter.[name] said client has difficulty getting up. LCSW has encouraged [name] to call LCSW as  needed to discuss social work needs of client. [name] was appreciative of call from LCSW on 05/31/2019  Follow Up Plan: LCSW to call client/son, [name], in the next 3 weeks to discuss with client/son the management of health needs of client  [name].[name] MSW, LCSW Licensed Clinical Social Worker [facility] Care Management [phone]

## 2019-05-31 NOTE — Patient Instructions (Signed)
Licensed Clinical Social Worker Visit Information  Goals we discussed today:  Goals    .     Marland Kitchen Client to communicate with LCSW in next 30 days to discuss health needs management of client (pt-stated)     Current Barriers:  . ADL IADL limitations  . Weakness, fall potential . Needs help with ADLs  Clinical Social Work Clinical Goal(s):  Marland Kitchen Over the next 30 days, client/[name], son of client  will work with LCSW to address concerns related to health needs management for client  . Interventions: . Discussed health care needs of client . Talked with [name] of client, about Hospice in home support for client . Previously encouraged client/son to communicate as needed with Baruch Gouty, FNP regarding medical needs of client . Previously encouraged client/son of client to call LCSW as needed to talk about psychosocial needs of client.   Patient Self Care Activities:  . Attends all scheduled provider appointments  Plan:   Client to attend scheduled medical appointments Client/son, [name], to communicate with RN CM as needed to discuss nursing needs of client LCSW to call client/son, [name], in next 3 weeks to discuss with client/son the management of health needs of client  Initial goal documentation    .         Materials Provided: No  Follow Up Plan:  LCSW to call client/son, [name], in next 3 weeks to discuss with client/son the management of health needs of client  The patient [name], [name], verbalized understanding of instructions provided today and declined a print copy of patient instruction materials.   Norva Riffle.[name] MSW, LCSW Licensed Clinical Social Worker [facility] Care [facility] [phone]

## 2019-05-31 NOTE — Telephone Encounter (Signed)
Please advise 

## 2019-06-21 ENCOUNTER — Telehealth: Payer: Self-pay

## 2019-07-03 DEATH — deceased

## 2019-09-26 ENCOUNTER — Encounter (HOSPITAL_COMMUNITY): Payer: Medicare HMO

## 2021-04-04 IMAGING — CR PORTABLE CHEST - 1 VIEW
1 series · 2 of 2 positions shown · non-contrast
Comparison: One-view chest x-ray 02/10/2019.

CLINICAL DATA: Shortness of breath.  Cough and fever.

EXAM:
PORTABLE CHEST 1 VIEW

[Series 1: portable · 0.17mm/px · 2 of 2 slices shown]
[im 1/2]
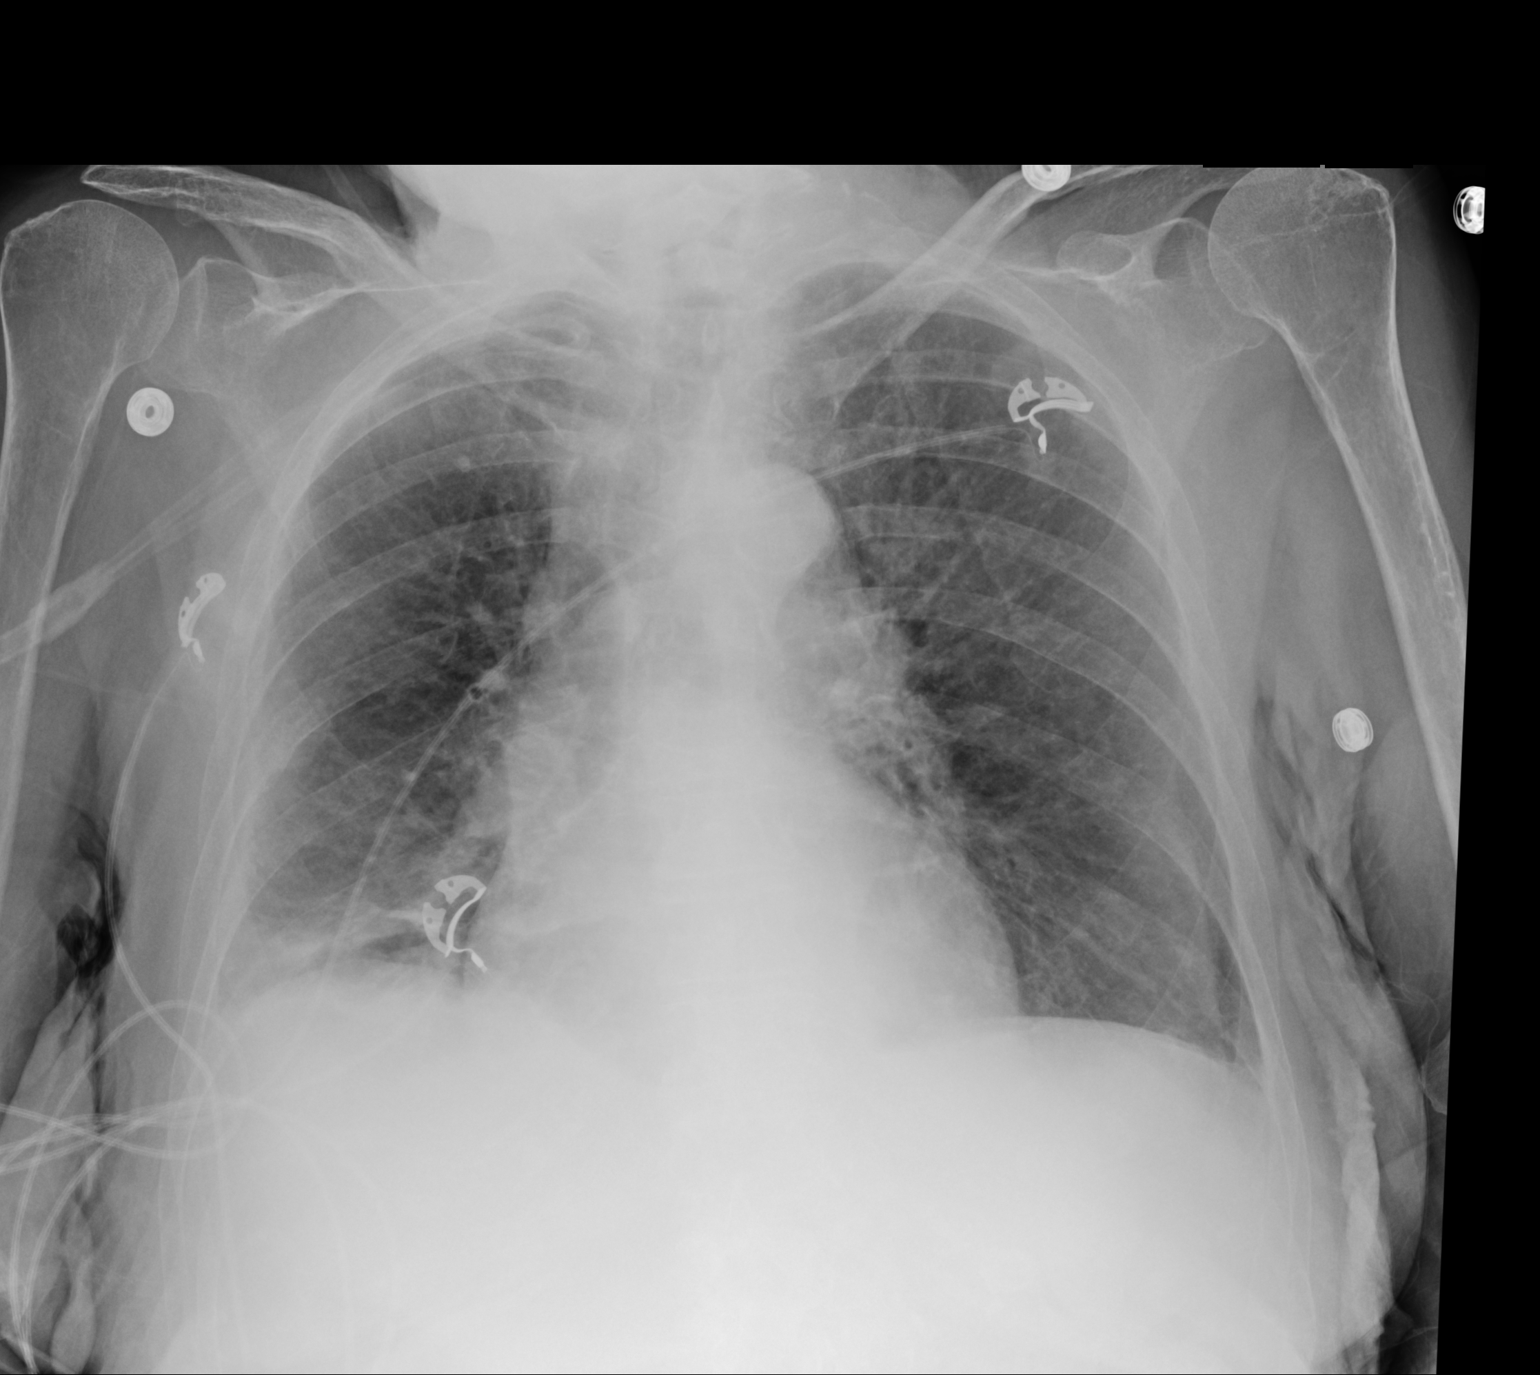
[im 2/2]
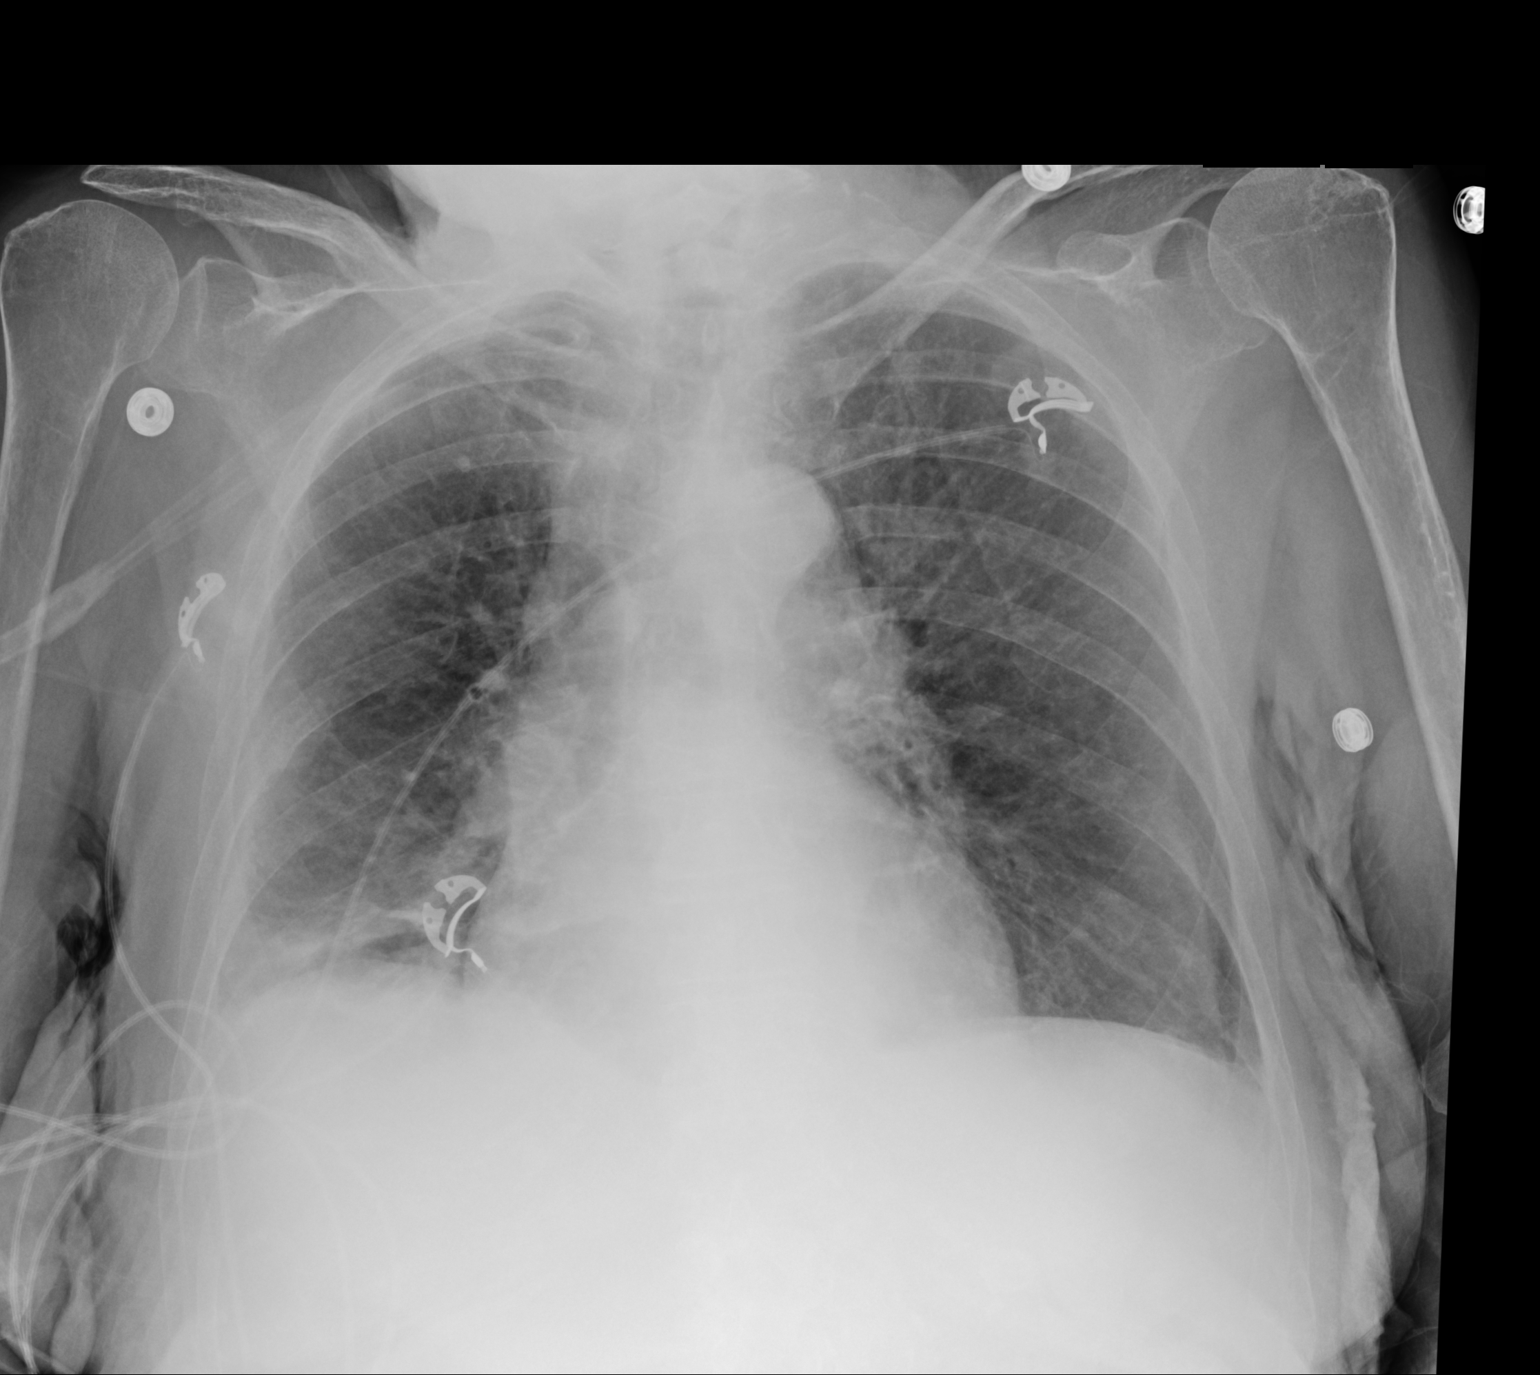

[2 of 2 positions shown; findings below may reference images not displayed]

FINDINGS: The heart size is normal. Lung volumes are low. Right lower lobe
airspace opacity is again noted. No other focal consolidation is
present. A small right effusion is suspected.
IMPRESSION: 1. Asymmetric right lower lobe airspace disease and probable
effusion. This is concerning for early infection.
